# Patient Record
Sex: Male | Born: 1950 | Race: White | Hispanic: No | Marital: Single | Smoking: Current every day smoker
Health system: Southern US, Community
[De-identification: ages and names within clinical notes are randomized; demographics above are authoritative.]

## PROBLEM LIST (undated history)

## (undated) DIAGNOSIS — I429 Cardiomyopathy, unspecified: Secondary | ICD-10-CM

## (undated) DIAGNOSIS — G43909 Migraine, unspecified, not intractable, without status migrainosus: Secondary | ICD-10-CM

## (undated) DIAGNOSIS — I509 Heart failure, unspecified: Secondary | ICD-10-CM

## (undated) DIAGNOSIS — I4891 Unspecified atrial fibrillation: Secondary | ICD-10-CM

## (undated) DIAGNOSIS — I739 Peripheral vascular disease, unspecified: Secondary | ICD-10-CM

## (undated) DIAGNOSIS — J189 Pneumonia, unspecified organism: Secondary | ICD-10-CM

## (undated) HISTORY — DX: Cardiomyopathy, unspecified: I42.9

## (undated) HISTORY — PX: NO PAST SURGERIES: SHX2092

## (undated) HISTORY — DX: Peripheral vascular disease, unspecified: I73.9

---
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
## 2014-07-10 DIAGNOSIS — J189 Pneumonia, unspecified organism: Secondary | ICD-10-CM

## 2014-07-10 HISTORY — DX: Pneumonia, unspecified organism: J18.9

## 2014-08-10 ENCOUNTER — Telehealth: Payer: Self-pay | Admitting: Family Medicine

## 2014-08-13 ENCOUNTER — Encounter: Payer: Self-pay | Admitting: Family Medicine

## 2014-08-13 ENCOUNTER — Encounter (HOSPITAL_COMMUNITY): Payer: Self-pay

## 2014-08-13 ENCOUNTER — Emergency Department (HOSPITAL_COMMUNITY): Payer: BLUE CROSS/BLUE SHIELD

## 2014-08-13 ENCOUNTER — Inpatient Hospital Stay (HOSPITAL_COMMUNITY)
Admission: EM | Admit: 2014-08-13 | Discharge: 2014-08-21 | DRG: 981 | Disposition: A | Discharge status: Home / Self Care | Payer: BLUE CROSS/BLUE SHIELD | Attending: Internal Medicine | Admitting: Internal Medicine

## 2014-08-13 ENCOUNTER — Inpatient Hospital Stay (HOSPITAL_COMMUNITY): Payer: BLUE CROSS/BLUE SHIELD

## 2014-08-13 ENCOUNTER — Ambulatory Visit (INDEPENDENT_AMBULATORY_CARE_PROVIDER_SITE_OTHER): Payer: BLUE CROSS/BLUE SHIELD | Admitting: Family Medicine

## 2014-08-13 VITALS — BP 172/86 | HR 115 | Temp 97.6°F | Ht 69.0 in | Wt 205.0 lb

## 2014-08-13 DIAGNOSIS — I4891 Unspecified atrial fibrillation: Secondary | ICD-10-CM | POA: Diagnosis present

## 2014-08-13 DIAGNOSIS — I499 Cardiac arrhythmia, unspecified: Secondary | ICD-10-CM

## 2014-08-13 DIAGNOSIS — R739 Hyperglycemia, unspecified: Secondary | ICD-10-CM | POA: Diagnosis present

## 2014-08-13 DIAGNOSIS — J189 Pneumonia, unspecified organism: Secondary | ICD-10-CM | POA: Diagnosis present

## 2014-08-13 DIAGNOSIS — I2510 Atherosclerotic heart disease of native coronary artery without angina pectoris: Secondary | ICD-10-CM | POA: Diagnosis present

## 2014-08-13 DIAGNOSIS — J441 Chronic obstructive pulmonary disease with (acute) exacerbation: Secondary | ICD-10-CM | POA: Diagnosis present

## 2014-08-13 DIAGNOSIS — J90 Pleural effusion, not elsewhere classified: Secondary | ICD-10-CM | POA: Diagnosis present

## 2014-08-13 DIAGNOSIS — Z720 Tobacco use: Secondary | ICD-10-CM

## 2014-08-13 DIAGNOSIS — E876 Hypokalemia: Secondary | ICD-10-CM | POA: Diagnosis not present

## 2014-08-13 DIAGNOSIS — I5021 Acute systolic (congestive) heart failure: Secondary | ICD-10-CM | POA: Diagnosis present

## 2014-08-13 DIAGNOSIS — R600 Localized edema: Secondary | ICD-10-CM | POA: Diagnosis present

## 2014-08-13 DIAGNOSIS — Z791 Long term (current) use of non-steroidal anti-inflammatories (NSAID): Secondary | ICD-10-CM

## 2014-08-13 DIAGNOSIS — J9601 Acute respiratory failure with hypoxia: Secondary | ICD-10-CM | POA: Diagnosis present

## 2014-08-13 DIAGNOSIS — Z79899 Other long term (current) drug therapy: Secondary | ICD-10-CM | POA: Diagnosis not present

## 2014-08-13 DIAGNOSIS — Z8249 Family history of ischemic heart disease and other diseases of the circulatory system: Secondary | ICD-10-CM

## 2014-08-13 DIAGNOSIS — I509 Heart failure, unspecified: Secondary | ICD-10-CM | POA: Diagnosis not present

## 2014-08-13 DIAGNOSIS — T502X5A Adverse effect of carbonic-anhydrase inhibitors, benzothiadiazides and other diuretics, initial encounter: Secondary | ICD-10-CM | POA: Diagnosis not present

## 2014-08-13 DIAGNOSIS — I5023 Acute on chronic systolic (congestive) heart failure: Secondary | ICD-10-CM | POA: Diagnosis present

## 2014-08-13 DIAGNOSIS — Z9889 Other specified postprocedural states: Secondary | ICD-10-CM

## 2014-08-13 DIAGNOSIS — F17210 Nicotine dependence, cigarettes, uncomplicated: Secondary | ICD-10-CM | POA: Diagnosis present

## 2014-08-13 DIAGNOSIS — F17200 Nicotine dependence, unspecified, uncomplicated: Secondary | ICD-10-CM | POA: Diagnosis present

## 2014-08-13 HISTORY — DX: Pneumonia, unspecified organism: J18.9

## 2014-08-13 HISTORY — DX: Migraine, unspecified, not intractable, without status migrainosus: G43.909

## 2014-08-13 HISTORY — DX: Unspecified atrial fibrillation: I48.91

## 2014-08-13 HISTORY — DX: Heart failure, unspecified: I50.9

## 2014-08-13 LAB — COMPREHENSIVE METABOLIC PANEL
ALBUMIN: 2.6 g/dL — AB (ref 3.5–5.2)
ALT: 41 U/L (ref 0–53)
AST: 38 U/L — ABNORMAL HIGH (ref 0–37)
Alkaline Phosphatase: 76 U/L (ref 39–117)
Anion gap: 6 (ref 5–15)
BUN: 13 mg/dL (ref 6–23)
CHLORIDE: 100 mmol/L (ref 96–112)
CO2: 31 mmol/L (ref 19–32)
CREATININE: 0.84 mg/dL (ref 0.50–1.35)
Calcium: 8.5 mg/dL (ref 8.4–10.5)
GFR calc Af Amer: >90 mL/min (ref >90)
Glucose, Bld: 94 mg/dL (ref 70–99)
POTASSIUM: 4.4 mmol/L (ref 3.5–5.1)
Sodium: 137 mmol/L (ref 135–145)
TOTAL PROTEIN: 6.7 g/dL (ref 6.0–8.3)
Total Bilirubin: 0.8 mg/dL (ref 0.3–1.2)

## 2014-08-13 LAB — URINALYSIS, ROUTINE W REFLEX MICROSCOPIC
Bilirubin Urine: NEGATIVE
Glucose, UA: NEGATIVE mg/dL
Hgb urine dipstick: NEGATIVE
KETONES UR: NEGATIVE mg/dL
Leukocytes, UA: NEGATIVE
Nitrite: NEGATIVE
Protein, ur: NEGATIVE mg/dL
SPECIFIC GRAVITY, URINE: 1.01 (ref 1.005–1.030)
Urobilinogen, UA: 0.2 mg/dL (ref 0.0–1.0)
pH: 7.5 (ref 5.0–8.0)

## 2014-08-13 LAB — CBC WITH DIFFERENTIAL/PLATELET
BASOS ABS: 0 10*3/uL (ref 0.0–0.1)
Basophils Relative: 0 % (ref 0–1)
EOS ABS: 0.2 10*3/uL (ref 0.0–0.7)
Eosinophils Relative: 2 % (ref 0–5)
HCT: 44.5 % (ref 39.0–52.0)
Hemoglobin: 14.1 g/dL (ref 13.0–17.0)
Lymphocytes Relative: 13 % (ref 12–46)
Lymphs Abs: 1.4 10*3/uL (ref 0.7–4.0)
MCH: 31.2 pg (ref 26.0–34.0)
MCHC: 31.7 g/dL (ref 30.0–36.0)
MCV: 98.5 fL (ref 78.0–100.0)
MONO ABS: 1.1 10*3/uL — AB (ref 0.1–1.0)
Monocytes Relative: 10 % (ref 3–12)
Neutro Abs: 8.2 10*3/uL — ABNORMAL HIGH (ref 1.7–7.7)
Neutrophils Relative %: 75 % (ref 43–77)
Platelets: 260 10*3/uL (ref 150–400)
RBC: 4.52 MIL/uL (ref 4.22–5.81)
RDW: 12.8 % (ref 11.5–15.5)
WBC: 10.9 10*3/uL — ABNORMAL HIGH (ref 4.0–10.5)

## 2014-08-13 LAB — MRSA PCR SCREENING: MRSA by PCR: NEGATIVE

## 2014-08-13 LAB — HEPARIN LEVEL (UNFRACTIONATED): Heparin Unfractionated: 0.33 IU/mL (ref 0.30–0.70)

## 2014-08-13 LAB — STREP PNEUMONIAE URINARY ANTIGEN: STREP PNEUMO URINARY ANTIGEN: NEGATIVE

## 2014-08-13 LAB — CREATININE, SERUM
CREATININE: 0.92 mg/dL (ref 0.50–1.35)
GFR calc Af Amer: >90 mL/min (ref >90)
GFR calc non Af Amer: 89 mL/min — ABNORMAL LOW (ref >90)

## 2014-08-13 LAB — CBC
HCT: 45.1 % (ref 39.0–52.0)
Hemoglobin: 14.3 g/dL (ref 13.0–17.0)
MCH: 31 pg (ref 26.0–34.0)
MCHC: 31.7 g/dL (ref 30.0–36.0)
MCV: 97.8 fL (ref 78.0–100.0)
PLATELETS: 244 10*3/uL (ref 150–400)
RBC: 4.61 MIL/uL (ref 4.22–5.81)
RDW: 12.8 % (ref 11.5–15.5)
WBC: 10.7 10*3/uL — ABNORMAL HIGH (ref 4.0–10.5)

## 2014-08-13 LAB — I-STAT CG4 LACTIC ACID, ED
LACTIC ACID, VENOUS: 1.07 mmol/L (ref 0.5–2.0)
Lactic Acid, Venous: 1.46 mmol/L (ref 0.5–2.0)

## 2014-08-13 LAB — BRAIN NATRIURETIC PEPTIDE: B Natriuretic Peptide: 1223.4 pg/mL — ABNORMAL HIGH (ref 0.0–100.0)

## 2014-08-13 LAB — TSH: TSH: 3.034 u[IU]/mL (ref 0.350–4.500)

## 2014-08-13 LAB — PROCALCITONIN: Procalcitonin: <0.1 ng/mL

## 2014-08-13 MED ORDER — DILTIAZEM HCL 100 MG IV SOLR
5.0000 mg/h | INTRAVENOUS | Status: DC
  Administered 2014-08-13: 12.5 mg/h via INTRAVENOUS
  Administered 2014-08-14 (×2): 10 mg/h via INTRAVENOUS
  Filled 2014-08-13: qty 100

## 2014-08-13 MED ORDER — AZITHROMYCIN 500 MG PO TABS
500.0000 mg | ORAL_TABLET | ORAL | Status: DC
  Administered 2014-08-14: 500 mg via ORAL
  Filled 2014-08-13: qty 1

## 2014-08-13 MED ORDER — DEXTROSE 5 % IV SOLN: 1.0000 g | INTRAVENOUS | Status: DC

## 2014-08-13 MED ORDER — FUROSEMIDE 10 MG/ML IJ SOLN
40.0000 mg | Freq: Once | INTRAMUSCULAR | Status: AC
  Administered 2014-08-14: 40 mg via INTRAVENOUS

## 2014-08-13 MED ORDER — VANCOMYCIN HCL IN DEXTROSE 1-5 GM/200ML-% IV SOLN
1000.0000 mg | Freq: Three times a day (TID) | INTRAVENOUS | Status: DC
  Administered 2014-08-13 – 2014-08-21 (×22): 1000 mg via INTRAVENOUS
  Filled 2014-08-13 (×28): qty 200

## 2014-08-13 MED ORDER — DEXTROSE 5 % IV SOLN
500.0000 mg | Freq: Once | INTRAVENOUS | Status: AC
  Administered 2014-08-13: 500 mg via INTRAVENOUS
  Filled 2014-08-13: qty 500

## 2014-08-13 MED ORDER — IOHEXOL 300 MG/ML  SOLN
100.0000 mL | Freq: Once | INTRAMUSCULAR | Status: AC | PRN
  Administered 2014-08-13: 100 mL via INTRAVENOUS

## 2014-08-13 MED ORDER — DEXTROSE 5 % IV SOLN
1.0000 g | Freq: Once | INTRAVENOUS | Status: AC
  Administered 2014-08-13: 1 g via INTRAVENOUS
  Filled 2014-08-13: qty 10

## 2014-08-13 MED ORDER — DEXTROSE 5 % IV SOLN: 500.0000 mg | INTRAVENOUS | Status: DC

## 2014-08-13 MED ORDER — HEPARIN SODIUM (PORCINE) 5000 UNIT/ML IJ SOLN: 5000.0000 [IU] | Freq: Three times a day (TID) | INTRAMUSCULAR | Status: DC

## 2014-08-13 MED ORDER — DEXTROSE 5 % IV SOLN
5.0000 mg/h | Freq: Once | INTRAVENOUS | Status: AC
  Administered 2014-08-13: 5 mg/h via INTRAVENOUS

## 2014-08-13 MED ORDER — FUROSEMIDE 10 MG/ML IJ SOLN
40.0000 mg | Freq: Two times a day (BID) | INTRAMUSCULAR | Status: DC
  Administered 2014-08-14 – 2014-08-15 (×2): 40 mg via INTRAVENOUS
  Filled 2014-08-13 (×5): qty 4

## 2014-08-13 MED ORDER — SODIUM CHLORIDE 0.9 % IV SOLN
INTRAVENOUS | Status: DC
  Administered 2014-08-13 – 2014-08-14 (×2): via INTRAVENOUS

## 2014-08-13 MED ORDER — HEPARIN BOLUS VIA INFUSION
4000.0000 [IU] | Freq: Once | INTRAVENOUS | Status: AC
  Administered 2014-08-13: 4000 [IU] via INTRAVENOUS
  Filled 2014-08-13: qty 4000

## 2014-08-13 MED ORDER — HEPARIN (PORCINE) IN NACL 100-0.45 UNIT/ML-% IJ SOLN
1400.0000 [IU]/h | INTRAMUSCULAR | Status: DC
  Administered 2014-08-13 (×2): 1300 [IU]/h via INTRAVENOUS
  Administered 2014-08-14: 1550 [IU]/h via INTRAVENOUS
  Administered 2014-08-15 (×2): 1650 [IU]/h via INTRAVENOUS
  Administered 2014-08-16: 1500 [IU]/h via INTRAVENOUS
  Administered 2014-08-17: 1400 [IU]/h via INTRAVENOUS
  Filled 2014-08-13 (×11): qty 250

## 2014-08-13 MED ORDER — PIPERACILLIN-TAZOBACTAM 3.375 G IVPB 30 MIN
3.3750 g | Freq: Once | INTRAVENOUS | Status: AC
  Administered 2014-08-13: 3.375 g via INTRAVENOUS
  Filled 2014-08-13: qty 50

## 2014-08-13 MED ORDER — METHYLPREDNISOLONE SODIUM SUCC 125 MG IJ SOLR
60.0000 mg | Freq: Three times a day (TID) | INTRAMUSCULAR | Status: DC
  Administered 2014-08-13 – 2014-08-14 (×4): 60 mg via INTRAVENOUS
  Filled 2014-08-13: qty 0.96
  Filled 2014-08-13: qty 2
  Filled 2014-08-13: qty 0.96
  Filled 2014-08-13: qty 2
  Filled 2014-08-13: qty 0.96
  Filled 2014-08-13: qty 2

## 2014-08-13 MED ORDER — DILTIAZEM HCL 25 MG/5ML IV SOLN
20.0000 mg | Freq: Once | INTRAVENOUS | Status: AC
  Administered 2014-08-13: 20 mg via INTRAVENOUS

## 2014-08-13 MED ORDER — VANCOMYCIN HCL IN DEXTROSE 1-5 GM/200ML-% IV SOLN
1000.0000 mg | Freq: Once | INTRAVENOUS | Status: AC
  Administered 2014-08-13: 1000 mg via INTRAVENOUS
  Filled 2014-08-13: qty 200

## 2014-08-13 MED ORDER — NICOTINE 21 MG/24HR TD PT24
21.0000 mg | MEDICATED_PATCH | Freq: Every day | TRANSDERMAL | Status: DC
  Administered 2014-08-14: 21 mg via TRANSDERMAL
  Filled 2014-08-13 (×8): qty 1

## 2014-08-13 MED ORDER — CEFTRIAXONE SODIUM IN DEXTROSE 20 MG/ML IV SOLN
1.0000 g | INTRAVENOUS | Status: DC
  Administered 2014-08-14: 1 g via INTRAVENOUS
  Filled 2014-08-13: qty 50

## 2014-08-13 MED ORDER — LEVALBUTEROL HCL 0.63 MG/3ML IN NEBU
0.6300 mg | INHALATION_SOLUTION | Freq: Three times a day (TID) | RESPIRATORY_TRACT | Status: DC
  Administered 2014-08-13 – 2014-08-14 (×4): 0.63 mg via RESPIRATORY_TRACT
  Filled 2014-08-13 (×9): qty 3

## 2014-08-13 MED ORDER — PIPERACILLIN-TAZOBACTAM 3.375 G IVPB
3.3750 g | Freq: Three times a day (TID) | INTRAVENOUS | Status: DC
  Administered 2014-08-13 – 2014-08-21 (×21): 3.375 g via INTRAVENOUS
  Filled 2014-08-13 (×28): qty 50

## 2014-08-13 NOTE — ED Notes (Signed)
Pt [facility] via EMS [facility] related to new onset atrial fibrillation. Pt states increase in SOB, and edema to legs x1 week. Currently under tx for pneumonia.

## 2014-08-13 NOTE — ED Notes (Signed)
Radiology came for vascular study, admitting MD at bedside.  Radiology informed this RN that study will be rescheduled.

## 2014-08-13 NOTE — Consult Note (Signed)
Cardiologist:  New Reason for Consult: CHF, Afib Referring Physician: ER  Jason Spencer is an 64 y.o. male.  HPI:   The patient is a 64 yo male with a history of tobacco abuse since age 69.  He works at [employer] in [place]. He not seen a physician in years until recently.  He reports having a,"hard time breathing",feeling poorly, wheezing, cough for about a month.  He's had worsening LEE but denies orthopnea, PND, N, V, fever, chills, dizziness, CP, Abd pain, hematochezia, melena.  He was treated at Urgent care with Levaquin, steroids and albuterol.  His mother has a [employer] heart valve.    No home meds prior to being seen at Urgent Care.    PMH:  None  FH:  Mother- St Jude heart valve.  Father deceased 2001-COPD  Social History:  reports that he has been smoking Cigarettes.  He has been smoking about 0.50 packs per day. He does not have any smokeless tobacco history on file. He reports that he drinks alcohol. He reports that he does not use illicit drugs.  Allergies: No Known Allergies  Medications: Prior to Admission medications   Medication Sig Start Date End Date Taking? Authorizing Provider  Albuterol Sulfate (PROAIR HFA IN) Inhale 2 puffs into the lungs 4 (four) times daily.    Yes Historical Provider, MD  ibuprofen (ADVIL,MOTRIN) 200 MG tablet Take 400 mg by mouth daily as needed for mild pain or moderate pain.   Yes Historical Provider, MD  levofloxacin (LEVAQUIN) 500 MG tablet Take 500 mg by mouth 2 (two) times daily.   Yes Historical Provider, MD  Menthol, Topical Analgesic, (ICY HOT EX) Apply 1 application topically daily as needed (knee pain).   Yes Historical Provider, MD  Tetrahydrozoline HCl (VISINE OP) Apply 2 drops to eye daily as needed (red eyes).   Yes Historical Provider, MD     Results for orders placed or performed during the hospital encounter of 08/13/14 (from the past 48 hour(s))  CBC WITH DIFFERENTIAL     Status: Abnormal   Collection Time: 08/13/14  12:55 PM  Result Value Ref Range   WBC 10.9 (H) 4.0 - 10.5 K/uL   RBC 4.52 4.22 - 5.81 MIL/uL   Hemoglobin 14.1 13.0 - 17.0 g/dL   HCT 44.5 39.0 - 52.0 %   MCV 98.5 78.0 - 100.0 fL   MCH 31.2 26.0 - 34.0 pg   MCHC 31.7 30.0 - 36.0 g/dL   RDW 12.8 11.5 - 15.5 %   Platelets 260 150 - 400 K/uL   Neutrophils Relative % 75 43 - 77 %   Neutro Abs 8.2 (H) 1.7 - 7.7 K/uL   Lymphocytes Relative 13 12 - 46 %   Lymphs Abs 1.4 0.7 - 4.0 K/uL   Monocytes Relative 10 3 - 12 %   Monocytes Absolute 1.1 (H) 0.1 - 1.0 K/uL   Eosinophils Relative 2 0 - 5 %   Eosinophils Absolute 0.2 0.0 - 0.7 K/uL   Basophils Relative 0 0 - 1 %   Basophils Absolute 0.0 0.0 - 0.1 K/uL  Comprehensive metabolic panel     Status: Abnormal   Collection Time: 08/13/14 12:55 PM  Result Value Ref Range   Sodium 137 135 - 145 mmol/L   Potassium 4.4 3.5 - 5.1 mmol/L   Chloride 100 96 - 112 mmol/L   CO2 31 19 - 32 mmol/L   Glucose, Bld 94 70 - 99 mg/dL   BUN 13  6 - 23 mg/dL   Creatinine, Ser 0.84 0.50 - 1.35 mg/dL   Calcium 8.5 8.4 - 10.5 mg/dL   Total Protein 6.7 6.0 - 8.3 g/dL   Albumin 2.6 (L) 3.5 - 5.2 g/dL   AST 38 (H) 0 - 37 U/L   ALT 41 0 - 53 U/L   Alkaline Phosphatase 76 39 - 117 U/L   Total Bilirubin 0.8 0.3 - 1.2 mg/dL   GFR calc non Af Amer >90 >90 mL/min   GFR calc Af Amer >90 >90 mL/min    Comment: (NOTE) The eGFR has been calculated using the CKD EPI equation. This calculation has not been validated in all clinical situations. eGFR's persistently <90 mL/min signify possible Chronic Kidney Disease.    Anion gap 6 5 - 15  I-Stat CG4 Lactic Acid, ED (not at [facility])     Status: None   Collection Time: 08/13/14  1:16 PM  Result Value Ref Range   Lactic Acid, Venous 1.07 0.5 - 2.0 mmol/L  Urinalysis, Routine w reflex microscopic     Status: None   Collection Time: 08/13/14  1:30 PM  Result Value Ref Range   Color, Urine YELLOW YELLOW   APPearance CLEAR CLEAR   Specific Gravity, Urine 1.010 1.005 -  1.030   pH 7.5 5.0 - 8.0   Glucose, UA NEGATIVE NEGATIVE mg/dL   Hgb urine dipstick NEGATIVE NEGATIVE   Bilirubin Urine NEGATIVE NEGATIVE   Ketones, ur NEGATIVE NEGATIVE mg/dL   Protein, ur NEGATIVE NEGATIVE mg/dL   Urobilinogen, UA 0.2 0.0 - 1.0 mg/dL   Nitrite NEGATIVE NEGATIVE   Leukocytes, UA NEGATIVE NEGATIVE    Comment: MICROSCOPIC NOT DONE ON URINES WITH NEGATIVE PROTEIN, BLOOD, LEUKOCYTES, NITRITE, OR GLUCOSE <1000 mg/dL.    Dg Chest Port 1 View  08/13/2014   CLINICAL DATA:  Atrial fibrillation.  EXAM: PORTABLE CHEST - 1 VIEW  COMPARISON:  August 07, 2014.  FINDINGS: Stable cardiomediastinal silhouette. No pneumothorax is noted. Stable pleural opacity is seen along right lateral chest wall most consistent with loculated effusion, but mass cannot be excluded. Fluid is also noted in the right minor fissure. Increased left perihilar opacity is noted concerning for pneumonia or possibly edema. Bony thorax is intact.  IMPRESSION: Increased left perihilar opacity is noted concerning for pneumonia or possibly edema.  Stable pleural-based opacity is noted along right lateral chest wall most consistent with loculated pleural effusion, with probable loculated fluid within the right minor fissure is well. However, CT scan of the chest is recommended to rule out pleural-based mass.   Electronically Signed   By: Sabino Dick M.D.   On: 08/13/2014 14:00    Review of Systems  Constitutional: Negative for fever and diaphoresis.  HENT: Negative for congestion.   Respiratory: Positive for cough, shortness of breath and wheezing.   Cardiovascular: Positive for leg swelling. Negative for chest pain, palpitations, orthopnea and PND.  Gastrointestinal: Negative for nausea, vomiting, abdominal pain, blood in stool and melena.  Musculoskeletal: Negative for myalgias.  Neurological: Negative for dizziness.  All other systems reviewed and are negative.  Blood pressure 136/90, pulse 99, temperature 98.1 F  (36.7 C), resp. rate 25, weight 200 lb (90.719 kg), SpO2 97 %. Physical Exam  Nursing note and vitals reviewed. Constitutional: He is oriented to person, place, and time. He appears well-developed and well-nourished. No distress.  HENT:  Head: Normocephalic and atraumatic.  Mouth/Throat: No oropharyngeal exudate.  Eyes: EOM are normal. Pupils are equal, round, and reactive  to light. No scleral icterus.  Neck: Normal range of motion. Neck supple. JVD present.  Cardiovascular: S1 normal and S2 normal.  An irregularly irregular rhythm present. Tachycardia present.   No murmur heard. Pulses:      Radial pulses are 2+ on the right side, and 2+ on the left side.       Dorsalis pedis pulses are 2+ on the right side, and 2+ on the left side.  No carotid bruit  Respiratory: Effort normal. He has wheezes. He has rales.  Prolonged exp phase  GI: Soft. Bowel sounds are normal. Distention: Seems tense. There is no tenderness.  Musculoskeletal: He exhibits edema (3+ Pitting edema in the LE).  Lymphadenopathy:    He has no cervical adenopathy.  Neurological: He is alert and oriented to person, place, and time. He exhibits normal muscle tone.  Skin: Skin is warm.  Psychiatric: He has a normal mood and affect.    Assessment/Plan:    Acute respiratory failure with hypoxia   CAP (community acquired pneumonia)   Atrial fibrillation, new onset w/ RVR   Acute CHF   Bilateral lower extremity edema   Smoking   Prolonged QTc  Plan: Continue IV cardizem for rate control. Rate has improved already.  May need to rethink after echo if EF is reduced significantly.  Avoid BB with current lung disease which could be the trigger for afib.  CHADSVASC is at least 1 for CHF.  Recommend IV heparin for now.  Onset of afib unknown but likely more than 48 hours.  May need TEE/DCCV.  He has some nonspecific TW changes on his EKG.  No complaints of CP, however, given his history of tobacco abuse and new afib, will check  nuclear stress test once clinically improved(treadmill).  CHF may be related to afib, LV dysfunction or both.  He has significant LE and will need diuresis.  Further plan after echo reviewed.  Monitor QTc on azithromycin.  Blood cultures pending.  Xopenex ordered by IM for wheezing.  BP currently controlled.  Continue to monitor.     Tarri Fuller, PA-C 08/13/2014, 3:16 PM   I saw and examined the patient in the emergency room along with Mr. Samara Snide, [place]. I reviewed the chart and available data and agree with his findings, examination and recommendations.  64 year old gentleman with probable hypertension, chronic smoking with really no medical history symptoms because he has not seen a doctor who now presents with A. fib of unknown duration and worsening edema with exertional dyspnea. Rate has been improved with IV diltiazem. My best guess is that he probably has acute onset heart failure with an unknown etiology. Would need to follow-up echocardiogram and make decisions based on those findings. He probably needs an ischemic evaluation, has not had any anginal symptoms so Myoview stress test would be an acceptable option however if the EF is significantly reduced with any regional wall motion and maladies or other concerning findings, perceiving with heart catheterization may also be a warranted course of action. He has atrial fibrillation which in and of itself warrants an ischemic evaluation with the heart failure. I wonder if his A. fib could be an etiology to cause heart failure versus a simple byproduct of the heart failure. He will need to be admitted for initiation of heart failure medications. Currently is on calcium channel blocker drip as we're avoiding beta blockers given his wheezing. He would probably need to be on a beta blocker, which use Toprol over carvedilol for  beta-1 specific activity. Will also likely need afterload reduction, but will need to see where his pressures stabilize out after  controlling heart rate.   Check 2-D echo, based on findings consider invasive versus noninvasive ischemic evaluation.  Rate control with diltiazem for A. fib, IV heparin and affect regulation until determining if additional procedures performed. -- future rate control would prefer using beta-1 selective beta blocker such as Toprol   Would strongly consider TEE cardioversion given the patient's progressively worsening symptoms unless he is able to be converted overnight with rate control.   Consider adding ARB (to avoid ACE inhibitor with likely pulmonary disease) for afterload reduction   IV diuresis: I have written for twice a day furosemide as he has significant lower extremities edema.  We'll defer treatment of likely COPD type pneumonia exacerbation to the hospitalist service.   Smoking cessation counseling   Leonie Man, M.D., M.S. Interventional Cardiologist   Pager # [phone]

## 2014-08-13 NOTE — H&P (Signed)
[facility] History and Physical                                                                                    Jason Spencer, is a 64 y.o. male  MRN: [id number]   DOB - April 22, 1951  Admit Date - 08/13/2014  Outpatient Primary MD for the patient is Rudi Heap, MD  With History of -  Current tobacco abuse    Denies previous surgical history  in for   Chief Complaint  Patient presents with  . Atrial Fibrillation     HPI Jason Spencer  is a 64 y.o. male, with no significant past medical history except for tobacco abuse stating it has been "years" since he has been to see a physician for complete physical. He was sent to the [facility] emergency department from [facility] family practice. Patient recently seen at an urgent care and was treated for community-acquired pneumonia with Levaquin. Was instructed at that time to establish with a primary care physician. Upon presentation today to the PCP office he was found to have a rapid irregular heart rate and was sent directly to [facility] ER via EMS. Upon arrival to the emergency department patient was found to be in atrial fibrillation with RVR and was bolused and subsequently started on a Cardizem infusion. Heart rate has decreased from 137 to 108 but he remains in atrial fibrillation. Chest x-ray completed in the ER revealed increased left perihilar opacity concerning for pneumonia or possibly edema. There was also a stable pleural-based opacity in the right lateral chest wall most consistent with loculated pleural effusion with CT of the chest recommended to rule out pleural-based mass. Patient had mild leukocytosis and was given empiric antibiotics to cover for pneumonia. In further questioning of this patient he has had upper respiratory symptoms for at least 1 month to 1-1/2 months that he presumed was bronchitis. He initially took some leftover Zithromax from a friend. He was seen at the urgent care on the 29th  and was started on antibiotics with some improvement in his symptoms. Unfortunately for the past several weeks, possibly longer, he has noticed some increasing lower extremity edema, orthopnea and mild dyspnea on exertion. He has noticed the edema has worsened over the past 48 hours. When his symptoms initially began he had a productive cough of yellow to brown sputum. And also had subjective fevers and nocturnal diaphoresis which resolved in less than 24 hours and has not recurred. He has had no awareness of any palpitations.  Review of Systems   In addition to the HPI above: Subjective Fever without chills, No Headache, No changes with Vision or hearing, No problems swallowing food or Liquids, No Chest pain; productive Cough about one week ago, mild dyspnea on exertion and orthopnea; also with progressive lower extremity edema for at least 2 weeks No Abdominal pain, No Nausea or Vomiting, Bowel movements are regular, No Blood in stool or Urine, No dysuria, No new skin rashes or bruises, No new joints pains-aches,  No new weakness, tingling, numbness in any extremity, No recent weight gain or loss, No polyuria, polydypsia or polyphagia, A full 10  point Review of Systems was done, except as stated above, all other Review of Systems were negative.  Social History History  Substance Use Topics  . Smoking status: Current Every Day Smoker -- 0.50 packs/day    Types: Cigarettes  . Smokeless tobacco: None  . Alcohol Use: 0.0 oz/week           Denies illicit drugs such as marijuana or cocaine  Family History Mother with prior heart valve replacement as well as chronic hypoxemia secondary to CHF Father deceased with COPD, positive MI at age 62   Prior to Admission medications   Medication Sig Start Date End Date Taking? Authorizing Provider  Albuterol Sulfate (PROAIR HFA IN) Inhale 2 puffs into the lungs 4 (four) times daily.    Yes Historical Provider, MD  ibuprofen (ADVIL,MOTRIN) 200 MG  tablet Take 400 mg by mouth daily as needed for mild pain or moderate pain.   Yes Historical Provider, MD  levofloxacin (LEVAQUIN) 500 MG tablet Take 500 mg by mouth 2 (two) times daily.   Yes Historical Provider, MD  Menthol, Topical Analgesic, (ICY HOT EX) Apply 1 application topically daily as needed (knee pain).   Yes Historical Provider, MD  Tetrahydrozoline HCl (VISINE OP) Apply 2 drops to eye daily as needed (red eyes).   Yes Historical Provider, MD    No Known Allergies  Physical Exam  Vitals  Blood pressure 111/85, pulse 55, temperature 98.1 F (36.7 C), resp. rate 29, weight 200 lb (90.719 kg), SpO2 93 %.   General:  Sitting upright in bed in NAD  Psych:  Normal affect and insight, Not Suicidal or Homicidal, Awake Alert, Oriented X 3.  Neuro:   No F.N deficits, ALL C.Nerves Intact, Strength 5/5 all 4 extremities, Sensation intact all 4 extremities.  ENT:  Ears and Eyes appear Normal, Conjunctivae clear, PER. Moist oral mucosa without erythema or exudates.  Neck:  Supple, No lymphadenopathy appreciated  Respiratory:  Symmetrical chest wall movement, Good air movement bilaterally with expiratory rhonchi and scattered wheezing, 2 L nasal cannula  Cardiac:  Irregular with atrial fibrillation, ventricular rates in the low 100s, Cardizem drip infusing, No Murmurs, bilateral LE edema most notable ankle and pedal at 3+, no obvious JVD.    Abdomen:  Positive bowel sounds, Soft, Non tender, Non distended,  No masses appreciated  Skin:  No Cyanosis, Normal Skin Turgor, No Skin Rash or Bruise.  Extremities:  Able to move all 4. 5/5 strength in each,  no effusions.  Data Review  CBC  Recent Labs Lab 08/13/14 1255  WBC 10.9*  HGB 14.1  HCT 44.5  PLT 260  MCV 98.5  MCH 31.2  MCHC 31.7  RDW 12.8  LYMPHSABS 1.4  MONOABS 1.1*  EOSABS 0.2  BASOSABS 0.0    Chemistries   Recent Labs Lab 08/13/14 1255  NA 137  K 4.4  CL 100  CO2 31  GLUCOSE 94  BUN 13    CREATININE 0.84  CALCIUM 8.5  AST 38*  ALT 41  ALKPHOS 76  BILITOT 0.8    estimated creatinine clearance is 102.8 mL/min (by C-G formula based on Cr of 0.84).  No results for input(s): TSH, T4TOTAL, T3FREE, THYROIDAB in the last 72 hours.  Invalid input(s): FREET3  Coagulation profile No results for input(s): INR, PROTIME in the last 168 hours.  No results for input(s): DDIMER in the last 72 hours.  Cardiac Enzymes No results for input(s): CKMB, TROPONINI, MYOGLOBIN in the last 168 hours.  Invalid input(s): CK  Invalid input(s): POCBNP  Urinalysis    Component Value Date/Time   COLORURINE YELLOW 08/13/2014 1330   APPEARANCEUR CLEAR 08/13/2014 1330   LABSPEC 1.010 08/13/2014 1330   PHURINE 7.5 08/13/2014 1330   GLUCOSEU NEGATIVE 08/13/2014 1330   HGBUR NEGATIVE 08/13/2014 1330   BILIRUBINUR NEGATIVE 08/13/2014 1330   KETONESUR NEGATIVE 08/13/2014 1330   PROTEINUR NEGATIVE 08/13/2014 1330   UROBILINOGEN 0.2 08/13/2014 1330   NITRITE NEGATIVE 08/13/2014 1330   LEUKOCYTESUR NEGATIVE 08/13/2014 1330    Imaging results:   Dg Chest Port 1 View  08/13/2014   CLINICAL DATA:  Atrial fibrillation.  EXAM: PORTABLE CHEST - 1 VIEW  COMPARISON:  August 07, 2014.  FINDINGS: Stable cardiomediastinal silhouette. No pneumothorax is noted. Stable pleural opacity is seen along right lateral chest wall most consistent with loculated effusion, but mass cannot be excluded. Fluid is also noted in the right minor fissure. Increased left perihilar opacity is noted concerning for pneumonia or possibly edema. Bony thorax is intact.  IMPRESSION: Increased left perihilar opacity is noted concerning for pneumonia or possibly edema.  Stable pleural-based opacity is noted along right lateral chest wall most consistent with loculated pleural effusion, with probable loculated fluid within the right minor fissure is well. However, CT scan of the chest is recommended to rule out pleural-based mass.    Electronically Signed   By: Roque Lias M.D.   On: 08/13/2014 14:00    My personal review of EKG: Atrial fibrillation with ventricular rate 136 bpm, QTC 505 ms, nonspecific ST changes laterally likely related to rapid ventricular response   Assessment & Plan  Active Problems:   Acute respiratory failure with hypoxia/CAP  -Presumed related to community-acquired pneumonia failed outpatient therapy -Begin Rocephin and Zithromax -Chek Procalcitonin to help differentiate if infectious etiology -Given underlying tobacco abuse likely has a degree of undiagnosed COPD; also concerning for possible pleural mass based on radiologist interpretation of chest x-ray so we'll obtain CT of the chest with contrast -Consult pulmonary medicine -Continue supportive care with oxygen and pulmonary toileting measures -Since has underlying atrial fibrillation we'll utilize Xopenex for bronchodilator -IV Solu-Medrol for underlying wheezing    Atrial fibrillation, new onset w/ RVR -CHADVASc is 0 therefore no indication to pursue anticoagulation at this time -Obtain 2-D echocardiogram to evaluate LV function -Consult cardiology -Continue Cardizem infusion ; likely once rate controlled we'll transition to oral medications -Admit to stepdown unit -Of note QTc mildly prolonged at 505 ms    Bilateral lower extremity edema -Progressive in nature over at least 1-1/2 months according to patient's report -Follow-up on echocardiogram in the event patient has underlying heart failure; since no awareness of tachycardia palpitations could have tachycardia mediated systolic dysfunction -Also need to rule out DVTs to obtain lower extremity venous duplex    Smoking -Counseled regarding cessation -Nicotine patch    DVT Prophylaxis: Subcutaneous heparin  Family Communication:  No family at bedside  Code Status:  Full  Condition:  Stable  Time spent in minutes : 60   Mahmud Keithly L. ANP on 08/13/2014 at 3:04  PM  Between 7am to 7pm - Pager - [phone]  After 7pm go to [email or web address] - password [email or web address]  And look for the night coverage person covering me after hours  [facility]

## 2014-08-13 NOTE — Progress Notes (Addendum)
ANTICOAGULATION CONSULT NOTE - Initial Consult  Pharmacy Consult for Heparin, Vancomycin, and Zosyn Indication: atrial fibrillation, PNA  No Known Allergies  Patient Measurements: Weight: 200 lb (90.719 kg) Heparin Dosing Weight: 89 kg  Vital Signs: Temp: 98.1 F (36.7 C) (02/04 1228) Temp Source: Oral (02/04 1021) BP: 125/88 mmHg (02/04 1545) Pulse Rate: 87 (02/04 1545)  Labs:  Recent Labs  08/13/14 1255  HGB 14.1  HCT 44.5  PLT 260  CREATININE 0.84    Estimated Creatinine Clearance: 102.8 mL/min (by C-G formula based on Cr of 0.84).   Medical History: History reviewed. No pertinent past medical history.  Medications:   (Not in a hospital admission)  Assessment: 64 yo M presents on 2/4 with SOB, wheezing, and cough. Found to be in Afib with RVR at admission, but presently HR controlled. CBC stable. No s/s of bleed.  Goal of Therapy:  Heparin level 0.3-0.7 units/ml Monitor platelets by anticoagulation protocol: Yes   Plan:  Give 4000 unit heparin BOLUS Start heparin gtt at 1300 units/hr Check 6 hr HL at 2300 Monitor daily HL, CBC, s/s of bleed F/U longterm plans for Afib  Jason Spencer J 08/13/2014,4:56 PM  Pt had recent outpatient failure with Levaquin so will change therapy back to vancomycin and zosyn. Pt received vancomycin and zosyn x 1 earlier today. SCr ok, CrCl ~90-12300ml/min  Plan: Stop Azithromycin and Ceftriaxone Restart zosyn 3.375g IV Q8 Start vancomycin 1g IV Q8 Monitor renal function, clinical picture, VT at Css F/U abx deescalation

## 2014-08-13 NOTE — Progress Notes (Signed)
64 year old male comes in today to follow up on pneumonia and to establish care. Patient was evaluated at Urgent Care 6 days ago and diagnosed with pneumonia. He was given Levaquin, Prednisone, and an inhaler. He was told to follow up with PCP.    Subjective:    Patient ID: Jason Spencer, male    DOB: 19-Dec-1950, 64 y.o.   MRN: [id number]  HPI Patient was seen at the urgent care last week and was dx with Right sided pneumonia and was put on 14 day course of levaquin, prednisone 50mg  po qd x 5 days, albuterol MDI.  He is having SOB, cough, and weakness.  Review of Systems  Constitutional: Negative for fever.  HENT: Negative for ear pain.   Eyes: Negative for discharge.  Respiratory: Negative for cough.   Cardiovascular: Negative for chest pain.  Gastrointestinal: Negative for abdominal distention.  Endocrine: Negative for polyuria.  Genitourinary: Negative for difficulty urinating.  Musculoskeletal: Negative for gait problem and neck pain.  Skin: Negative for color change and rash.  Neurological: Negative for speech difficulty and headaches.  Psychiatric/Behavioral: Negative for agitation.       Objective:    BP 172/86 mmHg  Pulse 115  Temp(Src) 97.6 F (36.4 C) (Oral)  Ht 5\' 9"  (1.753 m)  Wt 205 lb (92.987 kg)  BMI 30.26 kg/m2  SpO2 95% Physical Exam  Constitutional: He is oriented to person, place, and time. He appears well-developed and well-nourished.  HENT:  Head: Normocephalic and atraumatic.  Mouth/Throat: Oropharynx is clear and moist.  Eyes: Pupils are equal, round, and reactive to light.  Neck: Normal range of motion. Neck supple.  Cardiovascular: Normal rate.   No murmur heard. Irregular rate and rhythm and tachy  Pulmonary/Chest: Effort normal. He has wheezes.  Abdominal: Soft. Bowel sounds are normal. There is no tenderness.  Neurological: He is alert and oriented to person, place, and time.  Skin: Skin is warm and dry.  Psychiatric: He has a normal mood  and affect.    EKG - Aflutter/ fibrillation with RVR      Assessment & Plan:     ICD-9-CM ICD-10-CM   1. Irregular heart rate 427.9 I49.9 EKG 12-Lead   Recommend go to ED via EMS  No Follow-up on file.  Deatra CanterWilliam J Oxford FNP

## 2014-08-13 NOTE — Consult Note (Signed)
Name: Jason Spencer MRN: [id number] DOB: Mar 13, 1951    ADMISSION DATE:  08/13/2014 CONSULTATION DATE:  08/13/2014  REFERRING MD :  Thedore Mins  CHIEF COMPLAINT:  CAP, stable pleural opacity  BRIEF PATIENT DESCRIPTION: 64 y.o. M brought to [facility] ED 2/4 after seeing PCP and found to have new onset A.fib vs A.flutter with RVR.  Also has CAP, started on abx 1 week ago but no resolution in symptoms.  CXR in ED revealed stable right sided pleural based opacity concerning for effusion vs mass.  PCCM consulted for further recs.  SIGNIFICANT EVENTS  2/4 - saw PCP in f/u after seeing urgent care 1 week prior, EKG with new onset A.flutter / A.fib, instructed to come to ED for further eval.  STUDIES:  CXR 2/4 >>> increased left perihilar opacity concerning for PNA, stable pleural based opacity in right lateral chest wall most c/w loculated effusion.  Recommend CT chest to r/u mass.   HISTORY OF PRESENT ILLNESS:  Jason Spencer is a 64 y.o. M with no significant PMH.  He is a current smoker (1/2 ppd x 43 years).  He states that it has been quite some time since he has seen a physician, several years now. He was seen at an Urgent Care Office 1 week ago and dx with right sided CAP.  He was given Rx for 14 day course of Levaquin, 5 day course of  Prednisone, and albuterol MDI.  He was instructed to f/u with PCP which he did on 2/4 Jason Pyle, FNP at [facility]).  At this f/u appt, EKG revealed new onset A.fib vs A.flutter with RVR; therefore, he was instructed to go straight to the ED for further evaluation. In ED, it was felt that his rhythm was A.fib with RVR and he was subsequently bolused and started on a Cardizem gtt.  HR was initially 137 and later decreased to 108.  CXR revealed increased left sided opacity as well as a stable pleural-based opacity in the right lateral chest wall concerning for either loculated effusion vs mass.  Radiology had recommended f/u CT chest for further  evaluation.  For the past several weeks, pt has noticed increasing LE edema, orthopnea, and dyspnea on exertion.  He feels LE edema has worsened over past 2 - 3 days.  He initially had a cough productive of yellow to brown sputum, this has subsided some since onset.  He reports that he also initially had subjective fevers and night sweats but these have also subsided.  He denies any recent periods of prolonged immobilizations / travel, hx of DVT / PE, recent surgeries, hemoptysis, known malignancies, known clotting disorders.  Works in a [employer].  Denies exposure to dust or toxic chemicals / substances.   PAST MEDICAL HISTORY :   has no past medical history on file.  has no past surgical history on file. Prior to Admission medications   Medication Sig Start Date End Date Taking? Authorizing Provider  Albuterol Sulfate (PROAIR HFA IN) Inhale 2 puffs into the lungs 4 (four) times daily.    Yes Historical Provider, MD  ibuprofen (ADVIL,MOTRIN) 200 MG tablet Take 400 mg by mouth daily as needed for mild pain or moderate pain.   Yes Historical Provider, MD  levofloxacin (LEVAQUIN) 500 MG tablet Take 500 mg by mouth 2 (two) times daily.   Yes Historical Provider, MD  Menthol, Topical Analgesic, (ICY HOT EX) Apply 1 application topically daily as needed (knee pain).   Yes Historical Provider, MD  Tetrahydrozoline HCl (VISINE OP) Apply 2 drops to eye daily as needed (red eyes).   Yes Historical Provider, MD   No Known Allergies  FAMILY HISTORY:  family history is not on file. SOCIAL HISTORY:  reports that he has been smoking Cigarettes.  He has been smoking about 0.50 packs per day. He does not have any smokeless tobacco history on file. He reports that he drinks alcohol. He reports that he does not use illicit drugs.  REVIEW OF SYSTEMS:   All negative; except for those that are bolded, which indicate positives.  Constitutional: weight loss, weight gain, night sweats, fevers, chills,  fatigue, weakness.  HEENT: headaches, sore throat, sneezing, nasal congestion, post nasal drip, difficulty swallowing, tooth/dental problems, visual complaints, visual changes, ear aches. Neuro: difficulty with speech, weakness, numbness, ataxia. CV:  chest pain, orthopnea, PND, swelling in lower extremities, dizziness, palpitations, syncope.  Resp: cough, hemoptysis, dyspnea, wheezing. GI  heartburn, indigestion, abdominal pain, nausea, vomiting, diarrhea, constipation, change in bowel habits, loss of appetite, hematemesis, melena, hematochezia.  GU: dysuria, change in color of urine, urgency or frequency, flank pain, hematuria. MSK: joint pain or swelling, decreased range of motion. Psych: change in mood or affect, depression, anxiety, suicidal ideations, homicidal ideations. Skin: rash, itching, bruising.   SUBJECTIVE: Does feel that breathing has improved after being started on levaquin as outpatient.  Still not back to baseline.  SOB worse when lying flat.  VITAL SIGNS: Temp:  [97.6 F (36.4 C)-98.1 F (36.7 C)] 98.1 F (36.7 C) (02/04 1228) Pulse Rate:  [55-151] 99 (02/04 1500) Resp:  [15-34] 25 (02/04 1500) BP: (97-172)/(73-120) 136/90 mmHg (02/04 1500) SpO2:  [93 %-98 %] 97 % (02/04 1500) Weight:  [90.719 kg (200 lb)-92.987 kg (205 lb)] 90.719 kg (200 lb) (02/04 1327)  PHYSICAL EXAMINATION: General: WDWN male, sitting up in bed, in NAD. Neuro: A&O x 3, non-focal.  HEENT: [place]/AT. PERRL, sclerae anicteric. Cardiovascular: IRIR, no M/R/G.  Lungs: Respirations even and unlabored.  Coarse BS with diffuse wheezing throughout bilateral lung fields. Abdomen: BS x 4, soft, NT/ND.  Musculoskeletal: No gross deformities, no edema.  Skin: Intact, warm, no rashes.   Recent Labs Lab 08/13/14 1255  NA 137  K 4.4  CL 100  CO2 31  BUN 13  CREATININE 0.84  GLUCOSE 94    Recent Labs Lab 08/13/14 1255  HGB 14.1  HCT 44.5  WBC 10.9*  PLT 260   Dg Chest Port 1  View  08/13/2014   CLINICAL DATA:  Atrial fibrillation.  EXAM: PORTABLE CHEST - 1 VIEW  COMPARISON:  August 07, 2014.  FINDINGS: Stable cardiomediastinal silhouette. No pneumothorax is noted. Stable pleural opacity is seen along right lateral chest wall most consistent with loculated effusion, but mass cannot be excluded. Fluid is also noted in the right minor fissure. Increased left perihilar opacity is noted concerning for pneumonia or possibly edema. Bony thorax is intact.  IMPRESSION: Increased left perihilar opacity is noted concerning for pneumonia or possibly edema.  Stable pleural-based opacity is noted along right lateral chest wall most consistent with loculated pleural effusion, with probable loculated fluid within the right minor fissure is well. However, CT scan of the chest is recommended to rule out pleural-based mass.   Electronically Signed   By: Roque LiasJames  Green M.D.   On: 08/13/2014 14:00    ASSESSMENT / PLAN:  Acute hypoxic respiratory failure CAP - s/p outpatient Levaquin (~ 1 week) Stable right pleural based opacity, favor loculated effusion vs mass R/o  DVT / PE - new worsenening LE edema ? Undiagnosed COPD Tobacco use disorder Recs: Continue supplemental O2 to maintain SpO2 > 92%. Continue BD's, IV steroids. D/c azithro / rocephin given that he has failed outpatient levaquin and given loculated effusions.  Change abx to vanc / zosyn and follow cultures. PCT algorithm. May need to consult CVTS in AM for possible VATS. F/u LE dopplers. PFT's as outpatient. Pulmonary toilet. HIV antibody. Nicotine patch. Tobacco cessation.  New onset A.fib vs A.flutter CHF Recs: Continue diltiazem for rate control. Heparin gtt. Doubt candidate for DCCV given unknown onset of arrhythmia. F/u echo.  Lasix  x 1.  Rest per primary.  Rutherford Guys, [place] [facility] Pgr: [phone]  or [phone] 08/13/2014, 3:41 PM  STAFF NOTE: I, Rory Percy, MD FACP have personally reviewed patient's available data, including medical history, events of note, physical examination and test results as part of my evaluation. I have discussed with resident/NP and other care providers such as pharmacist, RN and RRT. In addition, I personally evaluated patient and elicited key findings of: new fib, per primary, dilt, no distress, no fevers, does not report chills or wt loss, no hemoptysis,did complete short course abx, extensive smoking, pcxr in am , ct reviewed, likely will need CVTS evaluation of loculated lateral effusion, not toxic as of now, add mrsa coverage, add polymicrobial coverage, treat active bronchospasm, steroids, BDers, lasix  Will follow in am    Mcarthur Rossetti. Tyson Alias, MD, FACP Pgr: [phone] [facility] 08/13/2014 8:04 PM

## 2014-08-13 NOTE — Progress Notes (Signed)
Attempted report, ED RN busy.  Eliane DecreeSmith, Jamiaya Bina Moore, RN

## 2014-08-13 NOTE — ED Notes (Signed)
Heart healthy tray ordered 02/04

## 2014-08-14 ENCOUNTER — Inpatient Hospital Stay (HOSPITAL_COMMUNITY): Payer: BLUE CROSS/BLUE SHIELD

## 2014-08-14 ENCOUNTER — Telehealth: Payer: Self-pay | Admitting: Family Medicine

## 2014-08-14 DIAGNOSIS — J90 Pleural effusion, not elsewhere classified: Secondary | ICD-10-CM

## 2014-08-14 DIAGNOSIS — I4891 Unspecified atrial fibrillation: Secondary | ICD-10-CM

## 2014-08-14 DIAGNOSIS — R609 Edema, unspecified: Secondary | ICD-10-CM

## 2014-08-14 LAB — CBC
HCT: 46 % (ref 39.0–52.0)
Hemoglobin: 14.6 g/dL (ref 13.0–17.0)
MCH: 31.1 pg (ref 26.0–34.0)
MCHC: 31.7 g/dL (ref 30.0–36.0)
MCV: 97.9 fL (ref 78.0–100.0)
Platelets: 252 10*3/uL (ref 150–400)
RBC: 4.7 MIL/uL (ref 4.22–5.81)
RDW: 12.7 % (ref 11.5–15.5)
WBC: 9.8 10*3/uL (ref 4.0–10.5)

## 2014-08-14 LAB — LACTATE DEHYDROGENASE, PLEURAL OR PERITONEAL FLUID: LD, Fluid: 67 U/L — ABNORMAL HIGH (ref 3–23)

## 2014-08-14 LAB — LEGIONELLA ANTIGEN, URINE

## 2014-08-14 LAB — LACTATE DEHYDROGENASE: LDH: 188 U/L (ref 94–250)

## 2014-08-14 LAB — LIPID PANEL
CHOLESTEROL: 167 mg/dL (ref 0–200)
HDL: 69 mg/dL (ref >39)
LDL CALC: 89 mg/dL (ref 0–99)
Total CHOL/HDL Ratio: 2.4 RATIO
Triglycerides: 43 mg/dL (ref <150)
VLDL: 9 mg/dL (ref 0–40)

## 2014-08-14 LAB — PROTEIN, BODY FLUID: Total protein, fluid: <3 g/dL

## 2014-08-14 LAB — HEPARIN LEVEL (UNFRACTIONATED)
HEPARIN UNFRACTIONATED: 0.13 [IU]/mL — AB (ref 0.30–0.70)
Heparin Unfractionated: 0.23 IU/mL — ABNORMAL LOW (ref 0.30–0.70)
Heparin Unfractionated: 0.3 IU/mL (ref 0.30–0.70)
Heparin Unfractionated: 0.26 IU/mL — ABNORMAL LOW (ref 0.30–0.70)

## 2014-08-14 LAB — BODY FLUID CELL COUNT WITH DIFFERENTIAL
EOS FL: 1 %
Lymphs, Fluid: 52 %
Monocyte-Macrophage-Serous Fluid: 22 % — ABNORMAL LOW (ref 50–90)
NEUTROPHIL FLUID: 25 % (ref 0–25)
WBC FLUID: 1599 uL — AB (ref 0–1000)

## 2014-08-14 LAB — URINE CULTURE
COLONY COUNT: NO GROWTH
CULTURE: NO GROWTH

## 2014-08-14 LAB — PROTEIN, TOTAL: TOTAL PROTEIN: 6.7 g/dL (ref 6.0–8.3)

## 2014-08-14 LAB — EXPECTORATED SPUTUM ASSESSMENT W REFEX TO RESP CULTURE

## 2014-08-14 LAB — EXPECTORATED SPUTUM ASSESSMENT W GRAM STAIN, RFLX TO RESP C

## 2014-08-14 MED ORDER — METHYLPREDNISOLONE SODIUM SUCC 125 MG IJ SOLR
60.0000 mg | Freq: Two times a day (BID) | INTRAMUSCULAR | Status: DC
  Filled 2014-08-14: qty 0.96

## 2014-08-14 NOTE — Progress Notes (Signed)
Left thoracentesis done by Dr. Grayland OrmondQuaid, tolerated well. Obtained about 730 ml of yellowish output. Specimen sent to the lab. Band aid applied by md.

## 2014-08-14 NOTE — Progress Notes (Signed)
Heparin gtt  placed on hold as per MD for thoracentesis this pm.

## 2014-08-14 NOTE — Telephone Encounter (Signed)
Pt's sister requesting [facility] Note faxed to [phone]

## 2014-08-14 NOTE — Progress Notes (Signed)
Would desats to 80's on room air. o2 3l [place] in use.

## 2014-08-14 NOTE — Progress Notes (Signed)
[facility] TEAM 1 - Stepdown/ICU TEAM Progress Note  Isabella StallingJarrett Goelz JXB:[id number] DOB: [birth year] DOA: 08/13/2014 PCP: Rudi HeapMOORE, DONALD, MD  Admit HPI / Brief Narrative: 64 y.o. Male with no significant past medical history except for tobacco abuse stating it has been "years" since he has been to see a physician [facility]. Patient recently was treated for community-acquired pneumonia with Levaquin. Was instructed at that time to establish with a primary care physician. Upon presentation today to the PCP office he was found to have a rapid irregular heart rate and [facility].   Upon arrival to the emergency department patient was found to be in atrial fibrillation with RVR and was bolused and subsequently started on a Cardizem infusion. Heart rate has decreased from 137 to 108 but he remained in atrial fibrillation. Chest x-ray revealed increased left perihilar opacity concerning for pneumonia or possibly edema. There was also a stable pleural-based opacity in the right lateral chest wall most consistent with loculated pleural effusion. Patient had mild leukocytosis and was given empiric antibiotics to cover for pneumonia. Patient stated he has had upper respiratory symptoms for 1-1/2 months. He initially took some leftover Zithromax from a friend. He was seen at the urgent care on the 29th and was started on antibiotics with some improvement in his symptoms. For several weeks he noticed increasing lower extremity edema, orthopnea and mild dyspnea on exertion. He has noticed the edema has worsened over the past 48 hours.   HPI/Subjective: Pt is resting comfortably in bed.  He denies current cp.  He states he is somewhat sob, but that this is better than at his presentation.  He notes his feet are swollen.  No abdom pain, n/v, or ha.    Assessment/Plan:  Acute hypoxic respiratory failure - CAP +  acute COPD exac +/- CHF  tx each individual issue and complete w/u  R pleural based opacity loculated effusion vs mass - Pulm following in consultation   Probable COPD - Tobacco abuse  Cont usual tx regimen   Newly diagnosed Afib w RVR On heparin gtt and cardizem gtt - Cards following - TSH normal   B Le edema  Suspect pt has CHF - await TTE - eval further as indicated  Code Status: FULL Family Communication: no family present at time of exam Disposition Plan: SDU   [facility]  PCCM  Procedures: 2/4 TTE - pending  2/4 venous dopplers - pending   Antibiotics: Azithro 2/4 > 2/5 Rocephin 2/4 > 2/5 Zosyn 2/4 > Vanc 2/4 >  DVT prophylaxis: IV heparin   Objective: Blood pressure 137/57, pulse 82, temperature 97.3 F (36.3 C), temperature source Oral, resp. rate 18, height 5\' 9"  (1.753 m), weight 90.719 kg (200 lb), SpO2 95 %.  Intake/Output Summary (Last 24 hours) at 08/14/14 1017 Last data filed at 08/14/14 0912  Gross per 24 hour  Intake 1510.11 ml  Output   4275 ml  Net -2764.89 ml    Exam: General: mild resp difficulty but able to complete full sentences  Lungs: poor air movement B bases - mild exp wheeze  Cardiovascular: Regular rate and rhythm without murmur gallop or rub normal S1 and S2 Abdomen: Nontender, nondistended, soft, bowel sounds positive, no rebound, no ascites, no appreciable mass Extremities: No significant cyanosis, clubbing, or edema bilateral lower extremities  Data Reviewed: Basic Metabolic Panel:  Recent Labs Lab 08/13/14  1255 08/13/14 2048  NA 137  --   K 4.4  --   CL 100  --   CO2 31  --   GLUCOSE 94  --   BUN 13  --   CREATININE 0.84 0.92  CALCIUM 8.5  --     Liver Function Tests:  Recent Labs Lab 08/13/14 1255  AST 38*  ALT 41  ALKPHOS 76  BILITOT 0.8  PROT 6.7  ALBUMIN 2.6*   CBC:  Recent Labs Lab 08/13/14 1255 08/13/14 2048 08/14/14 0238  WBC 10.9* 10.7* 9.8  NEUTROABS 8.2*  --   --    HGB 14.1 14.3 14.6  HCT 44.5 45.1 46.0  MCV 98.5 97.8 97.9  PLT 260 244 252   BNP (last 3 results)  Recent Labs  08/13/14 1755  BNP 1223.4*    Recent Results (from the past 240 hour(s))  Blood Culture (routine x 2)     Status: None (Preliminary result)   Collection Time: 08/13/14  1:00 PM  Result Value Ref Range Status   Specimen Description BLOOD LEFT ANTECUBITAL  Final   Special Requests BOTTLES DRAWN AEROBIC AND ANAEROBIC  Final   Culture   Final           BLOOD CULTURE RECEIVED NO GROWTH TO DATE CULTURE WILL BE HELD FOR 5 DAYS BEFORE ISSUING A FINAL NEGATIVE REPORT Performed at [facility]    Report Status PENDING  Incomplete  MRSA PCR Screening     Status: None   Collection Time: 08/13/14  7:18 PM  Result Value Ref Range Status   MRSA by PCR NEGATIVE NEGATIVE Final    Comment:        The GeneXpert MRSA Assay (FDA approved for NASAL specimens only), is one component of a comprehensive MRSA colonization surveillance program. It is not intended to diagnose MRSA infection nor to guide or monitor treatment for MRSA infections.   Culture, sputum-assessment     Status: None   Collection Time: 08/13/14 11:18 PM  Result Value Ref Range Status   Specimen Description SPUTUM  Final   Special Requests NONE  Final   Sputum evaluation   Final    MICROSCOPIC FINDINGS SUGGEST THAT THIS SPECIMEN IS NOT REPRESENTATIVE OF LOWER RESPIRATORY SECRETIONS. PLEASE RECOLLECT. RESULT CALLED TO, READ BACK BY AND VERIFIED WITH: A. WEATHERFORD RN [phone] GREEN R    Report Status 08/14/2014 FINAL  Final     Studies:  Recent x-ray studies have been reviewed in detail by the Attending Physician  Scheduled Meds:  Scheduled Meds: . azithromycin  500 mg Oral Q24H  . cefTRIAXone (ROCEPHIN)  IV  1 g Intravenous Q24H  . furosemide  40 mg Intravenous BID  . levalbuterol  0.63 mg Nebulization Q8H  . methylPREDNISolone (SOLU-MEDROL) injection  60 mg Intravenous 3 times  per day  . nicotine  21 mg Transdermal Daily  . piperacillin-tazobactam (ZOSYN)  IV  3.375 g Intravenous Q8H  . vancomycin  1,000 mg Intravenous Q8H    Time spent on care of this patient: 35 mins   Katrell Milhorn T , MD   [facility] Office  [phone] Pager - Text Page per Loretha Stapler as per below:  On-Call/Text Page:      [email or web address]      password [email or web address]  If 7PM-7AM, please contact night-coverage [email or web address] Password [email or web address] 08/14/2014, 10:17 AM   LOS: 1 day

## 2014-08-14 NOTE — Consult Note (Signed)
Reason for Consult:Loculated right pleural effusion Referring Physician: Dr. Carie Caddy Weinand is an 64 y.o. male.  HPI: 64 yo male with a history of tobacco abuse who was admitted with new atrial fibrillation.  He says he was ill back in November with what he thought was bronchitis. He got over that and was doing well until 7-10 days ago. He says he had a productive cough and fever. He was seen at an urgent care and started on antibiotics for pneumonia. He continued to feel poorly and over the next few days noticed increased swelling in his legs. He denies chest pain or pressure. He did feel short of breath when he tried to lie down. 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 She sent him to an urgent care. They noted he was in rapid atrial fibrillation and transferred him to [facility].  He was in rapid a fib and was started on a Cardizem drip. He also was started on a heparin drip. A CT angiogram was done to rule out PE. It was negative for PE but did show a loculated right pleural effusion with calcification. There were left upper and lower lobe infiltrates and a layering left effusion.  Today he had a left thoracentesis draining ~ 750 ml of fluid.  He has not had any recent trauma to the right chest but says he did break a rib a long time ago- "sometime in the 47s." He does not have any known asbestos exposure.   Past Medical History  Diagnosis Date  . Atrial fibrillation with RVR 08/13/2014    new onset/notes 08/13/2014  . CAP (community acquired pneumonia) 07/2014  . Acute CHF     Archie Endo 08/13/2014  . Migraine 1970     "don't know why; they just left and haven't come back"    Past Surgical History  Procedure Laterality Date  . No past surgeries      History reviewed. No pertinent family history.  Social History:  reports that he has been smoking Cigarettes.  He has a 22 pack-year smoking history. He has never used smokeless tobacco. He reports that he drinks alcohol. He  reports that he does not use illicit drugs.  Allergies: No Known Allergies  Medications:  Scheduled: . furosemide  40 mg Intravenous BID  . levalbuterol  0.63 mg Nebulization Q8H  . [START ON 08/15/2014] methylPREDNISolone (SOLU-MEDROL) injection  60 mg Intravenous Q12H  . nicotine  21 mg Transdermal Daily  . piperacillin-tazobactam (ZOSYN)  IV  3.375 g Intravenous Q8H  . vancomycin  1,000 mg Intravenous Q8H    Results for orders placed or performed during the hospital encounter of 08/13/14 (from the past 48 hour(s))  CBC WITH DIFFERENTIAL     Status: Abnormal   Collection Time: 08/13/14 12:55 PM  Result Value Ref Range   WBC 10.9 (H) 4.0 - 10.5 K/uL   RBC 4.52 4.22 - 5.81 MIL/uL   Hemoglobin 14.1 13.0 - 17.0 g/dL   HCT 44.5 39.0 - 52.0 %   MCV 98.5 78.0 - 100.0 fL   MCH 31.2 26.0 - 34.0 pg   MCHC 31.7 30.0 - 36.0 g/dL   RDW 12.8 11.5 - 15.5 %   Platelets 260 150 - 400 K/uL   Neutrophils Relative % 75 43 - 77 %   Neutro Abs 8.2 (H) 1.7 - 7.7 K/uL   Lymphocytes Relative 13 12 - 46 %   Lymphs Abs 1.4 0.7 - 4.0 K/uL  Monocytes Relative 10 3 - 12 %   Monocytes Absolute 1.1 (H) 0.1 - 1.0 K/uL   Eosinophils Relative 2 0 - 5 %   Eosinophils Absolute 0.2 0.0 - 0.7 K/uL   Basophils Relative 0 0 - 1 %   Basophils Absolute 0.0 0.0 - 0.1 K/uL  Comprehensive metabolic panel     Status: Abnormal   Collection Time: 08/13/14 12:55 PM  Result Value Ref Range   Sodium 137 135 - 145 mmol/L   Potassium 4.4 3.5 - 5.1 mmol/L   Chloride 100 96 - 112 mmol/L   CO2 31 19 - 32 mmol/L   Glucose, Bld 94 70 - 99 mg/dL   BUN 13 6 - 23 mg/dL   Creatinine, Ser 0.84 0.50 - 1.35 mg/dL   Calcium 8.5 8.4 - 10.5 mg/dL   Total Protein 6.7 6.0 - 8.3 g/dL   Albumin 2.6 (L) 3.5 - 5.2 g/dL   AST 38 (H) 0 - 37 U/L   ALT 41 0 - 53 U/L   Alkaline Phosphatase 76 39 - 117 U/L   Total Bilirubin 0.8 0.3 - 1.2 mg/dL   GFR calc non Af Amer >90 >90 mL/min   GFR calc Af Amer >90 >90 mL/min    Comment: (NOTE) The  eGFR has been calculated using the CKD EPI equation. This calculation has not been validated in all clinical situations. eGFR's persistently <90 mL/min signify possible Chronic Kidney Disease.    Anion gap 6 5 - 15  Blood Culture (routine x 2)     Status: None (Preliminary result)   Collection Time: 08/13/14  1:00 PM  Result Value Ref Range   Specimen Description BLOOD LEFT ANTECUBITAL    Special Requests BOTTLES DRAWN AEROBIC AND ANAEROBIC 10MLS    Culture             BLOOD CULTURE RECEIVED NO GROWTH TO DATE CULTURE WILL BE HELD FOR 5 DAYS BEFORE ISSUING A FINAL NEGATIVE REPORT Performed at [facility]    Report Status PENDING   I-Stat CG4 Lactic Acid, ED (not at [facility])     Status: None   Collection Time: 08/13/14  1:16 PM  Result Value Ref Range   Lactic Acid, Venous 1.07 0.5 - 2.0 mmol/L  Urinalysis, Routine w reflex microscopic     Status: None   Collection Time: 08/13/14  1:30 PM  Result Value Ref Range   Color, Urine YELLOW YELLOW   APPearance CLEAR CLEAR   Specific Gravity, Urine 1.010 1.005 - 1.030   pH 7.5 5.0 - 8.0   Glucose, UA NEGATIVE NEGATIVE mg/dL   Hgb urine dipstick NEGATIVE NEGATIVE   Bilirubin Urine NEGATIVE NEGATIVE   Ketones, ur NEGATIVE NEGATIVE mg/dL   Protein, ur NEGATIVE NEGATIVE mg/dL   Urobilinogen, UA 0.2 0.0 - 1.0 mg/dL   Nitrite NEGATIVE NEGATIVE   Leukocytes, UA NEGATIVE NEGATIVE    Comment: MICROSCOPIC NOT DONE ON URINES WITH NEGATIVE PROTEIN, BLOOD, LEUKOCYTES, NITRITE, OR GLUCOSE <1000 mg/dL.  Urine culture     Status: None   Collection Time: 08/13/14  1:30 PM  Result Value Ref Range   Specimen Description URINE, CLEAN CATCH    Special Requests NONE    Colony Count NO GROWTH Performed at [facility]     Culture NO GROWTH Performed at [facility]     Report Status 08/14/2014 FINAL   Brain natriuretic peptide     Status: Abnormal   Collection Time: 08/13/14  5:55 PM  Result Value Ref Range   B Natriuretic  Peptide 1223.4 (H) 0.0 - 100.0 pg/mL  Procalcitonin - Baseline     Status: None   Collection Time: 08/13/14  5:55 PM  Result Value Ref Range   Procalcitonin <0.10 ng/mL    Comment:        Interpretation: PCT (Procalcitonin) <= 0.5 ng/mL: Systemic infection (sepsis) is not likely. Local bacterial infection is possible. (NOTE)         ICU PCT Algorithm               Non ICU PCT Algorithm    ----------------------------     ------------------------------         PCT < 0.25 ng/mL                 PCT < 0.1 ng/mL     Stopping of antibiotics            Stopping of antibiotics       strongly encouraged.               strongly encouraged.    ----------------------------     ------------------------------       PCT level decrease by               PCT < 0.25 ng/mL       >= 80% from peak PCT       OR PCT 0.25 - 0.5 ng/mL          Stopping of antibiotics                                             encouraged.     Stopping of antibiotics           encouraged.    ----------------------------     ------------------------------       PCT level decrease by              PCT >= 0.25 ng/mL       < 80% from peak PCT        AND PCT >= 0.5 ng/mL            Continuin g antibiotics                                              encouraged.       Continuing antibiotics            encouraged.    ----------------------------     ------------------------------     PCT level increase compared          PCT > 0.5 ng/mL         with peak PCT AND          PCT >= 0.5 ng/mL             Escalation of antibiotics                                          strongly encouraged.      Escalation of antibiotics        strongly encouraged.   I-Stat CG4 Lactic Acid, ED (not  at [facility])     Status: None   Collection Time: 08/13/14  6:05 PM  Result Value Ref Range   Lactic Acid, Venous 1.46 0.5 - 2.0 mmol/L  MRSA PCR Screening     Status: None   Collection Time: 08/13/14  7:18 PM  Result Value Ref Range   MRSA by PCR NEGATIVE  NEGATIVE    Comment:        The GeneXpert MRSA Assay (FDA approved for NASAL specimens only), is one component of a comprehensive MRSA colonization surveillance program. It is not intended to diagnose MRSA infection nor to guide or monitor treatment for MRSA infections.   Legionella antigen, urine     Status: None   Collection Time: 08/13/14  8:23 PM  Result Value Ref Range   Specimen Description URINE, CLEAN CATCH    Special Requests NONE    Legionella Antigen, Urine      Negative for Legionella pneumophila serogroup 1                                                              Legionella pneumophila serogroup 1 antigen can be detected in urine within 2 to 3 days of infection and may persist even after treatment. This  assay does not detect other Legionella species or serogroups. Performed at [facility]    Report Status 08/14/2014 FINAL   Strep pneumoniae urinary antigen     Status: None   Collection Time: 08/13/14  8:23 PM  Result Value Ref Range   Strep Pneumo Urinary Antigen NEGATIVE NEGATIVE    Comment:        Infection due to S. pneumoniae cannot be absolutely ruled out since the antigen present may be below the detection limit of the test.   Heparin level (unfractionated)     Status: None   Collection Time: 08/13/14  8:48 PM  Result Value Ref Range   Heparin Unfractionated 0.33 0.30 - 0.70 IU/mL    Comment:        IF HEPARIN RESULTS ARE BELOW EXPECTED VALUES, AND PATIENT DOSAGE HAS BEEN CONFIRMED, SUGGEST FOLLOW UP TESTING OF ANTITHROMBIN III LEVELS.   CBC     Status: Abnormal   Collection Time: 08/13/14  8:48 PM  Result Value Ref Range   WBC 10.7 (H) 4.0 - 10.5 K/uL   RBC 4.61 4.22 - 5.81 MIL/uL   Hemoglobin 14.3 13.0 - 17.0 g/dL   HCT 45.1 39.0 - 52.0 %   MCV 97.8 78.0 - 100.0 fL   MCH 31.0 26.0 - 34.0 pg   MCHC 31.7 30.0 - 36.0 g/dL   RDW 12.8 11.5 - 15.5 %   Platelets 244 150 - 400 K/uL  Creatinine, serum     Status: Abnormal    Collection Time: 08/13/14  8:48 PM  Result Value Ref Range   Creatinine, Ser 0.92 0.50 - 1.35 mg/dL   GFR calc non Af Amer 89 (L) >90 mL/min   GFR calc Af Amer >90 >90 mL/min    Comment: (NOTE) The eGFR has been calculated using the CKD EPI equation. This calculation has not been validated in all clinical situations. eGFR's persistently <90 mL/min signify possible Chronic Kidney Disease.   TSH     Status: None   Collection Time: 08/13/14  8:48  PM  Result Value Ref Range   TSH 3.034 0.350 - 4.500 uIU/mL  Culture, sputum-assessment     Status: None   Collection Time: 08/13/14 11:18 PM  Result Value Ref Range   Specimen Description SPUTUM    Special Requests NONE    Sputum evaluation      MICROSCOPIC FINDINGS SUGGEST THAT THIS SPECIMEN IS NOT REPRESENTATIVE OF LOWER RESPIRATORY SECRETIONS. PLEASE RECOLLECT. RESULT CALLED TO, READ BACK BY AND VERIFIED WITH: A. [place] [phone] [id number] GREEN R    Report Status 08/14/2014 FINAL   Lipid panel     Status: None   Collection Time: 08/14/14  2:38 AM  Result Value Ref Range   Cholesterol 167 0 - 200 mg/dL   Triglycerides 43 <150 mg/dL   HDL 69 >39 mg/dL   Total CHOL/HDL Ratio 2.4 RATIO   VLDL 9 0 - 40 mg/dL   LDL Cholesterol 89 0 - 99 mg/dL    Comment:        Total Cholesterol/HDL:CHD Risk Coronary Heart Disease Risk Table                     Men   Women  1/2 Average Risk   3.4   3.3  Average Risk       5.0   4.4  2 X Average Risk   9.6   7.1  3 X Average Risk  23.4   11.0        Use the calculated Patient Ratio above and the CHD Risk Table to determine the patient's CHD Risk.        ATP III CLASSIFICATION (LDL):  <100     mg/dL   Optimal  100-129  mg/dL   Near or Above                    Optimal  130-159  mg/dL   Borderline  160-189  mg/dL   High  >190     mg/dL   Very High   CBC     Status: None   Collection Time: 08/14/14  2:38 AM  Result Value Ref Range   WBC 9.8 4.0 - 10.5 K/uL   RBC 4.70 4.22 - 5.81 MIL/uL    Hemoglobin 14.6 13.0 - 17.0 g/dL   HCT 46.0 39.0 - 52.0 %   MCV 97.9 78.0 - 100.0 fL   MCH 31.1 26.0 - 34.0 pg   MCHC 31.7 30.0 - 36.0 g/dL   RDW 12.7 11.5 - 15.5 %   Platelets 252 150 - 400 K/uL  Heparin level (unfractionated)     Status: Abnormal   Collection Time: 08/14/14  2:38 AM  Result Value Ref Range   Heparin Unfractionated 0.23 (L) 0.30 - 0.70 IU/mL    Comment:        IF HEPARIN RESULTS ARE BELOW EXPECTED VALUES, AND PATIENT DOSAGE HAS BEEN CONFIRMED, SUGGEST FOLLOW UP TESTING OF ANTITHROMBIN III LEVELS.   Heparin level (unfractionated)     Status: None   Collection Time: 08/14/14 11:50 AM  Result Value Ref Range   Heparin Unfractionated 0.30 0.30 - 0.70 IU/mL    Comment:        IF HEPARIN RESULTS ARE BELOW EXPECTED VALUES, AND PATIENT DOSAGE HAS BEEN CONFIRMED, SUGGEST FOLLOW UP TESTING OF ANTITHROMBIN III LEVELS.   Lactate dehydrogenase, pleural or peritoneal fluid     Status: Abnormal   Collection Time: 08/14/14  4:24 PM  Result Value Ref Range  LD, Fluid 67 (H) 3 - 23 U/L    Comment: (NOTE) Results should be evaluated in conjunction with serum values    Fluid Type-FLDH PLEURAL     Comment: FLUID LEFT CORRECTED ON 02/05 AT 1642: PREVIOUSLY REPORTED AS Pleural, L   Protein, pleural or peritoneal fluid     Status: None   Collection Time: 08/14/14  4:25 PM  Result Value Ref Range   Total protein, fluid <3.0 g/dL    Comment: REPEATED TO VERIFY (NOTE) No normal range established for this test Results should be evaluated in conjunction with serum values    Fluid Type-FTP PLEURAL     Comment: FLUID PLEURAL FLUID LEFT CORRECTED ON 02/05 AT 1642: PREVIOUSLY REPORTED AS Pleural, L   Body fluid cell count with differential     Status: Abnormal   Collection Time: 08/14/14  4:25 PM  Result Value Ref Range   Fluid Type-FCT PLEURAL     Comment: FLUID LEFT CORRECTED ON 02/05 AT 1642: PREVIOUSLY REPORTED AS Body Fluid    Color, Fluid YELLOW (A) YELLOW    Appearance, Fluid HAZY (A) CLEAR   WBC, Fluid 1599 (H) 0 - 1000 cu mm   Neutrophil Count, Fluid 25 0 - 25 %   Lymphs, Fluid 52 %   Monocyte-Macrophage-Serous Fluid 22 (L) 50 - 90 %   Eos, Fluid 1 %    Ct Chest W Contrast  08/13/2014   CLINICAL DATA:  Diagnosed with pneumonia 6 days ago, shortness of breath, cough, weakness, smoker  EXAM: CT CHEST WITH CONTRAST  TECHNIQUE: Multidetector CT imaging of the chest was performed during intravenous contrast administration. Sagittal and coronal MPR images reconstructed from axial data set.  CONTRAST:  128m OMNIPAQUE IOHEXOL 300 MG/ML  SOLN IV  COMPARISON:  Chest radiograph 08/13/2014  FINDINGS: Scattered atherosclerotic calcifications aorta and coronary arteries.  Aortic normal caliber.  Pulmonary arteries well opacified and grossly patent on nondedicated exam.  Scattered normal size thoracic lymph nodes.  Cardiac chambers appear enlarged.  Pleural calcifications posterior medial RIGHT hemi thorax.  Visualized upper abdomen unremarkable.  Loculated RIGHT pleural effusion.  Dependent LEFT pleural effusion.  Minimal scattered atelectasis in RIGHT lung with more significant compressive atelectasis in LEFT lower lobe.  Infiltrates identified in LEFT upper and LEFT lower lobes, particularly peripherally in the LEFT upper lobe where focal density is seen which could represent rounded atelectasis or pneumonia.  Emphysematous changes at apices and scattered peribronchial thickening.  No pneumothorax or acute osseous findings.  IMPRESSION: Emphysematous and bronchitic changes question COPD.  BILATERAL pleural effusions, loculated on RIGHT, with scattered infiltrates in LEFT upper and LEFT lower lobes.  Followup until resolution recommended to exclude underlying abnormalities particularly in the periphery of the LEFT upper lobe.  Nonspecific pleural calcifications in the RIGHT hemi thorax, could be related to prior hemothorax, empyema, less likely TB or asbestos exposure due  to unilateral involvement.   Electronically Signed   By: MLavonia DanaM.D.   On: 08/13/2014 17:30   Dg Chest Port 1 View  08/13/2014   CLINICAL DATA:  Atrial fibrillation.  EXAM: PORTABLE CHEST - 1 VIEW  COMPARISON:  August 07, 2014.  FINDINGS: Stable cardiomediastinal silhouette. No pneumothorax is noted. Stable pleural opacity is seen along right lateral chest wall most consistent with loculated effusion, but mass cannot be excluded. Fluid is also noted in the right minor fissure. Increased left perihilar opacity is noted concerning for pneumonia or possibly edema. Bony thorax is intact.  IMPRESSION: Increased  left perihilar opacity is noted concerning for pneumonia or possibly edema.  Stable pleural-based opacity is noted along right lateral chest wall most consistent with loculated pleural effusion, with probable loculated fluid within the right minor fissure is well. However, CT scan of the chest is recommended to rule out pleural-based mass.   Electronically Signed   By: Sabino Dick M.D.   On: 08/13/2014 14:00    Review of Systems  Constitutional: Positive for diaphoresis. Negative for fever and chills.  Respiratory: Positive for cough.   Cardiovascular: Positive for leg swelling. Negative for chest pain and palpitations.   Blood pressure 117/73, pulse 85, temperature 98.2 F (36.8 C), temperature source Oral, resp. rate 22, height _0  (1.753 m), weight 200 lb (90.719 kg), SpO2 94 %. Physical Exam  Vitals reviewed. Constitutional: He is oriented to person, place, and time. He appears well-developed and well-nourished. No distress.  HENT:  Head: Normocephalic and atraumatic.  Eyes: EOM are normal. Pupils are equal, round, and reactive to light.  Neck: Neck supple. No thyromegaly present.  Cardiovascular:  Irregularly irregular  Respiratory:  Rhonchi left > right  Musculoskeletal: He exhibits edema (3+).  Lymphadenopathy:    He has no cervical adenopathy.  Neurological: He is alert  and oriented to person, place, and time. No cranial nerve deficit.  Skin: Skin is warm and dry.    Assessment/Plan: 64 yo man with bilateral pleural effusions. The effusions have very different appearances. The left effusion was layering and associated with underlying air space disease. This was drained, at least partially with thoracentesis today. There were 1600 WBC, LDH was only mildly elevated and the protein was < 3. This is most likely a para-pneumonic effusion. The right effusion has a much more chronic appearance. It is loculated and there are calcifications present. This is more consistent with a chronic process, likely months or years old.   His left effusion has been drained with thoracentesis. His right effusion will require a VATS to drain and decorticate. Given the likelihood that the right effusion is chronic I do not think operating on it is appropriate in the setting of pneumonia in the left lung  He is on appropriate antibiotic therapy for community acquired pneumonia. I would complete course. He will need repeat CXR to make sure the left effusion does not recur. He is being managed for his new onset atrial fibrillation and CHF medically.  Will plan elective right VATS in a few weeks unless new findings indicate a need to intervene sooner.  HENDRICKSON,STEVEN C 08/14/2014, 6:32 PM

## 2014-08-14 NOTE — Progress Notes (Signed)
ANTICOAGULATION CONSULT NOTE - Follow Up Consult  Pharmacy Consult for Heparin  Indication: atrial fibrillation  No Known Allergies  Patient Measurements: Weight: 200 lb (90.719 kg)  Vital Signs: Temp: 98.2 F (36.8 C) (02/05 0400) Temp Source: Oral (02/05 0400) BP: 150/71 mmHg (02/05 0400) Pulse Rate: 81 (02/05 0400)  Labs:  Recent Labs  08/13/14 1255 08/13/14 2048 08/14/14 0238  HGB 14.1 14.3 14.6  HCT 44.5 45.1 46.0  PLT 260 244 252  HEPARINUNFRC  --  0.33 0.23*  CREATININE 0.84 0.92  --     Estimated Creatinine Clearance: 93.9 mL/min (by C-G formula based on Cr of 0.92).  Assessment: Sub-therapeutic heparin level, other labs as above, no issues per RN.   Goal of Therapy:  Heparin level 0.3-0.7 units/ml Monitor platelets by anticoagulation protocol: Yes   Plan:  -Increase heparin drip to 1450 units/hr -1200 HL -Daily CBC/HL -Monitor for bleeding  Abran DukeLedford, Mashell Sieben 08/14/2014,4:26 AM

## 2014-08-14 NOTE — Procedures (Signed)
Thoracentesis Procedure Note  Pre-operative Diagnosis: bilateral pleural effusion  Post-operative Diagnosis: same  Indications: Parapneumonic effusion  Procedure Details  Consent: Informed consent was obtained. Risks of the procedure were discussed including: infection, bleeding, pain, pneumothorax.  Under sterile conditions the patient was positioned. Betadine solution and sterile drapes were utilized.  1% buffered lidocaine was used to anesthetize the 10th rib space. Fluid was obtained without any difficulties and minimal blood loss.  A dressing was applied to the wound and wound care instructions were provided.   Findings 730 ml of cloudy pleural fluid was obtained. A sample was sent to Pathology for cytogenetics, flow, and cell counts, as well as for infection analysis.  Complications:  None; patient tolerated the procedure well.          Condition: stable  Plan A follow up chest x-ray was ordered. Bed Rest for 0 hours. Tylenol 650 mg. for pain.  Attending Attestation: I performed the procedure.  Heber CarolinaBrent Wei Newbrough, MD [facility] Pager: [phone] Cell: [phone] If no response, call [phone]

## 2014-08-14 NOTE — Progress Notes (Signed)
Patient ID: Jason Spencer, male   DOB: [birth year], 64 y.o.   MRN: [id number]  Primary Cardiologist Bryan Lemma   Subjective:  Denies SSCP, palpitations or Dyspnea Was smoking up to day of admission  Objective:  Filed Vitals:   08/14/14 0400 08/14/14 0500 08/14/14 0600 08/14/14 0700  BP: 150/71 129/76 138/76 131/78  Pulse: 81 73 69 92  Temp: 98.2 F (36.8 C)     TempSrc: Oral     Resp: Weight:      SpO2: 98% 92% 95% 97%    Intake/Output from previous day:  Intake/Output Summary (Last 24 hours) at 08/14/14 4098 Last data filed at 08/14/14 0700  Gross per 24 hour  Intake 1211.11 ml  Output   3775 ml  Net -2563.89 ml    Physical Exam: Affect appropriate Thin white male  HEENT: normal Neck supple with no adenopathy JVP normal no bruits no thyromegaly Lungs Diffuse wheezing and good diaphragmatic motion Heart:  S1/S2 no murmur, no rub, gallop or click PMI normal Abdomen: benighn, BS positve, no tenderness, no AAA no bruit.  No HSM or HJR Distal pulses intact with no bruits No edema Neuro non-focal Skin warm and dry No muscular weakness   Lab Results: Basic Metabolic Panel:  Recent Labs  11/91/47 1255 08/13/14 2048  NA 137  --   K 4.4  --   CL 100  --   CO2 31  --   GLUCOSE 94  --   BUN 13  --   CREATININE 0.84 0.92  CALCIUM 8.5  --    Liver Function Tests:  Recent Labs  08/13/14 1255  AST 38*  ALT 41  ALKPHOS 76  BILITOT 0.8  PROT 6.7  ALBUMIN 2.6*   No results for input(s): LIPASE, AMYLASE in the last 72 hours. CBC:  Recent Labs  08/13/14 1255 08/13/14 2048 08/14/14 0238  WBC 10.9* 10.7* 9.8  NEUTROABS 8.2*  --   --   HGB 14.1 14.3 14.6  HCT 44.5 45.1 46.0  MCV 98.5 97.8 97.9  PLT 260 244 252   Fasting Lipid Panel:  Recent Labs  08/14/14 0238  CHOL 167  HDL 69  LDLCALC 89  TRIG 43  CHOLHDL 2.4   Thyroid Function Tests:  Recent Labs  08/13/14 2048  TSH 3.034    Imaging: Ct Chest W  Contrast  08/13/2014   CLINICAL DATA:  Diagnosed with pneumonia 6 days ago, shortness of breath, cough, weakness, smoker  EXAM: CT CHEST WITH CONTRAST  TECHNIQUE: Multidetector CT imaging of the chest was performed during intravenous contrast administration. Sagittal and coronal MPR images reconstructed from axial data set.  CONTRAST:  OMNIPAQUE IOHEXOL 300 MG/ML  SOLN IV  COMPARISON:  Chest radiograph 08/13/2014  FINDINGS: Scattered atherosclerotic calcifications aorta and coronary arteries.  Aortic normal caliber.  Pulmonary arteries well opacified and grossly patent on nondedicated exam.  Scattered normal size thoracic lymph nodes.  Cardiac chambers appear enlarged.  Pleural calcifications posterior medial RIGHT hemi thorax.  Visualized upper abdomen unremarkable.  Loculated RIGHT pleural effusion.  Dependent LEFT pleural effusion.  Minimal scattered atelectasis in RIGHT lung with more significant compressive atelectasis in LEFT lower lobe.  Infiltrates identified in LEFT upper and LEFT lower lobes, particularly peripherally in the LEFT upper lobe where focal density is seen which could represent rounded atelectasis or pneumonia.  Emphysematous changes at apices and scattered peribronchial thickening.  No pneumothorax or acute osseous findings.  IMPRESSION: Emphysematous and bronchitic  changes question COPD.  BILATERAL pleural effusions, loculated on RIGHT, with scattered infiltrates in LEFT upper and LEFT lower lobes.  Followup until resolution recommended to exclude underlying abnormalities particularly in the periphery of the LEFT upper lobe.  Nonspecific pleural calcifications in the RIGHT hemi thorax, could be related to prior hemothorax, empyema, less likely TB or asbestos exposure due to unilateral involvement.   Electronically Signed   By: Ulyses SouthwardMark  Boles M.D.   On: 08/13/2014 17:30   Dg Chest Port 1 View  08/13/2014   CLINICAL DATA:  Atrial fibrillation.  EXAM: PORTABLE CHEST - 1 VIEW  COMPARISON:   August 07, 2014.  FINDINGS: Stable cardiomediastinal silhouette. No pneumothorax is noted. Stable pleural opacity is seen along right lateral chest wall most consistent with loculated effusion, but mass cannot be excluded. Fluid is also noted in the right minor fissure. Increased left perihilar opacity is noted concerning for pneumonia or possibly edema. Bony thorax is intact.  IMPRESSION: Increased left perihilar opacity is noted concerning for pneumonia or possibly edema.  Stable pleural-based opacity is noted along right lateral chest wall most consistent with loculated pleural effusion, with probable loculated fluid within the right minor fissure is well. However, CT scan of the chest is recommended to rule out pleural-based mass.   Electronically Signed   By: Roque LiasJames  Green M.D.   On: 08/13/2014 14:00    Cardiac Studies:  ECG:  Rapid afib nonspecific ST changes    Telemetry: afib rates 80-100 no long pauses  Echo: pending  Medications:   . azithromycin  500 mg Oral Q24H  . cefTRIAXone (ROCEPHIN)  IV  1 g Intravenous Q24H  . furosemide  40 mg Intravenous BID  . levalbuterol  0.63 mg Nebulization Q8H  . methylPREDNISolone (SOLU-MEDROL) injection  60 mg Intravenous 3 times per day  . nicotine  21 mg Transdermal Daily  . piperacillin-tazobactam (ZOSYN)  IV  3.375 g Intravenous Q8H  . vancomycin  1,000 mg Intravenous Q8H     . sodium chloride 50 mL/hr at 08/13/14 2003  . diltiazem (CARDIZEM) infusion 10 mg/hr (08/13/14 2354)  . heparin 1,450 Units/hr (08/14/14 0429)    Assessment/Plan:  Afib.  Continue heparin  Echo pending  If EF normal would start NOAC over weekend and plan TEE/DCC for Monday.  If EF is bad would plan right and left cath Monday and TEE/DCC Tuesday Continue cardizem drip and transition to PO  Avoid beta blockers for now given active wheezing COPD  CT abnormal with pleural plaquing  contnue steroids and dula antibiotics.  Follow serial CXR;s  Subjectively he is  improved  Charlton Hawseter Coyt Govoni 08/14/2014, 8:21 AM

## 2014-08-14 NOTE — Consult Note (Signed)
Name: Jason Spencer Kniskern MRN: [id number] DOB: [birth year]    ADMISSION DATE:  08/13/2014 CONSULTATION DATE:  08/14/2014  REFERRING MD :  Thedore MinsSingh  CHIEF COMPLAINT:  CAP, stable pleural opacity  BRIEF PATIENT DESCRIPTION: 64 y.o. M [facility] after seeing PCP and found to have new onset A.fib vs A.flutter with RVR.  Also has CAP, started on abx 1 week ago but no resolution in symptoms.  CXR in ED revealed stable right sided pleural based opacity concerning for effusion vs mass.  PCCM consulted for further recs.  SIGNIFICANT EVENTS  2/4 - saw PCP in f/u after seeing urgent care 1 week prior, EKG with new onset A.flutter / A.fib, instructed to come to ED for further eval.  STUDIES:  CXR 2/4 >>> increased left perihilar opacity concerning for PNA, stable pleural based opacity in right lateral chest wall most c/w loculated effusion.  Recommend CT chest to r/u mass. CT chest 2/4 > R sided apical loculated and anterior effusion with intralobular segment, apparent free flowing effusion on left with multiple areas of consolidation; emphysema noted bilaterally    SUBJECTIVE: feeling a little better  VITAL SIGNS: Temp:  [97.3 F (36.3 C)-98.5 F (36.9 C)] 98.5 F (36.9 C) (02/05 1157) Pulse Rate:  [55-117] 82 (02/05 1157) Resp:  [14-29] 23 (02/05 1157) BP: (97-159)/(57-110) 111/72 mmHg (02/05 1157) SpO2:  [89 %-98 %] 90 % (02/05 1157) Weight:  [90.719 kg (200 lb)] 90.719 kg (200 lb) (02/04 1327)  PHYSICAL EXAMINATION: General: no distress HEENT: NCAT, EOmi PULM: diminished left base, few crackles, CT R CV: Irreg irreg AB: BS+, soft Ext: warm, edema noted Neuro: A&Ox4, maew    Recent Labs Lab 08/13/14 1255 08/13/14 2048  NA 137  --   K 4.4  --   CL 100  --   CO2 31  --   BUN 13  --   CREATININE 0.84 0.92  GLUCOSE 94  --     Recent Labs Lab 08/13/14 1255 08/13/14 2048 08/14/14 0238  HGB 14.1 14.3 14.6  HCT 44.5 45.1 46.0  WBC 10.9* 10.7* 9.8  PLT 260 244 252     ASSESSMENT / PLAN:  Bilateral effusions> right sided effusion is loculated, has some calcification and intralobular component. Could represent empyema, unlikely to drain by thoracentesis as appears chronic. My suspicion is that this has been present since November based on his history.  Could be malignant or infective.  Given loculated component, I think this needs VATS approach; will consult TCTS  Left effusion> with multilobar pneumonia on left suspect it is parapneumonic. Appears free flowing so will perform thoracentesis this afternoon on left (asked RN to hold heparin now)   New onset A.fib vs A.flutter CHF Recs: Per [facility], Cardiology   Heber CarolinaBrent Montrail Mehrer, MD [place] [facility] Pager: [phone] Cell: [phone] If no response, call [phone]

## 2014-08-14 NOTE — Progress Notes (Signed)
Bilateral lower extremity venous duplex NEGATIVE DVT.  [id number] completed   KE

## 2014-08-14 NOTE — Progress Notes (Signed)
Md gave order to restart heparin gtt.-done.

## 2014-08-14 NOTE — Progress Notes (Signed)
  Echocardiogram 2D Echocardiogram has been performed.  Janalyn HarderWest, Rivky Clendenning R 08/14/2014, 10:43 AM

## 2014-08-14 NOTE — Progress Notes (Signed)
ANTICOAGULATION CONSULT NOTE - Follow Up Consult  Pharmacy Consult for heparin Indication: atrial fibrillation  No Known Allergies  Patient Measurements: Height: 5\' 9"  (175.3 cm) Weight: 200 lb (90.719 kg) IBW/kg (Calculated) : 70.7 Heparin Dosing Weight: 90kg  Vital Signs: Temp: 98.5 F (36.9 C) (02/05 1157) Temp Source: Oral (02/05 1157) BP: 111/72 mmHg (02/05 1157) Pulse Rate: 82 (02/05 1157)  Labs:  Recent Labs  08/13/14 1255 08/13/14 2048 08/14/14 0238 08/14/14 1150  HGB 14.1 14.3 14.6  --   HCT 44.5 45.1 46.0  --   PLT 260 244 252  --   HEPARINUNFRC  --  0.33 0.23* 0.30  CREATININE 0.84 0.92  --   --     Estimated Creatinine Clearance: 93.9 mL/min (by C-G formula based on Cr of 0.92).  Assessment: 64 yo M presents on 2/4 with SOB, wheezing, and cough. Found to be in Afib with RVR at admission, but presently HR controlled.   Started on heparin overnight, heparin is now at low end of goal on 1450 units/hr. CBC stable, no bleeding or IV issues noted.   Echo shows EF of 20-25%. Diffuse hypokinesis.  Goal of Therapy:  Heparin level 0.3-0.7 units/ml Monitor platelets by anticoagulation protocol: Yes   Plan:  Increase heparin to 1550 units/hr to keep within range. Check 6 hours HL Follow cardiac plan  Sheppard CoilFrank Wilson PharmD., BCPS Clinical Pharmacist Pager [phone] 08/14/2014 1:10 PM

## 2014-08-14 NOTE — Progress Notes (Signed)
Utilization Review Completed.Lemoyne Scarpati T2/11/2014  

## 2014-08-15 DIAGNOSIS — Z9889 Other specified postprocedural states: Secondary | ICD-10-CM

## 2014-08-15 DIAGNOSIS — J90 Pleural effusion, not elsewhere classified: Secondary | ICD-10-CM

## 2014-08-15 DIAGNOSIS — I5023 Acute on chronic systolic (congestive) heart failure: Secondary | ICD-10-CM | POA: Diagnosis present

## 2014-08-15 LAB — PH, BODY FLUID: pH, Fluid: 8.5

## 2014-08-15 LAB — CBC
HEMATOCRIT: 44.2 % (ref 39.0–52.0)
Hemoglobin: 14.2 g/dL (ref 13.0–17.0)
MCH: 31 pg (ref 26.0–34.0)
MCHC: 32.1 g/dL (ref 30.0–36.0)
MCV: 96.5 fL (ref 78.0–100.0)
Platelets: 255 10*3/uL (ref 150–400)
RBC: 4.58 MIL/uL (ref 4.22–5.81)
RDW: 12.7 % (ref 11.5–15.5)
WBC: 22.5 10*3/uL — AB (ref 4.0–10.5)

## 2014-08-15 LAB — VANCOMYCIN, TROUGH: Vancomycin Tr: 18.5 ug/mL (ref 10.0–20.0)

## 2014-08-15 LAB — COMPREHENSIVE METABOLIC PANEL
ALT: 41 U/L (ref 0–53)
AST: 34 U/L (ref 0–37)
Albumin: 2.3 g/dL — ABNORMAL LOW (ref 3.5–5.2)
Alkaline Phosphatase: 73 U/L (ref 39–117)
Anion gap: 8 (ref 5–15)
BILIRUBIN TOTAL: 0.8 mg/dL (ref 0.3–1.2)
BUN: 16 mg/dL (ref 6–23)
CALCIUM: 8.3 mg/dL — AB (ref 8.4–10.5)
CO2: 33 mmol/L — ABNORMAL HIGH (ref 19–32)
Chloride: 94 mmol/L — ABNORMAL LOW (ref 96–112)
Creatinine, Ser: 0.91 mg/dL (ref 0.50–1.35)
GFR calc Af Amer: >90 mL/min (ref >90)
GFR calc non Af Amer: 90 mL/min — ABNORMAL LOW (ref >90)
Glucose, Bld: 143 mg/dL — ABNORMAL HIGH (ref 70–99)
POTASSIUM: 3.7 mmol/L (ref 3.5–5.1)
Sodium: 135 mmol/L (ref 135–145)
TOTAL PROTEIN: 6.7 g/dL (ref 6.0–8.3)

## 2014-08-15 LAB — HEPARIN LEVEL (UNFRACTIONATED)
HEPARIN UNFRACTIONATED: 0.47 [IU]/mL (ref 0.30–0.70)
Heparin Unfractionated: 0.68 IU/mL (ref 0.30–0.70)

## 2014-08-15 LAB — PROCALCITONIN: Procalcitonin: <0.1 ng/mL

## 2014-08-15 MED ORDER — CARVEDILOL 3.125 MG PO TABS: 3.1250 mg | ORAL_TABLET | Freq: Two times a day (BID) | ORAL | Status: DC

## 2014-08-15 MED ORDER — LEVALBUTEROL HCL 0.63 MG/3ML IN NEBU: 0.6300 mg | INHALATION_SOLUTION | Freq: Four times a day (QID) | RESPIRATORY_TRACT | Status: DC | PRN

## 2014-08-15 MED ORDER — ASPIRIN 81 MG PO CHEW
81.0000 mg | CHEWABLE_TABLET | Freq: Every day | ORAL | Status: DC
  Administered 2014-08-15 – 2014-08-21 (×7): 81 mg via ORAL
  Filled 2014-08-15 (×7): qty 1

## 2014-08-15 MED ORDER — ATORVASTATIN CALCIUM 40 MG PO TABS
40.0000 mg | ORAL_TABLET | Freq: Every day | ORAL | Status: DC
  Administered 2014-08-15 – 2014-08-20 (×6): 40 mg via ORAL
  Filled 2014-08-15 (×7): qty 1

## 2014-08-15 MED ORDER — BISOPROLOL FUMARATE 5 MG PO TABS
5.0000 mg | ORAL_TABLET | Freq: Every day | ORAL | Status: DC
  Administered 2014-08-15 – 2014-08-16 (×2): 5 mg via ORAL
  Filled 2014-08-15 (×3): qty 1

## 2014-08-15 MED ORDER — AMIODARONE HCL IN DEXTROSE 360-4.14 MG/200ML-% IV SOLN
60.0000 mg/h | INTRAVENOUS | Status: AC
  Administered 2014-08-15: 60 mg/h via INTRAVENOUS
  Filled 2014-08-15: qty 200

## 2014-08-15 MED ORDER — LEVALBUTEROL HCL 0.63 MG/3ML IN NEBU
0.6300 mg | INHALATION_SOLUTION | Freq: Two times a day (BID) | RESPIRATORY_TRACT | Status: DC
  Administered 2014-08-15 – 2014-08-17 (×6): 0.63 mg via RESPIRATORY_TRACT
  Filled 2014-08-15 (×12): qty 3

## 2014-08-15 MED ORDER — FUROSEMIDE 10 MG/ML IJ SOLN
40.0000 mg | Freq: Three times a day (TID) | INTRAMUSCULAR | Status: AC
  Administered 2014-08-15 – 2014-08-16 (×5): 40 mg via INTRAVENOUS
  Filled 2014-08-15 (×4): qty 4

## 2014-08-15 MED ORDER — METHYLPREDNISOLONE SODIUM SUCC 40 MG IJ SOLR
40.0000 mg | Freq: Two times a day (BID) | INTRAMUSCULAR | Status: DC
  Administered 2014-08-15 – 2014-08-16 (×2): 40 mg via INTRAVENOUS
  Filled 2014-08-15 (×3): qty 1

## 2014-08-15 MED ORDER — LISINOPRIL 5 MG PO TABS
5.0000 mg | ORAL_TABLET | Freq: Every day | ORAL | Status: DC
  Administered 2014-08-15 – 2014-08-16 (×2): 5 mg via ORAL
  Filled 2014-08-15 (×2): qty 1

## 2014-08-15 MED ORDER — AMIODARONE HCL IN DEXTROSE 360-4.14 MG/200ML-% IV SOLN
30.0000 mg/h | INTRAVENOUS | Status: DC
  Administered 2014-08-15 – 2014-08-19 (×8): 30 mg/h via INTRAVENOUS
  Filled 2014-08-15 (×18): qty 200

## 2014-08-15 MED ORDER — IPRATROPIUM BROMIDE 0.02 % IN SOLN
0.5000 mg | Freq: Two times a day (BID) | RESPIRATORY_TRACT | Status: DC
  Administered 2014-08-15 – 2014-08-17 (×6): 0.5 mg via RESPIRATORY_TRACT
  Filled 2014-08-15 (×6): qty 2.5

## 2014-08-15 NOTE — Progress Notes (Addendum)
Patient ID: Jason Spencer, male   DOB: 09-28-1950, 65 y.o.   MRN: [id number]   SUBJECTIVE: Remains in atrial fibrillation this morning.  Denies chest, says breathing is better.    Has had left-sided thoracentesis, suspect parapneumonic effusion.  Has loculated right-sided effusion, evaluated by Dr Dorris Fetch, plan for VATS after antibiotic treatment of CAP in a few weeks.   Echo with EF 20-25%, moderately dilated RV with mildly decreased systolic function.    Filed Vitals:   08/15/14 0400 08/15/14 0624 08/15/14 0731 08/15/14 0851  BP: 117/83 114/75 139/77   Pulse: 82 74 82   Temp:   98.1 F (36.7 C)   TempSrc:   Oral   Resp: Height:      Weight:      SpO2: 98% 95% 96% 93%    Intake/Output Summary (Last 24 hours) at 08/15/14 1153 Last data filed at 08/15/14 1100  Gross per 24 hour  Intake   1999 ml  Output   2980 ml  Net   -981 ml    LABS: Basic Metabolic Panel:  Recent Labs  40/98/11 1255 08/13/14 2048 08/15/14 0256  NA 137  --  135  K 4.4  --  3.7  CL 100  --  94*  CO2 31  --  33*  GLUCOSE 94  --  143*  BUN 13  --  16  CREATININE 0.84 0.92 0.91  CALCIUM 8.5  --  8.3*   Liver Function Tests:  Recent Labs  08/13/14 1255 08/14/14 1906 08/15/14 0256  AST 38*  --  34  ALT 41  --  41  ALKPHOS 76  --  73  BILITOT 0.8  --  0.8  PROT 6.7 6.7 6.7  ALBUMIN 2.6*  --  2.3*   No results for input(s): LIPASE, AMYLASE in the last 72 hours. CBC:  Recent Labs  08/13/14 1255  08/14/14 0238 08/15/14 0256  WBC 10.9*  < > 9.8 22.5*  NEUTROABS 8.2*  --   --   --   HGB 14.1  < > 14.6 14.2  HCT 44.5  < > 46.0 44.2  MCV 98.5  < > 97.9 96.5  PLT 260  < > 252 255  < > = values in this interval not displayed. Cardiac Enzymes: No results for input(s): CKTOTAL, CKMB, CKMBINDEX, TROPONINI in the last 72 hours. BNP: Invalid input(s): POCBNP D-Dimer: No results for input(s): DDIMER in the last 72 hours. Hemoglobin A1C: No results for input(s): HGBA1C in  the last 72 hours. Fasting Lipid Panel:  Recent Labs  08/14/14 0238  CHOL 167  HDL 69  LDLCALC 89  TRIG 43  CHOLHDL 2.4   Thyroid Function Tests:  Recent Labs  08/13/14 2048  TSH 3.034   Anemia Panel: No results for input(s): VITAMINB12, FOLATE, FERRITIN, TIBC, IRON, RETICCTPCT in the last 72 hours.  RADIOLOGY: Ct Chest W Contrast  08/13/2014   CLINICAL DATA:  Diagnosed with pneumonia 6 days ago, shortness of breath, cough, weakness, smoker  EXAM: CT CHEST WITH CONTRAST  TECHNIQUE: Multidetector CT imaging of the chest was performed during intravenous contrast administration. Sagittal and coronal MPR images reconstructed from axial data set.  CONTRAST:  OMNIPAQUE IOHEXOL 300 MG/ML  SOLN IV  COMPARISON:  Chest radiograph 08/13/2014  FINDINGS: Scattered atherosclerotic calcifications aorta and coronary arteries.  Aortic normal caliber.  Pulmonary arteries well opacified and grossly patent on nondedicated exam.  Scattered normal size thoracic lymph nodes.  Cardiac  chambers appear enlarged.  Pleural calcifications posterior medial RIGHT hemi thorax.  Visualized upper abdomen unremarkable.  Loculated RIGHT pleural effusion.  Dependent LEFT pleural effusion.  Minimal scattered atelectasis in RIGHT lung with more significant compressive atelectasis in LEFT lower lobe.  Infiltrates identified in LEFT upper and LEFT lower lobes, particularly peripherally in the LEFT upper lobe where focal density is seen which could represent rounded atelectasis or pneumonia.  Emphysematous changes at apices and scattered peribronchial thickening.  No pneumothorax or acute osseous findings.  IMPRESSION: Emphysematous and bronchitic changes question COPD.  BILATERAL pleural effusions, loculated on RIGHT, with scattered infiltrates in LEFT upper and LEFT lower lobes.  Followup until resolution recommended to exclude underlying abnormalities particularly in the periphery of the LEFT upper lobe.  Nonspecific pleural  calcifications in the RIGHT hemi thorax, could be related to prior hemothorax, empyema, less likely TB or asbestos exposure due to unilateral involvement.   Electronically Signed   By: Ulyses Southward M.D.   On: 08/13/2014 17:30   Dg Chest Port 1 View  08/14/2014   CLINICAL DATA:  Shortness of breath, status post thoracentesis  EXAM: PORTABLE CHEST - 1 VIEW  COMPARISON:  CT chest dated 08/13/2014  FINDINGS: Pleural-based opacity along the lateral right hemithorax, corresponding to a loculated pleural effusion on CT.  Patchy opacity in the left upper lobe, suspicious for pneumonia, although improving when compared to prior chest radiographs.  No pneumothorax status post thoracentesis.  Cardiomegaly.  IMPRESSION: No pneumothorax status post thoracentesis.  Otherwise unchanged.   Electronically Signed   By: Charline Bills M.D.   On: 08/14/2014 19:15   Dg Chest Port 1 View  08/13/2014   CLINICAL DATA:  Atrial fibrillation.  EXAM: PORTABLE CHEST - 1 VIEW  COMPARISON:  August 07, 2014.  FINDINGS: Stable cardiomediastinal silhouette. No pneumothorax is noted. Stable pleural opacity is seen along right lateral chest wall most consistent with loculated effusion, but mass cannot be excluded. Fluid is also noted in the right minor fissure. Increased left perihilar opacity is noted concerning for pneumonia or possibly edema. Bony thorax is intact.  IMPRESSION: Increased left perihilar opacity is noted concerning for pneumonia or possibly edema.  Stable pleural-based opacity is noted along right lateral chest wall most consistent with loculated pleural effusion, with probable loculated fluid within the right minor fissure is well. However, CT scan of the chest is recommended to rule out pleural-based mass.   Electronically Signed   By: Roque Lias M.D.   On: 08/13/2014 14:00    PHYSICAL EXAM General: NAD Neck: JVP 10-11 cm, no thyromegaly or thyroid nodule.  Lungs: Crackles left base, decreased breath sounds right  base.  CV: Lateral PMI.  Heart irregular S1/S2, no S3/S4, no murmur.  2+ edema to knees.   Abdomen: Soft, nontender, no hepatosplenomegaly, no distention.  Neurologic: Alert and oriented x 3.  Psych: Normal affect. Extremities: No clubbing or cyanosis.   TELEMETRY: Reviewed telemetry pt in atrial fibrillation 90s  ASSESSMENT AND PLAN: 64 yo with history of smoking presented with dyspnea and was found to be in atrial fibrillation with RVR and PNA + effusions on CXR.  He was clinically in CHF as well.  1. Atrial fibrillation: With RVR at admission.  Not sure how long he has been in atrial fibrillation. EF is low, cannot rule out tachy-mediated cardiomyopathy.  I think that we need to get him out of atrial fibrillation.  Given low EF, I think that our best chance long-term is likely  with an antiarrhythmic perhaps considering atrial fibrillation ablation as well. - Stop diltiazem gtt with low EF. - Will start amiodarone gtt. Amiodarone will increase our chances of getting/keeping him in NSR.  However, given his lung problems, this may not be a good long-term option.   - Will use bisoprolol as well (beta-1 selective given concern for COPD).  - Planning for cath Monday.  Continue heparin gtt until cath.  Will start Eliquis after cath, after 5 doses can do TEE-DCCV (depending on what's found on cath).  2. Acute systolic CHF: EF 78-46%20-25% with mildly decreased RV systolic function.  He is volume overloaded on exam.  ? Cause for CMP: could be tachy-mediated, but he also has RFs for CAD.  CAD seems a strong possibility with smoking history.  -  LHC/RHC Monday.  -  Bisoprolol 5 mg daily, lisinopril 5 mg daily to start today.  -  Increase Lasix to 40 mg IV every 8 hrs, continue diuresis.  - Add ASA and statin given concern for CAD.  3. ID: Suspect CAP with parapneumonic effusion on left.  Currently on vanco/Zosyn.  He also has a right loculated effusion.  Plan per Dr Dorris FetchHendrickson to treat with abx then in a few  weeks (would have to be at least 4 wks post-DCCV), plan VATS on right.  4. Smoking: Needs to quit.  Possible COPD.   45 min critical care time due to complex patient   Marca AnconaDalton Dorell Gatlin 08/15/2014 12:03 PM

## 2014-08-15 NOTE — Progress Notes (Signed)
ANTICOAGULATION CONSULT NOTE - Follow Up Consult  Pharmacy Consult for Heparin  Indication: atrial fibrillation  No Known Allergies  Patient Measurements: Height: 5\' 9"  (175.3 cm) Weight: 200 lb (90.719 kg) IBW/kg (Calculated) : 70.7  Vital Signs: Temp: 97.5 F (36.4 C) (02/06 0329) Temp Source: Oral (02/06 0329) BP: 126/67 mmHg (02/06 0329) Pulse Rate: 80 (02/06 0329)  Labs:  Recent Labs  08/13/14 1255  08/13/14 2048 08/14/14 0238  08/14/14 1906 08/14/14 2223 08/15/14 0256  HGB 14.1  --  14.3 14.6  --   --   --  14.2  HCT 44.5  --  45.1 46.0  --   --   --  44.2  PLT 260  --  244 252  --   --   --  255  HEPARINUNFRC  --   < > 0.33 0.23*  < > 0.13* 0.26* 0.47  CREATININE 0.84  --  0.92  --   --   --   --  0.91  < > = values in this interval not displayed.  Estimated Creatinine Clearance: 94.9 mL/min (by C-G formula based on Cr of 0.91).  Assessment: Therapeutic heparin level x 1, drawn slightly early, other labs as above.   Goal of Therapy:  Heparin level 0.3-0.7 units/ml Monitor platelets by anticoagulation protocol: Yes   Plan:  -Continue heparin at 1650 units/hr -1100 HL -Daily CBC/HL -Monitor for bleeding  Abran DukeLedford, Ellison Rieth 08/15/2014,4:08 AM

## 2014-08-15 NOTE — Progress Notes (Signed)
Had 11 beats of v-tach, asymptomatic continue to monitor.

## 2014-08-15 NOTE — Progress Notes (Signed)
ANTIBIOTIC CONSULT NOTE - FOLLOW UP  Pharmacy Consult for Vancomycin and Zosyn Indication: pneumonia  No Known Allergies  Patient Measurements: Height: 5\' 9"  (175.3 cm) Weight: 200 lb (90.719 kg) IBW/kg (Calculated) : 70.7   Vital Signs: Temp: 97.5 F (36.4 C) (02/06 1150) Temp Source: Oral (02/06 1150) BP: 121/79 mmHg (02/06 1300) Pulse Rate: 83 (02/06 1300) Intake/Output from previous day: 02/05 0701 - 02/06 0700 In: 2191 [P.O.:550; I.V.:891; IV Piggyback:750] Out: 3780 [Urine:3050] Intake/Output from this shift: Total I/O In: 572.1 [P.O.:300; I.V.:222.1; IV Piggyback:50] Out: 2100 [Urine:2100]  Labs:  Recent Labs  08/13/14 1255 08/13/14 2048 08/14/14 0238 08/15/14 0256  WBC 10.9* 10.7* 9.8 22.5*  HGB 14.1 14.3 14.6 14.2  PLT 260 244 252 255  CREATININE 0.84 0.92  --  0.91   Estimated Creatinine Clearance: 94.9 mL/min (by C-G formula based on Cr of 0.91).  Recent Labs  08/15/14 1400  VANCOTROUGH 18.5     Microbiology: Recent Results (from the past 720 hour(s))  Blood Culture (routine x 2)     Status: None (Preliminary result)   Collection Time: 08/13/14 12:55 PM  Result Value Ref Range Status   Specimen Description BLOOD FOREARM RIGHT  Final   Special Requests BOTTLES DRAWN AEROBIC AND ANAEROBIC 5CC  Final   Culture   Final           BLOOD CULTURE RECEIVED NO GROWTH TO DATE CULTURE WILL BE HELD FOR 5 DAYS BEFORE ISSUING A FINAL NEGATIVE REPORT Performed at [facility]    Report Status PENDING  Incomplete  Blood Culture (routine x 2)     Status: None (Preliminary result)   Collection Time: 08/13/14  1:00 PM  Result Value Ref Range Status   Specimen Description BLOOD LEFT ANTECUBITAL  Final   Special Requests BOTTLES DRAWN AEROBIC AND ANAEROBIC 10MLS  Final   Culture   Final           BLOOD CULTURE RECEIVED NO GROWTH TO DATE CULTURE WILL BE HELD FOR 5 DAYS BEFORE ISSUING A FINAL NEGATIVE REPORT Performed at [facility]    Report  Status PENDING  Incomplete  Urine culture     Status: None   Collection Time: 08/13/14  1:30 PM  Result Value Ref Range Status   Specimen Description URINE, CLEAN CATCH  Final   Special Requests NONE  Final   Colony Count NO GROWTH Performed at [facility]   Final   Culture NO GROWTH Performed at [facility]   Final   Report Status 08/14/2014 FINAL  Final  MRSA PCR Screening     Status: None   Collection Time: 08/13/14  7:18 PM  Result Value Ref Range Status   MRSA by PCR NEGATIVE NEGATIVE Final    Comment:        The GeneXpert MRSA Assay (FDA approved for NASAL specimens only), is one component of a comprehensive MRSA colonization surveillance program. It is not intended to diagnose MRSA infection nor to guide or monitor treatment for MRSA infections.   Culture, sputum-assessment     Status: None   Collection Time: 08/13/14 11:18 PM  Result Value Ref Range Status   Specimen Description SPUTUM  Final   Special Requests NONE  Final   Sputum evaluation   Final    MICROSCOPIC FINDINGS SUGGEST THAT THIS SPECIMEN IS NOT REPRESENTATIVE OF LOWER RESPIRATORY SECRETIONS. PLEASE RECOLLECT. RESULT CALLED TO, READ BACK BY AND VERIFIED WITH: A. WEATHERFORD RN [phone] GREEN R  Report Status 08/14/2014 FINAL  Final  AFB culture with smear     Status: None (Preliminary result)   Collection Time: 08/14/14  4:25 PM  Result Value Ref Range Status   Specimen Description PLEURAL FLUID LEFT  Final   Special Requests NONE  Final   Acid Fast Smear   Final    NO ACID FAST BACILLI SEEN Performed at [facility]    Culture   Final    CULTURE WILL BE EXAMINED FOR 6 WEEKS BEFORE ISSUING A FINAL REPORT Performed at [facility]    Report Status PENDING  Incomplete  Body fluid culture     Status: None (Preliminary result)   Collection Time: 08/14/14  4:26 PM  Result Value Ref Range Status   Specimen Description PLEURAL FLUID LEFT  Final   Special  Requests NONE  Final   Gram Stain   Final    RARE WBC PRESENT,BOTH PMN AND MONONUCLEAR NO ORGANISMS SEEN Performed at [facility]    Culture NO GROWTH Performed at [facility]   Final   Report Status PENDING  Incomplete    Anti-infectives    Start     Dose/Rate Route Frequency Ordered Stop   08/14/14 1300  azithromycin (ZITHROMAX) 500 mg in dextrose 5 % 250 mL IVPB  Status:  Discontinued     500 mg250 mL/hr over 60 Minutes Intravenous Every 24 hours 08/13/14 1314 08/13/14 1724   08/14/14 1300  cefTRIAXone (ROCEPHIN) 1 g in dextrose 5 % 50 mL IVPB  Status:  Discontinued     1 g100 mL/hr over 30 Minutes Intravenous Every 24 hours 08/13/14 1314 08/13/14 1724   08/14/14 1000  cefTRIAXone (ROCEPHIN) 1 g in dextrose 5 % 50 mL IVPB - Premix  Status:  Discontinued     1 g100 mL/hr over 30 Minutes Intravenous Every 24 hours 08/13/14 1941 08/14/14 1037   08/14/14 1000  azithromycin (ZITHROMAX) tablet 500 mg  Status:  Discontinued     500 mg Oral Every 24 hours 08/13/14 1941 08/14/14 1037   08/13/14 2200  vancomycin (VANCOCIN) IVPB 1000 mg/200 mL premix     1,000 mg200 mL/hr over 60 Minutes Intravenous Every 8 hours 08/13/14 1815     08/13/14 2100  piperacillin-tazobactam (ZOSYN) IVPB 3.375 g     3.375 g12.5 mL/hr over 240 Minutes Intravenous Every 8 hours 08/13/14 1815     08/13/14 1400  vancomycin (VANCOCIN) IVPB 1000 mg/200 mL premix     1,000 mg200 mL/hr over 60 Minutes Intravenous  Once 08/13/14 1351 08/13/14 1551   08/13/14 1400  piperacillin-tazobactam (ZOSYN) IVPB 3.375 g     3.375 g100 mL/hr over 30 Minutes Intravenous  Once 08/13/14 1351 08/13/14 1524   08/13/14 1245  cefTRIAXone (ROCEPHIN) 1 g in dextrose 5 % 50 mL IVPB     1 g100 mL/hr over 30 Minutes Intravenous  Once 08/13/14 1235 08/13/14 1402   08/13/14 1245  azithromycin (ZITHROMAX) 500 mg in dextrose 5 % 250 mL IVPB     500 mg250 mL/hr over 60 Minutes Intravenous  Once 08/13/14 1235 08/13/14 1428       Assessment: 63yo male who presented on 2/4 with SOB, wheezing, and cough.  Pt recently failed outpatient treatment for CAP w/ Levaquin, so pharmacy was consulted to dose Vanc and Zosyn.  Pt to continue empiric Vanc and Zosyn, now s/p thoracentesis.  Pt is afebrile, WBC elevated today at 22.5 (up from 9.8 yesterday), SCr stable.  2/5 pleural  fluid Cx >> ngtd 2/4 BCx2 >> ngtd 2/4 UCx >> ngtd  Vanc 2/4 >> Zosyn 2/4 >>  Vanc trough therapeutic at 18.5  Goal of Therapy:  Vancomycin trough level 15-20 mcg/ml  Plan:  Continue vancomycin  IV q8h Continue Zosyn 3.375g IV q8h Monitor C&S, clinical improvement, renal function  Waynette Buttery, PharmD Clinical Pharmacy Resident Pager: [phone] 08/15/2014 3:11 PM

## 2014-08-15 NOTE — Progress Notes (Signed)
ANTICOAGULATION CONSULT NOTE - Follow Up Consult  Pharmacy Consult for Heparin  Indication: atrial fibrillation  No Known Allergies  Patient Measurements: Height: 5\' 9"  (175.3 cm) Weight: 200 lb (90.719 kg) IBW/kg (Calculated) : 70.7  Vital Signs: Temp: 98.1 F (36.7 C) (02/06 0731) Temp Source: Oral (02/06 0731) BP: 139/77 mmHg (02/06 0731) Pulse Rate: 82 (02/06 0731)  Labs:  Recent Labs  08/13/14 1255  08/13/14 2048 08/14/14 0238  08/14/14 2223 08/15/14 0256 08/15/14 1028  HGB 14.1  --  14.3 14.6  --   --  14.2  --   HCT 44.5  --  45.1 46.0  --   --  44.2  --   PLT 260  --  244 252  --   --  255  --   HEPARINUNFRC  --   < > 0.33 0.23*  < > 0.26* 0.47 0.68  CREATININE 0.84  --  0.92  --   --   --  0.91  --   < > = values in this interval not displayed.  Estimated Creatinine Clearance: 94.9 mL/min (by C-G formula based on Cr of 0.91).  Assessment: 64yo M presents on 2/4 with SOB, wheezing, and cough. Found to be in Afib with RVR at admission.  To start heparin for Afib for now >> plans to switch to DOAC after cath plans determined. Heparin level therapeutic x2, but on the high end of therapeutic range.  Goal of Therapy:  Heparin level 0.3-0.7 units/ml Monitor platelets by anticoagulation protocol: Yes   Plan:  -Continue heparin at 1650 units/hr -Daily heparin level, CBC -Monitor for bleeding -F/u switch to DOAC  Waynette Butteryegan K. Rama Sorci, PharmD Clinical Pharmacy Resident Pager: [phone] 08/15/2014 11:40 AM

## 2014-08-15 NOTE — Progress Notes (Signed)
Jason Spencer GNF:[id number] DOB: 14-Sep-1950 DOA: 08/13/2014 PCP: Rudi Heap, MD  Admit HPI / Brief Narrative: 64 y.o. Male with no significant past medical history except for tobacco abuse stating it has been "years" since he has been to see a physician was sent to the [facility] emergency department from [facility] family practice. Patient recently was treated for community-acquired pneumonia with Levaquin. Was instructed at that time to establish with a primary care physician. Upon presentation today to the PCP office he was found to have a rapid irregular heart rate and was sent directly to [facility] ER via EMS.   Upon arrival to the emergency department patient was found to be in atrial fibrillation with RVR and was bolused and subsequently started on a Cardizem infusion. Heart rate has decreased from 137 to 108 but he remained in atrial fibrillation. Chest x-ray revealed increased left perihilar opacity concerning for pneumonia or possibly edema. There was also a stable pleural-based opacity in the right lateral chest wall most consistent with loculated pleural effusion. Patient had mild leukocytosis and was given empiric antibiotics to cover for pneumonia. Patient stated he has had upper respiratory symptoms for 1-1/2 months. He initially took some leftover Zithromax from a friend. He was seen at the urgent care on the 29th and was started on antibiotics with some improvement in his symptoms. For several weeks he noticed increasing lower extremity edema, orthopnea and mild dyspnea on exertion. He has noticed the edema has worsened over the past 48 hours.   HPI/Subjective: Pt is resting comfortably and states he feels much better.  Denies any current cp, sob, n/v, or abdom pain.    Assessment/Plan:  Acute hypoxic respiratory failure - CAP + acute COPD exac +/- CHF  tx each individual issue and complete w/u  R pleural based  opacity loculated effusion vs mass - Pulm following in consultation - TCTS has seen and plans an eventual VATS once all other issues stabilized (weeks away)  CAP - L pleural effusion  Cont empiric abx tx - now s/p thoracentesis per PCCM   Newly diagnosed severe systolic CHF W/u underway - tachy mediated (new afib) v/s ischemic - for R and L heart cath Monday - Cards following - cont in attempts to diurese   Probable COPD - Tobacco abuse  Cont usual tx regimen - no formal diagnosis previously as pt has not received prior medical care   Newly diagnosed Afib w RVR On heparin gtt - CCB changed to amio given low EF - Cards following - TSH normal   Code Status: FULL Family Communication: no family present at time of exam Disposition Plan: SDU   Consultants: [facility]  PCCM TCTS  Procedures: 2/4 TTE - EF 20-25% - diffuse hypokinesis - moderate RAE/RVE; mildly reduced RV function 2/4 LE venous dopplers - negative for DVT   Antibiotics: Azithro 2/4 > 2/5 Rocephin 2/4 > 2/5 Zosyn 2/4 > Vanc 2/4 >  DVT prophylaxis: IV heparin   Objective: Blood pressure 112/72, pulse 64, temperature 98.1 F (36.7 C), temperature source Oral, resp. rate 21, height  (1.753 m), weight 90.719 kg (200 lb), SpO2 95 %.  Intake/Output Summary (Last 24 hours) at 08/15/14 1210 Last data filed at 08/15/14 1200  Gross per 24 hour  Intake 2015.5 ml  Output   2980 ml  Net -964.5 ml    Exam: General: no acute resp distress - alert and oriented  Lungs: poor air movement B bases - no wheeze   Cardiovascular: irreg irreg - rate 80bpm - no appreciable M Abdomen: Nontender, nondistended, soft, bowel sounds positive, no rebound, no ascites, no appreciable mass Extremities: No significant cyanosis, clubbing;  2+ edema bilateral lower extremities  Data Reviewed: Basic Metabolic Panel:  Recent Labs Lab 08/13/14 1255 08/13/14 2048 08/15/14 0256  NA 137  --  135  K 4.4  --  3.7  CL 100  --   94*  CO2 31  --  33*  GLUCOSE 94  --  143*  BUN 13  --  16  CREATININE 0.84 0.92 0.91  CALCIUM 8.5  --  8.3*    Liver Function Tests:  Recent Labs Lab 08/13/14 1255 08/14/14 1906 08/15/14 0256  AST 38*  --  34  ALT 41  --  41  ALKPHOS 76  --  73  BILITOT 0.8  --  0.8  PROT 6.7 6.7 6.7  ALBUMIN 2.6*  --  2.3*   CBC:  Recent Labs Lab 08/13/14 1255 08/13/14 2048 08/14/14 0238 08/15/14 0256  WBC 10.9* 10.7* 9.8 22.5*  NEUTROABS 8.2*  --   --   --   HGB 14.1 14.3 14.6 14.2  HCT 44.5 45.1 46.0 44.2  MCV 98.5 97.8 97.9 96.5  PLT 260 244 252 255   BNP (last 3 results)  Recent Labs  08/13/14 1755  BNP 1223.4*    Recent Results (from the past 240 hour(s))  Blood Culture (routine x 2)     Status: None (Preliminary result)   Collection Time: 08/13/14 12:55 PM  Result Value Ref Range Status   Specimen Description BLOOD FOREARM RIGHT  Final   Special Requests BOTTLES DRAWN AEROBIC AND ANAEROBIC 5CC  Final   Culture   Final           BLOOD CULTURE RECEIVED NO GROWTH TO DATE CULTURE WILL BE HELD FOR 5 DAYS BEFORE ISSUING A FINAL NEGATIVE REPORT Performed at [facility]    Report Status PENDING  Incomplete  Blood Culture (routine x 2)     Status: None (Preliminary result)   Collection Time: 08/13/14  1:00 PM  Result Value Ref Range Status   Specimen Description BLOOD LEFT ANTECUBITAL  Final   Special Requests BOTTLES DRAWN AEROBIC AND ANAEROBIC 10MLS  Final   Culture   Final           BLOOD CULTURE RECEIVED NO GROWTH TO DATE CULTURE WILL BE HELD FOR 5 DAYS BEFORE ISSUING A FINAL NEGATIVE REPORT Performed at [facility]    Report Status PENDING  Incomplete  Urine culture     Status: None   Collection Time: 08/13/14  1:30 PM  Result Value Ref Range Status   Specimen Description URINE, CLEAN CATCH  Final   Special Requests NONE  Final   Colony Count NO GROWTH Performed at [facility]   Final   Culture NO GROWTH Performed at [facility]   Final   Report Status 08/14/2014 FINAL  Final  MRSA PCR Screening     Status: None   Collection Time: 08/13/14  7:18 PM  Result Value Ref Range Status   MRSA by PCR NEGATIVE NEGATIVE Final    Comment:        The GeneXpert MRSA Assay (FDA approved for NASAL specimens only), is one component of a comprehensive MRSA colonization surveillance program. It is not intended to diagnose MRSA infection nor to guide or  monitor treatment for MRSA infections.   Culture, sputum-assessment     Status: None   Collection Time: 08/13/14 11:18 PM  Result Value Ref Range Status   Specimen Description SPUTUM  Final   Special Requests NONE  Final   Sputum evaluation   Final    MICROSCOPIC FINDINGS SUGGEST THAT THIS SPECIMEN IS NOT REPRESENTATIVE OF LOWER RESPIRATORY SECRETIONS. PLEASE RECOLLECT. RESULT CALLED TO, READ BACK BY AND VERIFIED WITH: A. WEATHERFORD RN [phone] GREEN R    Report Status 08/14/2014 FINAL  Final  Body fluid culture     Status: None (Preliminary result)   Collection Time: 08/14/14  4:26 PM  Result Value Ref Range Status   Specimen Description PLEURAL FLUID LEFT  Final   Special Requests NONE  Final   Gram Stain   Final    RARE WBC PRESENT,BOTH PMN AND MONONUCLEAR NO ORGANISMS SEEN Performed at [facility]    Culture NO GROWTH Performed at [facility]   Final   Report Status PENDING  Incomplete     Studies:  Recent x-ray studies have been reviewed in detail by the Attending Physician  Scheduled Meds:  Scheduled Meds: . aspirin  81 mg Oral Daily  . atorvastatin  40 mg Oral q1800  . bisoprolol  5 mg Oral Daily  . furosemide  40 mg Intravenous 3 times per day  . ipratropium  0.5 mg Nebulization BID  . levalbuterol  0.63 mg Nebulization BID  . lisinopril  5 mg Oral Daily  . methylPREDNISolone (SOLU-MEDROL) injection  60 mg Intravenous Q12H  . nicotine  21 mg Transdermal Daily  . piperacillin-tazobactam (ZOSYN)  IV  3.375 g  Intravenous Q8H  . vancomycin  1,000 mg Intravenous Q8H    Time spent on care of this patient: 35 mins   Amani Marseille T , MD   [facility] Office  [phone] Pager - Text Page per Loretha Stapler as per below:  On-Call/Text Page:      [email or web address]      password [email or web address]  If 7PM-7AM, please contact night-coverage [email or web address] Password [email or web address] 08/15/2014, 12:10 PM   LOS: 2 days

## 2014-08-15 NOTE — Consult Note (Signed)
Name: Jason StallingJarrett Kotas MRN: [id number] DOB: April 25, 1951    ADMISSION DATE:  08/13/2014 CONSULTATION DATE:  08/15/2014  REFERRING MD :  Thedore MinsSingh  CHIEF COMPLAINT:  CAP, stable pleural opacity  BRIEF PATIENT DESCRIPTION: 64 [facility].o. M [facility] after seeing PCP and found to have new onset A.fib vs A.flutter with RVR.  Also has CAP, started on abx 1 week ago but no resolution in symptoms.  CXR in ED revealed stable right sided pleural based opacity concerning for effusion vs mass.  PCCM consulted for further recs.  SIGNIFICANT EVENTS  2/4 - saw PCP in f/u after seeing urgent care 1 week prior, EKG with new onset A.flutter / A.fib, instructed to come to ED for further eval. 2/5 TCTS consult for loculated right effusion  STUDIES:  CXR 2/4 >>> increased left perihilar opacity concerning for PNA, stable pleural based opacity in right lateral chest wall most c/w loculated effusion.  Recommend CT chest to r/u mass. CT chest 2/4 > R sided apical loculated and anterior effusion with intralobular segment, apparent free flowing effusion on left with multiple areas of consolidation; emphysema noted bilaterally    SUBJECTIVE:  Feels better with less dyspnea. No cough   VITAL SIGNS: Temp:  [97.5 F (36.4 C)-98.2 F (36.8 C)] 97.5 F (36.4 C) (02/06 1150) Pulse Rate:  [64-133] 83 (02/06 1300) Resp:  [12-27] 20 (02/06 1300) BP: (112-139)/(67-83) 121/79 mmHg (02/06 1300) SpO2:  [92 %-99 %] 97 % (02/06 1300)  PHYSICAL EXAMINATION: General: no distress HEENT: NCAT, EOmi PULM: diminished left base, few crackles, CT R CV: Irreg irreg AB: BS+, soft Ext: warm, edema noted Neuro: A&Ox4, maew    Recent Labs Lab 08/13/14 1255 08/13/14 2048 08/15/14 0256  NA 137  --  135  K 4.4  --  3.7  CL 100  --  94*  CO2 31  --  33*  BUN 13  --  16  CREATININE 0.84 0.92 0.91  GLUCOSE 94  --  143*    Recent Labs Lab 08/13/14 2048 08/14/14 0238 08/15/14 0256  HGB 14.3 14.6 14.2  HCT 45.1 46.0 44.2   WBC 10.7* 9.8 22.5*  PLT 244 252 255    ASSESSMENT / PLAN:  Right loculated effusion> right sided effusion is loculated,  TCTS consult appreciated  Suspect chronic. Consider elective right VATS in few weeks after acute PNA  On left.   Left effusion> with multilobar pneumonia on left most likely parapneumonic s/p thoracentesis on 2/5  Check CXR in am.  Follow cytology and cx data .  Cont ABX  Follow temp/wbc   New onset A.fib vs A.flutter CHF Recs: Per [facility], Cardiology    Calene Paradiso NP-C  [facility]  [phone]

## 2014-08-16 ENCOUNTER — Inpatient Hospital Stay (HOSPITAL_COMMUNITY): Payer: BLUE CROSS/BLUE SHIELD

## 2014-08-16 LAB — HEPARIN LEVEL (UNFRACTIONATED)
HEPARIN UNFRACTIONATED: 0.9 [IU]/mL — AB (ref 0.30–0.70)
HEPARIN UNFRACTIONATED: 0.66 [IU]/mL (ref 0.30–0.70)
Heparin Unfractionated: 0.72 IU/mL — ABNORMAL HIGH (ref 0.30–0.70)

## 2014-08-16 LAB — CBC
HCT: 46 % (ref 39.0–52.0)
Hemoglobin: 14.6 g/dL (ref 13.0–17.0)
MCH: 31.1 pg (ref 26.0–34.0)
MCHC: 31.7 g/dL (ref 30.0–36.0)
MCV: 97.9 fL (ref 78.0–100.0)
PLATELETS: 254 10*3/uL (ref 150–400)
RBC: 4.7 MIL/uL (ref 4.22–5.81)
RDW: 12.8 % (ref 11.5–15.5)
WBC: 20.1 10*3/uL — ABNORMAL HIGH (ref 4.0–10.5)

## 2014-08-16 LAB — BASIC METABOLIC PANEL
ANION GAP: 11 (ref 5–15)
BUN: 16 mg/dL (ref 6–23)
CO2: 36 mmol/L — ABNORMAL HIGH (ref 19–32)
CREATININE: 1.01 mg/dL (ref 0.50–1.35)
Calcium: 8.2 mg/dL — ABNORMAL LOW (ref 8.4–10.5)
Chloride: 92 mmol/L — ABNORMAL LOW (ref 96–112)
GFR, EST NON AFRICAN AMERICAN: 78 mL/min — AB (ref >90)
Glucose, Bld: 127 mg/dL — ABNORMAL HIGH (ref 70–99)
Potassium: 3.4 mmol/L — ABNORMAL LOW (ref 3.5–5.1)
Sodium: 139 mmol/L (ref 135–145)

## 2014-08-16 MED ORDER — SODIUM CHLORIDE 0.9 % IJ SOLN
3.0000 mL | Freq: Two times a day (BID) | INTRAMUSCULAR | Status: DC
  Administered 2014-08-16 – 2014-08-17 (×2): 3 mL via INTRAVENOUS

## 2014-08-16 MED ORDER — POTASSIUM CHLORIDE CRYS ER 20 MEQ PO TBCR
40.0000 meq | EXTENDED_RELEASE_TABLET | Freq: Once | ORAL | Status: AC
  Administered 2014-08-16: 40 meq via ORAL
  Filled 2014-08-16: qty 2

## 2014-08-16 MED ORDER — SODIUM CHLORIDE 0.9 % IV SOLN: 250.0000 mL | INTRAVENOUS | Status: DC | PRN

## 2014-08-16 MED ORDER — ASPIRIN 81 MG PO CHEW
81.0000 mg | CHEWABLE_TABLET | ORAL | Status: AC
  Administered 2014-08-17: 81 mg via ORAL
  Filled 2014-08-16: qty 1

## 2014-08-16 MED ORDER — LISINOPRIL 5 MG PO TABS
5.0000 mg | ORAL_TABLET | Freq: Two times a day (BID) | ORAL | Status: DC
  Administered 2014-08-16 – 2014-08-17 (×3): 5 mg via ORAL
  Filled 2014-08-16 (×6): qty 1

## 2014-08-16 MED ORDER — SODIUM CHLORIDE 0.9 % IJ SOLN: 3.0000 mL | INTRAMUSCULAR | Status: DC | PRN

## 2014-08-16 MED ORDER — SPIRONOLACTONE 12.5 MG HALF TABLET
12.5000 mg | ORAL_TABLET | Freq: Every day | ORAL | Status: DC
  Administered 2014-08-16: 12.5 mg via ORAL
  Filled 2014-08-16 (×2): qty 1

## 2014-08-16 NOTE — Progress Notes (Signed)
Patient ID: Jason Spencer, male   DOB: 23-May-1951, 64 y.o.   MRN: [id number]   SUBJECTIVE: Remains in atrial fibrillation this morning.  Denies chest pain, says breathing continues to improve.  He diuresed well yesterday.    Has had left-sided thoracentesis, suspect parapneumonic effusion.  Has loculated right-sided effusion, evaluated by Dr Dorris Fetch, plan for VATS after antibiotic treatment of CAP in a few weeks.   Echo with EF 20-25%, moderately dilated RV with mildly decreased systolic function.   Scheduled Meds: . aspirin  81 mg Oral Daily  . atorvastatin  40 mg Oral q1800  . bisoprolol  5 mg Oral Daily  . furosemide  40 mg Intravenous 3 times per day  . ipratropium  0.5 mg Nebulization BID  . levalbuterol  0.63 mg Nebulization BID  . lisinopril  5 mg Oral BID  . methylPREDNISolone (SOLU-MEDROL) injection  40 mg Intravenous Q12H  . nicotine  21 mg Transdermal Daily  . piperacillin-tazobactam (ZOSYN)  IV  3.375 g Intravenous Q8H  . potassium chloride  40 mEq Oral Once  . spironolactone  12.5 mg Oral Daily  . vancomycin  1,000 mg Intravenous Q8H   Continuous Infusions: . sodium chloride 10 mL/hr at 08/16/14 0900  . amiodarone 30 mg/hr (08/16/14 0900)  . heparin 1,500 Units/hr (08/16/14 0958)   PRN Meds:.levalbuterol    Filed Vitals:   08/15/14 2300 08/16/14 0000 08/16/14 0400 08/16/14 0927  BP: 126/93 125/93 135/80 131/70  Pulse: 62 79 63 68  Temp: 97.9 F (36.6 C)  98.2 F (36.8 C) 97.3 F (36.3 C)  TempSrc: Oral  Oral Oral  Resp: Height:      Weight:   182 lb 1.6 oz (82.6 kg)   SpO2: 99% 99% 97% 97%    Intake/Output Summary (Last 24 hours) at 08/16/14 1116 Last data filed at 08/16/14 0900  Gross per 24 hour  Intake 2635.3 ml  Output   5425 ml  Net -2789.7 ml    LABS: Basic Metabolic Panel:  Recent Labs  40/98/11 0256 08/16/14 0410  NA 135 139  K 3.7 3.4*  CL 94* 92*  CO2 33* 36*  GLUCOSE 143* 127*  BUN 16 16  CREATININE 0.91  1.01  CALCIUM 8.3* 8.2*   Liver Function Tests:  Recent Labs  08/13/14 1255 08/14/14 1906 08/15/14 0256  AST 38*  --  34  ALT 41  --  41  ALKPHOS 76  --  73  BILITOT 0.8  --  0.8  PROT 6.7 6.7 6.7  ALBUMIN 2.6*  --  2.3*   No results for input(s): LIPASE, AMYLASE in the last 72 hours. CBC:  Recent Labs  08/13/14 1255  08/15/14 0256 08/16/14 0410  WBC 10.9*  < > 22.5* 20.1*  NEUTROABS 8.2*  --   --   --   HGB 14.1  < > 14.2 14.6  HCT 44.5  < > 44.2 46.0  MCV 98.5  < > 96.5 97.9  PLT 260  < > 255 254  < > = values in this interval not displayed. Cardiac Enzymes: No results for input(s): CKTOTAL, CKMB, CKMBINDEX, TROPONINI in the last 72 hours. BNP: Invalid input(s): POCBNP D-Dimer: No results for input(s): DDIMER in the last 72 hours. Hemoglobin A1C: No results for input(s): HGBA1C in the last 72 hours. Fasting Lipid Panel:  Recent Labs  08/14/14 0238  CHOL 167  HDL 69  LDLCALC 89  TRIG 43  CHOLHDL 2.4  Thyroid Function Tests:  Recent Labs  08/13/14 2048  TSH 3.034   Anemia Panel: No results for input(s): VITAMINB12, FOLATE, FERRITIN, TIBC, IRON, RETICCTPCT in the last 72 hours.  RADIOLOGY: Ct Chest W Contrast  08/13/2014   CLINICAL DATA:  Diagnosed with pneumonia 6 days ago, shortness of breath, cough, weakness, smoker  EXAM: CT CHEST WITH CONTRAST  TECHNIQUE: Multidetector CT imaging of the chest was performed during intravenous contrast administration. Sagittal and coronal MPR images reconstructed from axial data set.  CONTRAST:  100mL OMNIPAQUE IOHEXOL 300 MG/ML  SOLN IV  COMPARISON:  Chest radiograph 08/13/2014  FINDINGS: Scattered atherosclerotic calcifications aorta and coronary arteries.  Aortic normal caliber.  Pulmonary arteries well opacified and grossly patent on nondedicated exam.  Scattered normal size thoracic lymph nodes.  Cardiac chambers appear enlarged.  Pleural calcifications posterior medial RIGHT hemi thorax.  Visualized upper abdomen  unremarkable.  Loculated RIGHT pleural effusion.  Dependent LEFT pleural effusion.  Minimal scattered atelectasis in RIGHT lung with more significant compressive atelectasis in LEFT lower lobe.  Infiltrates identified in LEFT upper and LEFT lower lobes, particularly peripherally in the LEFT upper lobe where focal density is seen which could represent rounded atelectasis or pneumonia.  Emphysematous changes at apices and scattered peribronchial thickening.  No pneumothorax or acute osseous findings.  IMPRESSION: Emphysematous and bronchitic changes question COPD.  BILATERAL pleural effusions, loculated on RIGHT, with scattered infiltrates in LEFT upper and LEFT lower lobes.  Followup until resolution recommended to exclude underlying abnormalities particularly in the periphery of the LEFT upper lobe.  Nonspecific pleural calcifications in the RIGHT hemi thorax, could be related to prior hemothorax, empyema, less likely TB or asbestos exposure due to unilateral involvement.   Electronically Signed   By: Ulyses SouthwardMark  Boles M.D.   On: 08/13/2014 17:30   Dg Chest Port 1 View  08/16/2014   CLINICAL DATA:  Pneumonia  EXAM: PORTABLE CHEST - 1 VIEW  COMPARISON:  08/14/2014  FINDINGS: Mild patchy left upper lobe opacity, unchanged.  Stable pleural based mass in the lateral right hemithorax, corresponding to loculated pleural effusion on CT.  No pneumothorax.  Cardiomegaly.  IMPRESSION: Mild patchy left upper lobe opacity, suspicious for pneumonia, unchanged.  Stable pleural-based mass in the lateral right hemithorax, corresponding to loculated pleural effusion on CT.   Electronically Signed   By: Charline BillsSriyesh  Krishnan M.D.   On: 08/16/2014 09:05   Dg Chest Port 1 View  08/14/2014   CLINICAL DATA:  Shortness of breath, status post thoracentesis  EXAM: PORTABLE CHEST - 1 VIEW  COMPARISON:  CT chest dated 08/13/2014  FINDINGS: Pleural-based opacity along the lateral right hemithorax, corresponding to a loculated pleural effusion on CT.   Patchy opacity in the left upper lobe, suspicious for pneumonia, although improving when compared to prior chest radiographs.  No pneumothorax status post thoracentesis.  Cardiomegaly.  IMPRESSION: No pneumothorax status post thoracentesis.  Otherwise unchanged.   Electronically Signed   By: Charline BillsSriyesh  Krishnan M.D.   On: 08/14/2014 19:15   Dg Chest Port 1 View  08/13/2014   CLINICAL DATA:  Atrial fibrillation.  EXAM: PORTABLE CHEST - 1 VIEW  COMPARISON:  August 07, 2014.  FINDINGS: Stable cardiomediastinal silhouette. No pneumothorax is noted. Stable pleural opacity is seen along right lateral chest wall most consistent with loculated effusion, but mass cannot be excluded. Fluid is also noted in the right minor fissure. Increased left perihilar opacity is noted concerning for pneumonia or possibly edema. Bony thorax is intact.  IMPRESSION:  Increased left perihilar opacity is noted concerning for pneumonia or possibly edema.  Stable pleural-based opacity is noted along right lateral chest wall most consistent with loculated pleural effusion, with probable loculated fluid within the right minor fissure is well. However, CT scan of the chest is recommended to rule out pleural-based mass.   Electronically Signed   By: Roque Lias M.D.   On: 08/13/2014 14:00    PHYSICAL EXAM General: NAD Neck: JVP 8-9 cm cm, no thyromegaly or thyroid nodule.  Lungs: Crackles left base, decreased breath sounds right base.  CV: Lateral PMI.  Heart irregular S1/S2, no S3/S4, no murmur.  1+ edema ankles.   Abdomen: Soft, nontender, no hepatosplenomegaly, no distention.  Neurologic: Alert and oriented x 3.  Psych: Normal affect. Extremities: No clubbing or cyanosis.   TELEMETRY: Reviewed telemetry pt in atrial fibrillation 90s  ASSESSMENT AND PLAN: 64 yo with history of smoking presented with dyspnea and was found to be in atrial fibrillation with RVR and PNA + effusions on CXR.  He was clinically in CHF as well.  1.  Atrial fibrillation: With RVR at admission.  Not sure how long he has been in atrial fibrillation. EF is low, cannot rule out tachy-mediated cardiomyopathy.  I think that we need to get him out of atrial fibrillation.  Given low EF, I think that our best chance long-term is likely with an antiarrhythmic perhaps considering atrial fibrillation ablation as well. - He is on amiodarone gtt. Amiodarone will increase our chances of getting/keeping him in NSR.  However, given his lung problems, this may not be a good long-term option.   - Will use bisoprolol as well (beta-1 selective given concern for COPD).  - Planning for cath Monday.  Continue heparin gtt until cath.  Will start Eliquis after cath, after 5 doses can do TEE-DCCV (depending on what's found on cath).  2. Acute systolic CHF: EF 16-10% with mildly decreased RV systolic function.  Volume status is improving on exam, remains mildly overloaded today with good diuresis yesterday.  ? Cause for CMP: could be tachy-mediated, but he also has RFs for CAD.  CAD seems a strong possibility with smoking history.  -  LHC/RHC Monday.  -  Bisoprolol 5 mg daily. -  Increase lisinopril to 5 mg bid and add spironolactone 12.5 mg daily.  -  Will diurese with IV Lasix today, suspect can switch over to po diuretics after today.  Will stop Lasix after today for cath, and future plan will depend on RHC numbers.  - Added ASA and statin given concern for CAD.  3. ID: Suspect CAP with parapneumonic effusion on left.  Currently on vanco/Zosyn.  He also has a right loculated effusion.  Plan per Dr Dorris Fetch to treat with abx then in a few weeks (would have to be at least 4 wks post-DCCV), plan VATS on right.  He is afebrile.  WBCs high but on Solumedrol.  4. Smoking: Needs to quit.  Possible COPD. He is on Solumedrol, ?start weaning (per primary service).   Marca Ancona 08/16/2014 11:16 AM

## 2014-08-16 NOTE — ED Provider Notes (Signed)
CSN: [id number]     Arrival date & time 08/13/14  1213 History   First MD Initiated Contact with Patient 08/13/14 1214     Chief Complaint  Patient presents with  . Atrial Fibrillation     (Consider location/radiation/quality/duration/timing/severity/associated sxs/prior Treatment) Patient is a 64 y.o. male presenting with shortness of breath. The history is provided by the patient.  Shortness of Breath Severity:  Moderate Onset quality:  Gradual Timing:  Constant Progression:  Worsening Chronicity:  New Relieved by:  Nothing Worsened by:  Nothing tried Ineffective treatments: antibiotics. Associated symptoms: cough   Associated symptoms: no abdominal pain, no fever and no vomiting     Past Medical History  Diagnosis Date  . Atrial fibrillation with RVR 08/13/2014    new onset/notes 08/13/2014  . CAP (community acquired pneumonia) 07/2014  . Acute CHF     [name] 08/13/2014  . Migraine 1970     "don't know why; they just left and haven't come back"   Past Surgical History  Procedure Laterality Date  . No past surgeries     History reviewed. No pertinent family history. History  Substance Use Topics  . Smoking status: Current Every Day Smoker -- 0.50 packs/day for 44 years    Types: Cigarettes  . Smokeless tobacco: Never Used  . Alcohol Use: 0.0 oz/week    0 Not specified per week     Comment: 08/13/2014 "I haven't drank in a month or so; if I drink it would be beer on a warm day"    Review of Systems  Constitutional: Negative for fever and chills.  Respiratory: Positive for cough and shortness of breath.   Gastrointestinal: Negative for vomiting and abdominal pain.  All other systems reviewed and are negative.     Allergies  Review of patient's allergies indicates no known allergies.  Home Medications   Prior to Admission medications   Medication Sig Start Date End Date Taking? Authorizing Provider  Albuterol Sulfate (PROAIR HFA IN) Inhale 2 puffs into the lungs  4 (four) times daily.    Yes Historical Provider, MD  ibuprofen (ADVIL,MOTRIN) 200 MG tablet Take 400 mg by mouth daily as needed for mild pain or moderate pain.   Yes Historical Provider, MD  levofloxacin (LEVAQUIN) 500 MG tablet Take 500 mg by mouth 2 (two) times daily.   Yes Historical Provider, MD  Menthol, Topical Analgesic, (ICY HOT EX) Apply 1 application topically daily as needed (knee pain).   Yes Historical Provider, MD  Tetrahydrozoline HCl (VISINE OP) Apply 2 drops to eye daily as needed (red eyes).   Yes Historical Provider, MD   BP 125/93 mmHg  Pulse 79  Temp(Src) 97.9 F (36.6 C) (Oral)  Resp 15  Ht  (1.753 m)  Wt 183 lb 10.3 oz (83.3 kg)  BMI 27.11 kg/m2  SpO2 99% Physical Exam  Constitutional: He is oriented to person, place, and time. He appears well-developed and well-nourished. No distress.  HENT:  Head: Normocephalic and atraumatic.  Mouth/Throat: Oropharynx is clear and moist. No oropharyngeal exudate.  Eyes: EOM are normal. Pupils are equal, round, and reactive to light.  Neck: Normal range of motion. Neck supple.  Cardiovascular: Regular rhythm.  Tachycardia present.  Exam reveals no friction rub.   No murmur heard. Pulmonary/Chest: Breath sounds normal. Tachypnea noted. He is in respiratory distress (mild). He has no wheezes. He has no rales.  Abdominal: Soft. He exhibits no distension. There is no tenderness. There is no rebound.  Musculoskeletal:  Normal range of motion. He exhibits no edema.  Neurological: He is alert and oriented to person, place, and time. No cranial nerve deficit. He exhibits normal muscle tone.  Skin: No rash noted. He is not diaphoretic.  Nursing note and vitals reviewed.   ED Course  Procedures (including critical care time) Labs Review Labs Reviewed  CBC WITH DIFFERENTIAL/PLATELET - Abnormal; Notable for the following:    WBC 10.9 (*)    Neutro Abs 8.2 (*)    Monocytes Absolute 1.1 (*)    All other components within  normal limits  COMPREHENSIVE METABOLIC PANEL - Abnormal; Notable for the following:    Albumin 2.6 (*)    AST 38 (*)    All other components within normal limits  BRAIN NATRIURETIC PEPTIDE - Abnormal; Notable for the following:    B Natriuretic Peptide 1223.4 (*)    All other components within normal limits  HEPARIN LEVEL (UNFRACTIONATED) - Abnormal; Notable for the following:    Heparin Unfractionated 0.23 (*)    All other components within normal limits  CBC - Abnormal; Notable for the following:    WBC 10.7 (*)    All other components within normal limits  CREATININE, SERUM - Abnormal; Notable for the following:    GFR calc non Af Amer 89 (*)    All other components within normal limits  HEPARIN LEVEL (UNFRACTIONATED) - Abnormal; Notable for the following:    Heparin Unfractionated 0.13 (*)    All other components within normal limits  LACTATE DEHYDROGENASE, BODY FLUID - Abnormal; Notable for the following:    LD, Fluid 67 (*)    All other components within normal limits  BODY FLUID CELL COUNT WITH DIFFERENTIAL - Abnormal; Notable for the following:    Color, Fluid YELLOW (*)    Appearance, Fluid HAZY (*)    WBC, Fluid 1599 (*)    Monocyte-Macrophage-Serous Fluid 22 (*)    All other components within normal limits  CBC - Abnormal; Notable for the following:    WBC 22.5 (*)    All other components within normal limits  COMPREHENSIVE METABOLIC PANEL - Abnormal; Notable for the following:    Chloride 94 (*)    CO2 33 (*)    Glucose, Bld 143 (*)    Calcium 8.3 (*)    Albumin 2.3 (*)    GFR calc non Af Amer 90 (*)    All other components within normal limits  HEPARIN LEVEL (UNFRACTIONATED) - Abnormal; Notable for the following:    Heparin Unfractionated 0.26 (*)    All other components within normal limits  CULTURE, BLOOD (ROUTINE X 2)  CULTURE, BLOOD (ROUTINE X 2)  URINE CULTURE  MRSA PCR SCREENING  CULTURE, EXPECTORATED SPUTUM-ASSESSMENT  AFB CULTURE WITH SMEAR  BODY  FLUID CULTURE  FUNGUS CULTURE W SMEAR  GRAM STAIN  URINALYSIS, ROUTINE W REFLEX MICROSCOPIC  PROCALCITONIN  HEPARIN LEVEL (UNFRACTIONATED)  LIPID PANEL  CBC  LEGIONELLA ANTIGEN, URINE  STREP PNEUMONIAE URINARY ANTIGEN  TSH  HEPARIN LEVEL (UNFRACTIONATED)  PROTEIN, BODY FLUID  LACTATE DEHYDROGENASE  PROTEIN, TOTAL  PH, BODY FLUID  PROCALCITONIN  HEPARIN LEVEL (UNFRACTIONATED)  HEPARIN LEVEL (UNFRACTIONATED)  VANCOMYCIN, TROUGH  HIV ANTIBODY (ROUTINE TESTING)  HIV ANTIBODY (ROUTINE TESTING)  CBC  HEPARIN LEVEL (UNFRACTIONATED)  BASIC METABOLIC PANEL  I-STAT CG4 LACTIC ACID, ED  I-STAT CG4 LACTIC ACID, ED  CYTOLOGY - NON PAP    Imaging Review Dg Chest Port 1 View  08/14/2014   CLINICAL DATA:  Shortness  of breath, status post thoracentesis  EXAM: PORTABLE CHEST - 1 VIEW  COMPARISON:  CT chest dated 08/13/2014  FINDINGS: Pleural-based opacity along the lateral right hemithorax, corresponding to a loculated pleural effusion on CT.  Patchy opacity in the left upper lobe, suspicious for pneumonia, although improving when compared to prior chest radiographs.  No pneumothorax status post thoracentesis.  Cardiomegaly.  IMPRESSION: No pneumothorax status post thoracentesis.  Otherwise unchanged.   Electronically Signed   By: Charline BillsSriyesh  Krishnan M.D.   On: 08/14/2014 19:15     EKG Interpretation   Date/Time:  Thursday August 13 2014 12:22:20 EST Ventricular Rate:  136 PR Interval:    QRS Duration: 109 QT Interval:  336 QTC Calculation: 505 R Axis:   111 Text Interpretation:  Atrial fibrillation Consider RVH w/ secondary repol  abnormality Abnormal lateral Q waves Prolonged QT interval No prior for  comparison Confirmed by Gwendolyn GrantWALDEN  MD, Jaicion Laurie ([id number]) on 08/13/2014 12:56:40 PM      MDM   Final diagnoses:  Pneumonia  Atrial fibrillation, new onset  Bilateral lower extremity edema  S/P thoracentesis    49M here with SOB, sent from his PCP's office for worsening SOB. Has been on  antibiotics. Persistent coughing here. Tachypneic, tachycardic. CXR concerning for worsening pneumonia. Antibiotics given, admitted.    Elwin MochaBlair Rinnah Peppel, MD 08/16/14 [phone]

## 2014-08-16 NOTE — Progress Notes (Signed)
[facility] TEAM 1 - Stepdown/ICU TEAM Progress Note  Jason Spencer ZOX:[id number] DOB: 26-Aug-1950 DOA: 08/13/2014 PCP: Rudi Heap, MD  Admit HPI / Brief Narrative: 64 y.o. Male with no significant past medical history except for tobacco abuse stating it has been "years" since he has been to see a physician was sent to the [facility] emergency department from [facility]. Patient recently was treated for community-acquired pneumonia with Levaquin. Was instructed at that time to establish with a primary care physician. Upon presentation to the PCP office he was found to have a rapid irregular heart rate and was sent directly to [facility] ER via EMS.   Upon arrival to the emergency department patient was found to be in atrial fibrillation with RVR and was bolused and subsequently started on a Cardizem infusion. Heart rate has decreased from 137 to 108 but he remained in atrial fibrillation. Chest x-ray revealed increased left perihilar opacity concerning for pneumonia or possibly edema. There was also a stable pleural-based opacity in the right lateral chest wall most consistent with loculated pleural effusion. Patient had mild leukocytosis and was given empiric antibiotics to cover for pneumonia. Patient stated he has had upper respiratory symptoms for 1-1/2 months. He initially took some leftover Zithromax from a friend. He was seen at the urgent care on the 29th and was started on antibiotics with some improvement in his symptoms. For several weeks he noticed increasing lower extremity edema, orthopnea and mild dyspnea on exertion. He has noticed the edema has worsened over the past 48 hours.   HPI/Subjective: Pt is resting comfortably at present.  He denies current CP, n/v, sob, or abdom pain.  He is in good spirits and is ready to proceed w/ a cardiac cath.    Assessment/Plan:  Acute hypoxic respiratory failure - CAP + acute COPD exac + CHF exac  tx each individual issue  and complete w/u  R pleural based loculated effusion  Pulm following in consultation - TCTS has seen and plans an eventual VATS once all other issues stabilized (weeks away)  CAP - L pleural effusion  Cont empiric abx tx - now s/p thoracentesis per PCCM   Newly diagnosed severe systolic CHF - EF 20-25% W/u underway - tachy mediated (new afib) v/s ischemic - for R and L heart cath Monday - Cards following - cont in attempts to diurese - wgt at admit 90.7kg - current weight 82.6 - net negative ~7L presently   Probable COPD - Tobacco abuse  Cont usual tx regimen - no formal diagnosis previously as pt has not received prior medical care   Newly diagnosed Afib w RVR On heparin gtt - CCB changed to amio given low EF - Cards following - TSH normal - rate currently well controlled   Mild hypokalemia Due to diuresis - replace to goal of 4.0   Code Status: FULL Family Communication: no family present at time of exam Disposition Plan: SDU   Consultants: [facility]  PCCM TCTS  Procedures: 2/4 TTE - EF 20-25% - diffuse hypokinesis - moderate RAE/RVE; mildly reduced RV function 2/4 LE venous dopplers - negative for DVT   Antibiotics: Azithro 2/4 > 2/5 Rocephin 2/4 > 2/5 Zosyn 2/4 > Vanc 2/4 >  DVT prophylaxis: IV heparin   Objective: Blood pressure 131/70, pulse 68, temperature 97.3 F (36.3 C), temperature source Oral, resp. rate 20, height  (1.753 m), weight 82.6 kg (182 lb 1.6 oz), SpO2 97 %.  Intake/Output Summary (Last  24 hours) at 08/16/14 1111 Last data filed at 08/16/14 0900  Gross per 24 hour  Intake 2635.3 ml  Output   5425 ml  Net -2789.7 ml    Exam: General: no acute resp distress - alert and oriented   Lungs: poor air movement B bases - no wheeze - good air movement th/o other fields   Cardiovascular: irreg irreg - rate controlled - no appreciable M Abdomen: Nontender, nondistended, soft, bowel sounds positive, no rebound, no ascites, no appreciable  mass Extremities: No significant cyanosis, clubbing;  2+ edema bilateral lower extremities but improved   Data Reviewed: Basic Metabolic Panel:  Recent Labs Lab 08/13/14 1255 08/13/14 2048 08/15/14 0256 08/16/14 0410  NA 137  --  135 139  K 4.4  --  3.7 3.4*  CL 100  --  94* 92*  CO2 31  --  33* 36*  GLUCOSE 94  --  143* 127*  BUN 13  --  16 16  CREATININE 0.84 0.92 0.91 1.01  CALCIUM 8.5  --  8.3* 8.2*    Liver Function Tests:  Recent Labs Lab 08/13/14 1255 08/14/14 1906 08/15/14 0256  AST 38*  --  34  ALT 41  --  41  ALKPHOS 76  --  73  BILITOT 0.8  --  0.8  PROT 6.7 6.7 6.7  ALBUMIN 2.6*  --  2.3*   CBC:  Recent Labs Lab 08/13/14 1255 08/13/14 2048 08/14/14 0238 08/15/14 0256 08/16/14 0410  WBC 10.9* 10.7* 9.8 22.5* 20.1*  NEUTROABS 8.2*  --   --   --   --   HGB 14.1 14.3 14.6 14.2 14.6  HCT 44.5 45.1 46.0 44.2 46.0  MCV 98.5 97.8 97.9 96.5 97.9  PLT 260 244 252 255 254   BNP (last 3 results)  Recent Labs  08/13/14 1755  BNP 1223.4*    Recent Results (from the past 240 hour(s))  Blood Culture (routine x 2)     Status: None (Preliminary result)   Collection Time: 08/13/14 12:55 PM  Result Value Ref Range Status   Specimen Description BLOOD FOREARM RIGHT  Final   Special Requests BOTTLES DRAWN AEROBIC AND ANAEROBIC 5CC  Final   Culture   Final           BLOOD CULTURE RECEIVED NO GROWTH TO DATE CULTURE WILL BE HELD FOR 5 DAYS BEFORE ISSUING A FINAL NEGATIVE REPORT Performed at [facility]    Report Status PENDING  Incomplete  Blood Culture (routine x 2)     Status: None (Preliminary result)   Collection Time: 08/13/14  1:00 PM  Result Value Ref Range Status   Specimen Description BLOOD LEFT ANTECUBITAL  Final   Special Requests BOTTLES DRAWN AEROBIC AND ANAEROBIC  Final   Culture   Final           BLOOD CULTURE RECEIVED NO GROWTH TO DATE CULTURE WILL BE HELD FOR 5 DAYS BEFORE ISSUING A FINAL NEGATIVE REPORT Performed at  [facility]    Report Status PENDING  Incomplete  Urine culture     Status: None   Collection Time: 08/13/14  1:30 PM  Result Value Ref Range Status   Specimen Description URINE, CLEAN CATCH  Final   Special Requests NONE  Final   Colony Count NO GROWTH Performed at [facility]   Final   Culture NO GROWTH Performed at [facility]   Final   Report Status 08/14/2014 FINAL  Final  MRSA PCR Screening     Status: None   Collection Time: 08/13/14  7:18 PM  Result Value Ref Range Status   MRSA by PCR NEGATIVE NEGATIVE Final    Comment:        The GeneXpert MRSA Assay (FDA approved for NASAL specimens only), is one component of a comprehensive MRSA colonization surveillance program. It is not intended to diagnose MRSA infection nor to guide or monitor treatment for MRSA infections.   Culture, sputum-assessment     Status: None   Collection Time: 08/13/14 11:18 PM  Result Value Ref Range Status   Specimen Description SPUTUM  Final   Special Requests NONE  Final   Sputum evaluation   Final    MICROSCOPIC FINDINGS SUGGEST THAT THIS SPECIMEN IS NOT REPRESENTATIVE OF LOWER RESPIRATORY SECRETIONS. PLEASE RECOLLECT. RESULT CALLED TO, READ BACK BY AND VERIFIED WITH: A. WEATHERFORD RN [phone] GREEN R    Report Status 08/14/2014 FINAL  Final  AFB culture with smear     Status: None (Preliminary result)   Collection Time: 08/14/14  4:25 PM  Result Value Ref Range Status   Specimen Description PLEURAL FLUID LEFT  Final   Special Requests NONE  Final   Acid Fast Smear   Final    NO ACID FAST BACILLI SEEN Performed at [facility]    Culture   Final    CULTURE WILL BE EXAMINED FOR 6 WEEKS BEFORE ISSUING A FINAL REPORT Performed at [facility]    Report Status PENDING  Incomplete  Body fluid culture     Status: None (Preliminary result)   Collection Time: 08/14/14  4:26 PM  Result Value Ref Range Status   Specimen Description PLEURAL  FLUID LEFT  Final   Special Requests NONE  Final   Gram Stain   Final    RARE WBC PRESENT,BOTH PMN AND MONONUCLEAR NO ORGANISMS SEEN Performed at [facility]    Culture   Final    NO GROWTH 1 DAY Performed at [facility]    Report Status PENDING  Incomplete  Fungus Culture with Smear     Status: None (Preliminary result)   Collection Time: 08/14/14  4:26 PM  Result Value Ref Range Status   Specimen Description FLUID PLEURAL LEFT  Final   Special Requests NONE  Final   Fungal Smear   Final    NO YEAST OR FUNGAL ELEMENTS SEEN Performed at [facility]    Culture   Final    CULTURE IN PROGRESS FOR FOUR WEEKS Performed at [facility]    Report Status PENDING  Incomplete     Studies:  Recent x-ray studies have been reviewed in detail by the Attending Physician  Scheduled Meds:  Scheduled Meds: . aspirin  81 mg Oral Daily  . atorvastatin  40 mg Oral q1800  . bisoprolol  5 mg Oral Daily  . furosemide  40 mg Intravenous 3 times per day  . ipratropium  0.5 mg Nebulization BID  . levalbuterol  0.63 mg Nebulization BID  . lisinopril  5 mg Oral BID  . methylPREDNISolone (SOLU-MEDROL) injection  40 mg Intravenous Q12H  . nicotine  21 mg Transdermal Daily  . piperacillin-tazobactam (ZOSYN)  IV  3.375 g Intravenous Q8H  . potassium chloride  40 mEq Oral Once  . spironolactone  12.5 mg Oral Daily  . vancomycin  1,000 mg Intravenous Q8H    Time spent on care of this patient: 35 mins  Lonia Blood , MD   [facility] Office  [phone] Pager - Text Page per Amion as per below:  On-Call/Text Page:      [email or web address]      password [email or web address]  If 7PM-7AM, please contact night-coverage [email or web address] Password [email or web address] 08/16/2014, 11:11 AM   LOS: 3 days

## 2014-08-16 NOTE — Progress Notes (Addendum)
ANTICOAGULATION CONSULT NOTE - Follow Up Consult  Pharmacy Consult for Heparin  Indication: atrial fibrillation  No Known Allergies  Patient Measurements: Height: 5\' 9"  (175.3 cm) Weight: 182 lb 1.6 oz (82.6 kg) IBW/kg (Calculated) : 70.7  Vital Signs: Temp: 98.2 F (36.8 C) (02/07 0400) Temp Source: Oral (02/07 0400) BP: 135/80 mmHg (02/07 0400) Pulse Rate: 63 (02/07 0400)  Labs:  Recent Labs  08/13/14 2048 08/14/14 0238  08/15/14 0256 08/15/14 1028 08/16/14 0410  HGB 14.3 14.6  --  14.2  --  14.6  HCT 45.1 46.0  --  44.2  --  46.0  PLT 244 252  --  255  --  254  HEPARINUNFRC 0.33 0.23*  < > 0.47 0.68 0.90*  CREATININE 0.92  --   --  0.91  --  1.01  < > = values in this interval not displayed.  Estimated Creatinine Clearance: 76.8 mL/min (by C-G formula based on Cr of 1.01).  Assessment: 64yo M presented on 2/4 with SOB, wheezing, and cough. Found to be in Afib with RVR at admission.  Heparin level remains slightly supratherapeutic  Goal of Therapy:  Heparin level 0.3-0.7 units/ml Monitor platelets by anticoagulation protocol: Yes   Plan:  -Decrease heparin to 1400 units/hr -2100 heparin level -Daily CBC/HL -Monitor for bleeding  Waynette Butteryegan K. Bryannah Boston, PharmD Clinical Pharmacy Resident Pager: [phone] 08/16/2014 2:38 PM

## 2014-08-16 NOTE — Progress Notes (Signed)
ANTICOAGULATION CONSULT NOTE - Follow Up Consult  Pharmacy Consult for Heparin  Indication: atrial fibrillation  No Known Allergies  Patient Measurements: Height: 5\' 9"  (175.3 cm) Weight: 182 lb 1.6 oz (82.6 kg) IBW/kg (Calculated) : 70.7  Vital Signs: Temp: 97.4 F (36.3 C) (02/07 2005) Temp Source: Oral (02/07 2005) BP: 155/90 mmHg (02/07 2012) Pulse Rate: 77 (02/07 2005)  Labs:  Recent Labs  08/14/14 0238  08/15/14 0256  08/16/14 0410 08/16/14 1228 08/16/14 2042  HGB 14.6  --  14.2  --  14.6  --   --   HCT 46.0  --  44.2  --  46.0  --   --   PLT 252  --  255  --  254  --   --   HEPARINUNFRC 0.23*  < > 0.47  < > 0.90* 0.72* 0.66  CREATININE  --   --  0.91  --  1.01  --   --   < > = values in this interval not displayed.  Estimated Creatinine Clearance: 76.8 mL/min (by C-G formula based on Cr of 1.01).  . sodium chloride 10 mL/hr at 08/16/14 1800  . amiodarone 30 mg/hr (08/16/14 1800)  . heparin 1,400 Units/hr (08/16/14 1800)     Assessment: 63yo M presented on 2/4 with SOB, wheezing, and cough. Found to be in Afib with RVR at admission.  Heparin level now therapeutic on 1400 units/hr.  No bleeding or complications noted.  Goal of Therapy:  Heparin level 0.3-0.7 units/ml Monitor platelets by anticoagulation protocol: Yes   Plan:  -Continue IV heparin 1400 units/hr. -Daily CBC/HL -Monitor for bleeding  Tad MooreJessica Lamyra Malcolm, Pharm D, BCPS  Clinical Pharmacist Pager [phone]  08/16/2014 9:15 PM

## 2014-08-16 NOTE — Progress Notes (Signed)
  Subjective: Still has a cough but decreasing Feels better   Objective: Vital signs in last 24 hours: Temp:  [97.3 F (36.3 C)-98.2 F (36.8 C)] 97.8 F (36.6 C) (02/07 1200) Pulse Rate:  [60-117] 78 (02/07 1200) Cardiac Rhythm:  [-] Atrial fibrillation (02/07 1200) Resp:  [15-22] 17 (02/07 1200) BP: (118-136)/(63-93) 131/70 mmHg (02/07 0927) SpO2:  [95 %-100 %] 97 % (02/07 0927) Weight:  [182 lb 1.6 oz (82.6 kg)-183 lb 10.3 oz (83.3 kg)] 182 lb 1.6 oz (82.6 kg) (02/07 0400)  Hemodynamic parameters for last 24 hours:    Intake/Output from previous day: 02/06 0701 - 02/07 0700 In: 2209.8 [P.O.:640; I.V.:819.8; IV Piggyback:750] Out: 4725 [Urine:4725] Intake/Output this shift: Total I/O In: 856.6 [P.O.:240; I.V.:616.6] Out: 1300 [Urine:1300]  General appearance: alert and no distress Neurologic: intact Heart: irregularly irregular rhythm Lungs: rhonchi faint on left  Lab Results:  Recent Labs  08/15/14 0256 08/16/14 0410  WBC 22.5* 20.1*  HGB 14.2 14.6  HCT 44.2 46.0  PLT 255 254   BMET:  Recent Labs  08/15/14 0256 08/16/14 0410  NA 135 139  K 3.7 3.4*  CL 94* 92*  CO2 33* 36*  GLUCOSE 143* 127*  BUN 16 16  CREATININE 0.91 1.01  CALCIUM 8.3* 8.2*    PT/INR: No results for input(s): LABPROT, INR in the last 72 hours. ABG No results found for: PHART, HCO3, TCO2, ACIDBASEDEF, O2SAT CBG (last 3)  No results for input(s): GLUCAP in the last 72 hours.  Assessment/Plan: S/P   -  CAP- on zosyn, WBC elevated but afebrile and clinically improving. CXR shows left upper lobe infiltrate, no recurrence of left pleural effusion. Loculated right effusion unchanged  EF 20-25% by echo- for left and right heart cath tomorrow   LOS: 3 days    Sachiko Methot C 08/16/2014

## 2014-08-17 ENCOUNTER — Encounter (HOSPITAL_COMMUNITY)
Admission: EM | Disposition: A | Discharge status: Home / Self Care | Payer: BLUE CROSS/BLUE SHIELD | Source: Home / Self Care | Attending: Internal Medicine

## 2014-08-17 ENCOUNTER — Encounter (HOSPITAL_COMMUNITY): Payer: Self-pay | Admitting: Interventional Cardiology

## 2014-08-17 DIAGNOSIS — I2510 Atherosclerotic heart disease of native coronary artery without angina pectoris: Secondary | ICD-10-CM

## 2014-08-17 DIAGNOSIS — I509 Heart failure, unspecified: Secondary | ICD-10-CM

## 2014-08-17 HISTORY — PX: LEFT AND RIGHT HEART CATHETERIZATION WITH CORONARY ANGIOGRAM: SHX5449

## 2014-08-17 HISTORY — PX: PERCUTANEOUS CORONARY STENT INTERVENTION (PCI-S): SHX5485

## 2014-08-17 LAB — POCT I-STAT 3, VENOUS BLOOD GAS (G3P V)
Acid-Base Excess: 9 mmol/L — ABNORMAL HIGH (ref 0.0–2.0)
Bicarbonate: 37.3 mEq/L — ABNORMAL HIGH (ref 20.0–24.0)
O2 Saturation: 57 %
PH VEN: 7.372 — AB (ref 7.250–7.300)
TCO2: 39 mmol/L (ref 0–100)
pCO2, Ven: 64.3 mmHg — ABNORMAL HIGH (ref 45.0–50.0)
pO2, Ven: 32 mmHg (ref 30.0–45.0)

## 2014-08-17 LAB — POCT I-STAT 3, ART BLOOD GAS (G3+)
ACID-BASE EXCESS: 8 mmol/L — AB (ref 0.0–2.0)
BICARBONATE: 33.7 meq/L — AB (ref 20.0–24.0)
O2 SAT: 97 %
TCO2: 35 mmol/L (ref 0–100)
pCO2 arterial: 47.4 mmHg — ABNORMAL HIGH (ref 35.0–45.0)
pH, Arterial: 7.46 — ABNORMAL HIGH (ref 7.350–7.450)
pO2, Arterial: 86 mmHg (ref 80.0–100.0)

## 2014-08-17 LAB — BASIC METABOLIC PANEL
Anion gap: 9 (ref 5–15)
BUN: 16 mg/dL (ref 6–23)
CO2: 37 mmol/L — ABNORMAL HIGH (ref 19–32)
CREATININE: 1.06 mg/dL (ref 0.50–1.35)
Calcium: 8.5 mg/dL (ref 8.4–10.5)
Chloride: 91 mmol/L — ABNORMAL LOW (ref 96–112)
GFR calc Af Amer: 86 mL/min — ABNORMAL LOW (ref >90)
GFR calc non Af Amer: 74 mL/min — ABNORMAL LOW (ref >90)
Glucose, Bld: 115 mg/dL — ABNORMAL HIGH (ref 70–99)
Potassium: 4 mmol/L (ref 3.5–5.1)
Sodium: 137 mmol/L (ref 135–145)

## 2014-08-17 LAB — MAGNESIUM: MAGNESIUM: 2.2 mg/dL (ref 1.5–2.5)

## 2014-08-17 LAB — POCT ACTIVATED CLOTTING TIME
ACTIVATED CLOTTING TIME: 687 s
Activated Clotting Time: 804 seconds

## 2014-08-17 LAB — CBC
HCT: 48.3 % (ref 39.0–52.0)
HEMOGLOBIN: 15.1 g/dL (ref 13.0–17.0)
MCH: 31.4 pg (ref 26.0–34.0)
MCHC: 31.3 g/dL (ref 30.0–36.0)
MCV: 100.4 fL — ABNORMAL HIGH (ref 78.0–100.0)
Platelets: 261 10*3/uL (ref 150–400)
RBC: 4.81 MIL/uL (ref 4.22–5.81)
RDW: 12.9 % (ref 11.5–15.5)
WBC: 16.7 10*3/uL — AB (ref 4.0–10.5)

## 2014-08-17 LAB — HEPARIN LEVEL (UNFRACTIONATED): HEPARIN UNFRACTIONATED: 0.56 [IU]/mL (ref 0.30–0.70)

## 2014-08-17 LAB — PROTIME-INR
INR: 1.07 (ref 0.00–1.49)
Prothrombin Time: 14 seconds (ref 11.6–15.2)

## 2014-08-17 LAB — HIV ANTIBODY (ROUTINE TESTING W REFLEX)
HIV SCREEN 4TH GENERATION: NONREACTIVE
HIV SCREEN 4TH GENERATION: NONREACTIVE

## 2014-08-17 LAB — PROCALCITONIN: Procalcitonin: <0.1 ng/mL

## 2014-08-17 SURGERY — LEFT AND RIGHT HEART CATHETERIZATION WITH CORONARY ANGIOGRAM
Anesthesia: LOCAL

## 2014-08-17 MED ORDER — LIDOCAINE HCL (PF) 1 % IJ SOLN
INTRAMUSCULAR | Status: AC
  Filled 2014-08-17: qty 30

## 2014-08-17 MED ORDER — CLOPIDOGREL BISULFATE 300 MG PO TABS
ORAL_TABLET | ORAL | Status: AC
  Filled 2014-08-17: qty 2

## 2014-08-17 MED ORDER — VERAPAMIL HCL 2.5 MG/ML IV SOLN
INTRAVENOUS | Status: AC
  Filled 2014-08-17: qty 2

## 2014-08-17 MED ORDER — MIDAZOLAM HCL 2 MG/2ML IJ SOLN
INTRAMUSCULAR | Status: AC
  Filled 2014-08-17: qty 2

## 2014-08-17 MED ORDER — HEPARIN SODIUM (PORCINE) 1000 UNIT/ML IJ SOLN
INTRAMUSCULAR | Status: AC
  Filled 2014-08-17: qty 1

## 2014-08-17 MED ORDER — BISOPROLOL FUMARATE 5 MG PO TABS
7.5000 mg | ORAL_TABLET | Freq: Every day | ORAL | Status: DC
  Administered 2014-08-17 – 2014-08-21 (×5): 7.5 mg via ORAL
  Filled 2014-08-17 (×5): qty 1.5

## 2014-08-17 MED ORDER — CLOPIDOGREL BISULFATE 75 MG PO TABS
75.0000 mg | ORAL_TABLET | Freq: Every day | ORAL | Status: DC
  Administered 2014-08-18 – 2014-08-21 (×4): 75 mg via ORAL
  Filled 2014-08-17 (×5): qty 1

## 2014-08-17 MED ORDER — SODIUM CHLORIDE 0.9 % IV SOLN
INTRAVENOUS | Status: AC
  Administered 2014-08-17: 19:00:00 via INTRAVENOUS

## 2014-08-17 MED ORDER — FENTANYL CITRATE 0.05 MG/ML IJ SOLN
INTRAMUSCULAR | Status: AC
  Filled 2014-08-17: qty 2

## 2014-08-17 MED ORDER — FUROSEMIDE 40 MG PO TABS
40.0000 mg | ORAL_TABLET | Freq: Every day | ORAL | Status: DC
  Administered 2014-08-17: 40 mg via ORAL
  Filled 2014-08-17 (×2): qty 1

## 2014-08-17 MED ORDER — OXYCODONE-ACETAMINOPHEN 5-325 MG PO TABS: 1.0000 | ORAL_TABLET | ORAL | Status: DC | PRN

## 2014-08-17 MED ORDER — WARFARIN SODIUM 5 MG PO TABS
5.0000 mg | ORAL_TABLET | Freq: Once | ORAL | Status: AC
Start: 2014-08-17 — End: 2014-08-17
  Administered 2014-08-17: 5 mg via ORAL
  Filled 2014-08-17: qty 1

## 2014-08-17 MED ORDER — SPIRONOLACTONE 25 MG PO TABS
25.0000 mg | ORAL_TABLET | Freq: Every day | ORAL | Status: DC
  Administered 2014-08-17 – 2014-08-21 (×5): 25 mg via ORAL
  Filled 2014-08-17 (×5): qty 1

## 2014-08-17 MED ORDER — HEPARIN (PORCINE) IN NACL 100-0.45 UNIT/ML-% IJ SOLN
1400.0000 [IU]/h | INTRAMUSCULAR | Status: DC
  Administered 2014-08-18 (×2): 1400 [IU]/h via INTRAVENOUS
  Filled 2014-08-17 (×4): qty 250

## 2014-08-17 MED ORDER — ONDANSETRON HCL 4 MG/2ML IJ SOLN
4.0000 mg | Freq: Four times a day (QID) | INTRAMUSCULAR | Status: DC | PRN
  Filled 2014-08-17: qty 2

## 2014-08-17 MED ORDER — HEPARIN (PORCINE) IN NACL 2-0.9 UNIT/ML-% IJ SOLN
INTRAMUSCULAR | Status: AC
  Filled 2014-08-17: qty 1500

## 2014-08-17 MED ORDER — BIVALIRUDIN 250 MG IV SOLR
INTRAVENOUS | Status: AC
  Filled 2014-08-17: qty 250

## 2014-08-17 MED ORDER — ACETAMINOPHEN 325 MG PO TABS: 650.0000 mg | ORAL_TABLET | ORAL | Status: DC | PRN

## 2014-08-17 MED ORDER — NITROGLYCERIN 1 MG/10 ML FOR IR/CATH LAB
INTRA_ARTERIAL | Status: AC
  Filled 2014-08-17: qty 10

## 2014-08-17 MED ORDER — WARFARIN - PHARMACIST DOSING INPATIENT
Freq: Every day | Status: DC
  Administered 2014-08-18: 18:00:00

## 2014-08-17 NOTE — Progress Notes (Addendum)
ANTICOAGULATION CONSULT NOTE - Initial Consult  Pharmacy Consult for coumadin Indication: atrial fibrillation  No Known Allergies  Patient Measurements: Height: 5\' 9"  (175.3 cm) Weight: 179 lb 14.3 oz (81.6 kg) IBW/kg (Calculated) : 70.7 Heparin Dosing Weight:   Vital Signs: Temp: 97.9 F (36.6 C) (02/08 1217) Temp Source: Oral (02/08 1217) BP: 94/67 mmHg (02/08 1217)  Labs:  Recent Labs  08/15/14 0256  08/16/14 0410 08/16/14 1228 08/16/14 2042 08/17/14 0405 08/17/14 1310  HGB 14.2  --  14.6  --   --  15.1  --   HCT 44.2  --  46.0  --   --  48.3  --   PLT 255  --  254  --   --  261  --   LABPROT  --   --   --   --   --   --  14.0  INR  --   --   --   --   --   --  1.07  HEPARINUNFRC 0.47  < > 0.90* 0.72* 0.66 0.56  --   CREATININE 0.91  --  1.01  --   --  1.06  --   < > = values in this interval not displayed.  Estimated Creatinine Clearance: 73.2 mL/min (by C-G formula based on Cr of 1.06).   Medical History: Past Medical History  Diagnosis Date  . Atrial fibrillation with RVR 08/13/2014    new onset/notes 08/13/2014  . CAP (community acquired pneumonia) 07/2014  . Acute CHF     [name]/notes 08/13/2014  . Migraine 1970     "don't know why; they just left and haven't come back"    Medications:  Scheduled:  . aspirin  81 mg Oral Daily  . atorvastatin  40 mg Oral q1800  . bisoprolol  7.5 mg Oral Daily  . furosemide  40 mg Oral Q breakfast  . ipratropium  0.5 mg Nebulization BID  . levalbuterol  0.63 mg Nebulization BID  . lisinopril  5 mg Oral BID  . nicotine  21 mg Transdermal Daily  . piperacillin-tazobactam (ZOSYN)  IV  3.375 g Intravenous Q8H  . sodium chloride  3 mL Intravenous Q12H  . spironolactone  25 mg Oral Daily  . vancomycin  1,000 mg Intravenous Q8H   Infusions:  . sodium chloride 10 mL/hr at 08/17/14 1300  . amiodarone 30 mg/hr (08/17/14 1300)  . heparin 1,400 Units/hr (08/17/14 1300)    Assessment: 64 yo male with afib will be started on  coumadin.  Patient is also on heparin at this time. INR 1.07 today  Goal of Therapy:  INR 2-3 Monitor platelets by anticoagulation protocol: Yes   Plan:  - coumadin 5 mg po x1 - INR in am  Tanyah Debruyne, Tsz-Yin 08/17/2014,4:36 PM

## 2014-08-17 NOTE — CV Procedure (Signed)
     PERCUTANEOUS CORONARY INTERVENTION   Jason Spencer is a 64 y.o. male  INDICATION: Systolic heart failure patient with severe distal RCA disease identified at catheterization by Dr. Aundra Dubin who requested stenting of the RCA given the patient's poor LV function and large territory supplied by the right coronary.   PROCEDURE: Overlapping bare metal stent distal right coronary   CONSENT: The risks, benefits, and details of the procedure were explained to the patient. Risks including death, MI, stroke, bleeding, limb ischemia, emergency CABG, renal failure and allergy were described and accepted by the patient.  Informed written consent was obtained prior to proceeding.  PROCEDURE TECHNIQUE:  After Xylocaine anesthesia a 5 French Slender sheath was in place from the diagnostic procedure in the right radial artery.   The diagnostic procedure was performed by Dr. Aundra Dubin using this access route. Coronary guiding shots were made using a JR4 6 French catheter. Antithrombotic therapy, bivalirudin bolus and infusion, was begun and determined to be therapeutic by ACT. Antiplatelet therapy, 600 mg of Plavix, was loaded.  A 0.014 intracoronary Pro-water wire was used to cross the stenosis in the distal RCA. Predilatation with a 20 mm long by 2.5 mm compliant balloon was performed. We then positioned and deployed a 3.0 x 38 mm MultiLink Bare-metal stent. We then overlapped the proximal margin with a 12 x 3.0 mm MultiLink bare-metal stent. Postdilatation was performed with a 3.0 balloon to 14 atm in overlapping fashion. 0% stenosis was noted from the origin of the PDA back to the mid segment of the RCA just distal to the large acute marginal branch.  EQUIPMENT: 0.014 Pro-water guidewire; JR 4, 6 [place] guide catheter; 2.5 x 20 mm Euphora; 3.0 x 38 mm Multi-link bare-metal stent; 3.0 x 12 mm MultiLink bare-metal stent; 3.0 x 12 mm [place] Euphora balloon.  CONTRAST:  Total of 95 cc.  COMPLICATIONS:  none.     ANGIOGRAPHIC RESULTS:   Diffusely diseased distal to mid RCA reduced from 95% to 0% with TIMI grade 3 flow. Final balloon diameter 3.0 mm.   IMPRESSIONS:  Successful angioplasty of the distal RCA reducing the 95% stenosis to 0% with TIMI grade 3 flow.   RECOMMENDATION:  Aspirin and Plavix for at least 4 weeks following which one of the antiplatelet agents, preferably aspirin can be discontinued. I will continue Plavix and warfarin for 11 months of possible.  Further management per Dr. Aundra Dubin.    Sinclair Grooms, MD 08/17/2014 5:09 PM

## 2014-08-17 NOTE — Progress Notes (Addendum)
Patient ID: Jason Spencer, male   DOB: [birth year], 64 y.o.   MRN: [id number]   SUBJECTIVE: Remains in atrial fibrillation this morning.  Denies chest pain, says breathing continues to improve.  Weight down another 3 lbs.     Has had left-sided thoracentesis, suspect parapneumonic effusion.  Has loculated right-sided effusion, evaluated by Dr Dorris Fetch, plan for VATS after antibiotic treatment of CAP in a few weeks.   Echo with EF 20-25%, moderately dilated RV with mildly decreased systolic function.   Scheduled Meds: . aspirin  81 mg Oral Daily  . atorvastatin  40 mg Oral q1800  . bisoprolol  7.5 mg Oral Daily  . furosemide  40 mg Oral Q breakfast  . ipratropium  0.5 mg Nebulization BID  . levalbuterol  0.63 mg Nebulization BID  . lisinopril  5 mg Oral BID  . nicotine  21 mg Transdermal Daily  . piperacillin-tazobactam (ZOSYN)  IV  3.375 g Intravenous Q8H  . sodium chloride  3 mL Intravenous Q12H  . spironolactone  25 mg Oral Daily  . vancomycin  1,000 mg Intravenous Q8H   Continuous Infusions: . sodium chloride 10 mL/hr at 08/16/14 1800  . amiodarone 30 mg/hr (08/17/14 0538)  . heparin 1,400 Units/hr (08/17/14 0329)   PRN Meds:.sodium chloride, levalbuterol, sodium chloride    Filed Vitals:   08/16/14 2005 08/16/14 2012 08/17/14 0000 08/17/14 0400  BP: 155/90 155/90 92/74 135/97  Pulse: 77  34   Temp: 97.4 F (36.3 C)  98.3 F (36.8 C)   TempSrc: Oral  Oral   Resp: Height:      Weight:    179 lb 14.3 oz (81.6 kg)  SpO2: 95%  100%     Intake/Output Summary (Last 24 hours) at 08/17/14 0726 Last data filed at 08/17/14 0617  Gross per 24 hour  Intake 2729.32 ml  Output   5525 ml  Net -2795.68 ml    LABS: Basic Metabolic Panel:  Recent Labs  40/98/11 0410 08/17/14 0405  NA 139 137  K 3.4* 4.0  CL 92* 91*  CO2 36* 37*  GLUCOSE 127* 115*  BUN 16 16  CREATININE 1.01 1.06  CALCIUM 8.2* 8.5  MG  --  2.2   Liver Function Tests:  Recent  Labs  08/14/14 1906 08/15/14 0256  AST  --  34  ALT  --  41  ALKPHOS  --  73  BILITOT  --  0.8  PROT 6.7 6.7  ALBUMIN  --  2.3*   No results for input(s): LIPASE, AMYLASE in the last 72 hours. CBC:  Recent Labs  08/16/14 0410 08/17/14 0405  WBC 20.1* 16.7*  HGB 14.6 15.1  HCT 46.0 48.3  MCV 97.9 100.4*  PLT 254 261   Cardiac Enzymes: No results for input(s): CKTOTAL, CKMB, CKMBINDEX, TROPONINI in the last 72 hours. BNP: Invalid input(s): POCBNP D-Dimer: No results for input(s): DDIMER in the last 72 hours. Hemoglobin A1C: No results for input(s): HGBA1C in the last 72 hours. Fasting Lipid Panel: No results for input(s): CHOL, HDL, LDLCALC, TRIG, CHOLHDL, LDLDIRECT in the last 72 hours. Thyroid Function Tests: No results for input(s): TSH, T4TOTAL, T3FREE, THYROIDAB in the last 72 hours.  Invalid input(s): FREET3 Anemia Panel: No results for input(s): VITAMINB12, FOLATE, FERRITIN, TIBC, IRON, RETICCTPCT in the last 72 hours.  RADIOLOGY: Ct Chest W Contrast  08/13/2014   CLINICAL DATA:  Diagnosed with pneumonia 6 days ago, shortness of breath, cough, weakness, smoker  EXAM: CT CHEST WITH CONTRAST  TECHNIQUE: Multidetector CT imaging of the chest was performed during intravenous contrast administration. Sagittal and coronal MPR images reconstructed from axial data set.  CONTRAST:  100mL OMNIPAQUE IOHEXOL 300 MG/ML  SOLN IV  COMPARISON:  Chest radiograph 08/13/2014  FINDINGS: Scattered atherosclerotic calcifications aorta and coronary arteries.  Aortic normal caliber.  Pulmonary arteries well opacified and grossly patent on nondedicated exam.  Scattered normal size thoracic lymph nodes.  Cardiac chambers appear enlarged.  Pleural calcifications posterior medial RIGHT hemi thorax.  Visualized upper abdomen unremarkable.  Loculated RIGHT pleural effusion.  Dependent LEFT pleural effusion.  Minimal scattered atelectasis in RIGHT lung with more significant compressive atelectasis in  LEFT lower lobe.  Infiltrates identified in LEFT upper and LEFT lower lobes, particularly peripherally in the LEFT upper lobe where focal density is seen which could represent rounded atelectasis or pneumonia.  Emphysematous changes at apices and scattered peribronchial thickening.  No pneumothorax or acute osseous findings.  IMPRESSION: Emphysematous and bronchitic changes question COPD.  BILATERAL pleural effusions, loculated on RIGHT, with scattered infiltrates in LEFT upper and LEFT lower lobes.  Followup until resolution recommended to exclude underlying abnormalities particularly in the periphery of the LEFT upper lobe.  Nonspecific pleural calcifications in the RIGHT hemi thorax, could be related to prior hemothorax, empyema, less likely TB or asbestos exposure due to unilateral involvement.   Electronically Signed   By: Ulyses SouthwardMark  Boles M.D.   On: 08/13/2014 17:30   Dg Chest Port 1 View  08/16/2014   CLINICAL DATA:  Pneumonia  EXAM: PORTABLE CHEST - 1 VIEW  COMPARISON:  08/14/2014  FINDINGS: Mild patchy left upper lobe opacity, unchanged.  Stable pleural based mass in the lateral right hemithorax, corresponding to loculated pleural effusion on CT.  No pneumothorax.  Cardiomegaly.  IMPRESSION: Mild patchy left upper lobe opacity, suspicious for pneumonia, unchanged.  Stable pleural-based mass in the lateral right hemithorax, corresponding to loculated pleural effusion on CT.   Electronically Signed   By: Charline BillsSriyesh  Krishnan M.D.   On: 08/16/2014 09:05   Dg Chest Port 1 View  08/14/2014   CLINICAL DATA:  Shortness of breath, status post thoracentesis  EXAM: PORTABLE CHEST - 1 VIEW  COMPARISON:  CT chest dated 08/13/2014  FINDINGS: Pleural-based opacity along the lateral right hemithorax, corresponding to a loculated pleural effusion on CT.  Patchy opacity in the left upper lobe, suspicious for pneumonia, although improving when compared to prior chest radiographs.  No pneumothorax status post thoracentesis.   Cardiomegaly.  IMPRESSION: No pneumothorax status post thoracentesis.  Otherwise unchanged.   Electronically Signed   By: Charline BillsSriyesh  Krishnan M.D.   On: 08/14/2014 19:15   Dg Chest Port 1 View  08/13/2014   CLINICAL DATA:  Atrial fibrillation.  EXAM: PORTABLE CHEST - 1 VIEW  COMPARISON:  August 07, 2014.  FINDINGS: Stable cardiomediastinal silhouette. No pneumothorax is noted. Stable pleural opacity is seen along right lateral chest wall most consistent with loculated effusion, but mass cannot be excluded. Fluid is also noted in the right minor fissure. Increased left perihilar opacity is noted concerning for pneumonia or possibly edema. Bony thorax is intact.  IMPRESSION: Increased left perihilar opacity is noted concerning for pneumonia or possibly edema.  Stable pleural-based opacity is noted along right lateral chest wall most consistent with loculated pleural effusion, with probable loculated fluid within the right minor fissure is well. However, CT scan of the chest is recommended to rule out pleural-based mass.   Electronically Signed  By: Roque Lias M.D.   On: 08/13/2014 14:00    PHYSICAL EXAM General: NAD Neck: JVP 8 cm cm, no thyromegaly or thyroid nodule.  Lungs: Crackles left base, decreased breath sounds right base.  CV: Lateral PMI.  Heart irregular S1/S2, no S3/S4, no murmur.  1+ edema ankles.   Abdomen: Soft, nontender, no hepatosplenomegaly, no distention.  Neurologic: Alert and oriented x 3.  Psych: Normal affect. Extremities: No clubbing or cyanosis.   TELEMETRY: Reviewed telemetry pt in atrial fibrillation 80s-90s  ASSESSMENT AND PLAN: 64 yo with history of smoking presented with dyspnea and was found to be in atrial fibrillation with RVR and PNA + effusions on CXR.  He was clinically in CHF as well.  1. Atrial fibrillation: With RVR at admission.  Not sure how long he has been in atrial fibrillation. EF is low, cannot rule out tachy-mediated cardiomyopathy.  I think that we  need to get him out of atrial fibrillation.  Given low EF, I think that our best chance long-term is likely with an antiarrhythmic perhaps considering atrial fibrillation ablation as well. - He is on amiodarone gtt. Amiodarone will increase our chances of getting/keeping him in NSR.  However, given his lung problems, this may not be a good long-term option.   - Will use bisoprolol as well (beta-1 selective given concern for COPD).  - Planning for cath Today.  Continue heparin gtt until cath.  Will start Eliquis after cath, after 5 doses can do TEE-DCCV (depending on what's found on cath).  2. Acute systolic CHF: EF 29-56% with mildly decreased RV systolic function.  Volume status is improving on exam, much better compared to a couple of days ago. Weight down another 3 lbs.  ? Cause for CMP: could be tachy-mediated, but he also has RFs for CAD.  CAD seems a strong possibility with smoking history.  -  LHC/RHC today.  I discussed risks and benefits of the procedure with patient today, he understands and agrees to proceed.  -  Increase bisoprolol to 7.5 mg daily. -  Increase spironolactone to 25 mg daily and continue lisinopril.  -  Transition to Lasix 40 mg po daily today, will see on RHC if more aggressive diuresis is needed.  -  Added ASA and statin given concern for CAD.  3. ID: Suspect CAP with parapneumonic effusion on left.  Currently on vanco/Zosyn.  He also has a right loculated effusion.  Plan per Dr Dorris Fetch to treat with abx then in a few weeks (would have to be at least 4 wks post-DCCV), plan VATS on right.  He is afebrile.  WBCs high but coming down after stopping Solumedrol.  4. Smoking: Needs to quit.  Possible COPD. Steroids stopped yesterday.  Marca Ancona 08/17/2014 7:26 AM

## 2014-08-17 NOTE — Care Management Note (Signed)
    Page 1 of 1   08/17/2014     1:37:47 PM CARE MANAGEMENT NOTE 08/17/2014  Patient:  Jason Spencer,Jason Spencer   Account Number:  [id number]  Date Initiated:  08/14/2014  Documentation initiated by:  Junius CreamerWELL,Jason  Subjective/Objective Assessment:   adm w resp failure     Action/Plan:   lives w fam, pcp dr don Christell Constantmoore   Anticipated DC Date:     Anticipated DC Plan:  HOME/SELF CARE      DC Planning Services  CM consult  Medication Assistance      Choice offered to / List presented to:             Status of service:   Medicare Important Message given?   (If response is "NO", the following Medicare IM given date fields will be blank) Date Medicare IM given:   Medicare IM given by:   Date Additional Medicare IM given:   Additional Medicare IM given by:    Discharge Disposition:    Per UR Regulation:  Reviewed for med. necessity/level of care/duration of stay  If discussed at Long Length of Stay Meetings, dates discussed:   08/18/2014    Comments:  2/8 1336 Jason [name] rn,bsn left pt 30day free eliquis card. left 10.00 per month copay card. pt has [facility] ins.

## 2014-08-17 NOTE — H&P (View-Only) (Signed)
Patient ID: Jason Spencer, male   DOB: 04-24-1951, 64 y.o.   MRN: [id number]   SUBJECTIVE: Remains in atrial fibrillation this morning.  Denies chest pain, says breathing continues to improve.  Weight down another 3 lbs.     Has had left-sided thoracentesis, suspect parapneumonic effusion.  Has loculated right-sided effusion, evaluated by Dr Dorris Fetch, plan for VATS after antibiotic treatment of CAP in a few weeks.   Echo with EF 20-25%, moderately dilated RV with mildly decreased systolic function.   Scheduled Meds: . aspirin  81 mg Oral Daily  . atorvastatin  40 mg Oral q1800  . bisoprolol  7.5 mg Oral Daily  . furosemide  40 mg Oral Q breakfast  . ipratropium  0.5 mg Nebulization BID  . levalbuterol  0.63 mg Nebulization BID  . lisinopril  5 mg Oral BID  . nicotine  21 mg Transdermal Daily  . piperacillin-tazobactam (ZOSYN)  IV  3.375 g Intravenous Q8H  . sodium chloride  3 mL Intravenous Q12H  . spironolactone  25 mg Oral Daily  . vancomycin  1,000 mg Intravenous Q8H   Continuous Infusions: . sodium chloride 10 mL/hr at 08/16/14 1800  . amiodarone 30 mg/hr (08/17/14 0538)  . heparin 1,400 Units/hr (08/17/14 0329)   PRN Meds:.sodium chloride, levalbuterol, sodium chloride    Filed Vitals:   08/16/14 2005 08/16/14 2012 08/17/14 0000 08/17/14 0400  BP: 155/90 155/90 92/74 135/97  Pulse: 77  34   Temp: 97.4 F (36.3 C)  98.3 F (36.8 C)   TempSrc: Oral  Oral   Resp: Height:      Weight:    179 lb 14.3 oz (81.6 kg)  SpO2: 95%  100%     Intake/Output Summary (Last 24 hours) at 08/17/14 0726 Last data filed at 08/17/14 0617  Gross per 24 hour  Intake 2729.32 ml  Output   5525 ml  Net -2795.68 ml    LABS: Basic Metabolic Panel:  Recent Labs  11/91/47 0410 08/17/14 0405  NA 139 137  K 3.4* 4.0  CL 92* 91*  CO2 36* 37*  GLUCOSE 127* 115*  BUN 16 16  CREATININE 1.01 1.06  CALCIUM 8.2* 8.5  MG  --  2.2   Liver Function Tests:  Recent  Labs  08/14/14 1906 08/15/14 0256  AST  --  34  ALT  --  41  ALKPHOS  --  73  BILITOT  --  0.8  PROT 6.7 6.7  ALBUMIN  --  2.3*   No results for input(s): LIPASE, AMYLASE in the last 72 hours. CBC:  Recent Labs  08/16/14 0410 08/17/14 0405  WBC 20.1* 16.7*  HGB 14.6 15.1  HCT 46.0 48.3  MCV 97.9 100.4*  PLT 254 261   Cardiac Enzymes: No results for input(s): CKTOTAL, CKMB, CKMBINDEX, TROPONINI in the last 72 hours. BNP: Invalid input(s): POCBNP D-Dimer: No results for input(s): DDIMER in the last 72 hours. Hemoglobin A1C: No results for input(s): HGBA1C in the last 72 hours. Fasting Lipid Panel: No results for input(s): CHOL, HDL, LDLCALC, TRIG, CHOLHDL, LDLDIRECT in the last 72 hours. Thyroid Function Tests: No results for input(s): TSH, T4TOTAL, T3FREE, THYROIDAB in the last 72 hours.  Invalid input(s): FREET3 Anemia Panel: No results for input(s): VITAMINB12, FOLATE, FERRITIN, TIBC, IRON, RETICCTPCT in the last 72 hours.  RADIOLOGY: Ct Chest W Contrast  08/13/2014   CLINICAL DATA:  Diagnosed with pneumonia 6 days ago, shortness of breath, cough, weakness, smoker  EXAM: CT CHEST WITH CONTRAST  TECHNIQUE: Multidetector CT imaging of the chest was performed during intravenous contrast administration. Sagittal and coronal MPR images reconstructed from axial data set.  CONTRAST:  100mL OMNIPAQUE IOHEXOL 300 MG/ML  SOLN IV  COMPARISON:  Chest radiograph 08/13/2014  FINDINGS: Scattered atherosclerotic calcifications aorta and coronary arteries.  Aortic normal caliber.  Pulmonary arteries well opacified and grossly patent on nondedicated exam.  Scattered normal size thoracic lymph nodes.  Cardiac chambers appear enlarged.  Pleural calcifications posterior medial RIGHT hemi thorax.  Visualized upper abdomen unremarkable.  Loculated RIGHT pleural effusion.  Dependent LEFT pleural effusion.  Minimal scattered atelectasis in RIGHT lung with more significant compressive atelectasis in  LEFT lower lobe.  Infiltrates identified in LEFT upper and LEFT lower lobes, particularly peripherally in the LEFT upper lobe where focal density is seen which could represent rounded atelectasis or pneumonia.  Emphysematous changes at apices and scattered peribronchial thickening.  No pneumothorax or acute osseous findings.  IMPRESSION: Emphysematous and bronchitic changes question COPD.  BILATERAL pleural effusions, loculated on RIGHT, with scattered infiltrates in LEFT upper and LEFT lower lobes.  Followup until resolution recommended to exclude underlying abnormalities particularly in the periphery of the LEFT upper lobe.  Nonspecific pleural calcifications in the RIGHT hemi thorax, could be related to prior hemothorax, empyema, less likely TB or asbestos exposure due to unilateral involvement.   Electronically Signed   By: Ulyses SouthwardMark  Boles M.D.   On: 08/13/2014 17:30   Dg Chest Port 1 View  08/16/2014   CLINICAL DATA:  Pneumonia  EXAM: PORTABLE CHEST - 1 VIEW  COMPARISON:  08/14/2014  FINDINGS: Mild patchy left upper lobe opacity, unchanged.  Stable pleural based mass in the lateral right hemithorax, corresponding to loculated pleural effusion on CT.  No pneumothorax.  Cardiomegaly.  IMPRESSION: Mild patchy left upper lobe opacity, suspicious for pneumonia, unchanged.  Stable pleural-based mass in the lateral right hemithorax, corresponding to loculated pleural effusion on CT.   Electronically Signed   By: Charline BillsSriyesh  Krishnan M.D.   On: 08/16/2014 09:05   Dg Chest Port 1 View  08/14/2014   CLINICAL DATA:  Shortness of breath, status post thoracentesis  EXAM: PORTABLE CHEST - 1 VIEW  COMPARISON:  CT chest dated 08/13/2014  FINDINGS: Pleural-based opacity along the lateral right hemithorax, corresponding to a loculated pleural effusion on CT.  Patchy opacity in the left upper lobe, suspicious for pneumonia, although improving when compared to prior chest radiographs.  No pneumothorax status post thoracentesis.   Cardiomegaly.  IMPRESSION: No pneumothorax status post thoracentesis.  Otherwise unchanged.   Electronically Signed   By: Charline BillsSriyesh  Krishnan M.D.   On: 08/14/2014 19:15   Dg Chest Port 1 View  08/13/2014   CLINICAL DATA:  Atrial fibrillation.  EXAM: PORTABLE CHEST - 1 VIEW  COMPARISON:  August 07, 2014.  FINDINGS: Stable cardiomediastinal silhouette. No pneumothorax is noted. Stable pleural opacity is seen along right lateral chest wall most consistent with loculated effusion, but mass cannot be excluded. Fluid is also noted in the right minor fissure. Increased left perihilar opacity is noted concerning for pneumonia or possibly edema. Bony thorax is intact.  IMPRESSION: Increased left perihilar opacity is noted concerning for pneumonia or possibly edema.  Stable pleural-based opacity is noted along right lateral chest wall most consistent with loculated pleural effusion, with probable loculated fluid within the right minor fissure is well. However, CT scan of the chest is recommended to rule out pleural-based mass.   Electronically Signed  By: Roque LiasJames  Green M.D.   On: 08/13/2014 14:00    PHYSICAL EXAM General: NAD Neck: JVP 8 cm cm, no thyromegaly or thyroid nodule.  Lungs: Crackles left base, decreased breath sounds right base.  CV: Lateral PMI.  Heart irregular S1/S2, no S3/S4, no murmur.  1+ edema ankles.   Abdomen: Soft, nontender, no hepatosplenomegaly, no distention.  Neurologic: Alert and oriented x 3.  Psych: Normal affect. Extremities: No clubbing or cyanosis.   TELEMETRY: Reviewed telemetry pt in atrial fibrillation 80s-90s  ASSESSMENT AND PLAN: 64 yo with history of smoking presented with dyspnea and was found to be in atrial fibrillation with RVR and PNA + effusions on CXR.  He was clinically in CHF as well.  1. Atrial fibrillation: With RVR at admission.  Not sure how long he has been in atrial fibrillation. EF is low, cannot rule out tachy-mediated cardiomyopathy.  I think that we  need to get him out of atrial fibrillation.  Given low EF, I think that our best chance long-term is likely with an antiarrhythmic perhaps considering atrial fibrillation ablation as well. - He is on amiodarone gtt. Amiodarone will increase our chances of getting/keeping him in NSR.  However, given his lung problems, this may not be a good long-term option.   - Will use bisoprolol as well (beta-1 selective given concern for COPD).  - Planning for cath Today.  Continue heparin gtt until cath.  Will start Eliquis after cath, after 5 doses can do TEE-DCCV (depending on what's found on cath).  2. Acute systolic CHF: EF 16-10%20-25% with mildly decreased RV systolic function.  Volume status is improving on exam, much better compared to a couple of days ago. Weight down another 3 lbs.  ? Cause for CMP: could be tachy-mediated, but he also has RFs for CAD.  CAD seems a strong possibility with smoking history.  -  LHC/RHC today.  -  Increase bisoprolol to 7.5 mg daily. -  Increase spironolactone to 25 mg daily and continue lisinopril.  -  Transition to Lasix 40 mg po daily today, will see on RHC if more aggressive diuresis is needed.  -  Added ASA and statin given concern for CAD.  3. ID: Suspect CAP with parapneumonic effusion on left.  Currently on vanco/Zosyn.  He also has a right loculated effusion.  Plan per Dr Dorris FetchHendrickson to treat with abx then in a few weeks (would have to be at least 4 wks post-DCCV), plan VATS on right.  He is afebrile.  WBCs high but coming down after stopping Solumedrol.  4. Smoking: Needs to quit.  Possible COPD. Steroids stopped yesterday.  Marca AnconaDalton Lajuana Patchell 08/17/2014 7:26 AM

## 2014-08-17 NOTE — Progress Notes (Signed)
08/17/14  Pharmacy- Heparin 1815  A/P:  64yo male s/p cath to resume Heparin 8hr after sheath pulled, done at 1707.  He will be bridging to Coumadin.  Heparin level was therapeutic at 0.56 this AM on 1400 units/hr with Hg and pltc wnl.    Resume Heparin 1400 units/hr, at 1AM on 2/9 Check HL 8hr after resumed Daily HL, CBC  Marisue HumbleKendra Kiffany Schelling, PharmD Clinical Pharmacist [facility]

## 2014-08-17 NOTE — Progress Notes (Signed)
[place] TEAM 1 - Stepdown/ICU TEAM Progress Note  Jason Spencer ZOX:[id number] DOB: 06-16-1951 DOA: 08/13/2014 PCP: Rudi Heap, MD  Admit HPI / Brief Narrative: 64 y.o. Male with no significant past medical history except for tobacco abuse stating it has been "years" since he has been to see a physician was sent to the [facility] emergency department from [facility]. Patient recently was treated for community-acquired pneumonia with Levaquin. Was instructed at that time to establish with a primary care physician. Upon presentation to the PCP office he was found to have a rapid irregular heart rate and was sent directly to [facility] ER via EMS.   Upon arrival to the emergency department patient was found to be in atrial fibrillation with RVR and was bolused and subsequently started on a Cardizem infusion. Heart rate has decreased from 137 to 108 but he remained in atrial fibrillation. Chest x-ray revealed increased left perihilar opacity concerning for pneumonia or possibly edema. There was also a stable pleural-based opacity in the right lateral chest wall most consistent with loculated pleural effusion. Patient had mild leukocytosis and was given empiric antibiotics to cover for pneumonia. Patient stated he has had upper respiratory symptoms for 1-1/2 months. He initially took some leftover Zithromax from a friend. He was seen at the urgent care on the 29th and was started on antibiotics with some improvement in his symptoms. For several weeks he noticed increasing lower extremity edema, orthopnea and mild dyspnea on exertion. He has noticed the edema has worsened over the past 48 hours.   HPI/Subjective: Pt is resting comfortably at present.  He denies CP, n/v, sob, or abdom pain.    Assessment/Plan:  Acute hypoxic respiratory failure - CAP + acute COPD exac + CHF exac  tx each individual issue and complete w/u  R pleural based loculated effusion  Pulm following  in consultation - TCTS has seen and plans an eventual VATS once all other issues stabilized (weeks away)  CAP - L pleural effusion  Cont empiric abx tx - now s/p thoracentesis per PCCM - clinically stabilizing    Newly diagnosed severe systolic CHF - EF 20-25% W/u underway - tachy mediated (new afib) v/s ischemic - for R and L heart cath today - Cards following - cont in attempts to diurese - wgt at admit 90.7kg - current weight 81.6 - net negative ~9L - LE edema beginning to improve   Probable COPD - Tobacco abuse  Cont usual tx regimen - no formal diagnosis previously as pt has not received prior medical care - no wheezing today - acute exacerbation resolved   Newly diagnosed Afib w RVR On heparin gtt - CCB changed to amio given low EF - Cards following - TSH normal - rate currently well controlled   Mild hypokalemia Due to diuresis - replaced to goal of 4.0   Code Status: FULL Family Communication: no family present at time of exam Disposition Plan: SDU   Consultants: [facility]  PCCM TCTS  Procedures: 2/4 TTE - EF 20-25% - diffuse hypokinesis - moderate RAE/RVE; mildly reduced RV function 2/4 LE venous dopplers - negative for DVT  2/8 - R and L heart cath - pending   Antibiotics: Azithro 2/4 > 2/5 Rocephin 2/4 > 2/5 Zosyn 2/4 > Vanc 2/4 >  DVT prophylaxis: IV heparin   Objective: Blood pressure 94/67, pulse 34, temperature 97.9 F (36.6 C), temperature source Oral, resp. rate 16, height 5\' 9"  (1.753 m), weight 81.6 kg (  179 lb 14.3 oz), SpO2 97 %.  Intake/Output Summary (Last 24 hours) at 08/17/14 1630 Last data filed at 08/17/14 1300  Gross per 24 hour  Intake 1700.82 ml  Output   3350 ml  Net -1649.18 ml    Exam: General: no acute resp distress - alert and oriented   Lungs: poor air movement B bases - no wheeze  Cardiovascular: irreg irreg - rate controlled - no appreciable M Abdomen: Nontender, nondistended, soft, bowel sounds positive, no rebound,  no ascites, no appreciable mass Extremities: No significant cyanosis, clubbing;  1+ edema bilateral lower extremities   Data Reviewed: Basic Metabolic Panel:  Recent Labs Lab 08/13/14 1255 08/13/14 2048 08/15/14 0256 08/16/14 0410 08/17/14 0405  NA 137  --  135 139 137  K 4.4  --  3.7 3.4* 4.0  CL 100  --  94* 92* 91*  CO2 31  --  33* 36* 37*  GLUCOSE 94  --  143* 127* 115*  BUN 13  --  CREATININE 0.84 0.92 0.91 1.01 1.06  CALCIUM 8.5  --  8.3* 8.2* 8.5  MG  --   --   --   --  2.2    Liver Function Tests:  Recent Labs Lab 08/13/14 1255 08/14/14 1906 08/15/14 0256  AST 38*  --  34  ALT 41  --  41  ALKPHOS 76  --  73  BILITOT 0.8  --  0.8  PROT 6.7 6.7 6.7  ALBUMIN 2.6*  --  2.3*   CBC:  Recent Labs Lab 08/13/14 1255 08/13/14 2048 08/14/14 0238 08/15/14 0256 08/16/14 0410 08/17/14 0405  WBC 10.9* 10.7* 9.8 22.5* 20.1* 16.7*  NEUTROABS 8.2*  --   --   --   --   --   HGB 14.1 14.3 14.6 14.2 14.6 15.1  HCT 44.5 45.1 46.0 44.2 46.0 48.3  MCV 98.5 97.8 97.9 96.5 97.9 100.4*  PLT 260 244 252 255 254 261   BNP (last 3 results)  Recent Labs  08/13/14 1755  BNP 1223.4*    Recent Results (from the past 240 hour(s))  Blood Culture (routine x 2)     Status: None (Preliminary result)   Collection Time: 08/13/14 12:55 PM  Result Value Ref Range Status   Specimen Description BLOOD FOREARM RIGHT  Final   Special Requests BOTTLES DRAWN AEROBIC AND ANAEROBIC 5CC  Final   Culture   Final           BLOOD CULTURE RECEIVED NO GROWTH TO DATE CULTURE WILL BE HELD FOR 5 DAYS BEFORE ISSUING A FINAL NEGATIVE REPORT Performed at [facility]    Report Status PENDING  Incomplete  Blood Culture (routine x 2)     Status: None (Preliminary result)   Collection Time: 08/13/14  1:00 PM  Result Value Ref Range Status   Specimen Description BLOOD LEFT ANTECUBITAL  Final   Special Requests BOTTLES DRAWN AEROBIC AND ANAEROBIC  Final   Culture   Final            BLOOD CULTURE RECEIVED NO GROWTH TO DATE CULTURE WILL BE HELD FOR 5 DAYS BEFORE ISSUING A FINAL NEGATIVE REPORT Performed at [facility]    Report Status PENDING  Incomplete  Urine culture     Status: None   Collection Time: 08/13/14  1:30 PM  Result Value Ref Range Status   Specimen Description URINE, CLEAN CATCH  Final   Special Requests NONE  Final   Colony Count NO GROWTH Performed at [facility]   Final   Culture NO GROWTH Performed at [facility]   Final   Report Status 08/14/2014 FINAL  Final  MRSA PCR Screening     Status: None   Collection Time: 08/13/14  7:18 PM  Result Value Ref Range Status   MRSA by PCR NEGATIVE NEGATIVE Final    Comment:        The GeneXpert MRSA Assay (FDA approved for NASAL specimens only), is one component of a comprehensive MRSA colonization surveillance program. It is not intended to diagnose MRSA infection nor to guide or monitor treatment for MRSA infections.   Culture, sputum-assessment     Status: None   Collection Time: 08/13/14 11:18 PM  Result Value Ref Range Status   Specimen Description SPUTUM  Final   Special Requests NONE  Final   Sputum evaluation   Final    MICROSCOPIC FINDINGS SUGGEST THAT THIS SPECIMEN IS NOT REPRESENTATIVE OF LOWER RESPIRATORY SECRETIONS. PLEASE RECOLLECT. RESULT CALLED TO, READ BACK BY AND VERIFIED WITH: A. WEATHERFORD RN [phone] GREEN R    Report Status 08/14/2014 FINAL  Final  AFB culture with smear     Status: None (Preliminary result)   Collection Time: 08/14/14  4:25 PM  Result Value Ref Range Status   Specimen Description PLEURAL FLUID LEFT  Final   Special Requests NONE  Final   Acid Fast Smear   Final    NO ACID FAST BACILLI SEEN Performed at [facility]    Culture   Final    CULTURE WILL BE EXAMINED FOR 6 WEEKS BEFORE ISSUING A FINAL REPORT Performed at [facility]    Report Status PENDING  Incomplete  Body fluid culture      Status: None (Preliminary result)   Collection Time: 08/14/14  4:26 PM  Result Value Ref Range Status   Specimen Description PLEURAL FLUID LEFT  Final   Special Requests NONE  Final   Gram Stain   Final    RARE WBC PRESENT,BOTH PMN AND MONONUCLEAR NO ORGANISMS SEEN Performed at [facility]    Culture   Final    NO GROWTH 2 DAYS Performed at [facility]    Report Status PENDING  Incomplete  Fungus Culture with Smear     Status: None (Preliminary result)   Collection Time: 08/14/14  4:26 PM  Result Value Ref Range Status   Specimen Description FLUID PLEURAL LEFT  Final   Special Requests NONE  Final   Fungal Smear   Final    NO YEAST OR FUNGAL ELEMENTS SEEN Performed at [facility]    Culture   Final    CULTURE IN PROGRESS FOR FOUR WEEKS Performed at [facility]    Report Status PENDING  Incomplete     Studies:  Recent x-ray studies have been reviewed in detail by the Attending Physician  Scheduled Meds:  Scheduled Meds: . aspirin  81 mg Oral Daily  . atorvastatin  40 mg Oral q1800  . bisoprolol  7.5 mg Oral Daily  . furosemide  40 mg Oral Q breakfast  . ipratropium  0.5 mg Nebulization BID  . levalbuterol  0.63 mg Nebulization BID  . lisinopril  5 mg Oral BID  . nicotine  21 mg Transdermal Daily  . piperacillin-tazobactam (ZOSYN)  IV  3.375 g Intravenous Q8H  . sodium chloride  3 mL Intravenous Q12H  . spironolactone  25 mg Oral Daily  . vancomycin  1,000 mg Intravenous Q8H    Time spent on care of this patient: 35 mins   Deep Bonawitz T , MD   [facility] Office  [phone] Pager - Text Page per Loretha StaplerAmion as per below:  On-Call/Text Page:      [email or web address]      password [email or web address]  If 7PM-7AM, please contact night-coverage [email or web address] Password [email or web address] 08/17/2014, 4:30 PM   LOS: 4 days

## 2014-08-17 NOTE — CV Procedure (Signed)
    Cardiac Catheterization Procedure Note  Name: Jason Spencer MRN: [id number] DOB: 02/16/1951  Procedure: Right Heart Cath, Left Heart Cath, Selective Coronary Angiography  Indication: CHF   Procedural Details: The right radial and brachial areas were prepped, draped, and anesthetized with 1% lidocaine. There was a pre-existing IV in the right brachial area.  This was replaced with a 5 F venous sheath. A Swan-Ganz catheter was used for the right heart catheterization. Standard protocol was followed for recording of right heart pressures and sampling of oxygen saturations. Fick cardiac output was calculated. The right radial artery was then entered using modified Seldinger technique and a 6 F sheath was placed.  Standard Judkins catheters were used for selective coronary angiography. There were no immediate procedural complications. The patient was transferred to the post catheterization recovery area for further monitoring.  Procedural Findings: Hemodynamics (mmHg) RA 11 RV 41/8 PA 43/20, mean 27 PCWP mean 16 LV 96/7 AO 104/64  Oxygen saturations: PA 57% AO 91%  Cardiac Output (Fick) 3.75  Cardiac Index (Fick) 1.9   Coronary angiography: Coronary dominance: right  Left mainstem: No significant disease.   Left anterior descending (LAD): Luminal irregularities.   Left circumflex (LCx): Large vessel, luminal irregularities.   Right coronary artery (RCA): 50% mid RCA stenosis followed by 95% distal RCA stenosis near bifurcation of PLV and PDA.   Left ventriculography: Not done, recent echo.   Final Conclusions:  Severe distal RCA stenosis.  This does not explain the extent of his cardiomyopathy.  Suspect mixed ischemic/nonischemic cardiomyopathy (possibly role of tachy-mediated cardiomyopathy).  Will plan PCI to distal RCA today.  Will use Plavix and BMS so that we can hold antiplatelet agent for VATS in the future.  He will start back on heparin gtt bridging to warfarin  after procedure for TEE-guided DCCV possibly Wednesday.   Filling pressures reasonably optimized.  Cardiac index is low.  Hopefully this will improve with cardioversion in the near future.   [name] 08/17/2014, 4:24 PM

## 2014-08-17 NOTE — Progress Notes (Signed)
ANTICOAGULATION CONSULT NOTE - Follow Up Consult  Pharmacy Consult for Heparin  Indication: atrial fibrillation  No Known Allergies  Patient Measurements: Height: 5\' 9"  (175.3 cm) Weight: 179 lb 14.3 oz (81.6 kg) IBW/kg (Calculated) : 70.7  Vital Signs: Temp: 97.9 F (36.6 C) (02/08 0744) Temp Source: Oral (02/08 0744) BP: 132/84 mmHg (02/08 0744) Pulse Rate: 34 (02/08 0000)  Labs:  Recent Labs  08/15/14 0256  08/16/14 0410 08/16/14 1228 08/16/14 2042 08/17/14 0405  HGB 14.2  --  14.6  --   --  15.1  HCT 44.2  --  46.0  --   --  48.3  PLT 255  --  254  --   --  261  HEPARINUNFRC 0.47  < > 0.90* 0.72* 0.66 0.56  CREATININE 0.91  --  1.01  --   --  1.06  < > = values in this interval not displayed.  Estimated Creatinine Clearance: 73.2 mL/min (by C-G formula based on Cr of 1.06).  . sodium chloride 10 mL/hr at 08/17/14 1000  . amiodarone 30 mg/hr (08/17/14 1000)  . heparin 1,400 Units/hr (08/17/14 1000)     Assessment: 64yo M presented on 2/4 with SOB, wheezing, and cough. Found to be in Afib with RVR at admission.  Heparin level continues to be therapeutic on 1400 units/hr. No bleeding or complications noted.  Noted plans for heart cath today and possibly switch to DOAC post-cath  Goal of Therapy:  Heparin level 0.3-0.7 units/ml Monitor platelets by anticoagulation protocol: Yes   Plan:  -Continue IV heparin 1400 units/hr. -Daily CBC/HL -Monitor for bleeding -Follow up after cath  Sheppard CoilFrank Keriann Rankin PharmD., BCPS Clinical Pharmacist Pager [phone] 08/17/2014 11:54 AM

## 2014-08-17 NOTE — Progress Notes (Addendum)
Site area:right brachial a 5 french arterial sheath was removed  Site Prior to Removal:  Level 0  Pressure Applied For 10 MINUTES    Minutes Beginning at 1610p Manual:   Yes.    Patient Status During Pull:  stable  Post Pull Groin Site:  Level 0  Post Pull Instructions Given:  Yes.    Post Pull Pulses Present:  Yes.    Dressing Applied:  Yes.    Comments:  VS remain stable during sheath pull.  Pt denies any discomfort at site..Marland Kitchen

## 2014-08-17 NOTE — Interval H&P Note (Signed)
Cath Lab Visit (complete for each Cath Lab visit)  Clinical Evaluation Leading to the Procedure:   ACS: No.  Non-ACS:    Anginal Classification: CCS IV  Anti-ischemic medical therapy: Minimal Therapy (1 class of medications)  Non-Invasive Test Results: No non-invasive testing performed  Prior CABG: No previous CABG      History and Physical Interval Note:  08/17/2014 3:33 PM  Jason Spencer  has presented today for surgery, with the diagnosis of shortness of breath  The various methods of treatment have been discussed with the patient and family. After consideration of risks, benefits and other options for treatment, the patient has consented to  Procedure(s): LEFT AND RIGHT HEART CATHETERIZATION WITH CORONARY ANGIOGRAM (N/A) as a surgical intervention .  The patient's history has been reviewed, patient examined, no change in status, stable for surgery.  I have reviewed the patient's chart and labs.  Questions were answered to the patient's satisfaction.     Stacy Sailer Chesape[employer] EnergyMcLean

## 2014-08-18 DIAGNOSIS — E876 Hypokalemia: Secondary | ICD-10-CM | POA: Diagnosis present

## 2014-08-18 DIAGNOSIS — R000 Tachycardia, unspecified: Secondary | ICD-10-CM

## 2014-08-18 LAB — CBC
HCT: 46.2 % (ref 39.0–52.0)
Hemoglobin: 14.6 g/dL (ref 13.0–17.0)
MCH: 30.9 pg (ref 26.0–34.0)
MCHC: 31.6 g/dL (ref 30.0–36.0)
MCV: 97.7 fL (ref 78.0–100.0)
PLATELETS: 215 10*3/uL (ref 150–400)
RBC: 4.73 MIL/uL (ref 4.22–5.81)
RDW: 13 % (ref 11.5–15.5)
WBC: 10.9 10*3/uL — AB (ref 4.0–10.5)

## 2014-08-18 LAB — BODY FLUID CULTURE: Culture: NO GROWTH

## 2014-08-18 LAB — BASIC METABOLIC PANEL
Anion gap: 14 (ref 5–15)
BUN: 18 mg/dL (ref 6–23)
CALCIUM: 8.3 mg/dL — AB (ref 8.4–10.5)
CO2: 27 mmol/L (ref 19–32)
Chloride: 94 mmol/L — ABNORMAL LOW (ref 96–112)
Creatinine, Ser: 1.07 mg/dL (ref 0.50–1.35)
GFR, EST AFRICAN AMERICAN: 85 mL/min — AB (ref >90)
GFR, EST NON AFRICAN AMERICAN: 73 mL/min — AB (ref >90)
Glucose, Bld: 104 mg/dL — ABNORMAL HIGH (ref 70–99)
POTASSIUM: 3.3 mmol/L — AB (ref 3.5–5.1)
Sodium: 135 mmol/L (ref 135–145)

## 2014-08-18 LAB — HEPARIN LEVEL (UNFRACTIONATED): HEPARIN UNFRACTIONATED: 0.4 [IU]/mL (ref 0.30–0.70)

## 2014-08-18 LAB — PROTIME-INR
INR: 1.11 (ref 0.00–1.49)
Prothrombin Time: 14.5 seconds (ref 11.6–15.2)

## 2014-08-18 LAB — POTASSIUM: Potassium: 4 mmol/L (ref 3.5–5.1)

## 2014-08-18 LAB — MAGNESIUM: Magnesium: 2 mg/dL (ref 1.5–2.5)

## 2014-08-18 MED ORDER — METHYLPREDNISOLONE SODIUM SUCC 125 MG IJ SOLR
60.0000 mg | INTRAMUSCULAR | Status: DC
  Administered 2014-08-18 – 2014-08-19 (×2): 60 mg via INTRAVENOUS
  Filled 2014-08-18 (×3): qty 0.96

## 2014-08-18 MED ORDER — SODIUM CHLORIDE 0.9 % IJ SOLN: 3.0000 mL | INTRAMUSCULAR | Status: DC | PRN

## 2014-08-18 MED ORDER — PATIENT'S GUIDE TO USING COUMADIN BOOK
Freq: Once | Status: AC
  Administered 2014-08-18: 18:00:00
  Filled 2014-08-18: qty 1

## 2014-08-18 MED ORDER — SODIUM CHLORIDE 0.9 % IV SOLN: INTRAVENOUS | Status: DC

## 2014-08-18 MED ORDER — WARFARIN SODIUM 5 MG PO TABS
5.0000 mg | ORAL_TABLET | Freq: Once | ORAL | Status: AC
  Administered 2014-08-18: 5 mg via ORAL
  Filled 2014-08-18 (×2): qty 1

## 2014-08-18 MED ORDER — POTASSIUM CHLORIDE CRYS ER 20 MEQ PO TBCR
40.0000 meq | EXTENDED_RELEASE_TABLET | Freq: Once | ORAL | Status: AC
  Administered 2014-08-18: 40 meq via ORAL
  Filled 2014-08-18: qty 2

## 2014-08-18 MED ORDER — SODIUM CHLORIDE 0.9 % IJ SOLN
3.0000 mL | Freq: Two times a day (BID) | INTRAMUSCULAR | Status: DC
  Administered 2014-08-18 – 2014-08-20 (×5): 3 mL via INTRAVENOUS

## 2014-08-18 MED ORDER — LISINOPRIL 5 MG PO TABS
7.5000 mg | ORAL_TABLET | Freq: Two times a day (BID) | ORAL | Status: DC
  Administered 2014-08-18 – 2014-08-19 (×4): 7.5 mg via ORAL
  Filled 2014-08-18 (×7): qty 1

## 2014-08-18 MED ORDER — WARFARIN VIDEO
Freq: Once | Status: AC
  Administered 2014-08-18: 18:00:00

## 2014-08-18 MED ORDER — FUROSEMIDE 40 MG PO TABS
40.0000 mg | ORAL_TABLET | Freq: Two times a day (BID) | ORAL | Status: DC
  Administered 2014-08-18 (×2): 40 mg via ORAL
  Filled 2014-08-18 (×5): qty 1

## 2014-08-18 MED ORDER — SODIUM CHLORIDE 0.9 % IV SOLN: 250.0000 mL | INTRAVENOUS | Status: DC

## 2014-08-18 MED FILL — Sodium Chloride IV Soln 0.9%: INTRAVENOUS | Qty: 50 | Status: AC

## 2014-08-18 NOTE — Progress Notes (Signed)
Patient ID: Jason Spencer, male   DOB: 1951-05-08, 64 y.o.   MRN: [id number]     SUBJECTIVE: Remains in atrial fibrillation this morning.  Denies chest pain, says breathing continues to improve.  He had RHC/LHC yesterday followed by PCI to RCA.     Has had left-sided thoracentesis, suspect parapneumonic effusion.  Has loculated right-sided effusion, evaluated by Dr Dorris Fetch, plan for VATS after antibiotic treatment of CAP in a few weeks.   Echo with EF 20-25%, moderately dilated RV with mildly decreased systolic function.   RHC/LHC (2/8) RA 11 RV 41/8 PA 43/20, mean 27 PCWP mean 16 LV 96/7 AO 104/64 Oxygen saturations: PA 57% AO 91% Cardiac Output (Fick) 3.75  Cardiac Index (Fick) 1.9 95% mid-distal RCA, s/p BMS to mid-distal RCA  Scheduled Meds: . aspirin  81 mg Oral Daily  . atorvastatin  40 mg Oral q1800  . bisoprolol  7.5 mg Oral Daily  . clopidogrel  75 mg Oral Q breakfast  . furosemide  40 mg Oral BID  . lisinopril  7.5 mg Oral BID  . nicotine  21 mg Transdermal Daily  . piperacillin-tazobactam (ZOSYN)  IV  3.375 g Intravenous Q8H  . potassium chloride  40 mEq Oral Once  . sodium chloride  3 mL Intravenous Q12H  . spironolactone  25 mg Oral Daily  . vancomycin  1,000 mg Intravenous Q8H  . Warfarin - Pharmacist Dosing Inpatient   Does not apply q1800   Continuous Infusions: . sodium chloride 10 mL/hr at 08/17/14 2000  . sodium chloride    . amiodarone 30 mg/hr (08/18/14 0535)  . heparin 1,400 Units/hr (08/18/14 0137)   PRN Meds:.acetaminophen, levalbuterol, ondansetron (ZOFRAN) IV, oxyCODONE-acetaminophen, sodium chloride    Filed Vitals:   08/18/14 0100 08/18/14 0200 08/18/14 0300 08/18/14 0500  BP: 137/68 134/74 102/58   Pulse:   80   Temp:    98 F (36.7 C)  TempSrc:      Resp: Height:      Weight:    183 lb 8 oz (83.235 kg)  SpO2:   93%     Intake/Output Summary (Last 24 hours) at 08/18/14 0735 Last data filed at 08/18/14  0600  Gross per 24 hour  Intake 1451.8 ml  Output   1575 ml  Net -123.2 ml    LABS: Basic Metabolic Panel:  Recent Labs  78/29/56 0405 08/18/14 0253  NA 137 135  K 4.0 3.3*  CL 91* 94*  CO2 37* 27  GLUCOSE 115* 104*  BUN 16 18  CREATININE 1.06 1.07  CALCIUM 8.5 8.3*  MG 2.2  --    Liver Function Tests: No results for input(s): AST, ALT, ALKPHOS, BILITOT, PROT, ALBUMIN in the last 72 hours. No results for input(s): LIPASE, AMYLASE in the last 72 hours. CBC:  Recent Labs  08/17/14 0405 08/18/14 0253  WBC 16.7* 10.9*  HGB 15.1 14.6  HCT 48.3 46.2  MCV 100.4* 97.7  PLT 261 215   Cardiac Enzymes: No results for input(s): CKTOTAL, CKMB, CKMBINDEX, TROPONINI in the last 72 hours. BNP: Invalid input(s): POCBNP D-Dimer: No results for input(s): DDIMER in the last 72 hours. Hemoglobin A1C: No results for input(s): HGBA1C in the last 72 hours. Fasting Lipid Panel: No results for input(s): CHOL, HDL, LDLCALC, TRIG, CHOLHDL, LDLDIRECT in the last 72 hours. Thyroid Function Tests: No results for input(s): TSH, T4TOTAL, T3FREE, THYROIDAB in the last 72 hours.  Invalid input(s): FREET3 Anemia Panel: No results  for input(s): VITAMINB12, FOLATE, FERRITIN, TIBC, IRON, RETICCTPCT in the last 72 hours.  RADIOLOGY: Ct Chest W Contrast  08/13/2014   CLINICAL DATA:  Diagnosed with pneumonia 6 days ago, shortness of breath, cough, weakness, smoker  EXAM: CT CHEST WITH CONTRAST  TECHNIQUE: Multidetector CT imaging of the chest was performed during intravenous contrast administration. Sagittal and coronal MPR images reconstructed from axial data set.  CONTRAST:  100mL OMNIPAQUE IOHEXOL 300 MG/ML  SOLN IV  COMPARISON:  Chest radiograph 08/13/2014  FINDINGS: Scattered atherosclerotic calcifications aorta and coronary arteries.  Aortic normal caliber.  Pulmonary arteries well opacified and grossly patent on nondedicated exam.  Scattered normal size thoracic lymph nodes.  Cardiac chambers  appear enlarged.  Pleural calcifications posterior medial RIGHT hemi thorax.  Visualized upper abdomen unremarkable.  Loculated RIGHT pleural effusion.  Dependent LEFT pleural effusion.  Minimal scattered atelectasis in RIGHT lung with more significant compressive atelectasis in LEFT lower lobe.  Infiltrates identified in LEFT upper and LEFT lower lobes, particularly peripherally in the LEFT upper lobe where focal density is seen which could represent rounded atelectasis or pneumonia.  Emphysematous changes at apices and scattered peribronchial thickening.  No pneumothorax or acute osseous findings.  IMPRESSION: Emphysematous and bronchitic changes question COPD.  BILATERAL pleural effusions, loculated on RIGHT, with scattered infiltrates in LEFT upper and LEFT lower lobes.  Followup until resolution recommended to exclude underlying abnormalities particularly in the periphery of the LEFT upper lobe.  Nonspecific pleural calcifications in the RIGHT hemi thorax, could be related to prior hemothorax, empyema, less likely TB or asbestos exposure due to unilateral involvement.   Electronically Signed   By: Ulyses SouthwardMark  Boles M.D.   On: 08/13/2014 17:30   Dg Chest Port 1 View  08/16/2014   CLINICAL DATA:  Pneumonia  EXAM: PORTABLE CHEST - 1 VIEW  COMPARISON:  08/14/2014  FINDINGS: Mild patchy left upper lobe opacity, unchanged.  Stable pleural based mass in the lateral right hemithorax, corresponding to loculated pleural effusion on CT.  No pneumothorax.  Cardiomegaly.  IMPRESSION: Mild patchy left upper lobe opacity, suspicious for pneumonia, unchanged.  Stable pleural-based mass in the lateral right hemithorax, corresponding to loculated pleural effusion on CT.   Electronically Signed   By: Charline BillsSriyesh  Krishnan M.D.   On: 08/16/2014 09:05   Dg Chest Port 1 View  08/14/2014   CLINICAL DATA:  Shortness of breath, status post thoracentesis  EXAM: PORTABLE CHEST - 1 VIEW  COMPARISON:  CT chest dated 08/13/2014  FINDINGS:  Pleural-based opacity along the lateral right hemithorax, corresponding to a loculated pleural effusion on CT.  Patchy opacity in the left upper lobe, suspicious for pneumonia, although improving when compared to prior chest radiographs.  No pneumothorax status post thoracentesis.  Cardiomegaly.  IMPRESSION: No pneumothorax status post thoracentesis.  Otherwise unchanged.   Electronically Signed   By: Charline BillsSriyesh  Krishnan M.D.   On: 08/14/2014 19:15   Dg Chest Port 1 View  08/13/2014   CLINICAL DATA:  Atrial fibrillation.  EXAM: PORTABLE CHEST - 1 VIEW  COMPARISON:  August 07, 2014.  FINDINGS: Stable cardiomediastinal silhouette. No pneumothorax is noted. Stable pleural opacity is seen along right lateral chest wall most consistent with loculated effusion, but mass cannot be excluded. Fluid is also noted in the right minor fissure. Increased left perihilar opacity is noted concerning for pneumonia or possibly edema. Bony thorax is intact.  IMPRESSION: Increased left perihilar opacity is noted concerning for pneumonia or possibly edema.  Stable pleural-based opacity is noted  along right lateral chest wall most consistent with loculated pleural effusion, with probable loculated fluid within the right minor fissure is well. However, CT scan of the chest is recommended to rule out pleural-based mass.   Electronically Signed   By: Roque Lias M.D.   On: 08/13/2014 14:00    PHYSICAL EXAM General: NAD Neck: JVP 8-9 cm cm, no thyromegaly or thyroid nodule.  Lungs: Crackles left base, decreased breath sounds right base.  CV: Lateral PMI.  Heart irregular S1/S2, no S3/S4, no murmur.  1+ edema ankles.   Abdomen: Soft, nontender, no hepatosplenomegaly, no distention.  Neurologic: Alert and oriented x 3.  Psych: Normal affect. Extremities: No clubbing or cyanosis.   TELEMETRY: Reviewed telemetry pt in atrial fibrillation 80s-90s  ASSESSMENT AND PLAN: 64 yo with history of smoking presented with dyspnea and was  found to be in atrial fibrillation with RVR and PNA + effusions on CXR.  He was clinically in CHF as well.  1. Atrial fibrillation: With RVR at admission.  Not sure how long he has been in atrial fibrillation. 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  I think that we need to get him out of atrial fibrillation.  Given low EF, I think that our best chance long-term is likely with an antiarrhythmic perhaps considering atrial fibrillation ablation as well. - He is on amiodarone gtt. Amiodarone will increase our chances of getting/keeping him in NSR.  However, given his lung problems, this may not be a good long-term option.   - Will use bisoprolol as well (beta-1 selective given concern for COPD).  - He had BMS placed yesterday.  Will use warfarin rather than NOAC.  He is on heparin gtt now, overlapping to therapeutic INR.  Will plan for TEE-guided DCCV tomorrow, possibly home on Lovenox bridge.  2. Acute systolic CHF: EF 95-28% with mildly decreased RV systolic function.  Volume status is improving on exam, much better compared to a couple of days ago.  Still some volume overload on exam.  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  Has CAD but does not explain the extent of his LV dysfunction. -  Continue bisoprolol and spironolactone.  -  Increase Lasix to 40 mg po bid with mild volume overload. -  Increase lisinopril to 7.5 mg bid.  3. ID: Suspect CAP with parapneumonic effusion on left.  Currently on vanco/Zosyn.  He also has a right loculated effusion.  Plan per Dr Dorris Fetch to treat with abx then in a few weeks (would have to be at least 4 wks post-DCCV), plan VATS on right.  He is afebrile.  WBCs high but coming down after stopping Solumedrol.  4. Smoking: Needs to quit.  Possible COPD. Steroids stopped. 5. CAD: BMS to RCA yesterday. Will need ASA 81 + Plavix + warfarin x 1 month, then stop ASA.  Will continue Plavix + warfarin combination to  finish a year.  However, if necessary to stop for VATS after 1 month, this would option given use of BMS.  Continue statin.  6. Can go to telemetry.   Marca Ancona 08/18/2014 7:35 AM

## 2014-08-18 NOTE — Progress Notes (Signed)
CARDIAC REHAB PHASE I   PRE:  Rate/Rhythm: 84 Aflutter  BP:  Supine:   Sitting: 109/74  Standing:    SaO2: 92 RA  MODE:  Ambulation: 350 ft   POST:  Rate/Rhythm: 88 Aflutter  BP:  Supine:   Sitting: 123/78  Standing:    SaO2: 91 RA 1340-1420  Assisted X 1 to ambulate. Gait steady. Pt c/o of feeling weak from being in bed. He was able to walk 350 feet without c/o of pain or SOB. Vs stable Pt to recliner after walk with call light in reach.  Melina CopaLisa Kule Gascoigne RN 08/18/2014 2:16 PM

## 2014-08-18 NOTE — Progress Notes (Signed)
Name: Jason StallingJarrett Sachse MRN: [id number] DOB: [birth year]    ADMISSION DATE:  08/13/2014 CONSULTATION DATE:  08/18/2014  REFERRING MD :  Thedore MinsSingh  CHIEF COMPLAINT:  CAP, stable pleural opacity  BRIEF PATIENT DESCRIPTION:  64 yo male smoker tx for CAP as outpt, presented with A fib with RVR.  Found to have Rt pleural effusion.  SIGNIFICANT EVENTS  2/04 Admit, cardiology consulted 2/05 TCTS consult for loculated right effusion  STUDIES:  2/04 CT chest >> loculated Rt pleural effusion, dependent Lt pleural effusion, pleural calcification Rt medial hemithorax, LUL and LLL ASD, apical changes of emphysema 2/05 Echo >> mod LVH, EF 20 to 25% 2/05 Doppler b/l legs >> no DVT 2/05 Lt thoracentesis >> protein < 3, LDH 67, WBC 1599 (25 N, 52 L, 22 M), cytology negative 2/08 Rt/Lt heart cath >> severe distal RCA stenosis s/p angioplasty  SUBJECTIVE:  Breathing better.  Cough up sputum still, but coming up easily.  Denies chest pain.  VITAL SIGNS: Temp:  [97.7 F (36.5 C)-99.1 F (37.3 C)] 99.1 F (37.3 C) (02/09 0819) Pulse Rate:  [44-86] 86 (02/09 0819) Resp:  [11-23] 22 (02/09 0819) BP: (94-146)/(57-109) 139/66 mmHg (02/09 0819) SpO2:  [86 %-99 %] 93 % (02/09 0819) Weight:  [183 lb 8 oz (83.235 kg)] 183 lb 8 oz (83.235 kg) (02/09 0500)  PHYSICAL EXAMINATION: General: no distress HEENT: poor dentition PULM: faint crackles Rt base, no wheeze CV: irregular AB: BS+, soft Ext: no edema Neuro: normal strength    CBC Recent Labs     08/16/14  0410  08/17/14  0405  08/18/14  0253  WBC  20.1*  16.7*  10.9*  HGB  14.6  15.1  14.6  HCT  46.0  48.3  46.2  PLT  254  261  215    Coag's Recent Labs     08/17/14  1310  08/18/14  0253  INR  1.07  1.11    BMET Recent Labs     08/16/14  0410  08/17/14  0405  08/18/14  0253  NA  139  137  135  K  3.4*  4.0  3.3*  CL  92*  91*  94*  CO2  36*  37*  27  BUN  16  16  18   CREATININE  1.01  1.06  1.07  GLUCOSE  127*  115*  104*     Electrolytes Recent Labs     08/16/14  0410  08/17/14  0405  08/18/14  0253  CALCIUM  8.2*  8.5  8.3*  MG   --   2.2   --     Sepsis Markers Recent Labs     08/17/14  0405  PROCALCITON  <0.10    ABG Recent Labs     08/17/14  1602  PHART  7.460*  PCO2ART  47.4*  PO2ART  86.0    Imaging No results found.      ASSESSMENT / PLAN:  Acute hypoxic respiratory failure 2nd to PNA, emphysema, b/l pleural effusions, systolic CHF. Plan: - oxygen to keep SpO2 > 92%  Lt transudate pleural effusion in setting of PNA. Plan: - Day 6 of Abx, currently on zosyn/vancomycin  Blood cx 2/04 >> Lt pleural fluid 2/05 >> Lt pleural fluid AFB 2/05 >> Lt pleural fluid fungal cx 2/05 >>  Rt loculated pleural effusion. Plan: - f/u CXR - TCTS to decide if he needs VATS  A fib with RVR, acute systolic CHF, CAD. Plan: - per  primary team and cardiology  Tobacco abuse with changes of emphysema on CT chest.. Plan: - smoking cessation - nicotine patch - prn xopenex - will need PFTs when more stable   PCCM can be available as needed.  Will sign off.  Please call if further help needed.  Coralyn Helling, MD [facility] Pulmonary/Critical Care 08/18/2014, 9:33 AM Pager:  [phone] After 3pm call: [phone]

## 2014-08-18 NOTE — Progress Notes (Signed)
Patient arrived to the unit. Patient is A/O, VSS, A. Fib on the monitor, pain 0/10. POC discussed with the patient, patient verbalized understanding. Patient has no concerns, currently sitting on the side of the bed eating dinner. afleming RN

## 2014-08-18 NOTE — Progress Notes (Signed)
ANTICOAGULATION CONSULT NOTE   Pharmacy Consult for Heparin/coumadin Indication: atrial fibrillation  No Known Allergies  Patient Measurements: Height: 5\' 9"  (175.3 cm) Weight: 183 lb 8 oz (83.235 kg) IBW/kg (Calculated) : 70.7   Vital Signs: Temp: 99.1 F (37.3 C) (02/09 0819) Temp Source: Oral (02/09 0819) BP: 139/66 mmHg (02/09 0819) Pulse Rate: 86 (02/09 0819)  Labs:  Recent Labs  08/16/14 0410  08/16/14 2042 08/17/14 0405 08/17/14 1310 08/18/14 0253 08/18/14 1042  HGB 14.6  --   --  15.1  --  14.6  --   HCT 46.0  --   --  48.3  --  46.2  --   PLT 254  --   --  261  --  215  --   LABPROT  --   --   --   --  14.0 14.5  --   INR  --   --   --   --  1.07 1.11  --   HEPARINUNFRC 0.90*  < > 0.66 0.56  --   --  0.40  CREATININE 1.01  --   --  1.06  --  1.07  --   < > = values in this interval not displayed.  Estimated Creatinine Clearance: 72.5 mL/min (by C-G formula based on Cr of 1.07).   Medical History: Past Medical History  Diagnosis Date  . Atrial fibrillation with RVR 08/13/2014    new onset/notes 08/13/2014  . CAP (community acquired pneumonia) 07/2014  . Acute CHF     [name]/notes 08/13/2014  . Migraine 1970     "don't know why; they just left and haven't come back"   Assessment: 64 yo male with afib continues on heparin>warfarin bridge. Heparin continues to be at goal (0.4), INR still low at 1.1 after dose last night.  CBC stable, no bleeding issues noted. Plan to continue asa/plavix/warfarin x 1 then plavix/warfarin x 1 year.  CHADS2VASC- 1 (CHF) Goal of Therapy:  Heparin level 0.3-0.7 INR 2-3 Monitor platelets by anticoagulation protocol: Yes   Plan:  - Heparin at 1400 units/hr - coumadin 5 mg po x1 - INR in am  Sheppard CoilFrank Kazaria Gaertner PharmD., BCPS Clinical Pharmacist Pager [phone] 08/18/2014 2:34 PM

## 2014-08-18 NOTE — Progress Notes (Signed)
1 Day Post-Op Procedure(s) (LRB): LEFT AND RIGHT HEART CATHETERIZATION WITH CORONARY ANGIOGRAM (N/A) PERCUTANEOUS CORONARY STENT INTERVENTION (PCI-S) Subjective: Feels "alright" this AM Still has some cough  Objective: Vital signs in last 24 hours: Temp:  [97.7 F (36.5 C)-99.1 F (37.3 C)] 99.1 F (37.3 C) (02/09 0819) Pulse Rate:  [44-86] 86 (02/09 0819) Cardiac Rhythm:  [-] Atrial fibrillation (02/09 0300) Resp:  [11-23] 22 (02/09 0819) BP: (94-146)/(57-109) 139/66 mmHg (02/09 0819) SpO2:  [86 %-99 %] 93 % (02/09 0819) Weight:  [183 lb 8 oz (83.235 kg)] 183 lb 8 oz (83.235 kg) (02/09 0500)  Hemodynamic parameters for last 24 hours:    Intake/Output from previous day: 02/08 0701 - 02/09 0700 In: 1451.8 [I.V.:1201.8; IV Piggyback:250] Out: 1575 [Urine:1575] Intake/Output this shift: Total I/O In: 502.1 [P.O.:360; I.V.:142.1] Out: 400 [Urine:400]  General appearance: alert, cooperative and no distress Heart: irregularly irregular rhythm Lungs: diminished breath sounds right mid lung  Lab Results:  Recent Labs  08/17/14 0405 08/18/14 0253  WBC 16.7* 10.9*  HGB 15.1 14.6  HCT 48.3 46.2  PLT 261 215   BMET:  Recent Labs  08/17/14 0405 08/18/14 0253  NA 137 135  K 4.0 3.3*  CL 91* 94*  CO2 37* 27  GLUCOSE 115* 104*  BUN 16 18  CREATININE 1.06 1.07  CALCIUM 8.5 8.3*    PT/INR:  Recent Labs  08/18/14 0253  LABPROT 14.5  INR 1.11   ABG    Component Value Date/Time   PHART 7.460* 08/17/2014 1602   HCO3 33.7* 08/17/2014 1602   TCO2 35 08/17/2014 1602   O2SAT 97.0 08/17/2014 1602   CBG (last 3)  No results for input(s): GLUCAP in the last 72 hours.  Assessment/Plan: S/P Procedure(s) (LRB): LEFT AND RIGHT HEART CATHETERIZATION WITH CORONARY ANGIOGRAM (N/A) PERCUTANEOUS CORONARY STENT INTERVENTION (PCI-S) -  Cath yesterday showed severe single vessel disease- treated with stent to RCA  Low EF out of proportion to CAD so there is a non-ischemic  component as well.  Afebrile and WBC coming down now that he is off steroids  Will recheck CXR tomorrow   LOS: 5 days    Jason Spencer C 08/18/2014

## 2014-08-18 NOTE — Progress Notes (Signed)
TR band taken off of pt at 0115. Stayed on for an extended period due to oozing at the site. Heparin gtt was also supposed to start at 0115. Called Pharmacy to see if I needed to wait to start gtt due to just now taking off band. Was told that it should be ok to start but call pharmacy if oozing re-occurs. Will monitor closely.

## 2014-08-18 NOTE — Progress Notes (Signed)
[place] TEAM 1 - Stepdown/ICU TEAM Progress Note  Jason Spencer ZOX:[id number] DOB: 1950-08-26 DOA: 08/13/2014 PCP: Rudi Heap, MD  Admit HPI / Brief Narrative: 64 y.o. WM PMHx tobacco abuse stating it has been "years" since he has been to see a physician was sent to the [facility] emergency department from [facility]. Patient recently was treated for community-acquired pneumonia with Levaquin. Was instructed at that time to establish with a primary care physician. Upon presentation to the PCP office he was found to have a rapid irregular heart rate and was sent directly to [facility] ER via EMS.   Upon arrival to the emergency department patient was found to be in atrial fibrillation with RVR and was bolused and subsequently started on a Cardizem infusion. Heart rate has decreased from 137 to 108 but he remained in atrial fibrillation. Chest x-ray revealed increased left perihilar opacity concerning for pneumonia or possibly edema. There was also a stable pleural-based opacity in the right lateral chest wall most consistent with loculated pleural effusion. Patient had mild leukocytosis and was given empiric antibiotics to cover for pneumonia. Patient stated he has had upper respiratory symptoms for 1-1/2 months. He initially took some leftover Zithromax from a friend. He was seen at the urgent care on the 29th and was started on antibiotics with some improvement in his symptoms. For several weeks he noticed increasing lower extremity edema, orthopnea and mild dyspnea on exertion. He has noticed the edema has worsened over the past 48 hours.    HPI/Subjective: 2/9 A/O 4, NAD, positive productive cough (yellow), negative SOB, negative CP  Assessment/Plan: Acute hypoxic respiratory failure - CAP + acute COPD exac + CHF exac  -Continue Zosyn and vancomycin  -Xopenex nebulizer QID -Signed Medrol 60 mg daily -Flutter valve  R pleural based loculated effusion  -Pulm  following in consultation - TCTS has seen and plans an eventual VATS once all other issues stabilized (weeks away)  CAP - L pleural effusion  Cont empiric abx tx - now s/p thoracentesis per PCCM - clinically stabilizing   Newly diagnosed severe systolic CHF - EF 20-25% -Cardiac catheterization see results below  -Daily weight; wgt at admit 90.7kg       - current weight 81.1 kilograms  -Strict in and out; -10.8 liters   Probable COPD - Tobacco abuse  -Cont usual tx regimen - no formal diagnosis previously as pt has not received prior medical care - no wheezing today - acute exacerbation resolved   Newly diagnosed Afib w RVR -On heparin gtt - CCB changed to amio given low EF - Cards following - TSH normal - rate currently well controlled   Mild hypokalemia -Potassium goal >4    Code Status: FULL Family Communication: no family present at time of exam Disposition Plan: Per cardiothoracic surgery/cardiology    Consultants: [facility]  PCCM TCTS  Procedure/Significant Events: 2/4 TTE - EF 20-25% - diffuse hypokinesis - moderate RAE/RVE; mildly reduced RV function 2/4 LE venous dopplers - negative for DVT  2/8 - R and L heart cath - Successful angioplasty of the distal RCA reducing the 95% stenosis to 0%  Culture   Antibiotics: Azithro 2/4 > 2/5 Rocephin 2/4 > 2/5 Zosyn 2/4 > Vanc 2/4 >  DVT prophylaxis: IV heparin    Devices    LINES / TUBES:      Continuous Infusions: . sodium chloride 10 mL/hr at 08/18/14 1300  . sodium chloride    . amiodarone 30  mg/hr (08/18/14 1300)  . heparin 1,400 Units/hr (08/18/14 1300)    Objective: VITAL SIGNS: Temp: 97.7 F (36.5 C) (02/09 1200) Temp Source: Oral (02/09 1200) BP: 109/74 mmHg (02/09 1214) Pulse Rate: 86 (02/09 0819) SPO2; FIO2:   Intake/Output Summary (Last 24 hours) at 08/18/14 1344 Last data filed at 08/18/14 1300  Gross per 24 hour  Intake 1711.8 ml  Output   2600 ml  Net -888.2 ml      Exam: General:  A/O 4, NAD,No acute respiratory distress Lungs: left lung Clear to auscultation, RUL clear to auscultation, RML/RLL decreased breath sounds,without wheezes or crackles Cardiovascular: Regular rate and rhythm without murmur gallop or rub normal S1 and S2 Abdomen: Nontender, nondistended, soft, bowel sounds positive, no rebound, no ascites, no appreciable mass Extremities: No significant cyanosis, clubbing, or edema bilateral lower extremities  Data Reviewed: Basic Metabolic Panel:  Recent Labs Lab 08/13/14 1255 08/13/14 2048 08/15/14 0256 08/16/14 0410 08/17/14 0405 08/18/14 0253  NA 137  --  135 139 137 135  K 4.4  --  3.7 3.4* 4.0 3.3*  CL 100  --  94* 92* 91* 94*  CO2 31  --  33* 36* 37* 27  GLUCOSE 94  --  143* 127* 115* 104*  BUN 13  --  CREATININE 0.84 0.92 0.91 1.01 1.06 1.07  CALCIUM 8.5  --  8.3* 8.2* 8.5 8.3*  MG  --   --   --   --  2.2  --    Liver Function Tests:  Recent Labs Lab 08/13/14 1255 08/14/14 1906 08/15/14 0256  AST 38*  --  34  ALT 41  --  41  ALKPHOS 76  --  73  BILITOT 0.8  --  0.8  PROT 6.7 6.7 6.7  ALBUMIN 2.6*  --  2.3*   No results for input(s): LIPASE, AMYLASE in the last 168 hours. No results for input(s): AMMONIA in the last 168 hours. CBC:  Recent Labs Lab 08/13/14 1255  08/14/14 0238 08/15/14 0256 08/16/14 0410 08/17/14 0405 08/18/14 0253  WBC 10.9*  < > 9.8 22.5* 20.1* 16.7* 10.9*  NEUTROABS 8.2*  --   --   --   --   --   --   HGB 14.1  < > 14.6 14.2 14.6 15.1 14.6  HCT 44.5  < > 46.0 44.2 46.0 48.3 46.2  MCV 98.5  < > 97.9 96.5 97.9 100.4* 97.7  PLT 260  < > 252 255 254 261 215  < > = values in this interval not displayed. Cardiac Enzymes: No results for input(s): CKTOTAL, CKMB, CKMBINDEX, TROPONINI in the last 168 hours. BNP (last 3 results)  Recent Labs  08/13/14 1755  BNP 1223.4*    ProBNP (last 3 results) No results for input(s): PROBNP in the last 8760  hours.  CBG: No results for input(s): GLUCAP in the last 168 hours.  Recent Results (from the past 240 hour(s))  Blood Culture (routine x 2)     Status: None (Preliminary result)   Collection Time: 08/13/14 12:55 PM  Result Value Ref Range Status   Specimen Description BLOOD FOREARM RIGHT  Final   Special Requests BOTTLES DRAWN AEROBIC AND ANAEROBIC 5CC  Final   Culture   Final           BLOOD CULTURE RECEIVED NO GROWTH TO DATE CULTURE WILL BE HELD FOR 5 DAYS BEFORE ISSUING A FINAL NEGATIVE REPORT Performed at [facility]  Report Status PENDING  Incomplete  Blood Culture (routine x 2)     Status: None (Preliminary result)   Collection Time: 08/13/14  1:00 PM  Result Value Ref Range Status   Specimen Description BLOOD LEFT ANTECUBITAL  Final   Special Requests BOTTLES DRAWN AEROBIC AND ANAEROBIC 10MLS  Final   Culture   Final           BLOOD CULTURE RECEIVED NO GROWTH TO DATE CULTURE WILL BE HELD FOR 5 DAYS BEFORE ISSUING A FINAL NEGATIVE REPORT Performed at [facility]    Report Status PENDING  Incomplete  Urine culture     Status: None   Collection Time: 08/13/14  1:30 PM  Result Value Ref Range Status   Specimen Description URINE, CLEAN CATCH  Final   Special Requests NONE  Final   Colony Count NO GROWTH Performed at [facility]   Final   Culture NO GROWTH Performed at [facility]   Final   Report Status 08/14/2014 FINAL  Final  MRSA PCR Screening     Status: None   Collection Time: 08/13/14  7:18 PM  Result Value Ref Range Status   MRSA by PCR NEGATIVE NEGATIVE Final    Comment:        The GeneXpert MRSA Assay (FDA approved for NASAL specimens only), is one component of a comprehensive MRSA colonization surveillance program. It is not intended to diagnose MRSA infection nor to guide or monitor treatment for MRSA infections.   Culture, sputum-assessment     Status: None   Collection Time: 08/13/14 11:18 PM  Result Value  Ref Range Status   Specimen Description SPUTUM  Final   Special Requests NONE  Final   Sputum evaluation   Final    MICROSCOPIC FINDINGS SUGGEST THAT THIS SPECIMEN IS NOT REPRESENTATIVE OF LOWER RESPIRATORY SECRETIONS. PLEASE RECOLLECT. RESULT CALLED TO, READ BACK BY AND VERIFIED WITH: A. WEATHERFORD RN [phone] GREEN R    Report Status 08/14/2014 FINAL  Final  AFB culture with smear     Status: None (Preliminary result)   Collection Time: 08/14/14  4:25 PM  Result Value Ref Range Status   Specimen Description PLEURAL FLUID LEFT  Final   Special Requests NONE  Final   Acid Fast Smear   Final    NO ACID FAST BACILLI SEEN Performed at [facility]    Culture   Final    CULTURE WILL BE EXAMINED FOR 6 WEEKS BEFORE ISSUING A FINAL REPORT Performed at [facility]    Report Status PENDING  Incomplete  Body fluid culture     Status: None   Collection Time: 08/14/14  4:26 PM  Result Value Ref Range Status   Specimen Description PLEURAL FLUID LEFT  Final   Special Requests NONE  Final   Gram Stain   Final    RARE WBC PRESENT,BOTH PMN AND MONONUCLEAR NO ORGANISMS SEEN Performed at [facility]    Culture   Final    NO GROWTH 3 DAYS Performed at [facility]    Report Status 08/18/2014 FINAL  Final  Fungus Culture with Smear     Status: None (Preliminary result)   Collection Time: 08/14/14  4:26 PM  Result Value Ref Range Status   Specimen Description FLUID PLEURAL LEFT  Final   Special Requests NONE  Final   Fungal Smear   Final    NO YEAST OR FUNGAL ELEMENTS SEEN Performed at [facility]  Culture   Final    CULTURE IN PROGRESS FOR FOUR WEEKS Performed at [facility]    Report Status PENDING  Incomplete     Studies:  Recent x-ray studies have been reviewed in detail by the Attending Physician  Scheduled Meds:  Scheduled Meds: . aspirin  81 mg Oral Daily  . atorvastatin  40 mg Oral q1800  . bisoprolol  7.5 mg  Oral Daily  . clopidogrel  75 mg Oral Q breakfast  . furosemide  40 mg Oral BID  . lisinopril  7.5 mg Oral BID  . nicotine  21 mg Transdermal Daily  . piperacillin-tazobactam (ZOSYN)  IV  3.375 g Intravenous Q8H  . sodium chloride  3 mL Intravenous Q12H  . spironolactone  25 mg Oral Daily  . vancomycin  1,000 mg Intravenous Q8H  . Warfarin - Pharmacist Dosing Inpatient   Does not apply q1800    Time spent on care of this patient: 40 mins   [facility]  [facility] Office  [phone] Pager - [phone]  On-Call/Text Page:      [email or web address]      password [email or web address]  If 7PM-7AM, please contact night-coverage [email or web address] Password [email or web address] 08/18/2014, 1:44 PM   LOS: 5 days   Care during the described time interval was provided by me .  I have reviewed this patient's available data, including medical history, events of note, physical examination, radiology studies and test results as part of my evaluation  Carolyne Littles, MD [phone] Pager

## 2014-08-19 ENCOUNTER — Inpatient Hospital Stay (HOSPITAL_COMMUNITY): Payer: BLUE CROSS/BLUE SHIELD | Admitting: Anesthesiology

## 2014-08-19 ENCOUNTER — Encounter (HOSPITAL_COMMUNITY): Payer: Self-pay | Admitting: Certified Registered"

## 2014-08-19 ENCOUNTER — Encounter (HOSPITAL_COMMUNITY)
Admission: EM | Disposition: A | Discharge status: Home / Self Care | Payer: Self-pay | Source: Home / Self Care | Attending: Internal Medicine

## 2014-08-19 ENCOUNTER — Inpatient Hospital Stay (HOSPITAL_COMMUNITY): Payer: BLUE CROSS/BLUE SHIELD

## 2014-08-19 DIAGNOSIS — I4891 Unspecified atrial fibrillation: Secondary | ICD-10-CM

## 2014-08-19 HISTORY — PX: TEE WITHOUT CARDIOVERSION: SHX5443

## 2014-08-19 HISTORY — PX: CARDIOVERSION: SHX1299

## 2014-08-19 LAB — BASIC METABOLIC PANEL
Anion gap: 13 (ref 5–15)
BUN: 14 mg/dL (ref 6–23)
CHLORIDE: 97 mmol/L (ref 96–112)
CO2: 26 mmol/L (ref 19–32)
CREATININE: 0.95 mg/dL (ref 0.50–1.35)
Calcium: 8.6 mg/dL (ref 8.4–10.5)
GFR calc Af Amer: >90 mL/min (ref >90)
GFR calc non Af Amer: 88 mL/min — ABNORMAL LOW (ref >90)
Glucose, Bld: 154 mg/dL — ABNORMAL HIGH (ref 70–99)
POTASSIUM: 4 mmol/L (ref 3.5–5.1)
Sodium: 136 mmol/L (ref 135–145)

## 2014-08-19 LAB — CBC
HCT: 50.2 % (ref 39.0–52.0)
Hemoglobin: 16.2 g/dL (ref 13.0–17.0)
MCH: 31 pg (ref 26.0–34.0)
MCHC: 32.3 g/dL (ref 30.0–36.0)
MCV: 96.2 fL (ref 78.0–100.0)
Platelets: 221 10*3/uL (ref 150–400)
RBC: 5.22 MIL/uL (ref 4.22–5.81)
RDW: 13 % (ref 11.5–15.5)
WBC: 12 10*3/uL — ABNORMAL HIGH (ref 4.0–10.5)

## 2014-08-19 LAB — CULTURE, BLOOD (ROUTINE X 2): Culture: NO GROWTH

## 2014-08-19 LAB — MAGNESIUM: MAGNESIUM: 2.1 mg/dL (ref 1.5–2.5)

## 2014-08-19 LAB — HEPARIN LEVEL (UNFRACTIONATED): HEPARIN UNFRACTIONATED: 0.51 [IU]/mL (ref 0.30–0.70)

## 2014-08-19 LAB — PROTIME-INR
INR: 1.06 (ref 0.00–1.49)
PROTHROMBIN TIME: 14 s (ref 11.6–15.2)

## 2014-08-19 SURGERY — ECHOCARDIOGRAM, TRANSESOPHAGEAL
Anesthesia: Monitor Anesthesia Care

## 2014-08-19 MED ORDER — FUROSEMIDE 40 MG PO TABS
40.0000 mg | ORAL_TABLET | Freq: Every day | ORAL | Status: DC
  Administered 2014-08-19 – 2014-08-21 (×3): 40 mg via ORAL
  Filled 2014-08-19 (×3): qty 1

## 2014-08-19 MED ORDER — SODIUM CHLORIDE 0.9 % IV SOLN: INTRAVENOUS | Status: DC

## 2014-08-19 MED ORDER — WARFARIN SODIUM 7.5 MG PO TABS
7.5000 mg | ORAL_TABLET | Freq: Once | ORAL | Status: AC
  Administered 2014-08-19: 7.5 mg via ORAL
  Filled 2014-08-19 (×2): qty 1

## 2014-08-19 MED ORDER — AMIODARONE HCL 200 MG PO TABS
400.0000 mg | ORAL_TABLET | Freq: Two times a day (BID) | ORAL | Status: DC
  Administered 2014-08-19 – 2014-08-21 (×4): 400 mg via ORAL
  Filled 2014-08-19 (×5): qty 2

## 2014-08-19 MED ORDER — SODIUM CHLORIDE 0.9 % IV SOLN
INTRAVENOUS | Status: DC | PRN
  Administered 2014-08-19: 14:00:00 via INTRAVENOUS

## 2014-08-19 MED ORDER — BUTAMBEN-TETRACAINE-BENZOCAINE 2-2-14 % EX AERO
INHALATION_SPRAY | CUTANEOUS | Status: DC | PRN
  Administered 2014-08-19: 2 via TOPICAL

## 2014-08-19 MED ORDER — PROPOFOL INFUSION 10 MG/ML OPTIME
INTRAVENOUS | Status: DC | PRN
  Administered 2014-08-19: 120 ug/kg/min via INTRAVENOUS

## 2014-08-19 MED ORDER — PATIENT'S GUIDE TO USING COUMADIN BOOK
Freq: Once | Status: AC
  Administered 2014-08-19: 10:00:00
  Filled 2014-08-19: qty 1

## 2014-08-19 NOTE — Progress Notes (Signed)
Patient ID: Jason Spencer, male   DOB: [birth year], 64 y.o.   MRN: [id number]     SUBJECTIVE: Remains in atrial fibrillation this morning.  Denies chest pain, says breathing continues to improve.  He had RHC/LHC yesterday followed by PCI to RCA.     Has had left-sided thoracentesis, suspect parapneumonic effusion.  Has loculated right-sided effusion, evaluated by Dr Dorris Fetch, plan for VATS after antibiotic treatment of CAP in a few weeks.   Yesterday lasix and lisinopril increased. Denies SOB/CP.   Echo with EF 20-25%, moderately dilated RV with mildly decreased systolic function.   RHC/LHC (2/8) RA 11 RV 41/8 PA 43/20, mean 27 PCWP mean 16 LV 96/7 AO 104/64 Oxygen saturations: PA 57% AO 91% Cardiac Output (Fick) 3.75  Cardiac Index (Fick) 1.9 95% mid-distal RCA, s/p BMS to mid-distal RCA  Scheduled Meds: . aspirin  81 mg Oral Daily  . atorvastatin  40 mg Oral q1800  . bisoprolol  7.5 mg Oral Daily  . clopidogrel  75 mg Oral Q breakfast  . furosemide  40 mg Oral BID  . lisinopril  7.5 mg Oral BID  . methylPREDNISolone (SOLU-MEDROL) injection  60 mg Intravenous Q24H  . nicotine  21 mg Transdermal Daily  . piperacillin-tazobactam (ZOSYN)  IV  3.375 g Intravenous Q8H  . sodium chloride  3 mL Intravenous Q12H  . spironolactone  25 mg Oral Daily  . vancomycin  1,000 mg Intravenous Q8H  . Warfarin - Pharmacist Dosing Inpatient   Does not apply q1800   Continuous Infusions: . sodium chloride 10 mL/hr at 08/18/14 1300  . sodium chloride    . amiodarone 30 mg/hr (08/19/14 0519)  . heparin 1,400 Units/hr (08/18/14 1811)   PRN Meds:.acetaminophen, levalbuterol, ondansetron (ZOFRAN) IV, oxyCODONE-acetaminophen, sodium chloride    Filed Vitals:   08/18/14 1214 08/18/14 1704 08/18/14 2041 08/19/14 0452  BP: 109/74 102/86 105/70 111/77  Pulse:  76 76 52  Temp:  97.8 F (36.6 C) 98.6 F (37 C) 97.8 F (36.6 C)  TempSrc:  Oral Oral Oral  Resp: Height:   (1.753 m)    Weight:  181 lb (82.101 kg)  176 lb 12.9 oz (80.2 kg)  SpO2: 94% 95% 95% 94%    Intake/Output Summary (Last 24 hours) at 08/19/14 0741 Last data filed at 08/19/14 0453  Gross per 24 hour  Intake  664.9 ml  Output   3600 ml  Net -2935.1 ml    LABS: Basic Metabolic Panel:  Recent Labs  40/98/11 0253 08/18/14 1339 08/19/14 0503  NA 135  --  136  K 3.3* 4.0 4.0  CL 94*  --  97  CO2 27  --  26  GLUCOSE 104*  --  154*  BUN 18  --  14  CREATININE 1.07  --  0.95  CALCIUM 8.3*  --  8.6  MG  --  2.0 2.1   Liver Function Tests: No results for input(s): AST, ALT, ALKPHOS, BILITOT, PROT, ALBUMIN in the last 72 hours. No results for input(s): LIPASE, AMYLASE in the last 72 hours. CBC:  Recent Labs  08/18/14 0253 08/19/14 0503  WBC 10.9* 12.0*  HGB 14.6 16.2  HCT 46.2 50.2  MCV 97.7 96.2  PLT 215 221   Cardiac Enzymes: No results for input(s): CKTOTAL, CKMB, CKMBINDEX, TROPONINI in the last 72 hours. BNP: Invalid input(s): POCBNP D-Dimer: No results for input(s): DDIMER in the last 72 hours. Hemoglobin A1C: No results for  input(s): HGBA1C in the last 72 hours. Fasting Lipid Panel: No results for input(s): CHOL, HDL, LDLCALC, TRIG, CHOLHDL, LDLDIRECT in the last 72 hours. Thyroid Function Tests: No results for input(s): TSH, T4TOTAL, T3FREE, THYROIDAB in the last 72 hours.  Invalid input(s): FREET3 Anemia Panel: No results for input(s): VITAMINB12, FOLATE, FERRITIN, TIBC, IRON, RETICCTPCT in the last 72 hours.  RADIOLOGY: Ct Chest W Contrast  08/13/2014   CLINICAL DATA:  Diagnosed with pneumonia 6 days ago, shortness of breath, cough, weakness, smoker  EXAM: CT CHEST WITH CONTRAST  TECHNIQUE: Multidetector CT imaging of the chest was performed during intravenous contrast administration. Sagittal and coronal MPR images reconstructed from axial data set.  CONTRAST:  100mL OMNIPAQUE IOHEXOL 300 MG/ML  SOLN IV  COMPARISON:  Chest radiograph  08/13/2014  FINDINGS: Scattered atherosclerotic calcifications aorta and coronary arteries.  Aortic normal caliber.  Pulmonary arteries well opacified and grossly patent on nondedicated exam.  Scattered normal size thoracic lymph nodes.  Cardiac chambers appear enlarged.  Pleural calcifications posterior medial RIGHT hemi thorax.  Visualized upper abdomen unremarkable.  Loculated RIGHT pleural effusion.  Dependent LEFT pleural effusion.  Minimal scattered atelectasis in RIGHT lung with more significant compressive atelectasis in LEFT lower lobe.  Infiltrates identified in LEFT upper and LEFT lower lobes, particularly peripherally in the LEFT upper lobe where focal density is seen which could represent rounded atelectasis or pneumonia.  Emphysematous changes at apices and scattered peribronchial thickening.  No pneumothorax or acute osseous findings.  IMPRESSION: Emphysematous and bronchitic changes question COPD.  BILATERAL pleural effusions, loculated on RIGHT, with scattered infiltrates in LEFT upper and LEFT lower lobes.  Followup until resolution recommended to exclude underlying abnormalities particularly in the periphery of the LEFT upper lobe.  Nonspecific pleural calcifications in the RIGHT hemi thorax, could be related to prior hemothorax, empyema, less likely TB or asbestos exposure due to unilateral involvement.   Electronically Signed   By: Ulyses SouthwardMark  Boles M.D.   On: 08/13/2014 17:30   Dg Chest Port 1 View  08/16/2014   CLINICAL DATA:  Pneumonia  EXAM: PORTABLE CHEST - 1 VIEW  COMPARISON:  08/14/2014  FINDINGS: Mild patchy left upper lobe opacity, unchanged.  Stable pleural based mass in the lateral right hemithorax, corresponding to loculated pleural effusion on CT.  No pneumothorax.  Cardiomegaly.  IMPRESSION: Mild patchy left upper lobe opacity, suspicious for pneumonia, unchanged.  Stable pleural-based mass in the lateral right hemithorax, corresponding to loculated pleural effusion on CT.    Electronically Signed   By: Charline BillsSriyesh  Krishnan M.D.   On: 08/16/2014 09:05   Dg Chest Port 1 View  08/14/2014   CLINICAL DATA:  Shortness of breath, status post thoracentesis  EXAM: PORTABLE CHEST - 1 VIEW  COMPARISON:  CT chest dated 08/13/2014  FINDINGS: Pleural-based opacity along the lateral right hemithorax, corresponding to a loculated pleural effusion on CT.  Patchy opacity in the left upper lobe, suspicious for pneumonia, although improving when compared to prior chest radiographs.  No pneumothorax status post thoracentesis.  Cardiomegaly.  IMPRESSION: No pneumothorax status post thoracentesis.  Otherwise unchanged.   Electronically Signed   By: Charline BillsSriyesh  Krishnan M.D.   On: 08/14/2014 19:15   Dg Chest Port 1 View  08/13/2014   CLINICAL DATA:  Atrial fibrillation.  EXAM: PORTABLE CHEST - 1 VIEW  COMPARISON:  August 07, 2014.  FINDINGS: Stable cardiomediastinal silhouette. No pneumothorax is noted. Stable pleural opacity is seen along right lateral chest wall most consistent with loculated effusion, but  mass cannot be excluded. Fluid is also noted in the right minor fissure. Increased left perihilar opacity is noted concerning for pneumonia or possibly edema. Bony thorax is intact.  IMPRESSION: Increased left perihilar opacity is noted concerning for pneumonia or possibly edema.  Stable pleural-based opacity is noted along right lateral chest wall most consistent with loculated pleural effusion, with probable loculated fluid within the right minor fissure is well. However, CT scan of the chest is recommended to rule out pleural-based mass.   Electronically Signed   By: Roque Lias M.D.   On: 08/13/2014 14:00    PHYSICAL EXAM General: NAD Neck: JVP 5-6 cm cm, no thyromegaly or thyroid nodule.  Lungs: Clear  decreased breath sounds right base.  CV: Lateral PMI.  Heart irregular S1/S2, no S3/S4, no murmur.  1+ edema ankles.   Abdomen: Soft, nontender, no hepatosplenomegaly, no distention.    Neurologic: Alert and oriented x 3.  Psych: Normal affect. Extremities: No clubbing or cyanosis.   TELEMETRY: Reviewed telemetry pt in atrial fibrillation 80s-90s  ASSESSMENT AND PLAN: 64 yo with history of smoking presented with dyspnea and was found to be in atrial fibrillation with RVR and PNA + effusions on CXR.  He was clinically in CHF as well.  1. Atrial fibrillation: With RVR at admission.  Not sure how long he has been in atrial fibrillation. 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 Plan for TEE/DC-CV today. He is on amiodarone gtt. Amiodarone will increase our chances of getting/keeping him in NSR.  However, given his lung problems, this may not be a good long-term option.   - Will use bisoprolol as well (beta-1 selective given concern for COPD).  - He had BMS placed 08/17/14/   Will use warfarin rather than NOAC.  He is on heparin gtt now, overlapping to therapeutic INR.  Pharmacy dosing coumadin. May need Lovenox bridge.  2. Acute systolic CHF: EF 16-10% with mildly decreased RV systolic function.  Volume status improved. Cut back lasix. 40 mg daily. 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  Has CAD but does not explain the extent of his LV dysfunction. -  Continue bisoprolol and spironolactone. Continue lisinopril to 7.5 mg bid.  3. ID: Suspect CAP with parapneumonic effusion on left.  Currently on vanco/Zosyn.  He also has a right loculated effusion.  Plan per Dr Dorris Fetch to treat with abx then in a few weeks (would have to be at least 4 wks post-DCCV), plan VATS on right.  He is afebrile.  WBCs high but coming down after stopping Solumedrol.  4. Smoking: Needs to quit.  Possible COPD. Steroids stopped. 5. CAD: Had BMS to RCA 08/17/13. Will need ASA 81 + Plavix + warfarin x 1 month, then stop ASA.  Will continue Plavix + warfarin combination to finish a year.  However, if necessary to stop for VATS after 1 month, this would option  given use of BMS.  Continue statin.    CLEGG,AMY NP-C  08/19/2014 7:41 AM  Patient seen with NP, agree with the above note.  He is doing well. No changes to meds.  TEE-guided DCCV today. Will make amiodarone po after cardioversion.  He will need Lovenox bridge to therapeutic coumadin at home.   Marca Ancona 08/19/2014 8:12 AM

## 2014-08-19 NOTE — CV Procedure (Signed)
Procedure: TEE  Indication: Atrial fibrillation, pre-cardioversion  Sedation: Propofol per anesthesiology  Findings: Please see echo section for full report.  Normal LV size with mild LV hypertrophy.  Global hypokinesis with EF 20-25%.  Mildly dilated RV with moderately decreased systolic function.  Trivial MR.  Trivial TR.  Trileaflet aortic valve with no significant stenosis or regurgitation.  Mildly dilated left atrium with no LAA thrombus (some smoke was present).  Negative bubble study, no PFO or ASD.  Grade III plaque in the descending thoracic aorta.   May proceed to DCCV.   Jason Spencer 08/19/2014 2:44 PM

## 2014-08-19 NOTE — Discharge Instructions (Signed)
°Information on my medicine - Coumadin®   (Warfarin) ° °This medication education was reviewed with me or my healthcare representative as part of my discharge preparation.  The pharmacist that spoke with me during my hospital stay was:  Ronell Duffus Rhea, RPH ° °Why was Coumadin prescribed for you? °Coumadin was prescribed for you because you have a blood clot or a medical condition that can cause an increased risk of forming blood clots. Blood clots can cause serious health problems by blocking the flow of blood to the heart, lung, or brain. Coumadin can prevent harmful blood clots from forming. °As a reminder your indication for Coumadin is:   Stroke Prevention Because Of Atrial Fibrillation ° °What test will check on my response to Coumadin? °While on Coumadin (warfarin) you will need to have an INR test regularly to ensure that your dose is keeping you in the desired range. The INR (international normalized ratio) number is calculated from the result of the laboratory test called prothrombin time (PT). ° °If an INR APPOINTMENT HAS NOT ALREADY BEEN MADE FOR YOU please schedule an appointment to have this lab work done by your health care provider within 7 days. °Your INR goal is usually a number between:  2 to 3 or your provider may give you a more narrow range like 2-2.5.  Ask your health care provider during an office visit what your goal INR is. ° °What  do you need to  know  About  COUMADIN? °Take Coumadin (warfarin) exactly as prescribed by your healthcare provider about the same time each day.  DO NOT stop taking without talking to the doctor who prescribed the medication.  Stopping without other blood clot prevention medication to take the place of Coumadin may increase your risk of developing a new clot or stroke.  Get refills before you run out. ° °What do you do if you miss a dose? °If you miss a dose, take it as soon as you remember on the same day then continue your regularly scheduled regimen the  next day.  Do not take two doses of Coumadin at the same time. ° °Important Safety Information °A possible side effect of Coumadin (Warfarin) is an increased risk of bleeding. You should call your healthcare provider right away if you experience any of the following: °  Bleeding from an injury or your nose that does not stop. °  Unusual colored urine (red or dark brown) or unusual colored stools (red or black). °  Unusual bruising for unknown reasons. °  A serious fall or if you hit your head (even if there is no bleeding). ° °Some foods or medicines interact with Coumadin® (warfarin) and might alter your response to warfarin. To help avoid this: °  Eat a balanced diet, maintaining a consistent amount of Vitamin K. °  Notify your provider about major diet changes you plan to make. °  Avoid alcohol or limit your intake to 1 drink for women and 2 drinks for men per day. °(1 drink is 5 oz. wine, 12 oz. beer, or 1.5 oz. liquor.) ° °Make sure that ANY health care provider who prescribes medication for you knows that you are taking Coumadin (warfarin).  Also make sure the healthcare provider who is monitoring your Coumadin knows when you have started a new medication including herbals and non-prescription products. ° °Coumadin® (Warfarin)  Major Drug Interactions  °Increased Warfarin Effect Decreased Warfarin Effect  °Alcohol (large quantities) °Antibiotics (esp. Septra/Bactrim, Flagyl, Cipro) °Amiodarone (Cordarone) °Aspirin (  ASA) °Cimetidine (Tagamet) °Megestrol (Megace) °NSAIDs (ibuprofen, naproxen, etc.) °Piroxicam (Feldene) °Propafenone (Rythmol SR) °Propranolol (Inderal) °Isoniazid (INH) °Posaconazole (Noxafil) Barbiturates (Phenobarbital) °Carbamazepine (Tegretol) °Chlordiazepoxide (Librium) °Cholestyramine (Questran) °Griseofulvin °Oral Contraceptives °Rifampin °Sucralfate (Carafate) °Vitamin K  ° °Coumadin® (Warfarin) Major Herbal Interactions  °Increased Warfarin Effect Decreased Warfarin Effect   °Garlic °Ginseng °Ginkgo biloba Coenzyme Q10 °Green tea °St. John’s wort   ° °Coumadin® (Warfarin) FOOD Interactions  °Eat a consistent number of servings per week of foods HIGH in Vitamin K °(1 serving = ½ cup)  °Collards (cooked, or boiled & drained) °Kale (cooked, or boiled & drained) °Mustard greens (cooked, or boiled & drained) °Parsley *serving size only = ¼ cup °Spinach (cooked, or boiled & drained) °Swiss chard (cooked, or boiled & drained) °Turnip greens (cooked, or boiled & drained)  °Eat a consistent number of servings per week of foods MEDIUM-HIGH in Vitamin K °(1 serving = 1 cup)  °Asparagus (cooked, or boiled & drained) °Broccoli (cooked, boiled & drained, or raw & chopped) °Brussel sprouts (cooked, or boiled & drained) *serving size only = ½ cup °Lettuce, raw (green leaf, endive, romaine) °Spinach, raw °Turnip greens, raw & chopped  ° °These websites have more information on Coumadin (warfarin):  www.coumadin.com; °www.ahrq.gov/consumer/coumadin.htm; ° ° ° °

## 2014-08-19 NOTE — Anesthesia Postprocedure Evaluation (Signed)
Anesthesia Post Note  Patient: Jason StallingJarrett Akhavan  Procedure(s) Performed: Procedure(s) (LRB): TRANSESOPHAGEAL ECHOCARDIOGRAM (TEE) (N/A) CARDIOVERSION (N/A)  Anesthesia type: MAC  Patient location: PACU  Post pain: Pain level controlled  Post assessment: Post-op Vital signs reviewed  Last Vitals: BP 120/78 mmHg  Pulse 68  Temp(Src) 36.8 C (Oral)  Resp 18  Ht 5\' 9"  (1.753 m)  Wt 176 lb 12.9 oz (80.2 kg)  BMI 26.10 kg/m2  SpO2 100%  Post vital signs: Reviewed  Level of consciousness: awake  Complications: No apparent anesthesia complications

## 2014-08-19 NOTE — Progress Notes (Signed)
CARDIAC REHAB PHASE I   PRE:  Rate/Rhythm: 59 SB  BP:  Supine:   Sitting: 90/60  Standing:    SaO2: 94 RA  MODE:  Ambulation: 550 ft   POST:  Rate/Rhythm: 57 SB  BP:  Supine:   Sitting: 96/60  Standing:    SaO2: would not register 1540-1605 Assisted X 1 to ambulate. Pt able to walk 550 feet without c/o other than feeling a little weak. Gait steady VS stable Pt to side of bed after walk with call light in reach.  Melina CopaLisa Ilynn Stauffer RN 08/19/2014 4:05 PM

## 2014-08-19 NOTE — Progress Notes (Addendum)
ANTICOAGULATION CONSULT NOTE   Pharmacy Consult for Heparin/coumadin Indication: atrial fibrillation  No Known Allergies  Patient Measurements: Height: 5\' 9"  (175.3 cm) Weight: 176 lb 12.9 oz (80.2 kg) IBW/kg (Calculated) : 70.7   Vital Signs: Temp: 97.8 F (36.6 C) (02/10 0452) Temp Source: Oral (02/10 0452) BP: 111/77 mmHg (02/10 0452) Pulse Rate: 52 (02/10 0452)  Labs:  Recent Labs  08/17/14 0405 08/17/14 1310 08/18/14 0253 08/18/14 1042 08/19/14 0503  HGB 15.1  --  14.6  --  16.2  HCT 48.3  --  46.2  --  50.2  PLT 261  --  215  --  221  LABPROT  --  14.0 14.5  --  14.0  INR  --  1.07 1.11  --  1.06  HEPARINUNFRC 0.56  --   --  0.40 0.51  CREATININE 1.06  --  1.07  --  0.95    Estimated Creatinine Clearance: 81.7 mL/min (by C-G formula based on Cr of 0.95).   Medical History: Past Medical History  Diagnosis Date  . Atrial fibrillation with RVR 08/13/2014    new onset/notes 08/13/2014  . CAP (community acquired pneumonia) 07/2014  . Acute CHF     [name]/notes 08/13/2014  . Migraine 1970     "don't know why; they just left and haven't come back"   Assessment: 64 yo male with afib continues on heparin>warfarin bridge. Heparin continues to be at goal (0.5), INR still low at 1.0.  Possible lovenox bridge post cardioversion to allow patient to be discharged prior to therapeutic INR.   CBC stable, no bleeding issues noted. Plan to continue asa/plavix/warfarin x 1 then plavix/warfarin x 1 year.  Warfarin education done with patient, his mother is currently on warfarin so he was very familiar with side effects and monitoring. Explained drug and food interactions in more detail.   Also provided education on plavix and beta blocker and stressed importance of compliance with these meds.   CHADS2VASC- 1 (CHF)  Infectious Disease:no fevers, wbc 12, CAP was recently rx'ed levaquin x 1wk, plan to continue abx for now  2/6 VT 18.5  2/4 vanc>> 2/4 zoysn>> 2/4 bld x 2 -  ng 2/5 pleural fluid - ng  Goal of Therapy:  Vancomycin trough 15-20 Heparin level 0.3-0.7 INR 2-3 Monitor platelets by anticoagulation protocol: Yes   Plan:  - Continue Heparin at 1400 units/hr - coumadin 7.5mg  po x1 - INR daily - Would recommend lovenox 80mg  q12 hours if needed at discharge - Continue vancomycin 1g IV q8 hours, will consider recheck trough over weekend if to continue - Continue zosyn 3.375g IV q8 hours - follow up LOT for IV abx  Sheppard CoilFrank Wilson PharmD., BCPS Clinical Pharmacist Pager [phone] 08/19/2014 8:00 AM

## 2014-08-19 NOTE — Interval H&P Note (Signed)
History and Physical Interval Note:  08/19/2014 2:09 PM  Jason Spencer  has presented today for surgery, with the diagnosis of atrial fibrillation  The various methods of treatment have been discussed with the patient and family. After consideration of risks, benefits and other options for treatment, the patient has consented to  Procedure(s): TRANSESOPHAGEAL ECHOCARDIOGRAM (TEE) (N/A) CARDIOVERSION (N/A) as a surgical intervention .  The patient's history has been reviewed, patient examined, no change in status, stable for surgery.  I have reviewed the patient's chart and labs.  Questions were answered to the patient's satisfaction.     Callin Ashe [employer]

## 2014-08-19 NOTE — H&P (View-Only) (Signed)
Patient ID: Jason Spencer, male   DOB: 07/27/1950, 63 y.o.   MRN: [id number]     SUBJECTIVE: Remains in atrial fibrillation this morning.  Denies chest pain, says breathing continues to improve.  He had RHC/LHC yesterday followed by PCI to RCA.     Has had left-sided thoracentesis, suspect parapneumonic effusion.  Has loculated right-sided effusion, evaluated by Dr Hendrickson, plan for VATS after antibiotic treatment of CAP in a few weeks.   Yesterday lasix and lisinopril increased. Denies SOB/CP.   Echo with EF 20-25%, moderately dilated RV with mildly decreased systolic function.   RHC/LHC (2/8) RA 11 RV 41/8 PA 43/20, mean 27 PCWP mean 16 LV 96/7 AO 104/64 Oxygen saturations: PA 57% AO 91% Cardiac Output (Fick) 3.75  Cardiac Index (Fick) 1.9 95% mid-distal RCA, s/p BMS to mid-distal RCA  Scheduled Meds: . aspirin  81 mg Oral Daily  . atorvastatin  40 mg Oral q1800  . bisoprolol  7.5 mg Oral Daily  . clopidogrel  75 mg Oral Q breakfast  . furosemide  40 mg Oral BID  . lisinopril  7.5 mg Oral BID  . methylPREDNISolone (SOLU-MEDROL) injection  60 mg Intravenous Q24H  . nicotine  21 mg Transdermal Daily  . piperacillin-tazobactam (ZOSYN)  IV  3.375 g Intravenous Q8H  . sodium chloride  3 mL Intravenous Q12H  . spironolactone  25 mg Oral Daily  . vancomycin  1,000 mg Intravenous Q8H  . Warfarin - Pharmacist Dosing Inpatient   Does not apply q1800   Continuous Infusions: . sodium chloride 10 mL/hr at 08/18/14 1300  . sodium chloride    . amiodarone 30 mg/hr (08/19/14 0519)  . heparin 1,400 Units/hr (08/18/14 1811)   PRN Meds:.acetaminophen, levalbuterol, ondansetron (ZOFRAN) IV, oxyCODONE-acetaminophen, sodium chloride    Filed Vitals:   08/18/14 1214 08/18/14 1704 08/18/14 2041 08/19/14 0452  BP: 109/74 102/86 105/70 111/77  Pulse:  76 76 52  Temp:  97.8 F (36.6 C) 98.6 F (37 C) 97.8 F (36.6 C)  TempSrc:  Oral Oral Oral  Resp: 15 18 18 16    Height:  5' 9" (1.753 m)    Weight:  181 lb (82.101 kg)  176 lb 12.9 oz (80.2 kg)  SpO2: 94% 95% 95% 94%    Intake/Output Summary (Last 24 hours) at 08/19/14 0741 Last data filed at 08/19/14 0453  Gross per 24 hour  Intake  664.9 ml  Output   3600 ml  Net -2935.1 ml    LABS: Basic Metabolic Panel:  Recent Labs  08/18/14 0253 08/18/14 1339 08/19/14 0503  NA 135  --  136  K 3.3* 4.0 4.0  CL 94*  --  97  CO2 27  --  26  GLUCOSE 104*  --  154*  BUN 18  --  14  CREATININE 1.07  --  0.95  CALCIUM 8.3*  --  8.6  MG  --  2.0 2.1   Liver Function Tests: No results for input(s): AST, ALT, ALKPHOS, BILITOT, PROT, ALBUMIN in the last 72 hours. No results for input(s): LIPASE, AMYLASE in the last 72 hours. CBC:  Recent Labs  08/18/14 0253 08/19/14 0503  WBC 10.9* 12.0*  HGB 14.6 16.2  HCT 46.2 50.2  MCV 97.7 96.2  PLT 215 221   Cardiac Enzymes: No results for input(s): CKTOTAL, CKMB, CKMBINDEX, TROPONINI in the last 72 hours. BNP: Invalid input(s): POCBNP D-Dimer: No results for input(s): DDIMER in the last 72 hours. Hemoglobin A1C: No results for   input(s): HGBA1C in the last 72 hours. Fasting Lipid Panel: No results for input(s): CHOL, HDL, LDLCALC, TRIG, CHOLHDL, LDLDIRECT in the last 72 hours. Thyroid Function Tests: No results for input(s): TSH, T4TOTAL, T3FREE, THYROIDAB in the last 72 hours.  Invalid input(s): FREET3 Anemia Panel: No results for input(s): VITAMINB12, FOLATE, FERRITIN, TIBC, IRON, RETICCTPCT in the last 72 hours.  RADIOLOGY: Ct Chest W Contrast  08/13/2014   CLINICAL DATA:  Diagnosed with pneumonia 6 days ago, shortness of breath, cough, weakness, smoker  EXAM: CT CHEST WITH CONTRAST  TECHNIQUE: Multidetector CT imaging of the chest was performed during intravenous contrast administration. Sagittal and coronal MPR images reconstructed from axial data set.  CONTRAST:  100mL OMNIPAQUE IOHEXOL 300 MG/ML  SOLN IV  COMPARISON:  Chest radiograph  08/13/2014  FINDINGS: Scattered atherosclerotic calcifications aorta and coronary arteries.  Aortic normal caliber.  Pulmonary arteries well opacified and grossly patent on nondedicated exam.  Scattered normal size thoracic lymph nodes.  Cardiac chambers appear enlarged.  Pleural calcifications posterior medial RIGHT hemi thorax.  Visualized upper abdomen unremarkable.  Loculated RIGHT pleural effusion.  Dependent LEFT pleural effusion.  Minimal scattered atelectasis in RIGHT lung with more significant compressive atelectasis in LEFT lower lobe.  Infiltrates identified in LEFT upper and LEFT lower lobes, particularly peripherally in the LEFT upper lobe where focal density is seen which could represent rounded atelectasis or pneumonia.  Emphysematous changes at apices and scattered peribronchial thickening.  No pneumothorax or acute osseous findings.  IMPRESSION: Emphysematous and bronchitic changes question COPD.  BILATERAL pleural effusions, loculated on RIGHT, with scattered infiltrates in LEFT upper and LEFT lower lobes.  Followup until resolution recommended to exclude underlying abnormalities particularly in the periphery of the LEFT upper lobe.  Nonspecific pleural calcifications in the RIGHT hemi thorax, could be related to prior hemothorax, empyema, less likely TB or asbestos exposure due to unilateral involvement.   Electronically Signed   By: Mark  Boles M.D.   On: 08/13/2014 17:30   Dg Chest Port 1 View  08/16/2014   CLINICAL DATA:  Pneumonia  EXAM: PORTABLE CHEST - 1 VIEW  COMPARISON:  08/14/2014  FINDINGS: Mild patchy left upper lobe opacity, unchanged.  Stable pleural based mass in the lateral right hemithorax, corresponding to loculated pleural effusion on CT.  No pneumothorax.  Cardiomegaly.  IMPRESSION: Mild patchy left upper lobe opacity, suspicious for pneumonia, unchanged.  Stable pleural-based mass in the lateral right hemithorax, corresponding to loculated pleural effusion on CT.    Electronically Signed   By: Sriyesh  Krishnan M.D.   On: 08/16/2014 09:05   Dg Chest Port 1 View  08/14/2014   CLINICAL DATA:  Shortness of breath, status post thoracentesis  EXAM: PORTABLE CHEST - 1 VIEW  COMPARISON:  CT chest dated 08/13/2014  FINDINGS: Pleural-based opacity along the lateral right hemithorax, corresponding to a loculated pleural effusion on CT.  Patchy opacity in the left upper lobe, suspicious for pneumonia, although improving when compared to prior chest radiographs.  No pneumothorax status post thoracentesis.  Cardiomegaly.  IMPRESSION: No pneumothorax status post thoracentesis.  Otherwise unchanged.   Electronically Signed   By: Sriyesh  Krishnan M.D.   On: 08/14/2014 19:15   Dg Chest Port 1 View  08/13/2014   CLINICAL DATA:  Atrial fibrillation.  EXAM: PORTABLE CHEST - 1 VIEW  COMPARISON:  August 07, 2014.  FINDINGS: Stable cardiomediastinal silhouette. No pneumothorax is noted. Stable pleural opacity is seen along right lateral chest wall most consistent with loculated effusion, but   mass cannot be excluded. Fluid is also noted in the right minor fissure. Increased left perihilar opacity is noted concerning for pneumonia or possibly edema. Bony thorax is intact.  IMPRESSION: Increased left perihilar opacity is noted concerning for pneumonia or possibly edema.  Stable pleural-based opacity is noted along right lateral chest wall most consistent with loculated pleural effusion, with probable loculated fluid within the right minor fissure is well. However, CT scan of the chest is recommended to rule out pleural-based mass.   Electronically Signed   By: James  Green M.D.   On: 08/13/2014 14:00    PHYSICAL EXAM General: NAD Neck: JVP 5-6 cm cm, no thyromegaly or thyroid nodule.  Lungs: Clear  decreased breath sounds right base.  CV: Lateral PMI.  Heart irregular S1/S2, no S3/S4, no murmur.  1+ edema ankles.   Abdomen: Soft, nontender, no hepatosplenomegaly, no distention.    Neurologic: Alert and oriented x 3.  Psych: Normal affect. Extremities: No clubbing or cyanosis.   TELEMETRY: Reviewed telemetry pt in atrial fibrillation 80s-90s  ASSESSMENT AND PLAN: 63 yo with history of smoking presented with dyspnea and was found to be in atrial fibrillation with RVR and PNA + effusions on CXR.  He was clinically in CHF as well.  1. Atrial fibrillation: With RVR at admission.  Not sure how long he has been in atrial fibrillation. 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 Plan for TEE/DC-CV today. He is on amiodarone gtt. Amiodarone will increase our chances of getting/keeping him in NSR.  However, given his lung problems, this may not be a good long-term option.   - Will use bisoprolol as well (beta-1 selective given concern for COPD).  - He had BMS placed 08/17/14/   Will use warfarin rather than NOAC.  He is on heparin gtt now, overlapping to therapeutic INR.  Pharmacy dosing coumadin. May need Lovenox bridge.  2. Acute systolic CHF: EF 20-25% with mildly decreased RV systolic function.  Volume status improved. Cut back lasix. 40 mg daily. 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  Has CAD but does not explain the extent of his LV dysfunction. -  Continue bisoprolol and spironolactone. Continue lisinopril to 7.5 mg bid.  3. ID: Suspect CAP with parapneumonic effusion on left.  Currently on vanco/Zosyn.  He also has a right loculated effusion.  Plan per Dr Hendrickson to treat with abx then in a few weeks (would have to be at least 4 wks post-DCCV), plan VATS on right.  He is afebrile.  WBCs high but coming down after stopping Solumedrol.  4. Smoking: Needs to quit.  Possible COPD. Steroids stopped. 5. CAD: Had BMS to RCA 08/17/13. Will need ASA 81 + Plavix + warfarin x 1 month, then stop ASA.  Will continue Plavix + warfarin combination to finish a year.  However, if necessary to stop for VATS after 1 month, this would option  given use of BMS.  Continue statin.    CLEGG,Jason Spencer  08/19/2014 7:41 AM  Patient seen with NP, agree with the above note.  He is doing well. No changes to meds.  TEE-guided DCCV today. Will make amiodarone po after cardioversion.  He will need Lovenox bridge to therapeutic coumadin at home.   Jason Spencer 08/19/2014 8:12 AM  

## 2014-08-19 NOTE — Anesthesia Preprocedure Evaluation (Addendum)
Anesthesia Evaluation  Patient identified by MRN, date of birth, ID band Patient awake    Reviewed: Allergy & Precautions, NPO status , Patient's Chart, lab work & pertinent test results  Airway Mallampati: II  TM Distance: >3 FB Neck ROM: Full    Dental no notable dental hx.    Pulmonary pneumonia -, Current Smoker,  breath sounds clear to auscultation  Pulmonary exam normal       Cardiovascular + CAD, + Cardiac Stents and +CHF Rhythm:Regular Rate:Normal     Neuro/Psych  Headaches, negative psych ROS   GI/Hepatic negative GI ROS, Neg liver ROS,   Endo/Other  negative endocrine ROS  Renal/GU negative Renal ROS     Musculoskeletal negative musculoskeletal ROS (+)   Abdominal   Peds  Hematology negative hematology ROS (+)   Anesthesia Other Findings   Reproductive/Obstetrics negative OB ROS                            Anesthesia Physical Anesthesia Plan  ASA: III  Anesthesia Plan: MAC   Post-op Pain Management:    Induction: Intravenous  Airway Management Planned: Simple Face Mask  Additional Equipment:   Intra-op Plan:   Post-operative Plan:   Informed Consent: I have reviewed the patients History and Physical, chart, labs and discussed the procedure including the risks, benefits and alternatives for the proposed anesthesia with the patient or authorized representative who has indicated his/her understanding and acceptance.   Dental advisory given  Plan Discussed with: CRNA  Anesthesia Plan Comments:         Anesthesia Quick Evaluation

## 2014-08-19 NOTE — Procedures (Addendum)
Electrical Cardioversion Procedure Note Jason StallingJarrett Spencer [id number] 24-Jul-1950  Procedure: Electrical Cardioversion Indications:  Atrial Fibrillation  Procedure Details Consent: Risks of procedure as well as the alternatives and risks of each were explained to the (patient/caregiver).  Consent for procedure obtained. Time Out: Verified patient identification, verified procedure, site/side was marked, verified correct patient position, special equipment/implants available, medications/allergies/relevent history reviewed, required imaging and test results available.  Performed  Patient placed on cardiac monitor, pulse oximetry, supplemental oxygen as necessary.  Sedation given: Propofol Pacer pads placed anterior and posterior chest.  Cardioverted 1 time(s).  Cardioverted at 200J.  Evaluation Findings: Post procedure EKG shows: NSR Complications: None Patient did tolerate procedure well.   Jason Spencer 08/19/2014, 2:44 PM

## 2014-08-19 NOTE — Transfer of Care (Signed)
Immediate Anesthesia Transfer of Care Note  Patient: Jason Spencer  Procedure(s) Performed: Procedure(s): TRANSESOPHAGEAL ECHOCARDIOGRAM (TEE) (N/A) CARDIOVERSION (N/A)  Patient Location: PACU  Anesthesia Type:MAC  Level of Consciousness: awake, alert , oriented and patient cooperative  Airway & Oxygen Therapy: Patient Spontanous Breathing and Patient connected to nasal cannula oxygen  Post-op Assessment: Report given to RN, Post -op Vital signs reviewed and stable and Patient moving all extremities  Post vital signs: Reviewed and stable  Last Vitals:  Filed Vitals:   08/19/14 1328  BP: 116/81  Pulse: 75  Temp: 36.4 C  Resp:     Complications: No apparent anesthesia complications

## 2014-08-19 NOTE — Progress Notes (Signed)
[facility] TEAM 1 - Stepdown/ICU TEAM Progress Note  Jason Spencer MCN:[id number] DOB: 1951/05/31 DOA: 08/13/2014 PCP: Rudi HeapMOORE, DONALD, MD  Admit HPI / Brief Narrative: 64 y.o. Male with no significant past medical history except for tobacco abuse stating it has been "years" since he has been to see a physician was [facility]. Patient recently was treated for community-acquired pneumonia with Levaquin. Was instructed at that time to establish with a primary care physician. Upon presentation to the PCP office he was found to have a rapid irregular heart rate and was sent directly to [facility] EMS.   Upon arrival to the emergency department patient was found to be in atrial fibrillation with RVR and was bolused and subsequently started on a Cardizem infusion. Heart rate has decreased from 137 to 108 but he remained in atrial fibrillation. Chest x-ray revealed increased left perihilar opacity concerning for pneumonia or possibly edema. There was also a stable pleural-based opacity in the right lateral chest wall most consistent with loculated pleural effusion. Patient had mild leukocytosis and was given empiric antibiotics to cover for pneumonia. Patient stated he has had upper respiratory symptoms for 1-1/2 months. He initially took some leftover Zithromax from a friend. He was seen at the urgent care on the 29th and was started on antibiotics with some improvement in his symptoms. For several weeks he noticed increasing lower extremity edema, orthopnea and mild dyspnea on exertion. He has noticed the edema has worsened over the past 48 hours.   HPI/Subjective: No new complaints today.  Says he is ready for DCCV.  Denies cp, sob, n/v, or abdom pain.    Assessment/Plan:  Acute hypoxic respiratory failure - CAP + acute COPD exac + CHF exac  tx each individual issue and complete w/u  R pleural based loculated effusion  TCTS  has seen and plans an eventual VATS once all other issues stabilized (weeks away)  CAP - L pleural effusion  Cont empiric abx tx - now s/p thoracentesis per PCCM - clinically stabilizing    Newly diagnosed severe systolic CHF - EF 20-25% combination of tachy mediated (new afib) + ischemic - Cards following - cont in attempts to diurese - wgt at admit 90.7kg - current weight 80.2 - net negative ~12.5L - LE edema essentially resolved - now on ACE  CAD - 95% RCA lesion S/p PTCA w/ deployment of 2 BMSs - ASA + Plavix for 4 weeks, then to continue Plavix alone for 1 year   Probable COPD - Tobacco abuse  Cont usual tx regimen - no formal diagnosis previously as pt has not received prior medical care - no wheezing today - acute exacerbation resolved - pt has been educated that he must NEVER smoke again   Newly diagnosed Afib w RVR On heparin gtt - CCB changed to amio given low EF - Cards following - TSH normal - rate currently well controlled - to undergo DCCV today    Mild hypokalemia Due to diuresis - replaced to goal of 4.0   Hyperglycemia  Check A1c  Code Status: FULL Family Communication: no family present at time of exam Disposition Plan: tele bed - for DCCV today   Consultants: [facility]  PCCM TCTS  Procedures: 2/4 TTE - EF 20-25% - diffuse hypokinesis - moderate RAE/RVE; mildly reduced RV function 2/4 LE venous dopplers - negative for DVT  2/8 - R and L heart cath -  severe (  95%) distal RCA stenosis - RA 11  RV 41/8  PA 43/20, mean 27  PCWP mean 16 2/8 - angioplasty of the distal RCA w/ deployment of BMS x2  Antibiotics: Azithro 2/4 > 2/5 Rocephin 2/4 > 2/5 Zosyn 2/4 > Vanc 2/4 >  DVT prophylaxis: IV heparin   Objective: Blood pressure 111/77, pulse 52, temperature 97.8 F (36.6 C), temperature source Oral, resp. rate 16, height 5\' 9"  (1.753 m), weight 80.2 kg (176 lb 12.9 oz), SpO2 94 %.  Intake/Output Summary (Last 24 hours) at 08/19/14 0804 Last data  filed at 08/19/14 0453  Gross per 24 hour  Intake  664.9 ml  Output   3600 ml  Net -2935.1 ml    Exam: General: no acute resp distress - alert and oriented   Lungs: CTA th/o - no wheeze  Cardiovascular: irreg irreg - rate controlled - no appreciable M Abdomen: Nontender, nondistended, soft, bowel sounds positive, no rebound, no ascites, no appreciable mass Extremities: No significant cyanosis, clubbing, or edema bilateral lower extremities   Data Reviewed: Basic Metabolic Panel:  Recent Labs Lab 08/15/14 0256 08/16/14 0410 08/17/14 0405 08/18/14 0253 08/18/14 1339 08/19/14 0503  NA 135 139 137 135  --  136  K 3.7 3.4* 4.0 3.3* 4.0 4.0  CL 94* 92* 91* 94*  --  97  CO2 33* 36* 37* 27  --  26  GLUCOSE 143* 127* 115* 104*  --  154*  BUN 16 16 16 18   --  14  CREATININE 0.91 1.01 1.06 1.07  --  0.95  CALCIUM 8.3* 8.2* 8.5 8.3*  --  8.6  MG  --   --  2.2  --  2.0 2.1    Liver Function Tests:  Recent Labs Lab 08/13/14 1255 08/14/14 1906 08/15/14 0256  AST 38*  --  34  ALT 41  --  41  ALKPHOS 76  --  73  BILITOT 0.8  --  0.8  PROT 6.7 6.7 6.7  ALBUMIN 2.6*  --  2.3*   CBC:  Recent Labs Lab 08/13/14 1255  08/15/14 0256 08/16/14 0410 08/17/14 0405 08/18/14 0253 08/19/14 0503  WBC 10.9*  < > 22.5* 20.1* 16.7* 10.9* 12.0*  NEUTROABS 8.2*  --   --   --   --   --   --   HGB 14.1  < > 14.2 14.6 15.1 14.6 16.2  HCT 44.5  < > 44.2 46.0 48.3 46.2 50.2  MCV 98.5  < > 96.5 97.9 100.4* 97.7 96.2  PLT 260  < > 255 254 261 215 221  < > = values in this interval not displayed. BNP (last 3 results)  Recent Labs  08/13/14 1755  BNP 1223.4*    Recent Results (from the past 240 hour(s))  Blood Culture (routine x 2)     Status: None (Preliminary result)   Collection Time: 08/13/14 12:55 PM  Result Value Ref Range Status   Specimen Description BLOOD FOREARM RIGHT  Final   Special Requests BOTTLES DRAWN AEROBIC AND ANAEROBIC 5CC  Final   Culture   Final            BLOOD CULTURE RECEIVED NO GROWTH TO DATE CULTURE WILL BE HELD FOR 5 DAYS BEFORE ISSUING A FINAL NEGATIVE REPORT Performed at [facility]    Report Status PENDING  Incomplete  Blood Culture (routine x 2)     Status: None (Preliminary result)   Collection Time: 08/13/14  1:00 PM  Result  Value Ref Range Status   Specimen Description BLOOD LEFT ANTECUBITAL  Final   Special Requests BOTTLES DRAWN AEROBIC AND ANAEROBIC  Final   Culture   Final           BLOOD CULTURE RECEIVED NO GROWTH TO DATE CULTURE WILL BE HELD FOR 5 DAYS BEFORE ISSUING A FINAL NEGATIVE REPORT Performed at [facility]    Report Status PENDING  Incomplete  Urine culture     Status: None   Collection Time: 08/13/14  1:30 PM  Result Value Ref Range Status   Specimen Description URINE, CLEAN CATCH  Final   Special Requests NONE  Final   Colony Count NO GROWTH Performed at [facility]   Final   Culture NO GROWTH Performed at [facility]   Final   Report Status 08/14/2014 FINAL  Final  MRSA PCR Screening     Status: None   Collection Time: 08/13/14  7:18 PM  Result Value Ref Range Status   MRSA by PCR NEGATIVE NEGATIVE Final    Comment:        The GeneXpert MRSA Assay (FDA approved for NASAL specimens only), is one component of a comprehensive MRSA colonization surveillance program. It is not intended to diagnose MRSA infection nor to guide or monitor treatment for MRSA infections.   Culture, sputum-assessment     Status: None   Collection Time: 08/13/14 11:18 PM  Result Value Ref Range Status   Specimen Description SPUTUM  Final   Special Requests NONE  Final   Sputum evaluation   Final    MICROSCOPIC FINDINGS SUGGEST THAT THIS SPECIMEN IS NOT REPRESENTATIVE OF LOWER RESPIRATORY SECRETIONS. PLEASE RECOLLECT. RESULT CALLED TO, READ BACK BY AND VERIFIED WITH: A. WEATHERFORD RN [phone] GREEN R    Report Status 08/14/2014 FINAL  Final  AFB culture with smear      Status: None (Preliminary result)   Collection Time: 08/14/14  4:25 PM  Result Value Ref Range Status   Specimen Description PLEURAL FLUID LEFT  Final   Special Requests NONE  Final   Acid Fast Smear   Final    NO ACID FAST BACILLI SEEN Performed at [facility]    Culture   Final    CULTURE WILL BE EXAMINED FOR 6 WEEKS BEFORE ISSUING A FINAL REPORT Performed at [facility]    Report Status PENDING  Incomplete  Body fluid culture     Status: None   Collection Time: 08/14/14  4:26 PM  Result Value Ref Range Status   Specimen Description PLEURAL FLUID LEFT  Final   Special Requests NONE  Final   Gram Stain   Final    RARE WBC PRESENT,BOTH PMN AND MONONUCLEAR NO ORGANISMS SEEN Performed at [facility]    Culture   Final    NO GROWTH 3 DAYS Performed at [facility]    Report Status 08/18/2014 FINAL  Final  Fungus Culture with Smear     Status: None (Preliminary result)   Collection Time: 08/14/14  4:26 PM  Result Value Ref Range Status   Specimen Description FLUID PLEURAL LEFT  Final   Special Requests NONE  Final   Fungal Smear   Final    NO YEAST OR FUNGAL ELEMENTS SEEN Performed at [facility]    Culture   Final    CULTURE IN PROGRESS FOR FOUR WEEKS Performed at [facility]    Report Status PENDING  Incomplete  Studies:  Recent x-ray studies have been reviewed in detail by the Attending Physician  Scheduled Meds:  Scheduled Meds: . aspirin  81 mg Oral Daily  . atorvastatin  40 mg Oral q1800  . bisoprolol  7.5 mg Oral Daily  . clopidogrel  75 mg Oral Q breakfast  . furosemide  40 mg Oral Daily  . lisinopril  7.5 mg Oral BID  . methylPREDNISolone (SOLU-MEDROL) injection  60 mg Intravenous Q24H  . nicotine  21 mg Transdermal Daily  . piperacillin-tazobactam (ZOSYN)  IV  3.375 g Intravenous Q8H  . sodium chloride  3 mL Intravenous Q12H  . spironolactone  25 mg Oral Daily  . vancomycin  1,000 mg  Intravenous Q8H  . Warfarin - Pharmacist Dosing Inpatient   Does not apply q1800    Time spent on care of this patient: 35 mins   [place]ris [place] T , MD   [facility] Office  [phone] Pager - Text Page per Amion as per below:  On-Call/Text Page:      [email or web address]      password [email or web address]  If 7PM-7AM, please contact night-coverage [email or web address] Password [email or web address] 08/19/2014, 8:04 AM   LOS: 6 days

## 2014-08-20 ENCOUNTER — Encounter (HOSPITAL_COMMUNITY): Payer: Self-pay | Admitting: Cardiology

## 2014-08-20 ENCOUNTER — Encounter (HOSPITAL_COMMUNITY): Payer: Self-pay | Admitting: Adult Health

## 2014-08-20 DIAGNOSIS — Z720 Tobacco use: Secondary | ICD-10-CM | POA: Diagnosis present

## 2014-08-20 DIAGNOSIS — J441 Chronic obstructive pulmonary disease with (acute) exacerbation: Secondary | ICD-10-CM | POA: Diagnosis present

## 2014-08-20 DIAGNOSIS — I4891 Unspecified atrial fibrillation: Secondary | ICD-10-CM | POA: Diagnosis present

## 2014-08-20 LAB — BASIC METABOLIC PANEL
Anion gap: 8 (ref 5–15)
BUN: 13 mg/dL (ref 6–23)
CHLORIDE: 102 mmol/L (ref 96–112)
CO2: 28 mmol/L (ref 19–32)
CREATININE: 0.84 mg/dL (ref 0.50–1.35)
Calcium: 8.4 mg/dL (ref 8.4–10.5)
GFR calc Af Amer: >90 mL/min (ref >90)
GFR calc non Af Amer: >90 mL/min (ref >90)
GLUCOSE: 278 mg/dL — AB (ref 70–99)
Potassium: 3.7 mmol/L (ref 3.5–5.1)
Sodium: 138 mmol/L (ref 135–145)

## 2014-08-20 LAB — CULTURE, BLOOD (ROUTINE X 2): Culture: NO GROWTH

## 2014-08-20 LAB — CBC
HEMATOCRIT: 47.7 % (ref 39.0–52.0)
HEMOGLOBIN: 15 g/dL (ref 13.0–17.0)
MCH: 31 pg (ref 26.0–34.0)
MCHC: 31.4 g/dL (ref 30.0–36.0)
MCV: 98.6 fL (ref 78.0–100.0)
Platelets: 226 10*3/uL (ref 150–400)
RBC: 4.84 MIL/uL (ref 4.22–5.81)
RDW: 12.9 % (ref 11.5–15.5)
WBC: 12.3 10*3/uL — AB (ref 4.0–10.5)

## 2014-08-20 LAB — PROTIME-INR
INR: 1.21 (ref 0.00–1.49)
Prothrombin Time: 15.4 seconds — ABNORMAL HIGH (ref 11.6–15.2)

## 2014-08-20 LAB — HEPARIN LEVEL (UNFRACTIONATED): HEPARIN UNFRACTIONATED: 0.78 [IU]/mL — AB (ref 0.30–0.70)

## 2014-08-20 MED ORDER — WARFARIN SODIUM 7.5 MG PO TABS
7.5000 mg | ORAL_TABLET | Freq: Once | ORAL | Status: AC
  Administered 2014-08-20: 7.5 mg via ORAL
  Filled 2014-08-20: qty 1

## 2014-08-20 MED ORDER — WARFARIN SODIUM 7.5 MG PO TABS
7.5000 mg | ORAL_TABLET | Freq: Once | ORAL | Status: DC
  Filled 2014-08-20: qty 1

## 2014-08-20 MED ORDER — OFF THE BEAT BOOK
Freq: Once | Status: AC
  Administered 2014-08-20: 11:00:00
  Filled 2014-08-20: qty 1

## 2014-08-20 MED ORDER — LISINOPRIL 10 MG PO TABS
10.0000 mg | ORAL_TABLET | Freq: Two times a day (BID) | ORAL | Status: DC
  Administered 2014-08-20 – 2014-08-21 (×3): 10 mg via ORAL
  Filled 2014-08-20 (×5): qty 1

## 2014-08-20 MED ORDER — ENOXAPARIN (LOVENOX) PATIENT EDUCATION KIT
PACK | Freq: Once | Status: AC
Start: 2014-08-20 — End: 2014-08-20
  Administered 2014-08-20: 08:00:00
  Filled 2014-08-20: qty 1

## 2014-08-20 MED ORDER — ENOXAPARIN SODIUM 80 MG/0.8ML ~~LOC~~ SOLN
80.0000 mg | Freq: Two times a day (BID) | SUBCUTANEOUS | Status: DC
  Administered 2014-08-20 – 2014-08-21 (×3): 80 mg via SUBCUTANEOUS
  Filled 2014-08-20 (×5): qty 0.8

## 2014-08-20 NOTE — Progress Notes (Signed)
Patient ID: Jason Spencer, male   DOB: 1950-10-07, 64 y.o.   MRN: [id number]     SUBJECTIVE: Remains in atrial fibrillation this morning.  Denies chest pain, says breathing continues to improve.  He had RHC/LHC yesterday followed by PCI to RCA.     Has had left-sided thoracentesis, suspect parapneumonic effusion.  Has loculated right-sided effusion, evaluated by Dr Dorris FetchHendrickson, plan for VATS after antibiotic treatment of CAP in a few weeks.   Yesterday had successful cardioversion.  Off amiodarone drip and on po. Maintaining NSR.  Denies SOB.   Echo with EF 20-25%, moderately dilated RV with mildly decreased systolic function.   RHC/LHC (2/8) RA 11 RV 41/8 PA 43/20, mean 27 PCWP mean 16 LV 96/7 AO 104/64 Oxygen saturations: PA 57% AO 91% Cardiac Output (Fick) 3.75  Cardiac Index (Fick) 1.9 95% mid-distal RCA, s/p BMS to mid-distal RCA  Scheduled Meds: . amiodarone  400 mg Oral BID  . aspirin  81 mg Oral Daily  . atorvastatin  40 mg Oral q1800  . bisoprolol  7.5 mg Oral Daily  . clopidogrel  75 mg Oral Q breakfast  . furosemide  40 mg Oral Daily  . lisinopril  7.5 mg Oral BID  . methylPREDNISolone (SOLU-MEDROL) injection  60 mg Intravenous Q24H  . nicotine  21 mg Transdermal Daily  . piperacillin-tazobactam (ZOSYN)  IV  3.375 g Intravenous Q8H  . sodium chloride  3 mL Intravenous Q12H  . spironolactone  25 mg Oral Daily  . vancomycin  1,000 mg Intravenous Q8H  . Warfarin - Pharmacist Dosing Inpatient   Does not apply q1800   Continuous Infusions: . sodium chloride 10 mL/hr at 08/18/14 1300  . sodium chloride    . sodium chloride    . sodium chloride    . heparin 1,400 Units/hr (08/19/14 1800)   PRN Meds:.acetaminophen, levalbuterol, ondansetron (ZOFRAN) IV, oxyCODONE-acetaminophen, sodium chloride    Filed Vitals:   08/19/14 1508 08/19/14 1533 08/19/14 2104 08/20/14 0512  BP: 104/70 120/78 105/52 115/56  Pulse: 54 68 60 58  Temp:  98.3 F (36.8 C)  98.2 F (36.8 C) 97.4 F (36.3 C)  TempSrc:  Oral Oral Oral  Resp: 12 18 18 18   Height:      Weight:    182 lb 8.7 oz (82.8 kg)  SpO2: 94% 100% 95% 94%    Intake/Output Summary (Last 24 hours) at 08/20/14 0721 Last data filed at 08/20/14 0513  Gross per 24 hour  Intake    300 ml  Output    450 ml  Net   -150 ml    LABS: Basic Metabolic Panel:  Recent Labs  40/98/1101/03/25 1339 08/19/14 0503 08/20/14 0543  NA  --  136 138  K 4.0 4.0 3.7  CL  --  97 102  CO2  --  26 28  GLUCOSE  --  154* 278*  BUN  --  14 13  CREATININE  --  0.95 0.84  CALCIUM  --  8.6 8.4  MG 2.0 2.1  --    Liver Function Tests: No results for input(s): AST, ALT, ALKPHOS, BILITOT, PROT, ALBUMIN in the last 72 hours. No results for input(s): LIPASE, AMYLASE in the last 72 hours. CBC:  Recent Labs  08/19/14 0503 08/20/14 0543  WBC 12.0* 12.3*  HGB 16.2 15.0  HCT 50.2 47.7  MCV 96.2 98.6  PLT 221 226   Cardiac Enzymes: No results for input(s): CKTOTAL, CKMB, CKMBINDEX, TROPONINI in the last 72 hours.  BNP: Invalid input(s): POCBNP D-Dimer: No results for input(s): DDIMER in the last 72 hours. Hemoglobin A1C: No results for input(s): HGBA1C in the last 72 hours. Fasting Lipid Panel: No results for input(s): CHOL, HDL, LDLCALC, TRIG, CHOLHDL, LDLDIRECT in the last 72 hours. Thyroid Function Tests: No results for input(s): TSH, T4TOTAL, T3FREE, THYROIDAB in the last 72 hours.  Invalid input(s): FREET3 Anemia Panel: No results for input(s): VITAMINB12, FOLATE, FERRITIN, TIBC, IRON, RETICCTPCT in the last 72 hours.  RADIOLOGY: Dg Chest 2 View  08/19/2014   CLINICAL DATA:  Pneumonia  EXAM: CHEST  2 VIEW  COMPARISON:  08/16/2014  FINDINGS: Mild cardiomegaly. Loculated pleural effusion in the right lateral upper hemi thorax is stable. Normal vascularity. Patchy opacity in the left upper lung zone laterally unchanged. No pneumothorax.  IMPRESSION: Cardiomegaly without decompensation  Stable loculated  right pleural effusion.  Mild patchy density in the left upper lobe unchanged.   Electronically Signed   By: Jolaine Click M.D.   On: 08/19/2014 08:07   Ct Chest W Contrast  08/13/2014   CLINICAL DATA:  Diagnosed with pneumonia 6 days ago, shortness of breath, cough, weakness, smoker  EXAM: CT CHEST WITH CONTRAST  TECHNIQUE: Multidetector CT imaging of the chest was performed during intravenous contrast administration. Sagittal and coronal MPR images reconstructed from axial data set.  CONTRAST:  OMNIPAQUE IOHEXOL 300 MG/ML  SOLN IV  COMPARISON:  Chest radiograph 08/13/2014  FINDINGS: Scattered atherosclerotic calcifications aorta and coronary arteries.  Aortic normal caliber.  Pulmonary arteries well opacified and grossly patent on nondedicated exam.  Scattered normal size thoracic lymph nodes.  Cardiac chambers appear enlarged.  Pleural calcifications posterior medial RIGHT hemi thorax.  Visualized upper abdomen unremarkable.  Loculated RIGHT pleural effusion.  Dependent LEFT pleural effusion.  Minimal scattered atelectasis in RIGHT lung with more significant compressive atelectasis in LEFT lower lobe.  Infiltrates identified in LEFT upper and LEFT lower lobes, particularly peripherally in the LEFT upper lobe where focal density is seen which could represent rounded atelectasis or pneumonia.  Emphysematous changes at apices and scattered peribronchial thickening.  No pneumothorax or acute osseous findings.  IMPRESSION: Emphysematous and bronchitic changes question COPD.  BILATERAL pleural effusions, loculated on RIGHT, with scattered infiltrates in LEFT upper and LEFT lower lobes.  Followup until resolution recommended to exclude underlying abnormalities particularly in the periphery of the LEFT upper lobe.  Nonspecific pleural calcifications in the RIGHT hemi thorax, could be related to prior hemothorax, empyema, less likely TB or asbestos exposure due to unilateral involvement.   Electronically Signed    By: Ulyses Southward M.D.   On: 08/13/2014 17:30   Dg Chest Port 1 View  08/16/2014   CLINICAL DATA:  Pneumonia  EXAM: PORTABLE CHEST - 1 VIEW  COMPARISON:  08/14/2014  FINDINGS: Mild patchy left upper lobe opacity, unchanged.  Stable pleural based mass in the lateral right hemithorax, corresponding to loculated pleural effusion on CT.  No pneumothorax.  Cardiomegaly.  IMPRESSION: Mild patchy left upper lobe opacity, suspicious for pneumonia, unchanged.  Stable pleural-based mass in the lateral right hemithorax, corresponding to loculated pleural effusion on CT.   Electronically Signed   By: Charline Bills M.D.   On: 08/16/2014 09:05   Dg Chest Port 1 View  08/14/2014   CLINICAL DATA:  Shortness of breath, status post thoracentesis  EXAM: PORTABLE CHEST - 1 VIEW  COMPARISON:  CT chest dated 08/13/2014  FINDINGS: Pleural-based opacity along the lateral right hemithorax, corresponding to a loculated  pleural effusion on CT.  Patchy opacity in the left upper lobe, suspicious for pneumonia, although improving when compared to prior chest radiographs.  No pneumothorax status post thoracentesis.  Cardiomegaly.  IMPRESSION: No pneumothorax status post thoracentesis.  Otherwise unchanged.   Electronically Signed   By: Charline Bills M.D.   On: 08/14/2014 19:15   Dg Chest Port 1 View  08/13/2014   CLINICAL DATA:  Atrial fibrillation.  EXAM: PORTABLE CHEST - 1 VIEW  COMPARISON:  August 07, 2014.  FINDINGS: Stable cardiomediastinal silhouette. No pneumothorax is noted. Stable pleural opacity is seen along right lateral chest wall most consistent with loculated effusion, but mass cannot be excluded. Fluid is also noted in the right minor fissure. Increased left perihilar opacity is noted concerning for pneumonia or possibly edema. Bony thorax is intact.  IMPRESSION: Increased left perihilar opacity is noted concerning for pneumonia or possibly edema.  Stable pleural-based opacity is noted along right lateral chest wall  most consistent with loculated pleural effusion, with probable loculated fluid within the right minor fissure is well. However, CT scan of the chest is recommended to rule out pleural-based mass.   Electronically Signed   By: Roque Lias M.D.   On: 08/13/2014 14:00    PHYSICAL EXAM General: NAD Neck: JVP 5-6 cm cm, no thyromegaly or thyroid nodule.  Lungs: Clear  decreased breath sounds right base.  CV: Lateral PMI.  Heart Regular S1/S2, no S3/S4, no murmur.  No lower extremity edema.    Abdomen: Soft, nontender, no hepatosplenomegaly, no distention.  Neurologic: Alert and oriented x 3.  Psych: Normal affect. Extremities: No clubbing or cyanosis.   TELEMETRY: NSR 60s ASSESSMENT AND PLAN: 64 yo with history of smoking presented with dyspnea and was found to be in atrial fibrillation with RVR and PNA + effusions on CXR.  He was clinically in CHF as well.  1. Atrial fibrillation: With RVR at admission.  Not sure how long he has been in atrial fibrillation. 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 Had successful DC-CV. Maintaining NSR. Continue amiodarone 400 mg twice a day for 1 week then transition to 200 mg twice a day.    - Will use bisoprolol as well (beta-1 selective given concern for COPD).  - He had BMS placed 08/17/14. Will use warfarin rather than NOAC.  Stop heparin drip. Will need to bridge with lovenox. INR 1.2. Will set up [facility]. Plan to check INR Monday 2. Acute systolic CHF: EF 16-10% with mildly decreased RV systolic function.  Volume status improved. Continue Lasix 40 mg daily. 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  Has CAD but does not explain the extent of his LV dysfunction. -  Continue bisoprolol and spironolactone. Increase lisinopril to 10 mg BID.   3. ID: Suspect CAP with parapneumonic effusion on left.  Currently on vanco/Zosyn.  He also has a right loculated effusion.  Plan per Dr Dorris Fetch  to treat with abx then in a few weeks (would have to be at least 4 wks post-DCCV and also BMS), plan VATS on right.  He is afebrile.  WBCs high but coming down after stopping Solumedrol. He will need followup with Dr Dorris Fetch. 4. Smoking: Needs to quit.  Possible COPD. Steroids stopped. 5. CAD: Had BMS to RCA 08/17/13. Will need ASA 81 + Plavix + warfarin x 1 month, then stop ASA.  Will continue Plavix + warfarin combination to finish a year.  However, if necessary to  stop for VATS after 1 month, this would option given use of BMS.  Continue statin.   CLEGG,AMY NP-C  08/20/2014 7:21 AM   Patient seen with NP, agree with the above note.  He is holding NSR today after DCCV.  From cardiac standpoint, he can go home today.  See above note for plan.  He will need close followup.  I have listed his followup needs and meds below.   D/C Meds  Bisoprolol 7.5 mg daily Lisinopril 10 mg twice a day Lasix 40 mg daily Aspirin 81 mg daily Clopidogrel 75 mg daily Amiodarone 400 mg twice a day for 1 week then 200 mg twice a day Coumadin 7.5 mg daily Lovenox 80 mg twice a day  Spironolactone 25 mg daily  Will set up follow up in HF clinic next week. Check BMET at that time.  Will need followup at coumadin clinic.  Will need followup with Dr Dorris Fetch for loculated effusion.   Marca Ancona 08/20/2014 7:46 AM

## 2014-08-20 NOTE — Progress Notes (Signed)
[place] TEAM 1 - Stepdown/ICU TEAM Progress Note  Vong Garringer ZOX:[id number] DOB: Mar 20, 1951 DOA: 08/13/2014 PCP: Rudi Heap, MD  Admit HPI / Brief Narrative: 64 y.o. WM PMHx tobacco abuse stating it has been "years" since he has been to see a physician was sent to the [facility] emergency department from [facility]. Patient recently was treated for community-acquired pneumonia with Levaquin. Was instructed at that time to establish with a primary care physician. Upon presentation to the PCP office he was found to have a rapid irregular heart rate and was sent directly to [facility] ER via EMS.   Upon arrival to the emergency department patient was found to be in atrial fibrillation with RVR and was bolused and subsequently started on a Cardizem infusion. Heart rate has decreased from 137 to 108 but he remained in atrial fibrillation. Chest x-ray revealed increased left perihilar opacity concerning for pneumonia or possibly edema. There was also a stable pleural-based opacity in the right lateral chest wall most consistent with loculated pleural effusion. Patient had mild leukocytosis and was given empiric antibiotics to cover for pneumonia. Patient stated he has had upper respiratory symptoms for 1-1/2 months. He initially took some leftover Zithromax from a friend. He was seen at the urgent care on the 29th and was started on antibiotics with some improvement in his symptoms. For several weeks he noticed increasing lower extremity edema, orthopnea and mild dyspnea on exertion. He has noticed the edema has worsened over the past 48 hours.    HPI/Subjective: 2/11 A/O 4, NAD, cough has resolved, negative SOB, negative CP  Assessment/Plan: Acute hypoxic respiratory failure - CAP + acute COPD exac + CHF exac  -Continue Zosyn and vancomycin complete 7 day course of antibiotics today; discharge in a.m. -Xopenex nebulizer QID PRN -Discontinue Solu-Medrol  -Flutter  valve  R pleural based loculated effusion  -Pulm following in consultation - TCTS has seen and plans an eventual VATS once all other issues stabilized (weeks away)  CAP - L pleural effusion  -Cont empiric abx tx - now s/p thoracentesis per PCCM - clinically stabilizing   Newly diagnosed severe systolic CHF - EF 20-25% -Cardiac catheterization see results below  -Daily weight; wgt at admit 90.7kg       -Strict in and out; -14.4 liters   Probable COPD - Tobacco abuse  -Cont usual tx regimen - no formal diagnosis previously as pt has not received prior medical care - no wheezing today - acute exacerbation resolved   Newly diagnosed Afib w RVR -On heparin gtt - CCB changed to amio given low EF - Cards following - TSH normal - rate currently well controlled   Mild hypokalemia -Potassium goal >4    Code Status: FULL Family Communication: no family present at time of exam Disposition Plan: Per cardiothoracic surgery/cardiology    Consultants: Dr Laurey Morale [facility])  Dr Coralyn Helling (PCCM) Dr.Steven C Hendrickson (TCTS)  Procedure/Significant Events: 2/4 TTE - EF 20-25% - diffuse hypokinesis - moderate RAE/RVE; mildly reduced RV function 2/4 LE venous dopplers - negative for DVT  2/8 - R and L heart cath - Successful angioplasty of the distal RCA reducing the 95% stenosis to 0%  Culture   Antibiotics: Azithro 2/4 > 2/5 Rocephin 2/4 > 2/5 Zosyn 2/4 > Vanc 2/4 >  DVT prophylaxis: IV heparin    Devices    LINES / TUBES:      Continuous Infusions: . sodium chloride 10 mL/hr at 08/18/14  1300  . sodium chloride    . sodium chloride    . sodium chloride      Objective: VITAL SIGNS: Temp: 97.4 F (36.3 C) (02/11 2007) Temp Source: Oral (02/11 2007) BP: 123/63 mmHg (02/11 2007) Pulse Rate: 55 (02/11 2007) SPO2; FIO2:   Intake/Output Summary (Last 24 hours) at 08/20/14 2149 Last data filed at 08/20/14 1630  Gross per 24 hour  Intake    1063 ml  Output   2725 ml  Net  -1662 ml     Exam: General:  A/O 4, NAD,No acute respiratory distress Lungs: left lung Clear to auscultation, RUL clear to auscultation, RML/RLL decreased breath sounds,without wheezes or crackles Cardiovascular: Regular rate and rhythm without murmur gallop or rub normal S1 and S2 Abdomen: Nontender, nondistended, soft, bowel sounds positive, no rebound, no ascites, no appreciable mass Extremities: No significant cyanosis, clubbing, or edema bilateral lower extremities  Data Reviewed: Basic Metabolic Panel:  Recent Labs Lab 08/16/14 0410 08/17/14 0405 08/18/14 0253 08/18/14 1339 08/19/14 0503 08/20/14 0543  NA 139 137 135  --  136 138  K 3.4* 4.0 3.3* 4.0 4.0 3.7  CL 92* 91* 94*  --  97 102  CO2 36* 37* 27  --  26 28  GLUCOSE 127* 115* 104*  --  154* 278*  BUN --  14 13  CREATININE 1.01 1.06 1.07  --  0.95 0.84  CALCIUM 8.2* 8.5 8.3*  --  8.6 8.4  MG  --  2.2  --  2.0 2.1  --    Liver Function Tests:  Recent Labs Lab 08/14/14 1906 08/15/14 0256  AST  --  34  ALT  --  41  ALKPHOS  --  73  BILITOT  --  0.8  PROT 6.7 6.7  ALBUMIN  --  2.3*   No results for input(s): LIPASE, AMYLASE in the last 168 hours. No results for input(s): AMMONIA in the last 168 hours. CBC:  Recent Labs Lab 08/16/14 0410 08/17/14 0405 08/18/14 0253 08/19/14 0503 08/20/14 0543  WBC 20.1* 16.7* 10.9* 12.0* 12.3*  HGB 14.6 15.1 14.6 16.2 15.0  HCT 46.0 48.3 46.2 50.2 47.7  MCV 97.9 100.4* 97.7 96.2 98.6  PLT 254 261 215 221 226   Cardiac Enzymes: No results for input(s): CKTOTAL, CKMB, CKMBINDEX, TROPONINI in the last 168 hours. BNP (last 3 results)  Recent Labs  08/13/14 1755  BNP 1223.4*    ProBNP (last 3 results) No results for input(s): PROBNP in the last 8760 hours.  CBG: No results for input(s): GLUCAP in the last 168 hours.  Recent Results (from the past 240 hour(s))  Blood Culture (routine x 2)     Status: None    Collection Time: 08/13/14 12:55 PM  Result Value Ref Range Status   Specimen Description BLOOD FOREARM RIGHT  Final   Special Requests BOTTLES DRAWN AEROBIC AND ANAEROBIC 5CC  Final   Culture   Final    NO GROWTH 5 DAYS Performed at [facility]    Report Status 08/20/2014 FINAL  Final  Blood Culture (routine x 2)     Status: None   Collection Time: 08/13/14  1:00 PM  Result Value Ref Range Status   Specimen Description BLOOD LEFT ANTECUBITAL  Final   Special Requests BOTTLES DRAWN AEROBIC AND ANAEROBIC  Final   Culture   Final    NO GROWTH 5 DAYS Performed at [facility]    Report  Status 08/19/2014 FINAL  Final  Urine culture     Status: None   Collection Time: 08/13/14  1:30 PM  Result Value Ref Range Status   Specimen Description URINE, CLEAN CATCH  Final   Special Requests NONE  Final   Colony Count NO GROWTH Performed at [facility]   Final   Culture NO GROWTH Performed at [facility]   Final   Report Status 08/14/2014 FINAL  Final  MRSA PCR Screening     Status: None   Collection Time: 08/13/14  7:18 PM  Result Value Ref Range Status   MRSA by PCR NEGATIVE NEGATIVE Final    Comment:        The GeneXpert MRSA Assay (FDA approved for NASAL specimens only), is one component of a comprehensive MRSA colonization surveillance program. It is not intended to diagnose MRSA infection nor to guide or monitor treatment for MRSA infections.   Culture, sputum-assessment     Status: None   Collection Time: 08/13/14 11:18 PM  Result Value Ref Range Status   Specimen Description SPUTUM  Final   Special Requests NONE  Final   Sputum evaluation   Final    MICROSCOPIC FINDINGS SUGGEST THAT THIS SPECIMEN IS NOT REPRESENTATIVE OF LOWER RESPIRATORY SECRETIONS. PLEASE RECOLLECT. RESULT CALLED TO, READ BACK BY AND VERIFIED WITH: A. WEATHERFORD RN [phone] GREEN R    Report Status 08/14/2014 FINAL  Final  AFB culture with smear      Status: None (Preliminary result)   Collection Time: 08/14/14  4:25 PM  Result Value Ref Range Status   Specimen Description PLEURAL FLUID LEFT  Final   Special Requests NONE  Final   Acid Fast Smear   Final    NO ACID FAST BACILLI SEEN Performed at [facility]    Culture   Final    CULTURE WILL BE EXAMINED FOR 6 WEEKS BEFORE ISSUING A FINAL REPORT Performed at [facility]    Report Status PENDING  Incomplete  Body fluid culture     Status: None   Collection Time: 08/14/14  4:26 PM  Result Value Ref Range Status   Specimen Description PLEURAL FLUID LEFT  Final   Special Requests NONE  Final   Gram Stain   Final    RARE WBC PRESENT,BOTH PMN AND MONONUCLEAR NO ORGANISMS SEEN Performed at [facility]    Culture   Final    NO GROWTH 3 DAYS Performed at [facility]    Report Status 08/18/2014 FINAL  Final  Fungus Culture with Smear     Status: None (Preliminary result)   Collection Time: 08/14/14  4:26 PM  Result Value Ref Range Status   Specimen Description FLUID PLEURAL LEFT  Final   Special Requests NONE  Final   Fungal Smear   Final    NO YEAST OR FUNGAL ELEMENTS SEEN Performed at [facility]    Culture   Final    CULTURE IN PROGRESS FOR FOUR WEEKS Performed at [facility]    Report Status PENDING  Incomplete     Studies:  Recent x-ray studies have been reviewed in detail by the Attending Physician  Scheduled Meds:  Scheduled Meds: . amiodarone  400 mg Oral BID  . aspirin  81 mg Oral Daily  . atorvastatin  40 mg Oral q1800  . bisoprolol  7.5 mg Oral Daily  . clopidogrel  75 mg Oral Q breakfast  . enoxaparin (LOVENOX) injection  80 mg Subcutaneous Q12H  . furosemide  40 mg Oral Daily  . lisinopril  10 mg Oral BID  . nicotine  21 mg Transdermal Daily  . piperacillin-tazobactam (ZOSYN)  IV  3.375 g Intravenous Q8H  . sodium chloride  3 mL Intravenous Q12H  . spironolactone  25 mg Oral Daily  . vancomycin   1,000 mg Intravenous Q8H  . Warfarin - Pharmacist Dosing Inpatient   Does not apply q1800    Time spent on care of this patient: 40 mins   Drema DallasWOODS, CURTIS, J , [facility]  [facility] Office  [phone] Pager - [phone]  On-Call/Text Page:      [email or web address]      password [email or web address]  If 7PM-7AM, please contact night-coverage [email or web address] Password [email or web address] 08/20/2014, 9:49 PM   LOS: 7 days   Care during the described time interval was provided by me .  I have reviewed this patient's available data, including medical history, events of note, physical examination, radiology studies and test results as part of my evaluation  Carolyne Littlesurtis Woods, MD [phone] Pager

## 2014-08-20 NOTE — Progress Notes (Signed)
[id number] Cardiac Rehab Completed CHF, Atrial fib and stent education with pt. I gave him CHF packet and stent information. We discussed daily weights,sodium and fluid restrictions,when to call MD and 911. He voices understanding. Pt lives a simple life and admits to almost never going to a doctor. This has been overwhelming to him.He declines Outpt. CRP, not interested. We discussed smoking cessation. I gave pt tips for quitting, coaching contact number and information on quit smart classes. Pt states that he has quit before and knows that he needs to, He wants to quit "cold [place]." I placed CHF video for pt to watch. His nurse has ordered him the Off the beat booklet. I will also place atrial fib video on for him. Beatrix FettersHughes, [place] Pam G, RN 08/20/2014 11:01 AM

## 2014-08-20 NOTE — Progress Notes (Signed)
ANTICOAGULATION CONSULT NOTE   Pharmacy Consult for Heparin/coumadin>>lovenox/coumadin Indication: atrial fibrillation  No Known Allergies  Patient Measurements: Height: 5\' 9"  (175.3 cm) Weight: 182 lb 8.7 oz (82.8 kg) IBW/kg (Calculated) : 70.7   Vital Signs: Temp: 97.4 F (36.3 C) (02/11 0512) Temp Source: Oral (02/11 0512) BP: 115/56 mmHg (02/11 0512) Pulse Rate: 58 (02/11 0512)  Labs:  Recent Labs  08/18/14 0253 08/18/14 1042 08/19/14 0503 08/20/14 0543  HGB 14.6  --  16.2 15.0  HCT 46.2  --  50.2 47.7  PLT 215  --  221 226  LABPROT 14.5  --  14.0 15.4*  INR 1.11  --  1.06 1.21  HEPARINUNFRC  --  0.40 0.51 0.78*  CREATININE 1.07  --  0.95 0.84    Estimated Creatinine Clearance: 92.3 mL/min (by C-G formula based on Cr of 0.84).   Medical History: Past Medical History  Diagnosis Date  . Atrial fibrillation with RVR 08/13/2014    new onset/notes 08/13/2014  . CAP (community acquired pneumonia) 07/2014  . Acute CHF     [name]/notes 08/13/2014  . Migraine 1970     "don't know why; they just left and haven't come back"   Assessment: 64 yo male with afib continues on heparin>warfarin bridge.   Heparin level above goal this morning at 0.78. No bleeding or IV issues noted. CBC normal. INR starting to trend up this morning at 1.2. Successful dccv yesterday. New orders received this morning to transition to   Plan to continue asa/plavix/warfarin x 1 then plavix/warfarin x 1 year.  CHADS2VASC- 1 (CHF)  Infectious Disease:no fevers, wbc 12, CAP was recently rx'ed levaquin x 1wk, plan to continue abx for now. Will plan on vancomycin level 2/12 if IV abx to continue.   2/6 VT 18.5  2/4 vanc>> 2/4 zoysn>> 2/4 bld x 2 - ng 2/5 pleural fluid - ng  Goal of Therapy:  Vancomycin trough 15-20 INR 2-3 Monitor platelets by anticoagulation protocol: Yes   Plan:  - Change to lovenox 80mg  q12 hours - start this morning - coumadin 7.5mg  po daily at d/c  - follow up in coumadin  clinic on monday - Continue vancomycin 1g IV q8 hours, will consider recheck trough tomorrow if continues - Continue zosyn 3.375g IV q8 hours  Sheppard CoilFrank Jensen Cheramie PharmD., BCPS Clinical Pharmacist Pager [phone] 08/20/2014 7:10 AM

## 2014-08-20 NOTE — Progress Notes (Signed)
Utilization review completed.  

## 2014-08-21 DIAGNOSIS — E876 Hypokalemia: Secondary | ICD-10-CM

## 2014-08-21 DIAGNOSIS — J441 Chronic obstructive pulmonary disease with (acute) exacerbation: Secondary | ICD-10-CM

## 2014-08-21 LAB — CBC
HCT: 46.3 % (ref 39.0–52.0)
Hemoglobin: 14.5 g/dL (ref 13.0–17.0)
MCH: 30.6 pg (ref 26.0–34.0)
MCHC: 31.3 g/dL (ref 30.0–36.0)
MCV: 97.7 fL (ref 78.0–100.0)
PLATELETS: 194 10*3/uL (ref 150–400)
RBC: 4.74 MIL/uL (ref 4.22–5.81)
RDW: 13 % (ref 11.5–15.5)
WBC: 14.5 10*3/uL — ABNORMAL HIGH (ref 4.0–10.5)

## 2014-08-21 LAB — PROTIME-INR
INR: 1.85 — ABNORMAL HIGH (ref 0.00–1.49)
Prothrombin Time: 21.5 seconds — ABNORMAL HIGH (ref 11.6–15.2)

## 2014-08-21 LAB — HEMOGLOBIN A1C
Hgb A1c MFr Bld: 6.7 % — ABNORMAL HIGH (ref 4.8–5.6)
Mean Plasma Glucose: 146 mg/dL

## 2014-08-21 MED ORDER — ENOXAPARIN SODIUM 80 MG/0.8ML ~~LOC~~ SOLN: 80.0000 mg | Freq: Two times a day (BID) | SUBCUTANEOUS | Status: DC

## 2014-08-21 MED ORDER — ATORVASTATIN CALCIUM 40 MG PO TABS: 40.0000 mg | ORAL_TABLET | Freq: Every day | ORAL | Status: DC

## 2014-08-21 MED ORDER — LISINOPRIL 10 MG PO TABS: 10.0000 mg | ORAL_TABLET | Freq: Two times a day (BID) | ORAL | Status: DC

## 2014-08-21 MED ORDER — AMIODARONE HCL 400 MG PO TABS: 400.0000 mg | ORAL_TABLET | Freq: Two times a day (BID) | ORAL | Status: DC

## 2014-08-21 MED ORDER — WARFARIN SODIUM 5 MG PO TABS
5.0000 mg | ORAL_TABLET | Freq: Every day | ORAL | Status: DC
  Filled 2014-08-21: qty 1

## 2014-08-21 MED ORDER — FUROSEMIDE 40 MG PO TABS: 40.0000 mg | ORAL_TABLET | Freq: Every day | ORAL | Status: DC

## 2014-08-21 MED ORDER — ACETAMINOPHEN 325 MG PO TABS: 650.0000 mg | ORAL_TABLET | ORAL | Status: DC | PRN

## 2014-08-21 MED ORDER — WARFARIN SODIUM 5 MG PO TABS: 5.0000 mg | ORAL_TABLET | Freq: Every day | ORAL | Status: DC

## 2014-08-21 MED ORDER — BISOPROLOL FUMARATE 5 MG PO TABS: 7.5000 mg | ORAL_TABLET | Freq: Every day | ORAL | Status: DC

## 2014-08-21 MED ORDER — ASPIRIN 81 MG PO CHEW: 81.0000 mg | CHEWABLE_TABLET | Freq: Every day | ORAL | Status: DC

## 2014-08-21 MED ORDER — SPIRONOLACTONE 25 MG PO TABS: 25.0000 mg | ORAL_TABLET | Freq: Every day | ORAL | Status: DC

## 2014-08-21 MED ORDER — CLOPIDOGREL BISULFATE 75 MG PO TABS: 75.0000 mg | ORAL_TABLET | Freq: Every day | ORAL | Status: DC

## 2014-08-21 MED ORDER — NICOTINE 21 MG/24HR TD PT24: 21.0000 mg | MEDICATED_PATCH | Freq: Every day | TRANSDERMAL | Status: DC

## 2014-08-21 NOTE — Progress Notes (Signed)
Patient ID: Jason Spencer, male   DOB: 03-25-1951, 64 y.o.   MRN: [id number]     SUBJECTIVE: Doing well.  Denies chest pain, says breathing continues to improve.  He had RHC/LHC yesterday followed by PCI to RCA.     Has had left-sided thoracentesis, suspect parapneumonic effusion.  Has loculated right-sided effusion, evaluated by Dr Dorris Fetch, plan for VATS after antibiotic treatment of CAP in a few weeks.   2/10 had successful cardioversion.  Off amiodarone drip and on po. Maintaining NSR.  Denies SOB.   Echo with EF 20-25%, moderately dilated RV with mildly decreased systolic function.   RHC/LHC (2/8) RA 11 RV 41/8 PA 43/20, mean 27 PCWP mean 16 LV 96/7 AO 104/64 Oxygen saturations: PA 57% AO 91% Cardiac Output (Fick) 3.75  Cardiac Index (Fick) 1.9 95% mid-distal RCA, s/p BMS to mid-distal RCA  Scheduled Meds: . amiodarone  400 mg Oral BID  . aspirin  81 mg Oral Daily  . atorvastatin  40 mg Oral q1800  . bisoprolol  7.5 mg Oral Daily  . clopidogrel  75 mg Oral Q breakfast  . enoxaparin (LOVENOX) injection  80 mg Subcutaneous Q12H  . furosemide  40 mg Oral Daily  . lisinopril  10 mg Oral BID  . nicotine  21 mg Transdermal Daily  . piperacillin-tazobactam (ZOSYN)  IV  3.375 g Intravenous Q8H  . sodium chloride  3 mL Intravenous Q12H  . spironolactone  25 mg Oral Daily  . vancomycin  1,000 mg Intravenous Q8H  . Warfarin - Pharmacist Dosing Inpatient   Does not apply q1800   Continuous Infusions: . sodium chloride 10 mL/hr at 08/18/14 1300  . sodium chloride    . sodium chloride    . sodium chloride     PRN Meds:.acetaminophen, levalbuterol, ondansetron (ZOFRAN) IV, oxyCODONE-acetaminophen, sodium chloride    Filed Vitals:   08/20/14 0931 08/20/14 1331 08/20/14 2007 08/21/14 0400  BP: 130/69 123/63 123/63 120/74  Pulse: 59 50 55 57  Temp:  97.6 F (36.4 C) 97.4 F (36.3 C) 97.8 F (36.6 C)  TempSrc:  Oral Oral Oral  Resp:  18 19 18   Height:        Weight:    183 lb 8 oz (83.235 kg)  SpO2:  98% 96% 98%    Intake/Output Summary (Last 24 hours) at 08/21/14 0742 Last data filed at 08/21/14 0706  Gross per 24 hour  Intake   1563 ml  Output   2275 ml  Net   -712 ml    LABS: Basic Metabolic Panel:  Recent Labs  95/62/13 1339 08/19/14 0503 08/20/14 0543  NA  --  136 138  K 4.0 4.0 3.7  CL  --  97 102  CO2  --  26 28  GLUCOSE  --  154* 278*  BUN  --  14 13  CREATININE  --  0.95 0.84  CALCIUM  --  8.6 8.4  MG 2.0 2.1  --    Liver Function Tests: No results for input(s): AST, ALT, ALKPHOS, BILITOT, PROT, ALBUMIN in the last 72 hours. No results for input(s): LIPASE, AMYLASE in the last 72 hours. CBC:  Recent Labs  08/20/14 0543 08/21/14 0358  WBC 12.3* 14.5*  HGB 15.0 14.5  HCT 47.7 46.3  MCV 98.6 97.7  PLT 226 194   Cardiac Enzymes: No results for input(s): CKTOTAL, CKMB, CKMBINDEX, TROPONINI in the last 72 hours. BNP: Invalid input(s): POCBNP D-Dimer: No results for input(s): DDIMER in the  last 72 hours. Hemoglobin A1C: No results for input(s): HGBA1C in the last 72 hours. Fasting Lipid Panel: No results for input(s): CHOL, HDL, LDLCALC, TRIG, CHOLHDL, LDLDIRECT in the last 72 hours. Thyroid Function Tests: No results for input(s): TSH, T4TOTAL, T3FREE, THYROIDAB in the last 72 hours.  Invalid input(s): FREET3 Anemia Panel: No results for input(s): VITAMINB12, FOLATE, FERRITIN, TIBC, IRON, RETICCTPCT in the last 72 hours.  RADIOLOGY: Dg Chest 2 View  08/19/2014   CLINICAL DATA:  Pneumonia  EXAM: CHEST  2 VIEW  COMPARISON:  08/16/2014  FINDINGS: Mild cardiomegaly. Loculated pleural effusion in the right lateral upper hemi thorax is stable. Normal vascularity. Patchy opacity in the left upper lung zone laterally unchanged. No pneumothorax.  IMPRESSION: Cardiomegaly without decompensation  Stable loculated right pleural effusion.  Mild patchy density in the left upper lobe unchanged.   Electronically  Signed   By: Jolaine Click M.D.   On: 08/19/2014 08:07   Ct Chest W Contrast  08/13/2014   CLINICAL DATA:  Diagnosed with pneumonia 6 days ago, shortness of breath, cough, weakness, smoker  EXAM: CT CHEST WITH CONTRAST  TECHNIQUE: Multidetector CT imaging of the chest was performed during intravenous contrast administration. Sagittal and coronal MPR images reconstructed from axial data set.  CONTRAST:  OMNIPAQUE IOHEXOL 300 MG/ML  SOLN IV  COMPARISON:  Chest radiograph 08/13/2014  FINDINGS: Scattered atherosclerotic calcifications aorta and coronary arteries.  Aortic normal caliber.  Pulmonary arteries well opacified and grossly patent on nondedicated exam.  Scattered normal size thoracic lymph nodes.  Cardiac chambers appear enlarged.  Pleural calcifications posterior medial RIGHT hemi thorax.  Visualized upper abdomen unremarkable.  Loculated RIGHT pleural effusion.  Dependent LEFT pleural effusion.  Minimal scattered atelectasis in RIGHT lung with more significant compressive atelectasis in LEFT lower lobe.  Infiltrates identified in LEFT upper and LEFT lower lobes, particularly peripherally in the LEFT upper lobe where focal density is seen which could represent rounded atelectasis or pneumonia.  Emphysematous changes at apices and scattered peribronchial thickening.  No pneumothorax or acute osseous findings.  IMPRESSION: Emphysematous and bronchitic changes question COPD.  BILATERAL pleural effusions, loculated on RIGHT, with scattered infiltrates in LEFT upper and LEFT lower lobes.  Followup until resolution recommended to exclude underlying abnormalities particularly in the periphery of the LEFT upper lobe.  Nonspecific pleural calcifications in the RIGHT hemi thorax, could be related to prior hemothorax, empyema, less likely TB or asbestos exposure due to unilateral involvement.   Electronically Signed   By: Ulyses Southward M.D.   On: 08/13/2014 17:30   Dg Chest Port 1 View  08/16/2014   CLINICAL DATA:   Pneumonia  EXAM: PORTABLE CHEST - 1 VIEW  COMPARISON:  08/14/2014  FINDINGS: Mild patchy left upper lobe opacity, unchanged.  Stable pleural based mass in the lateral right hemithorax, corresponding to loculated pleural effusion on CT.  No pneumothorax.  Cardiomegaly.  IMPRESSION: Mild patchy left upper lobe opacity, suspicious for pneumonia, unchanged.  Stable pleural-based mass in the lateral right hemithorax, corresponding to loculated pleural effusion on CT.   Electronically Signed   By: Charline Bills M.D.   On: 08/16/2014 09:05   Dg Chest Port 1 View  08/14/2014   CLINICAL DATA:  Shortness of breath, status post thoracentesis  EXAM: PORTABLE CHEST - 1 VIEW  COMPARISON:  CT chest dated 08/13/2014  FINDINGS: Pleural-based opacity along the lateral right hemithorax, corresponding to a loculated pleural effusion on CT.  Patchy opacity in the left upper lobe,  suspicious for pneumonia, although improving when compared to prior chest radiographs.  No pneumothorax status post thoracentesis.  Cardiomegaly.  IMPRESSION: No pneumothorax status post thoracentesis.  Otherwise unchanged.   Electronically Signed   By: Charline BillsSriyesh  Krishnan M.D.   On: 08/14/2014 19:15   Dg Chest Port 1 View  08/13/2014   CLINICAL DATA:  Atrial fibrillation.  EXAM: PORTABLE CHEST - 1 VIEW  COMPARISON:  August 07, 2014.  FINDINGS: Stable cardiomediastinal silhouette. No pneumothorax is noted. Stable pleural opacity is seen along right lateral chest wall most consistent with loculated effusion, but mass cannot be excluded. Fluid is also noted in the right minor fissure. Increased left perihilar opacity is noted concerning for pneumonia or possibly edema. Bony thorax is intact.  IMPRESSION: Increased left perihilar opacity is noted concerning for pneumonia or possibly edema.  Stable pleural-based opacity is noted along right lateral chest wall most consistent with loculated pleural effusion, with probable loculated fluid within the right  minor fissure is well. However, CT scan of the chest is recommended to rule out pleural-based mass.   Electronically Signed   By: Roque LiasJames  Green M.D.   On: 08/13/2014 14:00    PHYSICAL EXAM General: NAD Neck: JVP 5-6 cm cm, no thyromegaly or thyroid nodule.  Lungs: Clear  decreased breath sounds right base.  CV: Lateral PMI.  Heart Regular S1/S2, no S3/S4, no murmur.  No lower extremity edema.    Abdomen: Soft, nontender, no hepatosplenomegaly, no distention.  Neurologic: Alert and oriented x 3.  Psych: Normal affect. Extremities: No clubbing or cyanosis.   TELEMETRY: NSR 60s ASSESSMENT AND PLAN: 64 yo with history of smoking presented with dyspnea and was found to be in atrial fibrillation with RVR and PNA + effusions on CXR.  He was clinically in CHF as well.  1. Atrial fibrillation: With RVR at admission.  Not sure how long he has been in atrial fibrillation. 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 Had successful DC-CV. Maintaining NSR. Continue amiodarone 400 mg twice a day for 1 week then transition to 200 mg twice a day.    - Will use bisoprolol as well (beta-1 selective given concern for COPD).  - He had BMS placed 08/17/14. Will use warfarin rather than NOAC.  Will need to bridge with lovenox. INR 1.8 today. Will [facility]. Plan to check INR Monday 2. Acute systolic CHF: EF 13-08%20-25% with mildly decreased RV systolic function.  Volume status improved. Continue Lasix 40 mg daily. 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  Has CAD but does not explain the extent of his LV dysfunction. -  Continue bisoprolol, lisinopril, and spironolactone at current doses.   3. ID: Suspect CAP with parapneumonic effusion on left.  Currently on vanco/Zosyn.  He also has a right loculated effusion.  Plan per Dr Dorris FetchHendrickson to treat with abx then in a few weeks (would have to be at least 4 wks post-DCCV and also BMS), plan VATS on  right.  He is afebrile.  WBCs high but coming down after stopping Solumedrol. He will need followup with Dr Dorris FetchHendrickson. 4. Smoking: Needs to quit.  Possible COPD. Steroids stopped. 5. CAD: Had BMS to RCA 08/17/13. Will need ASA 81 + Plavix + warfarin x 1 month, then stop ASA.  Will continue Plavix + warfarin combination to finish a year.  However, if necessary to stop for VATS after 1 month, this would option given use of BMS.  Continue statin.  Discharge Plan:  D/C Meds  Bisoprolol 7.5 mg daily Lisinopril 10 mg twice a day Lasix 40 mg daily Aspirin 81 mg daily x 1 month Clopidogrel 75 mg daily Amiodarone 400 mg twice a day for 1 week then 200 mg twice a day Coumadin 7.5 mg daily Lovenox 80 mg twice a day  Spironolactone 25 mg daily  Will set up follow up in HF clinic next week. Check BMET at that time.  Will need followup at coumadin clinic.  Will need followup with Dr Dorris Fetch for loculated effusion.   Hopefully he can go home today.   Marca Ancona 08/21/2014 7:42 AM

## 2014-08-21 NOTE — Progress Notes (Signed)
08/21/2014 12:47 PM Discharge AVS meds taken today and those due this evening reviewed.  Lovenox teaching completed with return demonstration. Pt. Stated he gave his mother injections.  Follow-up appointments and when to call md reviewed.  D/C IV and TELE.  Questions and concerns addressed.   D/C home per orders.

## 2014-08-21 NOTE — Discharge Summary (Addendum)
Physician Discharge Summary  Cayne Yom WRU:[id number] DOB: 1951/04/24 DOA: 08/13/2014  PCP: Rudi Heap, MD  Admit date: 08/13/2014 Discharge date: 08/21/2014  Time spent: 45 minutes  Recommendations for Outpatient Follow-up:  Patient will be discharged home. He will need to follow-up with the heart failure clinic within 1 week of discharge. Patient will have a repeat BMP at that time. Patient will also need follow-up with Coumadin clinic.  Patient will need follow-up with Dr. Dorris Fetch for the loculated effusion. Patient to continue his medications as prescribed. Patient should follow a heart healthy diet.  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 Junious Silk, NP with Dr. Susa Raring Randle Shatzer is a 64 y.o. male, with no significant past medical history except for tobacco abuse stating it has been "years" since he has been to see a physician for complete physical. He was sent to the [facility] emergency department from [facility] family practice. Patient recently seen at an urgent care and was treated for community-acquired pneumonia with Levaquin. Was instructed at that time to establish with a primary care physician. Upon presentation today to the PCP office he was found to have a rapid irregular heart rate and was sent directly to [facility] ER via EMS. Upon arrival to the emergency department  patient was found to be in atrial fibrillation with RVR and was bolused and subsequently started on a Cardizem infusion. Heart rate has decreased from 137 to 108 but he remains in atrial fibrillation. Chest x-ray completed in the ER revealed increased left perihilar opacity concerning for pneumonia or possibly edema. There was also a stable pleural-based opacity in the right lateral chest wall most consistent with loculated pleural effusion with CT of the chest recommended to rule out pleural-based mass. Patient had mild leukocytosis and was given empiric antibiotics to cover for pneumonia. In further questioning of this patient he has had upper respiratory symptoms for at least 1 month to 1-1/2 months that he presumed was bronchitis. He initially took some leftover Zithromax from a friend. He was seen at the urgent care on the 29th and was started on antibiotics with some improvement in his symptoms. Unfortunately for the past several weeks, possibly longer, he has noticed some increasing lower extremity edema, orthopnea and mild dyspnea on exertion. He has noticed the edema has worsened over the past 48 hours. When his symptoms initially began he had a productive cough of yellow to brown sputum. And also had subjective fevers and nocturnal diaphoresis which resolved in less than 24 hours and has not recurred. He has had no awareness of any palpitations.  Hospital Course:  Acute hypoxic respiratory failure - CAP + acute COPD exac + CHF exac  -Completed Zosyn and vancomycin  7 day course -Xopenex nebulizer QID PRN -Discontinued Solu-Medrol  -Flutter valve and incentive spirometry  R pleural based loculated effusion  -Pulmology following in consultation  -Cardiothoracic surgery has seen and plans an eventual VATS once all other issues stabilized (weeks away)  CAP - L  pleural effusion  -Completed antibiotic source -s/p thoracentesis per PCCM -Stable  Coronary artery disease -Patient had stent  placed to RCA 08/17/2014 -Continue aspirin 81mg  daily, plavix 75mg  daily, warfarin for one month (stop ASA thereafter) -Patient will likely need to continue with Plavix and Coumadin for 1 year -Continue beta blocker, ACE inhibitor, statin  Newly diagnosed severe systolic CHF - EF 20-25% -Cardiac catheterization see results below  -Monitored daily weights and intake/output -Cardiology consulted and appreciated; recommened bisoprolol 7.5 mg a day, lisinopril 10 mg twice a day, Lasix 40 mg daily, Aldactone 25 mg daily -Patient will need to follow-up with CHF clinic in one week as well as have repeat BMP at that time  Newly diagnosed Afib w RVR -Initially placed on heparin drip however transitioned to Lovenox as well as Coumadin -Patient had DCCV, currently in sinus rhythm -Patient was initially placed on amiodarone and will continue 400 mg twice daily for 1 week, 200 mg twice daily thereafter -TSH was within normal range  Probable COPD - Tobacco abuse  -Continue regimen  -no formal diagnosis previously as pt has not received prior medical care  -no wheezing today  -acute exacerbation resolved  -Patient counseled against tobacco use  Mild hypokalemia -Potassium goal >4   Procedures: TTE: EF 20-25%, diffuse hypokinesis, moderate RAE/RVE, mildly reduced RV function Lower extremity Doppler negative for DVT Right and left heart cath, successful angioplasty to the distal RCA DCCV  Consultations: Cardiology Pulmonology Cardiothoracic surgery   Discharge Exam: Filed Vitals:   08/21/14 0827  BP: 122/66  Pulse: 52  Temp:   Resp:      General: Well developed, well nourished, NAD, appears stated age  HEENT: NCAT, mucous membranes moist.  Cardiovascular: S1 S2 auscultated, Regular rate and rhythm.  Respiratory: Decreased breath sounds right base, otherwise clear to auscultation   Abdomen: Soft, nontender, nondistended, + bowel sounds  Extremities: warm dry without cyanosis  clubbing or edema  Neuro: AAOx3,nonfocal   Psych: Normal affect and demeanor with intact judgement and insight  Discharge Instructions      Discharge Instructions    Discharge instructions    Complete by:  As directed   Patient will be discharged home. He will need to follow-up with the heart failure clinic within 1 week of discharge. Patient will have a repeat BMP at that time. Patient will also need follow-up with Coumadin clinic.  Patient will need follow-up with Dr. Dorris FetchHendrickson, pulmonologist for the loculated effusion. Patient to continue his medications as prescribed. Patient should follow a heart healthy diet.     Increase activity slowly    Complete by:  As directed             Medication List    STOP taking these medications        ibuprofen 200 MG tablet  Commonly known as:  ADVIL,MOTRIN     levofloxacin 500 MG tablet  Commonly known as:  LEVAQUIN      TAKE these medications        acetaminophen 325 MG tablet  Commonly known as:  TYLENOL  Take 2 tablets (650 mg total) by mouth every 4 (four) hours as needed for headache or mild pain.     amiodarone 400 MG tablet  Commonly known as:  PACERONE  Take 1 tablet (400 mg total) by mouth 2 (two) times daily.     aspirin 81 MG chewable tablet  Chew 1 tablet (81 mg total) by mouth daily.     atorvastatin 40  MG tablet  Commonly known as:  LIPITOR  Take 1 tablet (40 mg total) by mouth daily at 6 PM.     bisoprolol 5 MG tablet  Commonly known as:  ZEBETA  Take 1.5 tablets (7.5 mg total) by mouth daily.     clopidogrel 75 MG tablet  Commonly known as:  PLAVIX  Take 1 tablet (75 mg total) by mouth daily with breakfast.     enoxaparin 80 MG/0.8ML injection  Commonly known as:  LOVENOX  Inject 0.8 mLs (80 mg total) into the skin every 12 (twelve) hours.     furosemide 40 MG tablet  Commonly known as:  LASIX  Take 1 tablet (40 mg total) by mouth daily.     ICY HOT EX  Apply 1 application topically daily as  needed (knee pain).     lisinopril 10 MG tablet  Commonly known as:  PRINIVIL,ZESTRIL  Take 1 tablet (10 mg total) by mouth 2 (two) times daily.     nicotine 21 mg/24hr patch  Commonly known as:  NICODERM CQ - dosed in mg/24 hours  Place 1 patch (21 mg total) onto the skin daily.     PROAIR HFA IN  Inhale 2 puffs into the lungs 4 (four) times daily.     spironolactone 25 MG tablet  Commonly known as:  ALDACTONE  Take 1 tablet (25 mg total) by mouth daily.     VISINE OP  Apply 2 drops to eye daily as needed (red eyes).     warfarin 5 MG tablet  Commonly known as:  COUMADIN  Take 1 tablet (5 mg total) by mouth daily at 6 PM.       No Known Allergies Follow-up Information    Follow up with Marca Ancona, MD On 08/27/2014.   Specialty:  Cardiology   Why:  at 12:00 Garage Code [id number]   Contact information:   [address]  [address] [place] [address] [phone]       Follow up with [facility] On 08/24/2014.   Specialty:  Cardiology   Why:  at 10:30    Contact information:   [address], [address] [place] [address] [phone]      Follow up with Rudi Heap, MD. Schedule an appointment as soon as possible for a visit in 1 week.   Specialty:  Family Medicine   Why:  Hospital followup   Contact information:   [address] [place] [address] [phone]        The results of significant diagnostics from this hospitalization (including imaging, microbiology, ancillary and laboratory) are listed below for reference.    Significant Diagnostic Studies: Dg Chest 2 View  08/19/2014   CLINICAL DATA:  Pneumonia  EXAM: CHEST  2 VIEW  COMPARISON:  08/16/2014  FINDINGS: Mild cardiomegaly. Loculated pleural effusion in the right lateral upper hemi thorax is stable. Normal vascularity. Patchy opacity in the left upper lung zone laterally unchanged. No pneumothorax.  IMPRESSION: Cardiomegaly without decompensation   Stable loculated right pleural effusion.  Mild patchy density in the left upper lobe unchanged.   Electronically Signed   By: Jolaine Click M.D.   On: 08/19/2014 08:07   Ct Chest W Contrast  08/13/2014   CLINICAL DATA:  Diagnosed with pneumonia 6 days ago, shortness of breath, cough, weakness, smoker  EXAM: CT CHEST WITH CONTRAST  TECHNIQUE: Multidetector CT imaging of the chest was performed during intravenous contrast administration.  Sagittal and coronal MPR images reconstructed from axial data set.  CONTRAST:  OMNIPAQUE IOHEXOL 300 MG/ML  SOLN IV  COMPARISON:  Chest radiograph 08/13/2014  FINDINGS: Scattered atherosclerotic calcifications aorta and coronary arteries.  Aortic normal caliber.  Pulmonary arteries well opacified and grossly patent on nondedicated exam.  Scattered normal size thoracic lymph nodes.  Cardiac chambers appear enlarged.  Pleural calcifications posterior medial RIGHT hemi thorax.  Visualized upper abdomen unremarkable.  Loculated RIGHT pleural effusion.  Dependent LEFT pleural effusion.  Minimal scattered atelectasis in RIGHT lung with more significant compressive atelectasis in LEFT lower lobe.  Infiltrates identified in LEFT upper and LEFT lower lobes, particularly peripherally in the LEFT upper lobe where focal density is seen which could represent rounded atelectasis or pneumonia.  Emphysematous changes at apices and scattered peribronchial thickening.  No pneumothorax or acute osseous findings.  IMPRESSION: Emphysematous and bronchitic changes question COPD.  BILATERAL pleural effusions, loculated on RIGHT, with scattered infiltrates in LEFT upper and LEFT lower lobes.  Followup until resolution recommended to exclude underlying abnormalities particularly in the periphery of the LEFT upper lobe.  Nonspecific pleural calcifications in the RIGHT hemi thorax, could be related to prior hemothorax, empyema, less likely TB or asbestos exposure due to unilateral involvement.    Electronically Signed   By: Ulyses Southward M.D.   On: 08/13/2014 17:30   Dg Chest Port 1 View  08/16/2014   CLINICAL DATA:  Pneumonia  EXAM: PORTABLE CHEST - 1 VIEW  COMPARISON:  08/14/2014  FINDINGS: Mild patchy left upper lobe opacity, unchanged.  Stable pleural based mass in the lateral right hemithorax, corresponding to loculated pleural effusion on CT.  No pneumothorax.  Cardiomegaly.  IMPRESSION: Mild patchy left upper lobe opacity, suspicious for pneumonia, unchanged.  Stable pleural-based mass in the lateral right hemithorax, corresponding to loculated pleural effusion on CT.   Electronically Signed   By: Charline Bills M.D.   On: 08/16/2014 09:05   Dg Chest Port 1 View  08/14/2014   CLINICAL DATA:  Shortness of breath, status post thoracentesis  EXAM: PORTABLE CHEST - 1 VIEW  COMPARISON:  CT chest dated 08/13/2014  FINDINGS: Pleural-based opacity along the lateral right hemithorax, corresponding to a loculated pleural effusion on CT.  Patchy opacity in the left upper lobe, suspicious for pneumonia, although improving when compared to prior chest radiographs.  No pneumothorax status post thoracentesis.  Cardiomegaly.  IMPRESSION: No pneumothorax status post thoracentesis.  Otherwise unchanged.   Electronically Signed   By: Charline Bills M.D.   On: 08/14/2014 19:15   Dg Chest Port 1 View  08/13/2014   CLINICAL DATA:  Atrial fibrillation.  EXAM: PORTABLE CHEST - 1 VIEW  COMPARISON:  August 07, 2014.  FINDINGS: Stable cardiomediastinal silhouette. No pneumothorax is noted. Stable pleural opacity is seen along right lateral chest wall most consistent with loculated effusion, but mass cannot be excluded. Fluid is also noted in the right minor fissure. Increased left perihilar opacity is noted concerning for pneumonia or possibly edema. Bony thorax is intact.  IMPRESSION: Increased left perihilar opacity is noted concerning for pneumonia or possibly edema.  Stable pleural-based opacity is noted along  right lateral chest wall most consistent with loculated pleural effusion, with probable loculated fluid within the right minor fissure is well. However, CT scan of the chest is recommended to rule out pleural-based mass.   Electronically Signed   By: Roque Lias M.D.   On: 08/13/2014 14:00    Microbiology: Recent Results (from  the past 240 hour(s))  Blood Culture (routine x 2)     Status: None   Collection Time: 08/13/14 12:55 PM  Result Value Ref Range Status   Specimen Description BLOOD FOREARM RIGHT  Final   Special Requests BOTTLES DRAWN AEROBIC AND ANAEROBIC 5CC  Final   Culture   Final    NO GROWTH 5 DAYS Performed at [facility]    Report Status 08/20/2014 FINAL  Final  Blood Culture (routine x 2)     Status: None   Collection Time: 08/13/14  1:00 PM  Result Value Ref Range Status   Specimen Description BLOOD LEFT ANTECUBITAL  Final   Special Requests BOTTLES DRAWN AEROBIC AND ANAEROBIC  Final   Culture   Final    NO GROWTH 5 DAYS Performed at [facility]    Report Status 08/19/2014 FINAL  Final  Urine culture     Status: None   Collection Time: 08/13/14  1:30 PM  Result Value Ref Range Status   Specimen Description URINE, CLEAN CATCH  Final   Special Requests NONE  Final   Colony Count NO GROWTH Performed at [facility]   Final   Culture NO GROWTH Performed at [facility]   Final   Report Status 08/14/2014 FINAL  Final  MRSA PCR Screening     Status: None   Collection Time: 08/13/14  7:18 PM  Result Value Ref Range Status   MRSA by PCR NEGATIVE NEGATIVE Final    Comment:        The GeneXpert MRSA Assay (FDA approved for NASAL specimens only), is one component of a comprehensive MRSA colonization surveillance program. It is not intended to diagnose MRSA infection nor to guide or monitor treatment for MRSA infections.   Culture, sputum-assessment     Status: None   Collection Time: 08/13/14 11:18 PM  Result Value  Ref Range Status   Specimen Description SPUTUM  Final   Special Requests NONE  Final   Sputum evaluation   Final    MICROSCOPIC FINDINGS SUGGEST THAT THIS SPECIMEN IS NOT REPRESENTATIVE OF LOWER RESPIRATORY SECRETIONS. PLEASE RECOLLECT. RESULT CALLED TO, READ BACK BY AND VERIFIED WITH: A. WEATHERFORD RN [phone] GREEN R    Report Status 08/14/2014 FINAL  Final  AFB culture with smear     Status: None (Preliminary result)   Collection Time: 08/14/14  4:25 PM  Result Value Ref Range Status   Specimen Description PLEURAL FLUID LEFT  Final   Special Requests NONE  Final   Acid Fast Smear   Final    NO ACID FAST BACILLI SEEN Performed at [facility]    Culture   Final    CULTURE WILL BE EXAMINED FOR 6 WEEKS BEFORE ISSUING A FINAL REPORT Performed at [facility]    Report Status PENDING  Incomplete  Body fluid culture     Status: None   Collection Time: 08/14/14  4:26 PM  Result Value Ref Range Status   Specimen Description PLEURAL FLUID LEFT  Final   Special Requests NONE  Final   Gram Stain   Final    RARE WBC PRESENT,BOTH PMN AND MONONUCLEAR NO ORGANISMS SEEN Performed at [facility]    Culture   Final    NO GROWTH 3 DAYS Performed at [facility]    Report Status 08/18/2014 FINAL  Final  Fungus Culture with Smear     Status: None (Preliminary result)   Collection  Time: 08/14/14  4:26 PM  Result Value Ref Range Status   Specimen Description FLUID PLEURAL LEFT  Final   Special Requests NONE  Final   Fungal Smear   Final    NO YEAST OR FUNGAL ELEMENTS SEEN Performed at [facility]    Culture   Final    CULTURE IN PROGRESS FOR FOUR WEEKS Performed at [facility]    Report Status PENDING  Incomplete     Labs: Basic Metabolic Panel:  Recent Labs Lab 08/16/14 0410 08/17/14 0405 08/18/14 0253 08/18/14 1339 08/19/14 0503 08/20/14 0543  NA 139 137 135  --  136 138  K 3.4* 4.0 3.3* 4.0 4.0 3.7  CL 92* 91* 94*   --  97 102  CO2 36* 37* 27  --  26 28  GLUCOSE 127* 115* 104*  --  154* 278*  BUN 16 16 18   --  14 13  CREATININE 1.01 1.06 1.07  --  0.95 0.84  CALCIUM 8.2* 8.5 8.3*  --  8.6 8.4  MG  --  2.2  --  2.0 2.1  --    Liver Function Tests:  Recent Labs Lab 08/14/14 1906 08/15/14 0256  AST  --  34  ALT  --  41  ALKPHOS  --  73  BILITOT  --  0.8  PROT 6.7 6.7  ALBUMIN  --  2.3*   No results for input(s): LIPASE, AMYLASE in the last 168 hours. No results for input(s): AMMONIA in the last 168 hours. CBC:  Recent Labs Lab 08/17/14 0405 08/18/14 0253 08/19/14 0503 08/20/14 0543 08/21/14 0358  WBC 16.7* 10.9* 12.0* 12.3* 14.5*  HGB 15.1 14.6 16.2 15.0 14.5  HCT 48.3 46.2 50.2 47.7 46.3  MCV 100.4* 97.7 96.2 98.6 97.7  PLT 261 215 221 226 194   Cardiac Enzymes: No results for input(s): CKTOTAL, CKMB, CKMBINDEX, TROPONINI in the last 168 hours. BNP: BNP (last 3 results)  Recent Labs  08/13/14 1755  BNP 1223.4*    ProBNP (last 3 results) No results for input(s): PROBNP in the last 8760 hours.  CBG: No results for input(s): GLUCAP in the last 168 hours.     SignedEdsel Petrin  [facility] 08/21/2014, 10:24 AM

## 2014-08-21 NOTE — Progress Notes (Signed)
Patient sitting up on side of bed. Denies any complaints. Instructed to call for assistance if needed.

## 2014-08-26 ENCOUNTER — Ambulatory Visit (INDEPENDENT_AMBULATORY_CARE_PROVIDER_SITE_OTHER): Payer: BLUE CROSS/BLUE SHIELD | Admitting: Pharmacist

## 2014-08-26 DIAGNOSIS — I4891 Unspecified atrial fibrillation: Secondary | ICD-10-CM | POA: Insufficient documentation

## 2014-08-26 DIAGNOSIS — I482 Chronic atrial fibrillation, unspecified: Secondary | ICD-10-CM | POA: Insufficient documentation

## 2014-08-26 LAB — POCT INR: INR: 1.3

## 2014-08-26 NOTE — Patient Instructions (Signed)
Anticoagulation Dose Instructions as of 08/26/2014      Jason SmilesSun Mon Tue Wed Thu Fri Sat   New Dose 5 mg 5 mg 5 mg 10 mg 10 mg 5 mg 5 mg    Description        Take 2 tablets today and tomorrow - recheck INR at appt 08/28/2014     Continue Lovenox  INR was 1.3 today (too thick - goal is 2.0 to 3.0)

## 2014-08-27 ENCOUNTER — Inpatient Hospital Stay (HOSPITAL_COMMUNITY): Admit: 2014-08-27 | Payer: BLUE CROSS/BLUE SHIELD

## 2014-08-28 ENCOUNTER — Ambulatory Visit: Payer: BLUE CROSS/BLUE SHIELD | Admitting: Family Medicine

## 2014-09-01 ENCOUNTER — Ambulatory Visit (INDEPENDENT_AMBULATORY_CARE_PROVIDER_SITE_OTHER): Payer: BLUE CROSS/BLUE SHIELD | Admitting: Family

## 2014-09-01 ENCOUNTER — Encounter: Payer: Self-pay | Admitting: Family

## 2014-09-01 ENCOUNTER — Ambulatory Visit: Payer: BLUE CROSS/BLUE SHIELD | Admitting: Family Medicine

## 2014-09-01 VITALS — BP 137/71 | HR 62 | Temp 97.2°F | Ht 69.0 in | Wt 179.6 lb

## 2014-09-01 DIAGNOSIS — Z09 Encounter for follow-up examination after completed treatment for conditions other than malignant neoplasm: Secondary | ICD-10-CM

## 2014-09-01 DIAGNOSIS — J189 Pneumonia, unspecified organism: Secondary | ICD-10-CM

## 2014-09-01 NOTE — Progress Notes (Signed)
   Subjective:    Patient ID: Jason Spencer, male    DOB: 1951/04/03, 64 y.o.   MRN: [id number]  HPI Pt presents to the office today for hospital follow up. Pt was admitted on 08/13/14 for pneumonia,  fluid build up, and stent placement. Pt states he is feeling much better now, just a little weak. He was discharged on 08/21/14.  Pt denies any headache, palpitations, SOB, or edema at this time.     Review of Systems  Constitutional: Negative.   HENT: Negative.   Respiratory: Negative.   Cardiovascular: Negative.   Gastrointestinal: Negative.   Endocrine: Negative.   Genitourinary: Negative.   Musculoskeletal: Negative.   Neurological: Negative.   Hematological: Negative.   Psychiatric/Behavioral: Negative.   All other systems reviewed and are negative.      Objective:   Physical Exam  Constitutional: He is oriented to person, place, and time. He appears well-developed and well-nourished. No distress.  HENT:  Head: Normocephalic.  Right Ear: External ear normal.  Left Ear: External ear normal.  Mouth/Throat: Oropharynx is clear and moist.  Eyes: Pupils are equal, round, and reactive to light. Right eye exhibits no discharge. Left eye exhibits no discharge.  Neck: Normal range of motion. Neck supple. No thyromegaly present.  Cardiovascular: Normal rate, regular rhythm, normal heart sounds and intact distal pulses.   No murmur heard. Pulmonary/Chest: Effort normal and breath sounds normal. No respiratory distress. He has no wheezes.  Abdominal: Soft. Bowel sounds are normal. He exhibits no distension. There is no tenderness.  Musculoskeletal: Normal range of motion. He exhibits no edema or tenderness.  Neurological: He is alert and oriented to person, place, and time. He has normal reflexes. No cranial nerve deficit.  Skin: Skin is warm and dry. No rash noted. No erythema.  Psychiatric: He has a normal mood and affect. His behavior is normal. Judgment and thought content normal.    Vitals reviewed.   BP 137/71 mmHg  Pulse 62  Temp(Src) 97.2 F (36.2 C) (Oral)  Ht 5\' 9"  (1.753 m)  Wt 179 lb 9.6 oz (81.466 kg)  BMI 26.51 kg/m2       Assessment & Plan:  1. Hospital discharge follow-up   Continue all meds Diet and exercise encouraged RTO prn and keep chronic follow up for 3 months  Jannifer Rodneyhristy Chai Verdejo, FNP

## 2014-09-01 NOTE — Patient Instructions (Signed)

## 2014-09-03 ENCOUNTER — Ambulatory Visit (INDEPENDENT_AMBULATORY_CARE_PROVIDER_SITE_OTHER): Payer: BLUE CROSS/BLUE SHIELD | Admitting: Pharmacist

## 2014-09-03 DIAGNOSIS — I4891 Unspecified atrial fibrillation: Secondary | ICD-10-CM

## 2014-09-03 LAB — POCT INR: INR: 1.8

## 2014-09-03 NOTE — Patient Instructions (Signed)
Anticoagulation Dose Instructions as of 09/03/2014      Jason SmilesSun Mon Tue Wed Thu Fri Sat   New Dose 5 mg 5 mg 5 mg 5 mg 5 mg 5 mg 5 mg    Description        Take 2 tablets today - Thursday, Feb. 25th, then continue 1 tablet daily of warfarin 5mg .        INR was 1.8 today

## 2014-09-11 ENCOUNTER — Ambulatory Visit (INDEPENDENT_AMBULATORY_CARE_PROVIDER_SITE_OTHER): Payer: BLUE CROSS/BLUE SHIELD | Admitting: Pharmacist

## 2014-09-11 DIAGNOSIS — I4891 Unspecified atrial fibrillation: Secondary | ICD-10-CM | POA: Diagnosis not present

## 2014-09-11 LAB — FUNGUS CULTURE W SMEAR: FUNGAL SMEAR: NONE SEEN

## 2014-09-11 LAB — POCT INR: INR: 1.7

## 2014-09-11 NOTE — Patient Instructions (Signed)
Anticoagulation Dose Instructions as of 09/11/2014      Jason SmilesSun Mon Tue Wed Thu Fri Sat   New Dose 5 mg 7.5 mg 5 mg 5 mg 5 mg 7.5 mg 5 mg    Description        Increase warfarin dose to 1 and 1/2 tablet mondays and fridays and 1 tablet all other days.        INR was 1.7 today

## 2014-09-22 ENCOUNTER — Ambulatory Visit (INDEPENDENT_AMBULATORY_CARE_PROVIDER_SITE_OTHER): Payer: BLUE CROSS/BLUE SHIELD | Admitting: Pharmacist Clinician (PhC)/ Clinical Pharmacy Specialist

## 2014-09-22 DIAGNOSIS — I4891 Unspecified atrial fibrillation: Secondary | ICD-10-CM

## 2014-09-22 LAB — POCT INR: INR: 1

## 2014-09-22 MED ORDER — WARFARIN SODIUM 5 MG PO TABS: 5.0000 mg | ORAL_TABLET | Freq: Every day | ORAL | Status: DC

## 2014-09-22 NOTE — Patient Instructions (Signed)
Anticoagulation Dose Instructions as of 09/22/2014      Jason SmilesSun Mon Tue Wed Thu Fri Sat   New Dose 5 mg 7.5 mg 5 mg 5 mg 5 mg 7.5 mg 5 mg    Description        Take 1 1/2 tablets for three days in a row then resume above schedule

## 2014-09-25 ENCOUNTER — Telehealth: Payer: Self-pay | Admitting: Family

## 2014-09-26 LAB — AFB CULTURE WITH SMEAR (NOT AT ARMC): Acid Fast Smear: NONE SEEN

## 2014-10-09 ENCOUNTER — Telehealth: Payer: Self-pay | Admitting: Pharmacist

## 2014-10-09 NOTE — Telephone Encounter (Signed)
Tried to call to reschedule missed protime appointment - no answer.

## 2014-10-12 NOTE — Telephone Encounter (Signed)
Spoke with patient's mother.  Patient was sleeping.  Won't know work scheduled until tonight when he does into work.  Will call Tuesday 10/13/14 to make appt.

## 2014-10-13 ENCOUNTER — Ambulatory Visit (INDEPENDENT_AMBULATORY_CARE_PROVIDER_SITE_OTHER): Payer: BLUE CROSS/BLUE SHIELD | Admitting: Pharmacist Clinician (PhC)/ Clinical Pharmacy Specialist

## 2014-10-13 DIAGNOSIS — I4891 Unspecified atrial fibrillation: Secondary | ICD-10-CM

## 2014-10-13 LAB — POCT INR: INR: 1.1

## 2014-10-13 NOTE — Patient Instructions (Signed)
Anticoagulation Dose Instructions as of 10/13/2014      Jason SmilesSun Mon Tue Wed Thu Fri Sat   New Dose 5 mg 7.5 mg 5 mg 5 mg 5 mg 7.5 mg 5 mg    Description        Take 1 1/2 tablets a day for three days then resume regular schedule.

## 2014-10-14 NOTE — Telephone Encounter (Signed)
APPT MADE 10/13/2014 - INR completed

## 2014-10-20 ENCOUNTER — Ambulatory Visit (INDEPENDENT_AMBULATORY_CARE_PROVIDER_SITE_OTHER): Payer: BLUE CROSS/BLUE SHIELD | Admitting: Pharmacist Clinician (PhC)/ Clinical Pharmacy Specialist

## 2014-10-20 DIAGNOSIS — I4891 Unspecified atrial fibrillation: Secondary | ICD-10-CM

## 2014-10-20 LAB — POCT INR: INR: 1.2

## 2014-10-21 ENCOUNTER — Telehealth: Payer: Self-pay | Admitting: Pharmacist Clinician (PhC)/ Clinical Pharmacy Specialist

## 2014-10-21 LAB — CBC WITH DIFFERENTIAL/PLATELET
Basophils Absolute: 0 10*3/uL (ref 0.0–0.2)
Basos: 0 %
Eos: 3 %
Eosinophils Absolute: 0.2 10*3/uL (ref 0.0–0.4)
HEMATOCRIT: 43.1 % (ref 37.5–51.0)
Hemoglobin: 14.2 g/dL (ref 12.6–17.7)
IMMATURE GRANS (ABS): 0.1 10*3/uL (ref 0.0–0.1)
IMMATURE GRANULOCYTES: 1 %
Lymphocytes Absolute: 1.6 10*3/uL (ref 0.7–3.1)
Lymphs: 22 %
MCH: 30.9 pg (ref 26.6–33.0)
MCHC: 32.9 g/dL (ref 31.5–35.7)
MCV: 94 fL (ref 79–97)
MONOCYTES: 13 %
MONOS ABS: 0.9 10*3/uL (ref 0.1–0.9)
Neutrophils Absolute: 4.2 10*3/uL (ref 1.4–7.0)
Neutrophils Relative %: 61 %
Platelets: 235 10*3/uL (ref 150–379)
RBC: 4.59 x10E6/uL (ref 4.14–5.80)
RDW: 17.2 % — AB (ref 12.3–15.4)
WBC: 6.9 10*3/uL (ref 3.4–10.8)

## 2014-10-21 LAB — PROTIME-INR
INR: 1.2 (ref 0.8–1.2)
Prothrombin Time: 12.3 s — ABNORMAL HIGH (ref 9.1–12.0)

## 2014-10-21 NOTE — Telephone Encounter (Signed)
Called patient to go over INR and CBC results.  His sister answered the phone and said he had gone out and she wasn't sure where or when he would return,  I told her I would try back in an hour to see if he was home.  Patient does not have a cell phone per sister.

## 2014-10-23 ENCOUNTER — Telehealth: Payer: Self-pay | Admitting: Pharmacist Clinician (PhC)/ Clinical Pharmacy Specialist

## 2014-10-23 NOTE — Telephone Encounter (Signed)
Called patient again and he was sleeping.  I spoke with his mother for 15 minutes about patient.  She questions whether or not he is taking his medications.  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  Mother also takes warfarin and has told her son the importance of taking his medication every day.  Mother also states that patient consumes beer, quantity unknown.  His CBC was normal and his INR that was sent out was consistent with what I got in office.  Patient was increased to taking 7.5mg  a day and will re-check INR in 1 week.  Appointment and new directions were give to Mrs. Claudette LawsWatson and she wrote them down.

## 2014-10-29 ENCOUNTER — Ambulatory Visit (INDEPENDENT_AMBULATORY_CARE_PROVIDER_SITE_OTHER): Payer: BLUE CROSS/BLUE SHIELD | Admitting: Pharmacist

## 2014-10-29 DIAGNOSIS — I4891 Unspecified atrial fibrillation: Secondary | ICD-10-CM | POA: Diagnosis not present

## 2014-10-29 LAB — POCT INR: INR: 1.1

## 2014-10-29 NOTE — Patient Instructions (Signed)
Anticoagulation Dose Instructions as of 10/29/2014      Glynis SmilesSun Mon Tue Wed Thu Fri Sat   New Dose 5 mg 7.5 mg 5 mg 5 mg 5 mg 7.5 mg 5 mg    Description        Take 1 and 1/2 tablets of warfarin 5mg  for the next 3 days, then restate 1 and 1/2 tablets on mondays and fridays.  Take 1 tablet all other days.     INR was 1.1 today  Restart clopidogrel and daily aspirin.  We are sending request to reschedule missed cardiology follow up

## 2014-10-31 ENCOUNTER — Other Ambulatory Visit: Payer: Self-pay | Admitting: Nurse Practitioner

## 2014-10-31 ENCOUNTER — Telehealth: Payer: Self-pay | Admitting: Family

## 2014-10-31 MED ORDER — CLOPIDOGREL BISULFATE 75 MG PO TABS: 75.0000 mg | ORAL_TABLET | Freq: Every day | ORAL | Status: DC

## 2014-12-01 ENCOUNTER — Ambulatory Visit: Payer: BLUE CROSS/BLUE SHIELD | Admitting: Family

## 2014-12-14 ENCOUNTER — Telehealth: Payer: Self-pay | Admitting: Pharmacist

## 2014-12-14 NOTE — Telephone Encounter (Signed)
Tried to call patient to schedule appt for protime. He also need follow up with PCP and cardiologist.  No answer to phone and unable to leave message.

## 2015-01-04 ENCOUNTER — Telehealth: Payer: Self-pay | Admitting: Pharmacist

## 2015-01-04 NOTE — Telephone Encounter (Signed)
Appt set up fro 01/07/15 at 9am to recheck protime and discuss medication compliance.

## 2015-01-04 NOTE — Telephone Encounter (Signed)
Patient needs appt to recheck INR and follow up with PCP.  Tried to call - no answer.

## 2015-01-07 ENCOUNTER — Encounter: Payer: Self-pay | Admitting: Pharmacist

## 2015-01-07 ENCOUNTER — Ambulatory Visit (INDEPENDENT_AMBULATORY_CARE_PROVIDER_SITE_OTHER): Payer: BLUE CROSS/BLUE SHIELD | Admitting: Pharmacist

## 2015-01-07 VITALS — BP 144/75 | HR 77

## 2015-01-07 DIAGNOSIS — I4891 Unspecified atrial fibrillation: Secondary | ICD-10-CM

## 2015-01-07 LAB — POCT INR: INR: 1

## 2015-01-07 NOTE — Progress Notes (Signed)
Pt presents to the office today for follow up of protime.   Patient has been non compliant with follow up and medications.  Until February of this year he rarely came to the Dr and he did not take any medications regularly.   In Feb he was hospitalized - received stent and diagnoses with CHF and atrial fibrillation.  He was states on warfarin, plavix, ACE Inhibitor, Beta blocker, furosemide and spironolactone.  Patient has stopped appt meds except warfarin (which he only restart about 3 days ago) and Plavix.  He states that he felt awful on all the above medications and when he stopped he felt better.   He works 3rd shift for [employer] and work has been off and on lately.  He is concerned about finances and copays.  Patient has not kept appts with cardiologist or PCP.   He also have not kept protime appts.   Long discussion with patient at the need to follow up with cardiologist and to take his medications as prescribed.  Appt was made to see [place]   Also patient will RTC to have INR checked by lab.

## 2015-01-07 NOTE — Patient Instructions (Addendum)
Anticoagulation Dose Instructions as of 01/07/2015      Jason SmilesSun Mon Tue Wed Thu Fri Sat   New Dose 5 mg 7.5 mg 5 mg 5 mg 7.5 mg 7.5 mg 7.5 mg   Alt Week 5 mg 7.5 mg 5 mg 5 mg 5 mg 7.5 mg 5 mg    Description        Take 1 and 1/2 tablets of warfarin 5mg  for the next 3 days, then start 1 and 1/2 tablets on mondays and fridays.  Take 1 tablet all other days.     INR was 1.1 today   [facility] - if you need to change appointment with Dr Kirtland BouchardHocherin - [phone]

## 2015-01-20 ENCOUNTER — Other Ambulatory Visit: Payer: Self-pay

## 2015-02-17 ENCOUNTER — Encounter: Payer: Self-pay | Admitting: Cardiology

## 2015-02-17 ENCOUNTER — Ambulatory Visit (INDEPENDENT_AMBULATORY_CARE_PROVIDER_SITE_OTHER): Payer: BLUE CROSS/BLUE SHIELD | Admitting: Cardiology

## 2015-02-17 VITALS — BP 152/82 | HR 113 | Ht 69.0 in | Wt 197.0 lb

## 2015-02-17 DIAGNOSIS — I5023 Acute on chronic systolic (congestive) heart failure: Secondary | ICD-10-CM | POA: Diagnosis not present

## 2015-02-17 DIAGNOSIS — I2510 Atherosclerotic heart disease of native coronary artery without angina pectoris: Secondary | ICD-10-CM | POA: Insufficient documentation

## 2015-02-17 DIAGNOSIS — I4891 Unspecified atrial fibrillation: Secondary | ICD-10-CM | POA: Diagnosis not present

## 2015-02-17 MED ORDER — LISINOPRIL 5 MG PO TABS: 5.0000 mg | ORAL_TABLET | Freq: Two times a day (BID) | ORAL | Status: DC

## 2015-02-17 NOTE — Progress Notes (Signed)
Cardiology Office Note   Date:  02/17/2015   ID:  Jason Spencer, DOB 1950/07/15, MRN [id number]  PCP:  [name], FNP  Cardiologist:   Rollene Rotunda, MD   Chief Complaint  Patient presents with  . Atrial Flutter  . Cardiomyopathy      History of Present Illness: Jason Spencer is a 64 y.o. male who presents for follow-up of cardiomyopathy, arrhythmia and coronary disease. This is my first visit with him. He had a very complex hospitalization in February of this year. At that time he presented with respiratory failure. He was treated for a pneumonia and had a pleural effusion with a VATS. However, he was also in atrial fibrillation with rapid rate. He was found to have dilated cardiomyopathy with an EF of 20%. He had a diagnostic cardiac catheterization and was found to have high-grade right coronary artery stenosis which was treated with stenting. He underwent cardioversion. He was discharged on multiple medications including ACE inhibitor, beta blockers, aspirin and Plavix and warfarin. He ultimately was to be only on warfarin and Plavix for a month and then warfarin alone. He was instructed in follow-up and was to be seen in the heart failure clinic but was a no-show for this. He was to be taking his warfarin and following this at [facility].  He has had it checked a couple of times and has been subtherapeutic and he hasn't been back it looks like since June. He missed the last appointment. He comes in today and after probing it turns out he is probably not taking any of his medications although he says he might be taking his warfarin. He said he felt better when he stopped his medicines. He smokes cigarettes. He's drinking 3-4 beers most nights. Despite this he denies any symptoms. He denies any shortness of breath, PND or orthopnea. He has had no palpitations, presyncope or syncope. He's had no weight gain or edema.    Past Medical History  Diagnosis Date  . Atrial fibrillation with  RVR 08/13/2014    new onset/notes 08/13/2014  . CAP (community acquired pneumonia) 07/2014  . Acute CHF     [name] 08/13/2014  . Migraine 1970     "don't know why; they just left and haven't come back"    Past Surgical History  Procedure Laterality Date  . No past surgeries    . Left and right heart catheterization with coronary angiogram N/A 08/17/2014    Procedure: LEFT AND RIGHT HEART CATHETERIZATION WITH CORONARY ANGIOGRAM;  Surgeon: Lesleigh Noe, MD;  Location: [facility] CATH LAB;  Service: Cardiovascular;  Laterality: N/A;  . Percutaneous coronary stent intervention (pci-s)  08/17/2014    Procedure: PERCUTANEOUS CORONARY STENT INTERVENTION (PCI-S);  Surgeon: Lesleigh Noe, MD;  Location: [facility] CATH LAB;  Service: Cardiovascular;;  . Tee without cardioversion N/A 08/19/2014    Procedure: TRANSESOPHAGEAL ECHOCARDIOGRAM (TEE);  Surgeon: Laurey Morale, MD;  Location: [facility] ENDOSCOPY;  Service: Cardiovascular;  Laterality: N/A;  . Cardioversion N/A 08/19/2014    Procedure: CARDIOVERSION;  Surgeon: Laurey Morale, MD;  Location: [facility] ENDOSCOPY;  Service: Cardiovascular;  Laterality: N/A;     Current Outpatient Prescriptions  Medication Sig Dispense Refill  . acetaminophen (TYLENOL) 325 MG tablet Take 2 tablets (650 mg total) by mouth every 4 (four) hours as needed for headache or mild pain.    Marland Kitchen warfarin (COUMADIN) 5 MG tablet Take 1 tablet (5 mg total) by mouth daily at 6 PM. Take 1  1/2 tablets on Mondays and Fridays and 1 tablet all other days of the week as directed. 30 tablet 6  . amiodarone (PACERONE) 400 MG tablet Take 1 tablet (400 mg total) by mouth 2 (two) times daily. (Patient not taking: Reported on 01/07/2015) 14 tablet 0  . aspirin 81 MG chewable tablet Chew 1 tablet (81 mg total) by mouth daily. (Patient not taking: Reported on 08/26/2014) 30 tablet 0  . atorvastatin (LIPITOR) 40 MG tablet Take 1 tablet (40 mg total) by mouth daily at 6 PM. (Patient not taking: Reported on 01/07/2015) 30  tablet 0  . bisoprolol (ZEBETA) 5 MG tablet Take 1.5 tablets (7.5 mg total) by mouth daily. (Patient not taking: Reported on 01/07/2015) 30 tablet 0  . clopidogrel (PLAVIX) 75 MG tablet Take 1 tablet (75 mg total) by mouth daily with breakfast. (Patient not taking: Reported on 02/17/2015) 30 tablet 0  . furosemide (LASIX) 40 MG tablet Take 1 tablet (40 mg total) by mouth daily. (Patient not taking: Reported on 01/07/2015) 30 tablet 0  . lisinopril (PRINIVIL,ZESTRIL) 10 MG tablet Take 1 tablet (10 mg total) by mouth 2 (two) times daily. (Patient not taking: Reported on 01/07/2015) 60 tablet 0  . Menthol, Topical Analgesic, (ICY HOT EX) Apply 1 application topically daily as needed (knee pain).    . nicotine (NICODERM CQ - DOSED IN MG/24 HOURS) 21 mg/24hr patch Place 1 patch (21 mg total) onto the skin daily. (Patient not taking: Reported on 01/07/2015) 28 patch 0  . spironolactone (ALDACTONE) 25 MG tablet Take 1 tablet (25 mg total) by mouth daily. (Patient not taking: Reported on 01/07/2015) 30 tablet 0  . Tetrahydrozoline HCl (VISINE OP) Apply 2 drops to eye daily as needed (red eyes).     No current facility-administered medications for this visit.    Allergies:   Review of patient's allergies indicates no known allergies.    Social History:  The patient  reports that he has been smoking Cigarettes.  He has a 22 pack-year smoking history. He has never used smokeless tobacco. He reports that he drinks alcohol. He reports that he does not use illicit drugs.   Family History:  The patient's family history includes Aneurysm in his father; COPD in his father; Heart disease in his mother.    ROS:  Please see the history of present illness.   Otherwise, review of systems are positive for none.   All other systems are reviewed and negative.    PHYSICAL EXAM: VS:  BP 152/82 mmHg  Pulse 113  Ht  (1.753 m)  Wt 197 lb (89.359 kg)  BMI 29.08 kg/m2 , BMI Body mass index is 29.08 kg/(m^2). GENERAL:   Well appearing HEENT:  Pupils equal round and reactive, fundi not visualized, oral mucosa unremarkable NECK:  No jugular venous distention, waveform within normal limits, carotid upstroke brisk and symmetric, no bruits, no thyromegaly LYMPHATICS:  No cervical, inguinal adenopathy LUNGS:  Clear to auscultation bilaterally BACK:  No CVA tenderness CHEST:  Unremarkable HEART:  PMI not displaced or sustained,S1 and S2 within normal limits, no S3 , no clicks, no rubs, no murmurs, irregular ABD:  Flat, positive bowel sounds normal in frequency in pitch, no bruits, no rebound, no guarding, no midline pulsatile mass, no hepatomegaly, no splenomegaly EXT:  2 plus pulses throughout, no edema, no cyanosis no clubbing SKIN:  No rashes no nodules NEURO:  Cranial nerves II through XII grossly intact, motor grossly intact throughout PSYCH:  Cognitively intact, oriented  to person place and time    EKG:  EKG is ordered today. The ekg ordered today demonstrates atrial flutter, variable conduction, rate 113, axis within normal limits, intervals within normal limits, no acute ST-T wave changes.    Recent Labs: 08/13/2014: B Natriuretic Peptide 1223.4*; TSH 3.034 08/15/2014: ALT 41 08/19/2014: Magnesium 2.1 08/20/2014: BUN 13; Creatinine, Ser 0.84; Potassium 3.7; Sodium 138 10/20/2014: Hemoglobin 14.2; Platelets 235    Lipid Panel    Component Value Date/Time   CHOL 167 08/14/2014 0238   TRIG 43 08/14/2014 0238   HDL 69 08/14/2014 0238   CHOLHDL 2.4 08/14/2014 0238   VLDL 9 08/14/2014 0238   LDLCALC 89 08/14/2014 0238      Wt Readings from Last 3 Encounters:  02/17/15 197 lb (89.359 kg)  09/01/14 179 lb 9.6 oz (81.466 kg)  08/21/14 183 lb 8 oz (83.235 kg)      Other studies Reviewed: Additional studies/ records that were reviewed today include: Extensive hospital records.   Review of the above records demonstrates:  Please see elsewhere in the note.     ASSESSMENT AND PLAN:  ATRIAL FLUTTER:   I had a long discussion with the patient about his risk of stroke.  Jason Spencer has a CHA2DS2 - VASc score of 2 with a risk of stroke of 2.2%.  He will restart warfarin and we have arranged follow-up. Once he has been on therapeutic anticoagulation and I'm convinced of this with 3 INR at therapeutic level I would plan DCCV.  CAD: He has no ongoing symptoms. He will continue with attempted risk reduction.  CARDIOMYOPATHY:  I suspect this is related more alcohol in rapid rate arrhythmia rather than coronary disease. We discussed the fact that he could have deterioration in his ejection fraction he doesn't want treatment and that he should not self medicate stopping his medicines as he is down. I will restart his medicines back lisinopril 5 mg twice a day and then add beta blocker and continue to titrate meds.  HTN:  This is being managed in the context of treating his CHF  ETOH:  I will begin to counsel him about this.  TOBACCO:  The patient understands the need to stop smoking and expressed some desire to just put them down.     Current medicines are reviewed at length with the patient today.  The patient does not have concerns regarding medicines.  The following changes have been made:  As above  Labs/ tests ordered today include:  No orders of the defined types were placed in this encounter.    (Greater than 40 minutes reviewing all data with greater than 50% face to face with the patient).  Disposition:   FU with me in two weeks.     Signed, Rollene Rotunda, MD  02/17/2015 3:34 PM    [facility]

## 2015-02-17 NOTE — Patient Instructions (Signed)
Medication Instructions:  Please take Lisinopril 5 mg one tablet twice a day. Continue all other medications as listed.  Follow-Up: 2 weeks follow up appointment with Dr Antoine Poche.  Thank you for choosing [facility]!!

## 2015-02-18 ENCOUNTER — Ambulatory Visit (INDEPENDENT_AMBULATORY_CARE_PROVIDER_SITE_OTHER): Payer: BLUE CROSS/BLUE SHIELD | Admitting: Pharmacist

## 2015-02-18 DIAGNOSIS — I4891 Unspecified atrial fibrillation: Secondary | ICD-10-CM | POA: Diagnosis not present

## 2015-02-18 LAB — POCT INR: INR: 0.9

## 2015-02-18 MED ORDER — WARFARIN SODIUM 5 MG PO TABS: 7.5000 mg | ORAL_TABLET | Freq: Every day | ORAL | Status: DC

## 2015-02-18 NOTE — Patient Instructions (Signed)
Anticoagulation Dose Instructions as of 02/18/2015      Glynis Smiles Tue Wed Thu Fri Sat   New Dose 7.5 mg 7.5 mg 7.5 mg 7.5 mg 7.5 mg 7.5 mg 7.5 mg    Description        Take warfarin  - 1 and 1/2 tablets daily.  Planned cardioversion.  Need 3 weeks of therapeutic INR (2.0-3.0)     INR was 0.9 today   Restart lisinopril - take either  1 tablet twice a day or  1/2 tablet twice a day

## 2015-02-19 ENCOUNTER — Telehealth: Payer: Self-pay | Admitting: Pharmacist

## 2015-02-19 NOTE — Telephone Encounter (Signed)
Verified that patient's mother was on HIPPA and she is.  Called and discussed patient's irregular HR and about medications that Dr Kirtland Bouchard recommended Olena Leatherwood restart and continue.

## 2015-02-22 ENCOUNTER — Other Ambulatory Visit: Payer: Self-pay | Admitting: Nurse Practitioner

## 2015-02-22 ENCOUNTER — Other Ambulatory Visit: Payer: Self-pay

## 2015-02-23 ENCOUNTER — Other Ambulatory Visit (INDEPENDENT_AMBULATORY_CARE_PROVIDER_SITE_OTHER): Payer: BLUE CROSS/BLUE SHIELD

## 2015-02-23 DIAGNOSIS — I4891 Unspecified atrial fibrillation: Secondary | ICD-10-CM | POA: Diagnosis not present

## 2015-02-23 LAB — POCT INR: INR: 1.2

## 2015-02-23 NOTE — Progress Notes (Signed)
Lab only 

## 2015-02-23 NOTE — Addendum Note (Signed)
Addended by: Tommas Olp on: 02/23/2015 11:24 AM   Modules accepted: Orders

## 2015-02-23 NOTE — Telephone Encounter (Signed)
Sent in electronically for Plavix  Seen 02/18/15 Tammy  We have patient on Coumadin???

## 2015-03-03 ENCOUNTER — Ambulatory Visit (INDEPENDENT_AMBULATORY_CARE_PROVIDER_SITE_OTHER): Payer: BLUE CROSS/BLUE SHIELD | Admitting: Cardiology

## 2015-03-03 ENCOUNTER — Ambulatory Visit (INDEPENDENT_AMBULATORY_CARE_PROVIDER_SITE_OTHER): Payer: BLUE CROSS/BLUE SHIELD | Admitting: Pharmacist

## 2015-03-03 ENCOUNTER — Encounter: Payer: Self-pay | Admitting: Cardiology

## 2015-03-03 VITALS — BP 170/92 | HR 80 | Ht 70.0 in | Wt 196.0 lb

## 2015-03-03 DIAGNOSIS — I4891 Unspecified atrial fibrillation: Secondary | ICD-10-CM

## 2015-03-03 LAB — POCT INR: INR: 1.1

## 2015-03-03 NOTE — Patient Instructions (Signed)
Anticoagulation Dose Instructions as of 03/03/2015      Jason Spencer Tue Wed Thu Fri Sat   New Dose 10 mg 15 mg 10 mg 15 mg 15 mg 10 mg 15 mg    Description        Stop Ensure.  Take 3 tablets today and tomorrow, then increase dose to  = 3 tablets on wednesdays and saturdays and  = 2 tablets all other days.      INR was 1.1 today

## 2015-03-03 NOTE — Progress Notes (Signed)
Cardiology Office Note   Date:  03/03/2015   ID:  Jason Spencer, DOB 21-May-1951, MRN [id number]  PCP:  [name], FNP  Cardiologist:   Rollene Rotunda, MD   No chief complaint on file.     History of Present Illness: Jason Spencer is a 64 y.o. male who presents for follow-up of cardiomyopathy, arrhythmia and coronary disease. This is my second visit with him. He had a very complex hospitalization in February of this year. At that time he presented with respiratory failure. He was treated for a pneumonia and had a pleural effusion with a VATS. However, he was also in atrial fibrillation with rapid rate. He was found to have dilated cardiomyopathy with an EF of 20%. He had a diagnostic cardiac catheterization and was found to have high-grade right coronary artery stenosis which was treated with stenting. He underwent cardioversion. He was discharged on multiple medications including ACE inhibitor, beta blockers, aspirin and Plavix and warfarin. He ultimately was to be only on warfarin and Plavix for a month and then warfarin alone. He was to be seen in the heart failure clinic but was a no-show for this. He was to be taking his warfarin and following this at [facility].  However, when I saw him last he was not having his INR checked routinely.  On interview at the last appt it did not seem like he was taking ay of his meds.  He was still drinking beer most nights and smoking.   I restarted lisinopril. He returns for follow-up.       He has had it checked a couple of times and has been subtherapeutic and he hasn't been back it looks like since June. He missed the last appointment. He comes in today and after probing it turns out he is probably not taking any of his medications although he says he might be taking his warfarin. He said he felt better when he stopped his medicines. He smokes cigarettes. He's drinking 3-4 beers most nights. Despite this he denies any symptoms. He denies any  shortness of breath, PND or orthopnea. He has had no palpitations, presyncope or syncope. He's had no weight gain or edema.    Past Medical History  Diagnosis Date  . Atrial fibrillation with RVR 08/13/2014    new onset/notes 08/13/2014  . CAP (community acquired pneumonia) 07/2014  . Acute CHF     [name] 08/13/2014  . Migraine 1970     "don't know why; they just left and haven't come back"    Past Surgical History  Procedure Laterality Date  . No past surgeries    . Left and right heart catheterization with coronary angiogram N/A 08/17/2014    Procedure: LEFT AND RIGHT HEART CATHETERIZATION WITH CORONARY ANGIOGRAM;  Surgeon: Lesleigh Noe, MD;  Location: [facility] CATH LAB;  Service: Cardiovascular;  Laterality: N/A;  . Percutaneous coronary stent intervention (pci-s)  08/17/2014    Procedure: PERCUTANEOUS CORONARY STENT INTERVENTION (PCI-S);  Surgeon: Lesleigh Noe, MD;  Location: [facility] CATH LAB;  Service: Cardiovascular;;  . Tee without cardioversion N/A 08/19/2014    Procedure: TRANSESOPHAGEAL ECHOCARDIOGRAM (TEE);  Surgeon: Laurey Morale, MD;  Location: [facility] ENDOSCOPY;  Service: Cardiovascular;  Laterality: N/A;  . Cardioversion N/A 08/19/2014    Procedure: CARDIOVERSION;  Surgeon: Laurey Morale, MD;  Location: [facility] ENDOSCOPY;  Service: Cardiovascular;  Laterality: N/A;     Current Outpatient Prescriptions  Medication Sig Dispense Refill  . acetaminophen (TYLENOL) 325  MG tablet Take 2 tablets (650 mg total) by mouth every 4 (four) hours as needed for headache or mild pain.    Marland Kitchen lisinopril (PRINIVIL,ZESTRIL) 5 MG tablet Take 1 tablet (5 mg total) by mouth 2 (two) times daily. 60 tablet 3  . warfarin (COUMADIN) 5 MG tablet Take 1.5 tablets (7.5 mg total) by mouth daily at 6 PM. Take 1 1/2 tablets on Mondays and Fridays and 1 tablet all other days of the week as directed. 45 tablet 0   No current facility-administered medications for this visit.    Allergies:   Review of patient's allergies  indicates no known allergies.   ROS:  Please see the history of present illness.   Otherwise, review of systems are positive for none.   All other systems are reviewed and negative.    PHYSICAL EXAM: VS:  BP 170/92 mmHg  Pulse 80  Ht  (1.778 m)  Wt 196 lb (88.905 kg)  BMI 28.12 kg/m2 , BMI Body mass index is 28.12 kg/(m^2). GENERAL:  Well appearing HEENT:  Pupils equal round and reactive, fundi not visualized, oral mucosa unremarkable NECK:  No jugular venous distention, waveform within normal limits, carotid upstroke brisk and symmetric, no bruits, no thyromegaly LYMPHATICS:  No cervical, inguinal adenopathy LUNGS:  Clear to auscultation bilaterally BACK:  No CVA tenderness CHEST:  Unremarkable HEART:  PMI not displaced or sustained,S1 and S2 within normal limits, no S3 , no clicks, no rubs, no murmurs, irregular ABD:  Flat, positive bowel sounds normal in frequency in pitch, no bruits, no rebound, no guarding, no midline pulsatile mass, no hepatomegaly, no splenomegaly EXT:  2 plus pulses throughout, no edema, no cyanosis no clubbing SKIN:  No rashes no nodules NEURO:  Cranial nerves II through XII grossly intact, motor grossly intact throughout PSYCH:  Cognitively intact, oriented to person place and time    EKG:  EKG is ordered today. The ekg ordered today demonstrates atrial flutter, variable conduction, rate 113, axis within normal limits, intervals within normal limits, no acute ST-T wave changes.    Recent Labs: 08/13/2014: B Natriuretic Peptide 1223.4*; TSH 3.034 08/15/2014: ALT 41 08/19/2014: Magnesium 2.1 08/20/2014: BUN 13; Creatinine, Ser 0.84; Potassium 3.7; Sodium 138 10/20/2014: Hemoglobin 14.2; Platelets 235    Lipid Panel    Component Value Date/Time   CHOL 167 08/14/2014 0238   TRIG 43 08/14/2014 0238   HDL 69 08/14/2014 0238   CHOLHDL 2.4 08/14/2014 0238   VLDL 9 08/14/2014 0238   LDLCALC 89 08/14/2014 0238      Wt Readings from Last 3 Encounters:   03/03/15 196 lb (88.905 kg)  02/17/15 197 lb (89.359 kg)  09/01/14 179 lb 9.6 oz (81.466 kg)      Other studies Reviewed: Additional studies/ records that were reviewed today include: Extensive hospital records.   Review of the above records demonstrates:  Please see elsewhere in the note.     ASSESSMENT AND PLAN:  ATRIAL FLUTTER:  I had a long discussion with the patient about his risk of stroke.  Mr. Cedrik Heindl has a CHA2DS2 - VASc score of 2 with a risk of stroke of 2.2%.  He will restart warfarin and we have arranged follow-up. Once he has been on therapeutic anticoagulation and I'm convinced of this with 3 INR at therapeutic level I would plan DCCV.  CAD: He has no ongoing symptoms. He will continue with attempted risk reduction.  CARDIOMYOPATHY:  I suspect this is related more alcohol in  rapid rate arrhythmia rather than coronary disease. We discussed the fact that he could have deterioration in his ejection fraction he doesn't want treatment and that he should not self medicate stopping his medicines as he is down. I will restart his medicines back lisinopril 5 mg twice a day and then add beta blocker and continue to titrate meds.  HTN:  This is being managed in the context of treating his CHF  ETOH:  I will begin to counsel him about this.  TOBACCO:  The patient understands the need to stop smoking and expressed some desire to just put them down.     Current medicines are reviewed at length with the patient today.  The patient does not have concerns regarding medicines.  The following changes have been made:  As above  Labs/ tests ordered today include:  No orders of the defined types were placed in this encounter.    (Greater than 40 minutes reviewing all data with greater than 50% face to face with the patient).  Disposition:   FU with me in two weeks.     Signed, Rollene Rotunda, MD  03/03/2015 3:18 PM    [facility]

## 2015-03-03 NOTE — Progress Notes (Signed)
Cardiology Office Note   Date:  03/03/2015   ID:  Jason Spencer, DOB [birth year], MRN [id number]  PCP:  Jason Spencer, FNP  Cardiologist:   Rollene Rotunda, MD   Chief Complaint  Patient presents with  . Cardiomyopathy      History of Present Illness: Jason Spencer is a 64 y.o. male who presents for follow-up of cardiomyopathy, arrhythmia and coronary disease. This is my second visit with him. He had a very complex hospitalization in February of this year. At that time he presented with respiratory failure. He was treated for a pneumonia and had a pleural effusion with a VATS. However, he was also in atrial fibrillation with rapid rate. He was found to have dilated cardiomyopathy with an EF of 20%. He had a diagnostic cardiac catheterization and was found to have high-grade right coronary artery stenosis which was treated with stenting. He underwent cardioversion. He was discharged on multiple medications including ACE inhibitor, beta blockers, aspirin and Plavix and warfarin. He ultimately was to be only on warfarin and Plavix for a month and then warfarin alone. He was to be seen in the heart failure clinic but was a no-show for this. He was to be taking his warfarin and following this at [facility].  However, when I saw him last he was not having his INR checked routinely.  On interview at the last appt it did not seem like he was taking ay of his meds.  He was still drinking beer most nights and smoking.   I restarted lisinopril. He returns for follow-up.  Since I last saw him he says that he has been compliant with his meds.  However, I note today that his INR was only 1.1 today.  He reports that he sometimes forgets his PM meds.  The patient denies any new symptoms such as chest discomfort, neck or arm discomfort. There has been no new shortness of breath, PND or orthopnea. There have been no reported palpitations, presyncope or syncope.  He has had no weight gain or edema.  Past Medical  History  Diagnosis Date  . Atrial fibrillation with RVR 08/13/2014    new onset/notes 08/13/2014  . CAP (community acquired pneumonia) 07/2014  . Acute CHF     [name] 08/13/2014  . Migraine 1970   . Cardiomyopathy     Past Surgical History  Procedure Laterality Date  . No past surgeries    . Left and right heart catheterization with coronary angiogram N/A 08/17/2014    Procedure: LEFT AND RIGHT HEART CATHETERIZATION WITH CORONARY ANGIOGRAM;  Surgeon: Lesleigh Noe, MD;  Location: [facility] CATH LAB;  Service: Cardiovascular;  Laterality: N/A;  . Percutaneous coronary stent intervention (pci-s)  08/17/2014    Procedure: PERCUTANEOUS CORONARY STENT INTERVENTION (PCI-S);  Surgeon: Lesleigh Noe, MD;  Location: [facility] CATH LAB;  Service: Cardiovascular;;  . Tee without cardioversion N/A 08/19/2014    Procedure: TRANSESOPHAGEAL ECHOCARDIOGRAM (TEE);  Surgeon: Laurey Morale, MD;  Location: [facility] ENDOSCOPY;  Service: Cardiovascular;  Laterality: N/A;  . Cardioversion N/A 08/19/2014    Procedure: CARDIOVERSION;  Surgeon: Laurey Morale, MD;  Location: [facility] ENDOSCOPY;  Service: Cardiovascular;  Laterality: N/A;     Current Outpatient Prescriptions  Medication Sig Dispense Refill  . acetaminophen (TYLENOL) 325 MG tablet Take 2 tablets (650 mg total) by mouth every 4 (four) hours as needed for headache or mild pain.    Marland Kitchen lisinopril (PRINIVIL,ZESTRIL) 5 MG tablet Take 1 tablet (5 mg  total) by mouth 2 (two) times daily. 60 tablet 3  . warfarin (COUMADIN) 5 MG tablet Take 1.5 tablets (7.5 mg total) by mouth daily at 6 PM. Take 1 1/2 tablets on Mondays and Fridays and 1 tablet all other days of the week as directed. 45 tablet 0   No current facility-administered medications for this visit.    Allergies:   Review of patient's allergies indicates no known allergies.   ROS:  Please see the history of present illness.   Otherwise, review of systems are positive for none.   All other systems are reviewed and negative.     PHYSICAL EXAM: VS:  BP 170/92 mmHg  Pulse 80  Ht  (1.778 m)  Wt 196 lb (88.905 kg)  BMI 28.12 kg/m2 , BMI Body mass index is 28.12 kg/(m^2). GENERAL:  Well appearing HEENT:  Pupils equal round and reactive, fundi not visualized, oral mucosa unremarkable NECK:  No jugular venous distention, waveform within normal limits, carotid upstroke brisk and symmetric, no bruits, no thyromegaly LYMPHATICS:  No cervical, inguinal adenopathy LUNGS:  Clear to auscultation bilaterally BACK:  No CVA tenderness CHEST:  Unremarkable HEART:  PMI not displaced or sustained,S1 and S2 within normal limits, no S3 , no clicks, no rubs, no murmurs, irregular ABD:  Flat, positive bowel sounds normal in frequency in pitch, no bruits, no rebound, no guarding, no midline pulsatile mass, no hepatomegaly, no splenomegaly EXT:  2 plus pulses throughout, no edema, no cyanosis no clubbing    Recent Labs: 08/13/2014: B Natriuretic Peptide 1223.4*; TSH 3.034 08/15/2014: ALT 41 08/19/2014: Magnesium 2.1 08/20/2014: BUN 13; Creatinine, Ser 0.84; Potassium 3.7; Sodium 138 10/20/2014: Hemoglobin 14.2; Platelets 235    Lipid Panel    Component Value Date/Time   CHOL 167 08/14/2014 0238   TRIG 43 08/14/2014 0238   HDL 69 08/14/2014 0238   CHOLHDL 2.4 08/14/2014 0238   VLDL 9 08/14/2014 0238   LDLCALC 89 08/14/2014 0238      Wt Readings from Last 3 Encounters:  03/03/15 196 lb (88.905 kg)  02/17/15 197 lb (89.359 kg)  09/01/14 179 lb 9.6 oz (81.466 kg)      Other studies Reviewed: Additional studies/ records that were reviewed today include:  Labs Review of the above records demonstrates:  Please see elsewhere in the note.     ASSESSMENT AND PLAN:  ATRIAL FLUTTER:  I had a long discussion with the patient about his risk of stroke.  Jason Spencer has a CHA2DS2 - VASc score of 2 with a risk of stroke of 2.2%.  For now he will continue on the meds as listed.  If he remains in flutter/fib after having  three therapeutic INRs in a row I will plan cardioversion.  I again had a long discussion about the risk of noncompliance.   CAD: He has no ongoing symptoms. He will continue with attempted risk reduction.  CARDIOMYOPATHY:  I suspect this is related more alcohol and rapid rate arrhythmia rather than coronary disease. I will increase his lisinopril to 10 mg bid.    HTN:  This is being managed in the context of treating his CHF  ETOH:  I has been to counseled on this.   TOBACCO:  The patient understands the need to stop smoking and we have discussed his previously.      Current medicines are reviewed at length with the patient today.  The patient does not have concerns regarding medicines.  The following changes have been  made:  As above  Labs/ tests ordered today include: None   Disposition:   FU with me in two weeks.     Signed, Rollene Rotunda, MD  03/03/2015 5:50 PM    [facility]

## 2015-03-05 ENCOUNTER — Telehealth: Payer: Self-pay | Admitting: *Deleted

## 2015-03-05 NOTE — Telephone Encounter (Signed)
Left message for pt to call back to schedule appt with Dr Antoine Poche in [place] the next time he is there.

## 2015-03-09 ENCOUNTER — Encounter: Payer: Self-pay | Admitting: Pharmacist Clinician (PhC)/ Clinical Pharmacy Specialist

## 2015-03-10 NOTE — Telephone Encounter (Signed)
Left message for pt to call back to schedule with Dr Antoine Poche in the [place] office the next time he is there.  OK to double book.

## 2015-03-12 ENCOUNTER — Ambulatory Visit (INDEPENDENT_AMBULATORY_CARE_PROVIDER_SITE_OTHER): Payer: BLUE CROSS/BLUE SHIELD | Admitting: Pharmacist

## 2015-03-12 ENCOUNTER — Other Ambulatory Visit (INDEPENDENT_AMBULATORY_CARE_PROVIDER_SITE_OTHER): Payer: BLUE CROSS/BLUE SHIELD

## 2015-03-12 DIAGNOSIS — I4891 Unspecified atrial fibrillation: Secondary | ICD-10-CM

## 2015-03-12 LAB — POCT INR: INR: 2

## 2015-03-12 NOTE — Patient Instructions (Signed)
Anticoagulation Dose Instructions as of 03/12/2015      Jason Spencer Tue Wed Thu Fri Sat   New Dose 10 mg 10 mg 10 mg 15 mg 10 mg 10 mg 15 mg    Description         Continue current warfarin dose of  = 3 tablets on wednesdays and saturdays and  = 2 tablets all other days.      INR was 2.0 today (goal is 2.0 to 3.0)

## 2015-03-12 NOTE — Progress Notes (Signed)
Lab only 

## 2015-03-17 NOTE — Telephone Encounter (Signed)
Was finally able to reach pt by phone to schedule his f/u appt.  Appt made for 9/21 at 9:45 am.

## 2015-03-22 ENCOUNTER — Other Ambulatory Visit: Payer: Self-pay

## 2015-03-23 ENCOUNTER — Other Ambulatory Visit (INDEPENDENT_AMBULATORY_CARE_PROVIDER_SITE_OTHER): Payer: BLUE CROSS/BLUE SHIELD

## 2015-03-23 DIAGNOSIS — I4891 Unspecified atrial fibrillation: Secondary | ICD-10-CM

## 2015-03-23 NOTE — Progress Notes (Signed)
Lab only drew patients arm due to no one here to read protime.  All providers were in a meeting and no appt availiable so i sent out so patient could leave

## 2015-03-24 ENCOUNTER — Telehealth: Payer: Self-pay | Admitting: Family Medicine

## 2015-03-24 LAB — PROTIME-INR
INR: 1.8 — ABNORMAL HIGH (ref 0.8–1.2)
PROTHROMBIN TIME: 18.2 s — AB (ref 9.1–12.0)

## 2015-03-29 NOTE — Telephone Encounter (Signed)
per Marcelino Duster, we have already sent this

## 2015-03-30 ENCOUNTER — Encounter: Payer: Self-pay | Admitting: Pharmacist Clinician (PhC)/ Clinical Pharmacy Specialist

## 2015-03-31 ENCOUNTER — Encounter: Payer: Self-pay | Admitting: Cardiology

## 2015-03-31 ENCOUNTER — Ambulatory Visit (INDEPENDENT_AMBULATORY_CARE_PROVIDER_SITE_OTHER): Payer: BLUE CROSS/BLUE SHIELD | Admitting: Cardiology

## 2015-03-31 ENCOUNTER — Other Ambulatory Visit: Payer: Self-pay | Admitting: Pharmacist

## 2015-03-31 VITALS — BP 180/92 | HR 90 | Ht 70.0 in | Wt 191.0 lb

## 2015-03-31 DIAGNOSIS — I4891 Unspecified atrial fibrillation: Secondary | ICD-10-CM | POA: Diagnosis not present

## 2015-03-31 MED ORDER — METOPROLOL SUCCINATE ER 50 MG PO TB24: 50.0000 mg | ORAL_TABLET | Freq: Every day | ORAL | Status: DC

## 2015-03-31 MED ORDER — WARFARIN SODIUM 5 MG PO TABS: 7.5000 mg | ORAL_TABLET | Freq: Every day | ORAL | Status: DC

## 2015-03-31 MED ORDER — LISINOPRIL 10 MG PO TABS: 10.0000 mg | ORAL_TABLET | Freq: Every day | ORAL | Status: DC

## 2015-03-31 NOTE — Patient Instructions (Signed)
Medication Instructions:  Please take Lisinopril 10 mg twice a day. Start Metoprolol succinate 50 mg once a day. Continue all other medications as listed.  Follow-Up: Follow up in 1 month with Dr Antoine Poche in [place].  Thank you for choosing [facility]!!

## 2015-03-31 NOTE — Progress Notes (Signed)
Cardiology Office Note   Date:  03/31/2015   ID:  Jason Spencer, DOB 1950/10/22, MRN [id number]  PCP:  [name], FNP  Cardiologist:   Rollene Rotunda, MD   Chief Complaint  Patient presents with  . Coronary Artery Disease      History of Present Illness: Jason Spencer is a 64 y.o. male who presents for follow-up of cardiomyopathy, arrhythmia and coronary disease. This is my third visit with him. He had a very complex hospitalization in February of this year. At that time he presented with respiratory failure. He was treated for a pneumonia and had a pleural effusion with a VATS. However, he was also in atrial fibrillation with rapid rate. He was found to have dilated cardiomyopathy with an EF of 20%. He had a diagnostic cardiac catheterization and was found to have high-grade right coronary artery stenosis which was treated with stenting. He underwent cardioversion. He was discharged on multiple medications including ACE inhibitor, beta blockers, aspirin and Plavix and warfarin. He ultimately was to be only on warfarin and Plavix for a month and then warfarin alone. He was to be seen in the heart failure clinic but was a no-show for this. He was to be taking his warfarin and following this at [facility].  However, when I saw him at the last visit he was still not having his INR checked routinely.  At the last visit I again expressed the need to have his warfarin check fine with medications and I increased his lisinopril. He's been more compliant with his warfarin and his INR has been therapeutic or near therapeutic. He's taking his lisinopril 10 mg twice a day sometimes sometimes only once a day. He denies any cardiovascular symptoms.The patient denies any new symptoms such as chest discomfort, neck or arm discomfort. There has been no new shortness of breath, PND or orthopnea. There have been no reported palpitations, presyncope or syncope.  He has had no weight gain or edema.  Past  Medical History  Diagnosis Date  . Atrial fibrillation with RVR 08/13/2014    new onset/notes 08/13/2014  . CAP (community acquired pneumonia) 07/2014  . Acute CHF     [name] 08/13/2014  . Migraine 1970   . Cardiomyopathy     Past Surgical History  Procedure Laterality Date  . No past surgeries    . Left and right heart catheterization with coronary angiogram N/A 08/17/2014    Procedure: LEFT AND RIGHT HEART CATHETERIZATION WITH CORONARY ANGIOGRAM;  Surgeon: Lesleigh Noe, MD;  Location: [facility] CATH LAB;  Service: Cardiovascular;  Laterality: N/A;  . Percutaneous coronary stent intervention (pci-s)  08/17/2014    Procedure: PERCUTANEOUS CORONARY STENT INTERVENTION (PCI-S);  Surgeon: Lesleigh Noe, MD;  Location: [facility] CATH LAB;  Service: Cardiovascular;;  . Tee without cardioversion N/A 08/19/2014    Procedure: TRANSESOPHAGEAL ECHOCARDIOGRAM (TEE);  Surgeon: Laurey Morale, MD;  Location: [facility] ENDOSCOPY;  Service: Cardiovascular;  Laterality: N/A;  . Cardioversion N/A 08/19/2014    Procedure: CARDIOVERSION;  Surgeon: Laurey Morale, MD;  Location: [facility] ENDOSCOPY;  Service: Cardiovascular;  Laterality: N/A;     Current Outpatient Prescriptions  Medication Sig Dispense Refill  . acetaminophen (TYLENOL) 325 MG tablet Take 2 tablets (650 mg total) by mouth every 4 (four) hours as needed for headache or mild pain.    Marland Kitchen lisinopril (PRINIVIL,ZESTRIL) 5 MG tablet Take 1 tablet (5 mg total) by mouth 2 (two) times daily. 60 tablet 3  . warfarin (  COUMADIN) 5 MG tablet Take 1.5 tablets (7.5 mg total) by mouth daily at 6 PM. Take 1 1/2 tablets on Mondays and Fridays and 1 tablet all other days of the week as directed. 45 tablet 0   No current facility-administered medications for this visit.    Allergies:   Review of patient's allergies indicates no known allergies.   ROS:  Please see the history of present illness.   Otherwise, review of systems are positive for none.   All other systems are reviewed and  negative.    PHYSICAL EXAM: VS:  BP 180/92 mmHg  Pulse 90  Ht  (1.778 m)  Wt 191 lb (86.637 kg)  BMI 27.41 kg/m2 , BMI Body mass index is 27.41 kg/(m^2). GENERAL:  Well appearing HEENT:  Pupils equal round and reactive, fundi not visualized, oral mucosa unremarkable NECK:  No jugular venous distention, waveform within normal limits, carotid upstroke brisk and symmetric, no bruits, no thyromegaly LYMPHATICS:  No cervical, inguinal adenopathy LUNGS:  Clear to auscultation bilaterally BACK:  No CVA tenderness CHEST:  Unremarkable HEART:  PMI not displaced or sustained,S1 and S2 within normal limits, no S3, no Sv, no clicks, no rubs, no murmurs ABD:  Flat, positive bowel sounds normal in frequency in pitch, no bruits, no rebound, no guarding, no midline pulsatile mass, no hepatomegaly, no splenomegaly EXT:  2 plus pulses throughout, no edema, no cyanosis no clubbing  EKG:  Sinus rhythm, rate 90, premature atrial contractions, right atrial enlargement, QTC prolonged, no acute ST-T wave changes.  03/31/2015   Recent Labs: 08/13/2014: B Natriuretic Peptide 1223.4*; TSH 3.034 08/15/2014: ALT 41 08/19/2014: Magnesium 2.1 08/20/2014: BUN 13; Creatinine, Ser 0.84; Potassium 3.7; Sodium 138 10/20/2014: Hemoglobin 14.2; Platelets 235    Lipid Panel    Component Value Date/Time   CHOL 167 08/14/2014 0238   TRIG 43 08/14/2014 0238   HDL 69 08/14/2014 0238   CHOLHDL 2.4 08/14/2014 0238   VLDL 9 08/14/2014 0238   LDLCALC 89 08/14/2014 0238      Wt Readings from Last 3 Encounters:  03/31/15 191 lb (86.637 kg)  03/03/15 196 lb (88.905 kg)  02/17/15 197 lb (89.359 kg)      Other studies Reviewed: Additional studies/ records that were reviewed today include:  Labs Review of the above records demonstrates:  Please see elsewhere in the note.     ASSESSMENT AND PLAN:  ATRIAL FLUTTER:   He is now in NSR.     Mr. Jason Spencer has a CHA2DS2 - VASc score of 2 with a risk of stroke of  2.2%.  For now he will continue on the meds as listed.    CAD: He has no ongoing symptoms. He will continue with attempted risk reduction.  CARDIOMYOPATHY:  I suspect this is related more alcohol and rapid rate arrhythmia rather than coronary disease. Reinforced the need to be compliant with medications. Today I'm going to add a low-dose beta blocker.  I will follow-up with an echocardiogram when I think he is compliant taking his medications routinely and I have uptitrated maximally.  HTN:  This is being managed in the context of treating his CHF.  I think this is still elevated because of noncompliance.  ETOH:  I has been counseled on this.   PROLONGED QT:  He needs to avoid any QT prolonging drugs. I am going to start beta blocker. Again we will have a low threshold for ICD evaluation once his meds titrated.  TOBACCO:  The  patient understands the need to stop smoking and we have discussed his previously.     Current medicines are reviewed at length with the patient today.  The patient does not have concerns regarding medicines.  The following changes have been made:  As above  Labs/ tests ordered today include: None   Disposition:   FU with me in 4 weeks.     Signed, Rollene Rotunda, MD  03/31/2015 10:12 AM    [facility]

## 2015-04-01 ENCOUNTER — Ambulatory Visit (INDEPENDENT_AMBULATORY_CARE_PROVIDER_SITE_OTHER): Payer: BLUE CROSS/BLUE SHIELD | Admitting: Pharmacist

## 2015-04-01 DIAGNOSIS — Z8679 Personal history of other diseases of the circulatory system: Secondary | ICD-10-CM | POA: Diagnosis not present

## 2015-04-01 DIAGNOSIS — Z87898 Personal history of other specified conditions: Secondary | ICD-10-CM

## 2015-04-01 DIAGNOSIS — I4891 Unspecified atrial fibrillation: Secondary | ICD-10-CM | POA: Diagnosis not present

## 2015-04-01 LAB — POCT INR: INR: 2

## 2015-04-01 MED ORDER — WARFARIN SODIUM 5 MG PO TABS: 7.5000 mg | ORAL_TABLET | Freq: Every day | ORAL | Status: DC

## 2015-04-01 NOTE — Patient Instructions (Signed)
Anticoagulation Dose Instructions as of 04/01/2015      Jason Spencer Tue Wed Thu Fri Sat   New Dose 10 mg 7.5 mg 10 mg 7.5 mg 10 mg 7.5 mg 10 mg    Description        Take warfarin  - take 1 and 1/2 tablets on mondays, wednesdays and fridays and 2 tablets all other days.      INR was 2.0 today (goal is 2.0 to 3.0)

## 2015-04-19 ENCOUNTER — Other Ambulatory Visit: Payer: Self-pay

## 2015-04-23 ENCOUNTER — Other Ambulatory Visit (INDEPENDENT_AMBULATORY_CARE_PROVIDER_SITE_OTHER): Payer: BLUE CROSS/BLUE SHIELD

## 2015-04-23 DIAGNOSIS — I4891 Unspecified atrial fibrillation: Secondary | ICD-10-CM

## 2015-04-23 LAB — POCT INR: INR: 1.8

## 2015-04-23 NOTE — Progress Notes (Signed)
Lab only 

## 2015-05-12 ENCOUNTER — Ambulatory Visit: Payer: BLUE CROSS/BLUE SHIELD | Admitting: Cardiology

## 2015-05-12 ENCOUNTER — Encounter: Payer: Self-pay | Admitting: Pharmacist

## 2015-05-17 ENCOUNTER — Other Ambulatory Visit: Payer: Self-pay | Admitting: Family Medicine

## 2015-06-23 ENCOUNTER — Telehealth: Payer: Self-pay | Admitting: Pharmacist

## 2015-07-14 NOTE — Telephone Encounter (Signed)
Patient in need of INR check.  He states that he has not come in because he has not been working much and is low on funds.   Appt made for lab to recheck INR 07/15/14 at 11am

## 2015-07-16 ENCOUNTER — Ambulatory Visit (INDEPENDENT_AMBULATORY_CARE_PROVIDER_SITE_OTHER): Payer: BLUE CROSS/BLUE SHIELD | Admitting: Pharmacist

## 2015-07-16 ENCOUNTER — Other Ambulatory Visit (INDEPENDENT_AMBULATORY_CARE_PROVIDER_SITE_OTHER): Payer: BLUE CROSS/BLUE SHIELD

## 2015-07-16 DIAGNOSIS — I4891 Unspecified atrial fibrillation: Secondary | ICD-10-CM

## 2015-07-16 LAB — POCT INR: INR: 1

## 2015-07-16 NOTE — Patient Instructions (Signed)
Anticoagulation Dose Instructions as of 07/16/2015      Glynis SmilesSun Mon Tue Wed Thu Fri Sat   New Dose 10 mg 7.5 mg 10 mg 7.5 mg 10 mg 7.5 mg 10 mg    Description        Take 2 tablets today and tomorrow. Then restart usual dose of warfarin 5mg  - take 1 and 1/2 tablets on mondays, wednesdays and fridays and 2 tablets all other days.

## 2015-08-13 ENCOUNTER — Other Ambulatory Visit: Payer: Self-pay

## 2015-10-21 ENCOUNTER — Other Ambulatory Visit: Payer: Self-pay | Admitting: Family Medicine

## 2015-10-21 NOTE — Telephone Encounter (Signed)
Last seen 12/11/14 Jason Spencer  Seen 09/01/14  Jason Spencer

## 2015-11-01 ENCOUNTER — Telehealth: Payer: Self-pay | Admitting: Pharmacist

## 2015-11-01 NOTE — Telephone Encounter (Signed)
Patient was not at home - LM with his sister to have him call when he gets home from work.

## 2015-11-30 ENCOUNTER — Ambulatory Visit (INDEPENDENT_AMBULATORY_CARE_PROVIDER_SITE_OTHER): Payer: BLUE CROSS/BLUE SHIELD | Admitting: Family

## 2015-11-30 ENCOUNTER — Encounter: Payer: Self-pay | Admitting: Family

## 2015-11-30 VITALS — BP 170/109 | HR 95 | Temp 97.3°F | Ht 70.0 in | Wt 183.0 lb

## 2015-11-30 DIAGNOSIS — F17200 Nicotine dependence, unspecified, uncomplicated: Secondary | ICD-10-CM

## 2015-11-30 DIAGNOSIS — Z720 Tobacco use: Secondary | ICD-10-CM

## 2015-11-30 DIAGNOSIS — I4891 Unspecified atrial fibrillation: Secondary | ICD-10-CM | POA: Diagnosis not present

## 2015-11-30 DIAGNOSIS — I2510 Atherosclerotic heart disease of native coronary artery without angina pectoris: Secondary | ICD-10-CM | POA: Diagnosis not present

## 2015-11-30 LAB — COAGUCHEK XS/INR WAIVED
INR: 1.2 — ABNORMAL HIGH (ref 0.9–1.1)
Prothrombin Time: 14.9 s

## 2015-11-30 MED ORDER — WARFARIN SODIUM 5 MG PO TABS: ORAL_TABLET | ORAL | Status: DC

## 2015-11-30 NOTE — Patient Instructions (Addendum)

## 2015-11-30 NOTE — Progress Notes (Signed)
   Subjective:    Patient ID: Jason Spencer, male    DOB: [birth year], 65 y.o.   MRN: [id number]  HPI Pt presents to the office today to have INR checked for A Fib. PT's INR today is 1.2. PT states he has been out of warfarin for the last two days. PT denies any blood in urine, stool, or excessive bruising. Pt states his A Fib is stable with no complaints at this time.   Review of Systems  All other systems reviewed and are negative.      Objective:   Physical Exam  Constitutional: He is oriented to person, place, and time. He appears well-developed and well-nourished. No distress.  Cardiovascular: Normal rate, regular rhythm, normal heart sounds and intact distal pulses.   No murmur heard. Pulmonary/Chest: Effort normal. No respiratory distress. He has wheezes.  Abdominal: Soft. Bowel sounds are normal. He exhibits no distension. There is no tenderness.  Musculoskeletal: Normal range of motion. He exhibits no edema or tenderness.  Neurological: He is alert and oriented to person, place, and time.  Skin: Skin is warm and dry. No rash noted. No erythema.  Psychiatric: He has a normal mood and affect. His behavior is normal. Judgment and thought content normal.  Vitals reviewed.    BP 170/109 mmHg  Pulse 95  Temp(Src) 97.3 F (36.3 C) (Oral)  Ht 5\' 10"  (1.778 m)  Wt 183 lb (83.008 kg)  BMI 26.26 kg/m2      Assessment & Plan:  1. Atrial fibrillation, unspecified type (HCC) - CoaguChek XS/INR Waived - warfarin (COUMADIN) 5 MG tablet; take 1 and 1/2 tablets on mondays, wednesdays and fridays and 2 tablets all other days.  Dispense: 60 tablet; Refill: 2  2. Atrial fibrillation, new onset w/ RVR - warfarin (COUMADIN) 5 MG tablet; take 1 and 1/2 tablets on mondays, wednesdays and fridays and 2 tablets all other days.  Dispense: 60 tablet; Refill: 2  3. Coronary artery disease involving native coronary artery of native heart without angina pectoris - warfarin (COUMADIN) 5 MG  tablet; take 1 and 1/2 tablets on mondays, wednesdays and fridays and 2 tablets all other days.  Dispense: 60 tablet; Refill: 2  4. Atrial fibrillation, unspecified - warfarin (COUMADIN) 5 MG tablet; take 1 and 1/2 tablets on mondays, wednesdays and fridays and 2 tablets all other days.  Dispense: 60 tablet; Refill: 2  5. Smoking -Smoking cessation discussed  RTO in 1 week to recheck INR  Anticoagulation Dose Instructions as of 11/30/2015      Glynis SmilesSun Mon Tue Wed Thu Fri Sat   New Dose 10 mg 7.5 mg 10 mg 7.5 mg 10 mg 7.5 mg 10 mg    Description        Take 2 tablets today and tomorrow. Then restart usual dose of warfarin 5mg  - take 1 and 1/2 tablets on mondays, wednesdays and fridays and 2 tablets all other days.      Jannifer Rodneyhristy Alston Berrie, FNP

## 2016-04-10 ENCOUNTER — Other Ambulatory Visit: Payer: Self-pay | Admitting: Cardiology

## 2016-04-10 ENCOUNTER — Other Ambulatory Visit: Payer: Self-pay | Admitting: Family

## 2016-04-10 DIAGNOSIS — I4891 Unspecified atrial fibrillation: Secondary | ICD-10-CM

## 2016-04-10 DIAGNOSIS — I2510 Atherosclerotic heart disease of native coronary artery without angina pectoris: Secondary | ICD-10-CM

## 2016-04-10 NOTE — Telephone Encounter (Signed)
REFILL 

## 2016-04-13 ENCOUNTER — Ambulatory Visit (INDEPENDENT_AMBULATORY_CARE_PROVIDER_SITE_OTHER): Payer: BLUE CROSS/BLUE SHIELD | Admitting: Pharmacist

## 2016-04-13 ENCOUNTER — Encounter: Payer: Self-pay | Admitting: Pharmacist

## 2016-04-13 VITALS — BP 140/84 | HR 84

## 2016-04-13 DIAGNOSIS — I2510 Atherosclerotic heart disease of native coronary artery without angina pectoris: Secondary | ICD-10-CM | POA: Diagnosis not present

## 2016-04-13 DIAGNOSIS — I4891 Unspecified atrial fibrillation: Secondary | ICD-10-CM

## 2016-04-13 LAB — COAGUCHEK XS/INR WAIVED
INR: 1.4 — ABNORMAL HIGH (ref 0.9–1.1)
Prothrombin Time: 17.1 s

## 2016-04-13 IMAGING — CT CT CHEST W/ CM
2 of 3 series · 15 of 36 positions shown, 18 images · IV contrast (APPLIED)
Comparison: Chest radiograph 08/13/2014

CLINICAL DATA: Diagnosed with pneumonia 6 days ago, shortness of
breath, cough, weakness, smoker

EXAM:
CT CHEST WITH CONTRAST
TECHNIQUE: Multidetector CT imaging of the chest was performed during
intravenous contrast administration. Sagittal and coronal MPR images
reconstructed from axial data set.
CONTRAST:  100mL OMNIPAQUE IOHEXOL 300 MG/ML  SOLN IV

[Series 2: thorax 5.0 i31f 1 · axial · 0.70mm/px · z∈[-843,-558]mm · 12 of 67 slices shown, 15 images]
[im 5/67  mediastinal]
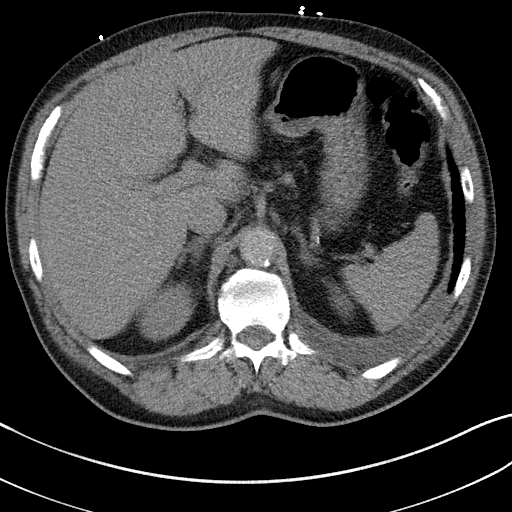
[im 5/67  lung]
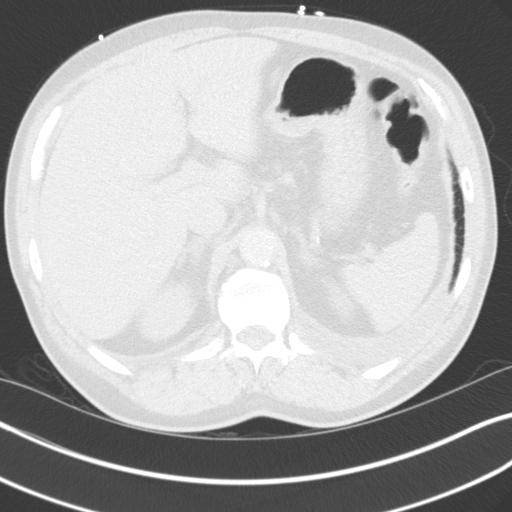
[im 10/67  lung]
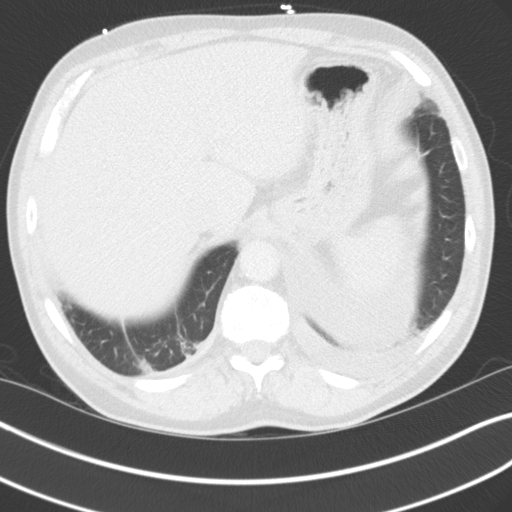
[im 15/67  lung]
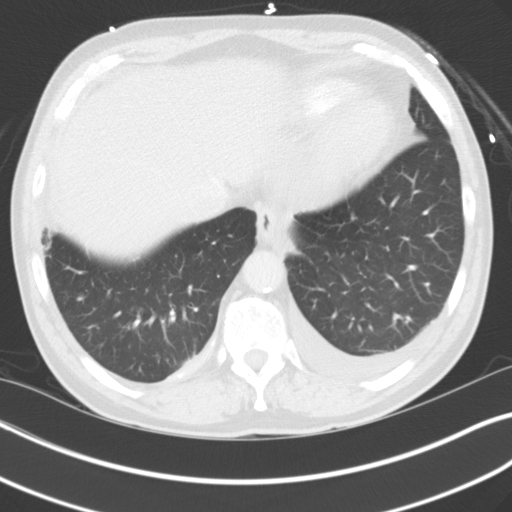
[im 20/67  lung]
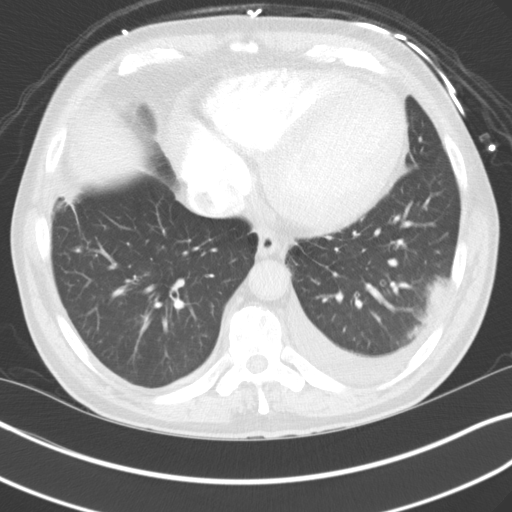
[im 25/67  mediastinal]
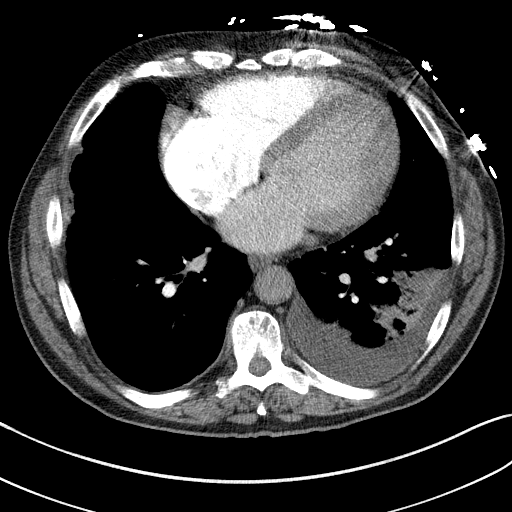
[im 25/67  lung]
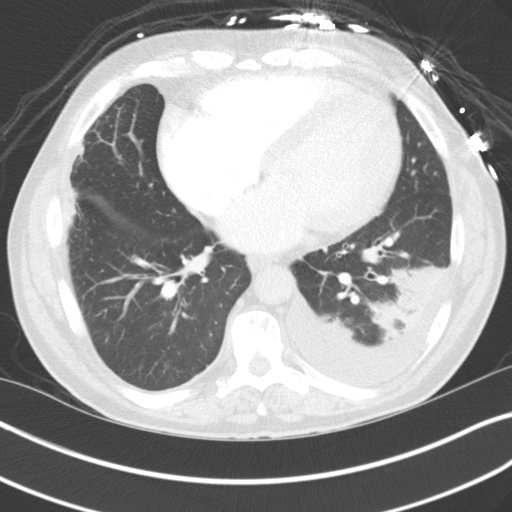
[im 30/67  lung]
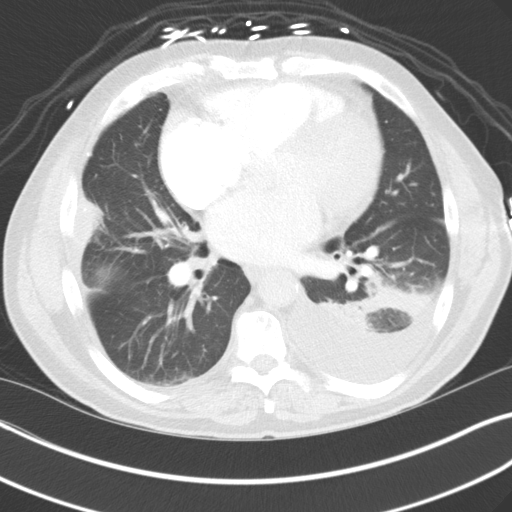
[im 37/67  lung]
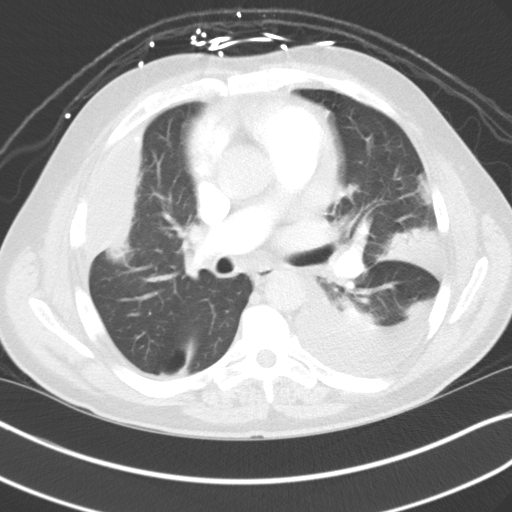
[im 42/67  lung]
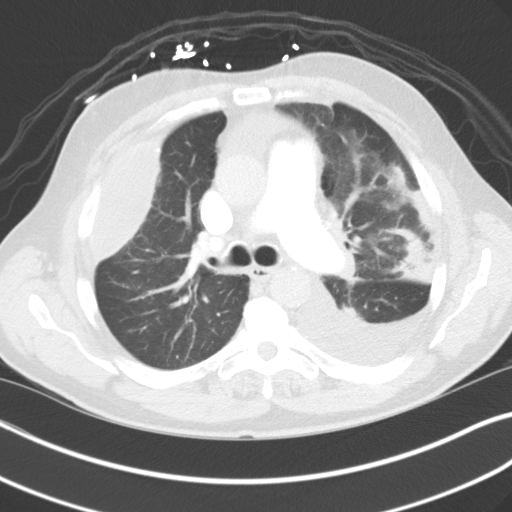
[im 47/67  mediastinal]
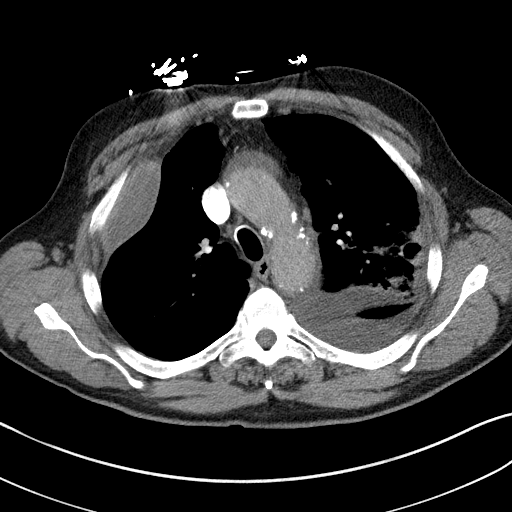
[im 47/67  lung]
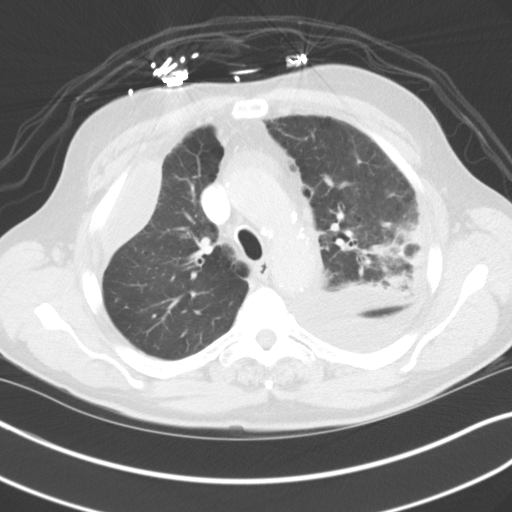
[im 52/67  lung]
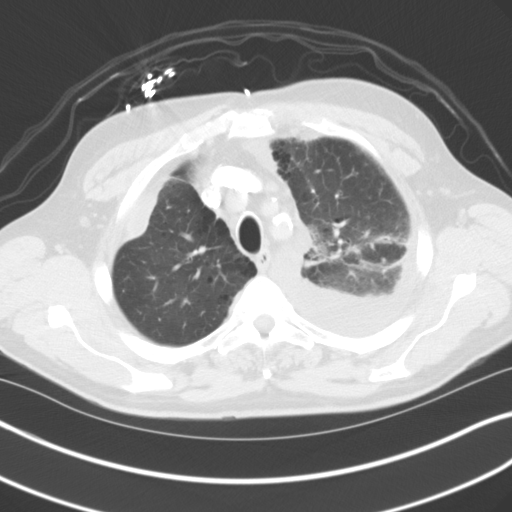
[im 57/67  lung]
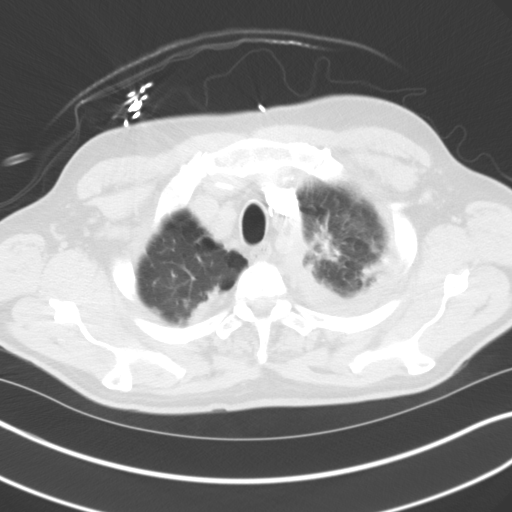
[im 62/67  lung]
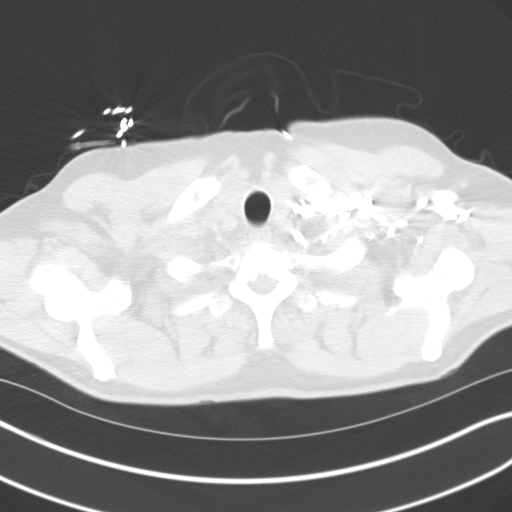

[Series 5: coronal · coronal · 0.65mm/px · 3 of 101 slices shown]
[im 21/101  lung]
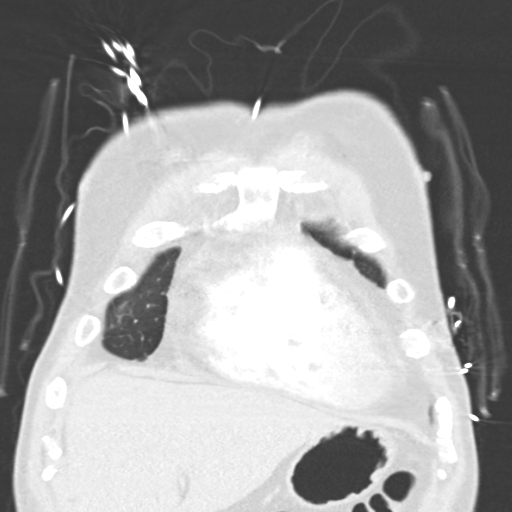
[im 41/101  lung]
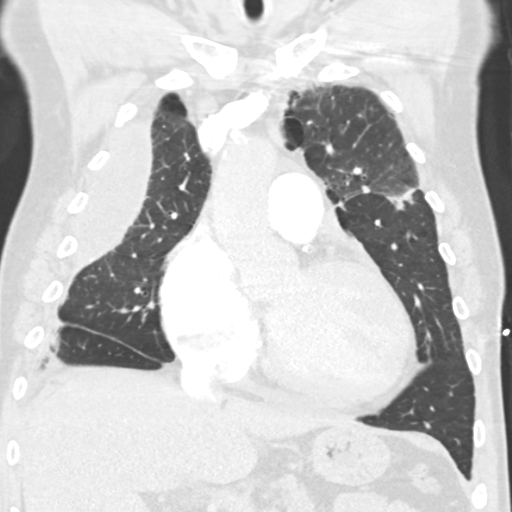
[im 61/101  lung]
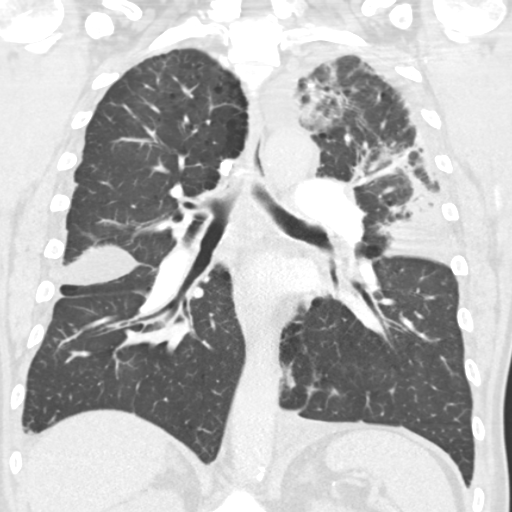

[15 of 36 positions shown; findings below may reference images not displayed]

FINDINGS: Scattered atherosclerotic calcifications aorta and coronary
arteries.

Aortic normal caliber.

Pulmonary arteries well opacified and grossly patent on nondedicated
exam.

Scattered normal size thoracic lymph nodes.

Cardiac chambers appear enlarged.

Pleural calcifications posterior medial RIGHT hemi thorax.

Visualized upper abdomen unremarkable.

Loculated RIGHT pleural effusion.

Dependent LEFT pleural effusion.

Minimal scattered atelectasis in RIGHT lung with more significant
compressive atelectasis in LEFT lower lobe.

Infiltrates identified in LEFT upper and LEFT lower lobes,
particularly peripherally in the LEFT upper lobe where focal density
is seen which could represent rounded atelectasis or pneumonia.

Emphysematous changes at apices and scattered peribronchial
thickening.

No pneumothorax or acute osseous findings.
IMPRESSION: Emphysematous and bronchitic changes question COPD.

BILATERAL pleural effusions, loculated on RIGHT, with scattered
infiltrates in LEFT upper and LEFT lower lobes.

Followup until resolution recommended to exclude underlying
abnormalities particularly in the periphery of the LEFT upper lobe.

Nonspecific pleural calcifications in the RIGHT hemi thorax, could
be related to prior hemothorax, empyema, less likely TB or asbestos
exposure due to unilateral involvement.

## 2016-04-13 MED ORDER — WARFARIN SODIUM 5 MG PO TABS: ORAL_TABLET | ORAL | 0 refills | Status: DC

## 2016-04-13 MED ORDER — METOPROLOL SUCCINATE ER 50 MG PO TB24: 50.0000 mg | ORAL_TABLET | Freq: Every day | ORAL | 0 refills | Status: DC

## 2016-04-13 NOTE — Progress Notes (Signed)
Subjective:     Indication: atrial fibrillation Bleeding signs/symptoms: None Thromboembolic signs/symptoms: None  Missed Coumadin doses: missed dose 1 day this week because ran out of warfarin - he borrowed 2 tablets from his mother the last 2 days. Medication changes: no Dietary changes: no Bacterial/viral infection: no Other concerns: yes - patient is non compliant with medications, office visits and INR rechecks. I am concerned that he does not take his health and medication conditions seriously.     Objective:    INR Today: 1.4  Current dose: warfarin 5mg  - take 1 and 1/2 tablets MWF and 2 tablets all other dasy  Vitals:   04/13/16 0946 04/13/16 0956  BP: (!) 142/88 140/84  Pulse: 84      Assessment:    Subtherapeutic INR for goal of 2-3   Plan:    1. New dose: take 2 and 1/2 tablets for 2 days = 12.5mg .  Then increase dose to 2 tablets = 10mg  daily except 1 and 1/2 tablet = 7.5mg  on Wednesdays only   2. Next INR: 1 week - patient to follow up with PCP - has been several months.  3.. Discussion about compliance and seriousness of his current health. Patietn voices understanding.

## 2016-04-14 LAB — CMP14+EGFR
ALT: 18 IU/L (ref 0–44)
AST: 26 IU/L (ref 0–40)
Albumin/Globulin Ratio: 1.2 (ref 1.2–2.2)
Albumin: 4 g/dL (ref 3.6–4.8)
Alkaline Phosphatase: 55 IU/L (ref 39–117)
BUN / CREAT RATIO: 14 (ref 10–24)
BUN: 11 mg/dL (ref 8–27)
Bilirubin Total: 0.6 mg/dL (ref 0.0–1.2)
CALCIUM: 8.6 mg/dL (ref 8.6–10.2)
CO2: 24 mmol/L (ref 18–29)
CREATININE: 0.81 mg/dL (ref 0.76–1.27)
Chloride: 100 mmol/L (ref 96–106)
GFR calc Af Amer: 110 mL/min/{1.73_m2} (ref >59)
GFR calc non Af Amer: 95 mL/min/{1.73_m2} (ref >59)
GLUCOSE: 111 mg/dL — AB (ref 65–99)
Globulin, Total: 3.3 g/dL (ref 1.5–4.5)
Potassium: 4.3 mmol/L (ref 3.5–5.2)
SODIUM: 139 mmol/L (ref 134–144)
Total Protein: 7.3 g/dL (ref 6.0–8.5)

## 2016-04-27 ENCOUNTER — Encounter: Payer: Self-pay | Admitting: Family

## 2016-04-27 ENCOUNTER — Ambulatory Visit (INDEPENDENT_AMBULATORY_CARE_PROVIDER_SITE_OTHER): Payer: BLUE CROSS/BLUE SHIELD | Admitting: Family

## 2016-04-27 VITALS — BP 157/96 | HR 61 | Temp 96.8°F | Ht 70.0 in | Wt 200.8 lb

## 2016-04-27 DIAGNOSIS — J441 Chronic obstructive pulmonary disease with (acute) exacerbation: Secondary | ICD-10-CM

## 2016-04-27 DIAGNOSIS — F17200 Nicotine dependence, unspecified, uncomplicated: Secondary | ICD-10-CM

## 2016-04-27 DIAGNOSIS — I4891 Unspecified atrial fibrillation: Secondary | ICD-10-CM | POA: Diagnosis not present

## 2016-04-27 DIAGNOSIS — I2510 Atherosclerotic heart disease of native coronary artery without angina pectoris: Secondary | ICD-10-CM | POA: Diagnosis not present

## 2016-04-27 DIAGNOSIS — Z1159 Encounter for screening for other viral diseases: Secondary | ICD-10-CM | POA: Diagnosis not present

## 2016-04-27 DIAGNOSIS — I10 Essential (primary) hypertension: Secondary | ICD-10-CM | POA: Insufficient documentation

## 2016-04-27 DIAGNOSIS — E1165 Type 2 diabetes mellitus with hyperglycemia: Secondary | ICD-10-CM | POA: Diagnosis not present

## 2016-04-27 DIAGNOSIS — Z1211 Encounter for screening for malignant neoplasm of colon: Secondary | ICD-10-CM | POA: Diagnosis not present

## 2016-04-27 DIAGNOSIS — I5023 Acute on chronic systolic (congestive) heart failure: Secondary | ICD-10-CM

## 2016-04-27 LAB — BAYER DCA HB A1C WAIVED: HB A1C: 5.5 % (ref <7.0)

## 2016-04-27 LAB — COAGUCHEK XS/INR WAIVED
INR: 2.3 — ABNORMAL HIGH (ref 0.9–1.1)
Prothrombin Time: 28 s

## 2016-04-27 NOTE — Patient Instructions (Signed)

## 2016-04-27 NOTE — Progress Notes (Signed)
Subjective:    Patient ID: Jason Spencer, male    DOB: [birth year], 65 y.o.   MRN: [id number]   Pt presents to the office today for chronic follow up. Pt has seen a Cardiologists in 03/2015 was suppose to follow up in one month. PT states he did not like that provider and will not return. Pt is followed for CHF, A Fib, and CAD.  Hypertension  This is a chronic problem. The current episode started more than 1 year ago. The problem is unchanged. The problem is uncontrolled. Pertinent negatives include no blurred vision, chest pain, headaches, palpitations, peripheral edema or shortness of breath. Risk factors for coronary artery disease include family history, diabetes mellitus, obesity, male gender, smoking/tobacco exposure and sedentary lifestyle. Past treatments include beta blockers. The current treatment provides mild improvement. Hypertensive end-organ damage includes CAD/MI and heart failure. There is no history of kidney disease or CVA.  Diabetes  He presents for his follow-up diabetic visit. He has type 2 diabetes mellitus. His disease course has been fluctuating. Pertinent negatives for hypoglycemia include no confusion or headaches. Pertinent negatives for diabetes include no blurred vision, no chest pain, no foot paresthesias, no foot ulcerations, no polydipsia, no visual change and no weakness. Pertinent negatives for hypoglycemia complications include no blackouts. Symptoms are worsening. Diabetic complications include heart disease. Pertinent negatives for diabetic complications include no CVA, nephropathy or peripheral neuropathy. Risk factors for coronary artery disease include diabetes mellitus, family history, male sex, obesity, sedentary lifestyle and tobacco exposure. Current diabetic treatment includes diet. He is following a generally unhealthy diet. (Does not check blood sugars ) An ACE inhibitor/angiotensin II receptor blocker is being taken. Eye exam is not current.  A Fib PT  currently taking warfarin and states this is stable.   Review of Systems  Eyes: Negative for blurred vision.  Respiratory: Negative for shortness of breath.   Cardiovascular: Negative for chest pain and palpitations.  Endocrine: Negative for polydipsia.  Neurological: Negative for weakness and headaches.  Psychiatric/Behavioral: Negative for confusion.  All other systems reviewed and are negative.      Objective:   Physical Exam  Constitutional: He is oriented to person, place, and time. He appears well-developed and well-nourished. No distress.  HENT:  Head: Normocephalic.  Right Ear: External ear normal.  Left Ear: External ear normal.  Mouth/Throat: Oropharynx is clear and moist.  Eyes: Pupils are equal, round, and reactive to light. Right eye exhibits no discharge. Left eye exhibits no discharge.  Neck: Normal range of motion. Neck supple. No thyromegaly present.  Cardiovascular: Normal rate, normal heart sounds and intact distal pulses.  An irregular rhythm present.  No murmur heard. Pulmonary/Chest: Effort normal. No respiratory distress. He has no wheezes.  Diminished breath sounds  Abdominal: Soft. Bowel sounds are normal. He exhibits no distension. There is no tenderness.  Musculoskeletal: Normal range of motion. He exhibits no edema or tenderness.  Neurological: He is alert and oriented to person, place, and time. He has normal reflexes. No cranial nerve deficit.  Skin: Skin is warm and dry. No rash noted. No erythema.  Psychiatric: He has a normal mood and affect. His behavior is normal. Judgment and thought content normal.  Vitals reviewed.    BP (!) 157/96   Pulse 61   Temp (!) 96.8 F (36 C) (Oral)   Ht 5' 10"  (1.778 m)   Wt 200 lb 12.8 oz (91.1 kg)   BMI 28.81 kg/m       Assessment &  Plan:  1. 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 Acute on chronic systolic CHF (congestive heart failure) ([place]) - CMP14+EGFR - Ambulatory referral to Cardiology  3. Atrial fibrillation with RVR ([address]) - CMP14+EGFR - Ambulatory referral to Cardiology - CoaguChek XS/INR Waived  4. COPD exacerbation ([place]) -Smoking cessation discussed - CMP14+EGFR - DG Chest 2 View; Future  5. Smoking -Smoking cessation discussed - CMP14+EGFR - DG Chest 2 View; Future  6. Type 2 diabetes mellitus with hyperglycemia, without long-term current use of insulin (HCC) -HgbA1C last year was 6.7, pt has not been on medication. Will recheck and if elevated will add metformin. Blood glucose on 04/13/16 was 111. - Bayer DCA Hb A1c Waived - CMP14+EGFR - Lipid panel - Ambulatory referral to Ophthalmology - Microalbumin / creatinine urine ratio  7. Need for hepatitis C screening test - CMP14+EGFR - Hepatitis C antibody  8. Colon cancer screening - Fecal occult blood, imunochemical; [place]  9. Essential hypertension, benign -PT states he has not taken BP meds in the last two days because he forgot -Dash diet information discussed -Exercise encouraged - Stress Management  -Continue current meds everyday!!!! -RTO in 2 weeks n  Continue all meds Labs pending Health Maintenance reviewed Diet and exercise encouraged RTO 2 weeks to rehcheck HTN. PT to take medications before visit and take daily!!  [name], FNP

## 2016-04-28 ENCOUNTER — Other Ambulatory Visit: Payer: Self-pay | Admitting: Family

## 2016-04-28 LAB — CMP14+EGFR
A/G RATIO: 1.1 — AB (ref 1.2–2.2)
ALBUMIN: 4 g/dL (ref 3.6–4.8)
ALT: 19 IU/L (ref 0–44)
AST: 27 IU/L (ref 0–40)
Alkaline Phosphatase: 66 IU/L (ref 39–117)
BUN / CREAT RATIO: 13 (ref 10–24)
BUN: 12 mg/dL (ref 8–27)
Bilirubin Total: 0.6 mg/dL (ref 0.0–1.2)
CALCIUM: 9.4 mg/dL (ref 8.6–10.2)
CO2: 28 mmol/L (ref 18–29)
Chloride: 96 mmol/L (ref 96–106)
Creatinine, Ser: 0.93 mg/dL (ref 0.76–1.27)
GFR calc Af Amer: 101 mL/min/{1.73_m2} (ref >59)
GFR, EST NON AFRICAN AMERICAN: 88 mL/min/{1.73_m2} (ref >59)
GLOBULIN, TOTAL: 3.6 g/dL (ref 1.5–4.5)
Glucose: 104 mg/dL — ABNORMAL HIGH (ref 65–99)
POTASSIUM: 4.3 mmol/L (ref 3.5–5.2)
SODIUM: 141 mmol/L (ref 134–144)
Total Protein: 7.6 g/dL (ref 6.0–8.5)

## 2016-04-28 LAB — LIPID PANEL
CHOL/HDL RATIO: 4.4 ratio (ref 0.0–5.0)
Cholesterol, Total: 278 mg/dL — ABNORMAL HIGH (ref 100–199)
HDL: 63 mg/dL (ref >39)
LDL CALC: 138 mg/dL — AB (ref 0–99)
TRIGLYCERIDES: 384 mg/dL — AB (ref 0–149)
VLDL Cholesterol Cal: 77 mg/dL — ABNORMAL HIGH (ref 5–40)

## 2016-04-28 LAB — HEPATITIS C ANTIBODY

## 2016-04-28 LAB — MICROALBUMIN / CREATININE URINE RATIO
CREATININE, UR: 89.5 mg/dL
MICROALB/CREAT RATIO: 34 mg/g{creat} — AB (ref 0.0–30.0)
MICROALBUM., U, RANDOM: 30.4 ug/mL

## 2016-04-28 MED ORDER — ROSUVASTATIN CALCIUM 20 MG PO TABS: 20.0000 mg | ORAL_TABLET | Freq: Every day | ORAL | 3 refills | Status: DC

## 2016-05-24 ENCOUNTER — Ambulatory Visit (INDEPENDENT_AMBULATORY_CARE_PROVIDER_SITE_OTHER): Payer: BLUE CROSS/BLUE SHIELD | Admitting: Pharmacist

## 2016-05-24 DIAGNOSIS — I4891 Unspecified atrial fibrillation: Secondary | ICD-10-CM

## 2016-05-24 DIAGNOSIS — R7303 Prediabetes: Secondary | ICD-10-CM | POA: Insufficient documentation

## 2016-05-24 DIAGNOSIS — I2510 Atherosclerotic heart disease of native coronary artery without angina pectoris: Secondary | ICD-10-CM

## 2016-05-24 LAB — COAGUCHEK XS/INR WAIVED
INR: 3 — ABNORMAL HIGH (ref 0.9–1.1)
Prothrombin Time: 36.1 s

## 2016-05-24 MED ORDER — WARFARIN SODIUM 5 MG PO TABS: ORAL_TABLET | ORAL | 1 refills | Status: DC

## 2016-05-24 MED ORDER — METOPROLOL SUCCINATE ER 50 MG PO TB24: 50.0000 mg | ORAL_TABLET | Freq: Every day | ORAL | 1 refills | Status: DC

## 2016-06-14 ENCOUNTER — Ambulatory Visit: Payer: BLUE CROSS/BLUE SHIELD | Admitting: Cardiology

## 2016-06-15 ENCOUNTER — Encounter: Payer: Self-pay | Admitting: Cardiology

## 2016-06-21 ENCOUNTER — Ambulatory Visit: Payer: BLUE CROSS/BLUE SHIELD | Admitting: Cardiology

## 2016-06-21 ENCOUNTER — Encounter: Payer: Self-pay | Admitting: Pharmacist

## 2016-06-28 ENCOUNTER — Encounter: Payer: Self-pay | Admitting: Pharmacist

## 2016-06-29 ENCOUNTER — Encounter: Payer: Self-pay | Admitting: Family

## 2016-07-07 ENCOUNTER — Encounter: Payer: Self-pay | Admitting: Pharmacist

## 2016-07-07 ENCOUNTER — Ambulatory Visit (INDEPENDENT_AMBULATORY_CARE_PROVIDER_SITE_OTHER): Payer: BLUE CROSS/BLUE SHIELD | Admitting: Pharmacist

## 2016-07-07 ENCOUNTER — Encounter (INDEPENDENT_AMBULATORY_CARE_PROVIDER_SITE_OTHER): Payer: Self-pay

## 2016-07-07 DIAGNOSIS — I4891 Unspecified atrial fibrillation: Secondary | ICD-10-CM

## 2016-07-07 LAB — COAGUCHEK XS/INR WAIVED
INR: 2.7 — AB (ref 0.9–1.1)
PROTHROMBIN TIME: 32.9 s

## 2016-08-10 ENCOUNTER — Encounter: Payer: Self-pay | Admitting: Pharmacist

## 2016-08-14 ENCOUNTER — Ambulatory Visit (INDEPENDENT_AMBULATORY_CARE_PROVIDER_SITE_OTHER): Payer: BLUE CROSS/BLUE SHIELD | Admitting: Pharmacist

## 2016-08-14 DIAGNOSIS — I4891 Unspecified atrial fibrillation: Secondary | ICD-10-CM | POA: Diagnosis not present

## 2016-08-14 DIAGNOSIS — I2510 Atherosclerotic heart disease of native coronary artery without angina pectoris: Secondary | ICD-10-CM

## 2016-08-14 LAB — COAGUCHEK XS/INR WAIVED
INR: 4.5 — ABNORMAL HIGH (ref 0.9–1.1)
Prothrombin Time: 54.2 s

## 2016-08-14 MED ORDER — WARFARIN SODIUM 5 MG PO TABS: ORAL_TABLET | ORAL | 1 refills | Status: DC

## 2016-08-14 MED ORDER — METOPROLOL SUCCINATE ER 50 MG PO TB24: 50.0000 mg | ORAL_TABLET | Freq: Every day | ORAL | 1 refills | Status: DC

## 2016-08-28 ENCOUNTER — Ambulatory Visit: Payer: Self-pay | Admitting: Family

## 2016-08-29 ENCOUNTER — Encounter: Payer: Self-pay | Admitting: Family

## 2016-08-29 ENCOUNTER — Telehealth: Payer: Self-pay | Admitting: Family

## 2016-08-29 NOTE — Telephone Encounter (Signed)
Aware, missed appointment and does not want to schedule at this time.

## 2016-09-15 ENCOUNTER — Telehealth: Payer: Self-pay | Admitting: *Deleted

## 2016-09-15 ENCOUNTER — Ambulatory Visit (INDEPENDENT_AMBULATORY_CARE_PROVIDER_SITE_OTHER): Payer: BLUE CROSS/BLUE SHIELD | Admitting: Pharmacist

## 2016-09-15 DIAGNOSIS — I4891 Unspecified atrial fibrillation: Secondary | ICD-10-CM

## 2016-09-15 DIAGNOSIS — I2510 Atherosclerotic heart disease of native coronary artery without angina pectoris: Secondary | ICD-10-CM

## 2016-09-15 LAB — COAGUCHEK XS/INR WAIVED
INR: 3.2 — AB (ref 0.9–1.1)
PROTHROMBIN TIME: 38.8 s

## 2016-09-15 MED ORDER — WARFARIN SODIUM 5 MG PO TABS: ORAL_TABLET | ORAL | 1 refills | Status: DC

## 2016-09-15 MED ORDER — METOPROLOL SUCCINATE ER 50 MG PO TB24: 50.0000 mg | ORAL_TABLET | Freq: Every day | ORAL | 1 refills | Status: DC

## 2016-09-15 NOTE — Telephone Encounter (Signed)
Needs to make follow-up appt

## 2016-09-15 NOTE — Telephone Encounter (Signed)
lmtcb jkop 3/9

## 2016-09-15 NOTE — Patient Instructions (Signed)
Anticoagulation Dose Instructions as of 09/15/2016      Glynis SmilesSun Mon Tue Wed Thu Fri Sat   New Dose 10 mg 7.5 mg 7.5 mg 7.5 mg 7.5 mg 10 mg 7.5 mg    Description   Take 1.5 tablets today instead of 2 tablets. Then continue to take 2 tablets on Sundays and Fridays only, and take 1 and 1/2 tablet all other days.

## 2016-09-15 NOTE — Telephone Encounter (Signed)
Patient seen in pharm D clinic today.  He was a no show for appointment in February with provider.  Requesting refills on metoprolol and warfarin.  Please advise.

## 2016-09-19 NOTE — Telephone Encounter (Signed)
Called patient to schedule appointment.  He is unsure of his work schedule and what days he will be available.  He will call us back to make an appointment.

## 2016-10-12 ENCOUNTER — Telehealth: Payer: Self-pay | Admitting: Pharmacist

## 2016-10-12 NOTE — Telephone Encounter (Signed)
Patient is past due INR checked.  Needs appt.  Tried to call but no answer.

## 2016-10-26 ENCOUNTER — Ambulatory Visit (INDEPENDENT_AMBULATORY_CARE_PROVIDER_SITE_OTHER): Payer: BLUE CROSS/BLUE SHIELD | Admitting: Pharmacist

## 2016-10-26 DIAGNOSIS — I4891 Unspecified atrial fibrillation: Secondary | ICD-10-CM

## 2016-10-26 DIAGNOSIS — I2510 Atherosclerotic heart disease of native coronary artery without angina pectoris: Secondary | ICD-10-CM

## 2016-10-26 LAB — COAGUCHEK XS/INR WAIVED
INR: 4.3 — ABNORMAL HIGH (ref 0.9–1.1)
PROTHROMBIN TIME: 51.4 s

## 2016-10-26 MED ORDER — WARFARIN SODIUM 5 MG PO TABS
ORAL_TABLET | ORAL | 1 refills | Status: DC
Start: 2016-10-26 — End: 2016-12-01

## 2016-10-26 MED ORDER — METOPROLOL SUCCINATE ER 50 MG PO TB24: 50.0000 mg | ORAL_TABLET | Freq: Every day | ORAL | 0 refills | Status: DC

## 2016-11-07 ENCOUNTER — Encounter: Payer: Self-pay | Admitting: Pharmacist

## 2016-11-08 ENCOUNTER — Encounter: Payer: Self-pay | Admitting: Family

## 2016-11-24 ENCOUNTER — Telehealth: Payer: Self-pay | Admitting: Pharmacist

## 2016-11-24 NOTE — Telephone Encounter (Signed)
Left message with mother.

## 2016-12-01 ENCOUNTER — Encounter: Payer: BLUE CROSS/BLUE SHIELD | Admitting: Pharmacist Clinician (PhC)/ Clinical Pharmacy Specialist

## 2016-12-01 ENCOUNTER — Ambulatory Visit (INDEPENDENT_AMBULATORY_CARE_PROVIDER_SITE_OTHER): Payer: BLUE CROSS/BLUE SHIELD | Admitting: Pharmacist

## 2016-12-01 DIAGNOSIS — I2510 Atherosclerotic heart disease of native coronary artery without angina pectoris: Secondary | ICD-10-CM

## 2016-12-01 DIAGNOSIS — I4891 Unspecified atrial fibrillation: Secondary | ICD-10-CM | POA: Diagnosis not present

## 2016-12-01 LAB — COAGUCHEK XS/INR WAIVED
INR: 2.6 — ABNORMAL HIGH (ref 0.9–1.1)
PROTHROMBIN TIME: 31.1 s

## 2016-12-01 MED ORDER — WARFARIN SODIUM 5 MG PO TABS: ORAL_TABLET | ORAL | 0 refills | Status: DC

## 2016-12-01 MED ORDER — METOPROLOL SUCCINATE ER 50 MG PO TB24: 50.0000 mg | ORAL_TABLET | Freq: Every day | ORAL | 0 refills | Status: DC

## 2017-01-04 ENCOUNTER — Ambulatory Visit (INDEPENDENT_AMBULATORY_CARE_PROVIDER_SITE_OTHER): Payer: BLUE CROSS/BLUE SHIELD | Admitting: Family

## 2017-01-04 ENCOUNTER — Encounter: Payer: Self-pay | Admitting: Family

## 2017-01-04 VITALS — BP 169/104 | HR 68 | Temp 97.6°F | Ht 70.0 in | Wt 192.4 lb

## 2017-01-04 DIAGNOSIS — Z1211 Encounter for screening for malignant neoplasm of colon: Secondary | ICD-10-CM

## 2017-01-04 DIAGNOSIS — J441 Chronic obstructive pulmonary disease with (acute) exacerbation: Secondary | ICD-10-CM | POA: Diagnosis not present

## 2017-01-04 DIAGNOSIS — E785 Hyperlipidemia, unspecified: Secondary | ICD-10-CM | POA: Insufficient documentation

## 2017-01-04 DIAGNOSIS — Z720 Tobacco use: Secondary | ICD-10-CM

## 2017-01-04 DIAGNOSIS — I4891 Unspecified atrial fibrillation: Secondary | ICD-10-CM

## 2017-01-04 DIAGNOSIS — I2510 Atherosclerotic heart disease of native coronary artery without angina pectoris: Secondary | ICD-10-CM | POA: Diagnosis not present

## 2017-01-04 DIAGNOSIS — I10 Essential (primary) hypertension: Secondary | ICD-10-CM | POA: Diagnosis not present

## 2017-01-04 LAB — COAGUCHEK XS/INR WAIVED
INR: 3.9 — ABNORMAL HIGH (ref 0.9–1.1)
Prothrombin Time: 46.7 s

## 2017-01-04 MED ORDER — METOPROLOL SUCCINATE ER 50 MG PO TB24: 50.0000 mg | ORAL_TABLET | Freq: Every day | ORAL | 1 refills | Status: DC

## 2017-01-04 MED ORDER — LISINOPRIL 20 MG PO TABS: 20.0000 mg | ORAL_TABLET | Freq: Every day | ORAL | 3 refills | Status: DC

## 2017-01-04 NOTE — Patient Instructions (Signed)

## 2017-01-04 NOTE — Progress Notes (Signed)
Subjective:    Patient ID: Jason Spencer, male    DOB: 1951-03-20, 66 y.o.   MRN: [id number]  Pt presents to the office today for chronic follow up. Pt has seen a Cardiologists in 03/2015. PT  Has CHF, A Fib, and CAD. Hypertension  This is a chronic problem. The current episode started more than 1 year ago. The problem has been waxing and waning since onset. The problem is uncontrolled. Pertinent negatives include no headaches, palpitations, peripheral edema or shortness of breath. Risk factors for coronary artery disease include dyslipidemia, obesity, male gender, smoking/tobacco exposure, sedentary lifestyle and family history. Treatments tried: Pt has been out of his metoprolol for several days. The current treatment provides no improvement. Hypertensive end-organ damage includes CAD/MI and heart failure. There is no history of kidney disease or CVA.  COPD Pt currently smoking 1/2 pack a day. Pt does not use any inhalers.  A Fib Pt is currently warfarin. States he is wheezing and can tell his breathing is worse.    Review of Systems  Respiratory: Positive for cough and wheezing. Negative for shortness of breath.   Cardiovascular: Negative for palpitations.  Neurological: Negative for headaches.  All other systems reviewed and are negative.      Objective:   Physical Exam  Constitutional: He is oriented to person, place, and time. He appears well-developed and well-nourished. No distress.  HENT:  Head: Normocephalic.  Right Ear: External ear normal.  Left Ear: External ear normal.  Nose: Rhinorrhea present.  Mouth/Throat: Posterior oropharyngeal erythema present.  Eyes: Pupils are equal, round, and reactive to light. Right eye exhibits no discharge. Left eye exhibits no discharge.  Neck: Normal range of motion. Neck supple. No thyromegaly present.  Cardiovascular: Normal rate, regular rhythm, normal heart sounds and intact distal pulses.   No murmur heard. Pulmonary/Chest:  Effort normal. No respiratory distress. He has decreased breath sounds. He has rhonchi.  Abdominal: Soft. Bowel sounds are normal. He exhibits no distension. There is no tenderness.  Musculoskeletal: Normal range of motion. He exhibits no edema or tenderness.  Neurological: He is alert and oriented to person, place, and time. He has normal reflexes. No cranial nerve deficit.  Skin: Skin is warm and dry. No rash noted. No erythema.  Psychiatric: He has a normal mood and affect. His behavior is normal. Judgment and thought content normal.  Vitals reviewed.    BP (!) 169/104   Pulse 68   Temp 97.6 F (36.4 C) (Oral)   Ht 5' 10"  (1.778 m)   Wt 192 lb 6.4 oz (87.3 kg)   BMI 27.61 kg/m      Assessment & Plan:  1. Essential hypertension, benign Lisinopril increased to 20 mg and metoprolol reordered today -Dash diet information given -Exercise encouraged - Stress Management  -Continue current meds -RTO in 2 weeks  - CMP14+EGFR - metoprolol succinate (TOPROL-XL) 50 MG 24 hr tablet; Take 1 tablet (50 mg total) by mouth daily. Take with or immediately following a meal.  Dispense: 90 tablet; Refill: 1 - lisinopril (PRINIVIL,ZESTRIL) 20 MG tablet; Take 1 tablet (20 mg total) by mouth daily.  Dispense: 90 tablet; Refill: 3  2. Coronary artery disease involving native coronary artery of native heart without angina pectoris - CMP14+EGFR - Lipid panel - Ambulatory referral to Cardiology  3. Atrial fibrillation with RVR ([place]) - CMP14+EGFR - CoaguChek XS/INR Waived - Ambulatory referral to Cardiology Anticoagulation Warfarin Dose Instructions as of 01/04/2017      Dorene Grebe Tue  Wed Thu Fri Sat   New Dose 5 mg 7.5 mg 5 mg 5 mg 5 mg 7.5 mg 5 mg    Description   Hold today's dose 01/04/17 then take  warfarin dose - take 7.47m or 1 and 1/2 tablets on mondays,and fridays.  Take 579mor 1 tablet all other days.  INR today is 3.9 (goal: 2-3)      4. COPD exacerbation (HCC) - CMP14+EGFR  5.  Tobacco abuse - CMP14+EGFR - Ambulatory referral to Cardiology  6. Hyperlipidemia, unspecified hyperlipidemia type - CMP14+EGFR - Lipid panel - Ambulatory referral to Cardiology  7. Colon cancer screening - CMP14+EGFR -Fecal occult blood,  Continue all meds Labs pending Health Maintenance reviewed Diet and exercise encouraged RTO 2 weeks to recheck HTN  ChEvelina DunFNP

## 2017-01-05 ENCOUNTER — Other Ambulatory Visit: Payer: Self-pay | Admitting: Family

## 2017-01-05 ENCOUNTER — Ambulatory Visit: Payer: Self-pay | Admitting: Family

## 2017-01-05 LAB — CMP14+EGFR
A/G RATIO: 1.1 — AB (ref 1.2–2.2)
ALK PHOS: 61 IU/L (ref 39–117)
ALT: 18 IU/L (ref 0–44)
AST: 31 IU/L (ref 0–40)
Albumin: 3.9 g/dL (ref 3.6–4.8)
BUN/Creatinine Ratio: 12 (ref 10–24)
BUN: 11 mg/dL (ref 8–27)
Bilirubin Total: 0.7 mg/dL (ref 0.0–1.2)
CHLORIDE: 100 mmol/L (ref 96–106)
CO2: 24 mmol/L (ref 20–29)
Calcium: 9.2 mg/dL (ref 8.6–10.2)
Creatinine, Ser: 0.89 mg/dL (ref 0.76–1.27)
GFR calc Af Amer: 105 mL/min/{1.73_m2} (ref >59)
GFR calc non Af Amer: 91 mL/min/{1.73_m2} (ref >59)
GLUCOSE: 97 mg/dL (ref 65–99)
Globulin, Total: 3.5 g/dL (ref 1.5–4.5)
POTASSIUM: 4.4 mmol/L (ref 3.5–5.2)
Sodium: 137 mmol/L (ref 134–144)
Total Protein: 7.4 g/dL (ref 6.0–8.5)

## 2017-01-05 LAB — LIPID PANEL
Chol/HDL Ratio: 3.9 ratio (ref 0.0–5.0)
Cholesterol, Total: 257 mg/dL — ABNORMAL HIGH (ref 100–199)
HDL: 66 mg/dL (ref >39)
LDL Calculated: 158 mg/dL — ABNORMAL HIGH (ref 0–99)
TRIGLYCERIDES: 167 mg/dL — AB (ref 0–149)
VLDL CHOLESTEROL CAL: 33 mg/dL (ref 5–40)

## 2017-01-05 MED ORDER — ROSUVASTATIN CALCIUM 20 MG PO TABS: 20.0000 mg | ORAL_TABLET | Freq: Every day | ORAL | 3 refills | Status: DC

## 2017-01-22 ENCOUNTER — Telehealth: Payer: Self-pay | Admitting: Pharmacist

## 2017-01-22 NOTE — Telephone Encounter (Signed)
Patient is past due to have INR rechecked  Tried to call but number was busy

## 2017-02-16 ENCOUNTER — Telehealth: Payer: Self-pay | Admitting: Pharmacist

## 2017-02-16 NOTE — Telephone Encounter (Signed)
INR past due - left message with sister for patient to call for appt

## 2017-02-23 ENCOUNTER — Ambulatory Visit (INDEPENDENT_AMBULATORY_CARE_PROVIDER_SITE_OTHER): Payer: BLUE CROSS/BLUE SHIELD | Admitting: Pharmacist Clinician (PhC)/ Clinical Pharmacy Specialist

## 2017-02-23 DIAGNOSIS — I4891 Unspecified atrial fibrillation: Secondary | ICD-10-CM | POA: Diagnosis not present

## 2017-02-23 DIAGNOSIS — I2510 Atherosclerotic heart disease of native coronary artery without angina pectoris: Secondary | ICD-10-CM | POA: Diagnosis not present

## 2017-02-23 DIAGNOSIS — I10 Essential (primary) hypertension: Secondary | ICD-10-CM

## 2017-02-23 LAB — COAGUCHEK XS/INR WAIVED
INR: 2.5 — AB (ref 0.9–1.1)
Prothrombin Time: 29.8 s

## 2017-02-23 MED ORDER — METOPROLOL SUCCINATE ER 50 MG PO TB24: 50.0000 mg | ORAL_TABLET | Freq: Every day | ORAL | 3 refills | Status: DC

## 2017-02-23 MED ORDER — WARFARIN SODIUM 5 MG PO TABS: ORAL_TABLET | ORAL | 3 refills | Status: DC

## 2017-02-23 NOTE — Patient Instructions (Signed)
Anticoagulation Warfarin Dose Instructions as of 02/23/2017      Glynis Smiles Tue Wed Thu Fri Sat   New Dose 5 mg 7.5 mg 5 mg 5 mg 5 mg 7.5 mg 5 mg    Description   Continue taking coumadin the same way.  INR today is 2.5  (goal 2-3)

## 2017-04-06 ENCOUNTER — Encounter (INDEPENDENT_AMBULATORY_CARE_PROVIDER_SITE_OTHER): Payer: Self-pay

## 2017-04-06 ENCOUNTER — Ambulatory Visit (INDEPENDENT_AMBULATORY_CARE_PROVIDER_SITE_OTHER): Payer: BLUE CROSS/BLUE SHIELD | Admitting: Pharmacist Clinician (PhC)/ Clinical Pharmacy Specialist

## 2017-04-06 DIAGNOSIS — I4891 Unspecified atrial fibrillation: Secondary | ICD-10-CM | POA: Diagnosis not present

## 2017-04-06 DIAGNOSIS — I10 Essential (primary) hypertension: Secondary | ICD-10-CM | POA: Diagnosis not present

## 2017-04-06 LAB — COAGUCHEK XS/INR WAIVED
INR: 3.1 — AB (ref 0.9–1.1)
PROTHROMBIN TIME: 37.3 s

## 2017-04-06 MED ORDER — METOPROLOL SUCCINATE ER 50 MG PO TB24: 50.0000 mg | ORAL_TABLET | Freq: Every day | ORAL | 3 refills | Status: DC

## 2017-04-06 NOTE — Patient Instructions (Signed)
Anticoagulation Warfarin Dose Instructions as of 04/06/2017      Glynis Smiles Tue Wed Thu Fri Sat   New Dose 5 mg 7.5 mg 5 mg 5 mg 5 mg 5 mg 5 mg    Description   Change coumadin directions to 1 tablet a day except on Mondays take 1 1/2 tablets.  INR today is 3.1  (goal 2-3)

## 2017-05-25 ENCOUNTER — Encounter: Payer: Self-pay | Admitting: Pharmacist Clinician (PhC)/ Clinical Pharmacy Specialist

## 2017-06-05 ENCOUNTER — Encounter: Payer: Self-pay | Admitting: Family

## 2017-10-05 ENCOUNTER — Ambulatory Visit: Payer: BLUE CROSS/BLUE SHIELD | Admitting: Pharmacist Clinician (PhC)/ Clinical Pharmacy Specialist

## 2017-10-05 DIAGNOSIS — I4891 Unspecified atrial fibrillation: Secondary | ICD-10-CM

## 2017-10-05 LAB — COAGUCHEK XS/INR WAIVED
INR: 2.5 — ABNORMAL HIGH (ref 0.9–1.1)
PROTHROMBIN TIME: 29.9 s

## 2017-10-05 NOTE — Patient Instructions (Signed)
Description   Continue taking it the same way  INR today is 2.5  (goal 2-3)

## 2017-10-23 ENCOUNTER — Other Ambulatory Visit: Payer: Self-pay | Admitting: Family

## 2017-10-23 DIAGNOSIS — I2510 Atherosclerotic heart disease of native coronary artery without angina pectoris: Secondary | ICD-10-CM

## 2017-10-23 DIAGNOSIS — I4891 Unspecified atrial fibrillation: Secondary | ICD-10-CM

## 2017-10-25 ENCOUNTER — Other Ambulatory Visit: Payer: Self-pay | Admitting: *Deleted

## 2017-10-25 DIAGNOSIS — I4891 Unspecified atrial fibrillation: Secondary | ICD-10-CM

## 2017-10-25 DIAGNOSIS — I2510 Atherosclerotic heart disease of native coronary artery without angina pectoris: Secondary | ICD-10-CM

## 2017-10-25 MED ORDER — WARFARIN SODIUM 5 MG PO TABS: ORAL_TABLET | ORAL | 3 refills | Status: DC

## 2017-10-25 NOTE — Telephone Encounter (Signed)
Prescription sent to pharmacy.

## 2017-12-07 ENCOUNTER — Ambulatory Visit: Payer: BLUE CROSS/BLUE SHIELD | Admitting: Pharmacist Clinician (PhC)/ Clinical Pharmacy Specialist

## 2017-12-07 DIAGNOSIS — I10 Essential (primary) hypertension: Secondary | ICD-10-CM

## 2017-12-07 DIAGNOSIS — I4891 Unspecified atrial fibrillation: Secondary | ICD-10-CM | POA: Diagnosis not present

## 2017-12-07 LAB — COAGUCHEK XS/INR WAIVED
INR: 2.5 — AB (ref 0.9–1.1)
PROTHROMBIN TIME: 29.5 s

## 2017-12-07 MED ORDER — METOPROLOL SUCCINATE ER 50 MG PO TB24: 50.0000 mg | ORAL_TABLET | Freq: Every day | ORAL | 3 refills | Status: DC

## 2017-12-07 NOTE — Patient Instructions (Signed)
Description   Continue taking it the same way  INR today is 2.5  (goal 2-3)

## 2018-01-14 ENCOUNTER — Other Ambulatory Visit: Payer: Self-pay | Admitting: Family

## 2018-01-14 DIAGNOSIS — I10 Essential (primary) hypertension: Secondary | ICD-10-CM

## 2018-01-16 ENCOUNTER — Telehealth: Payer: Self-pay | Admitting: Family

## 2018-01-16 NOTE — Telephone Encounter (Signed)
What is the name of the medication? Lisinopril 20 mg  Have you contacted your pharmacy to request a refill? YES  Which pharmacy would you [facility] this sent to? [facility]   Patient notified that their request is being sent to the clinical staff for review and that they should receive a call once it is complete. If they do not receive a call within 24 hours they can check with their pharmacy or our office.

## 2018-01-17 NOTE — Telephone Encounter (Addendum)
NA, pt NTBS last OV 01/04/17 with PCP

## 2018-01-23 NOTE — Telephone Encounter (Signed)
No call back - this encounter will be closed.  

## 2018-01-25 ENCOUNTER — Ambulatory Visit: Payer: BLUE CROSS/BLUE SHIELD | Admitting: Family

## 2018-01-25 ENCOUNTER — Encounter: Payer: Self-pay | Admitting: Family

## 2018-01-25 VITALS — BP 150/93 | HR 72 | Temp 97.9°F | Ht 70.0 in | Wt 198.6 lb

## 2018-01-25 DIAGNOSIS — Z0000 Encounter for general adult medical examination without abnormal findings: Secondary | ICD-10-CM | POA: Diagnosis not present

## 2018-01-25 DIAGNOSIS — Z1212 Encounter for screening for malignant neoplasm of rectum: Secondary | ICD-10-CM

## 2018-01-25 DIAGNOSIS — E785 Hyperlipidemia, unspecified: Secondary | ICD-10-CM

## 2018-01-25 DIAGNOSIS — J449 Chronic obstructive pulmonary disease, unspecified: Secondary | ICD-10-CM

## 2018-01-25 DIAGNOSIS — I4891 Unspecified atrial fibrillation: Secondary | ICD-10-CM

## 2018-01-25 DIAGNOSIS — I5023 Acute on chronic systolic (congestive) heart failure: Secondary | ICD-10-CM

## 2018-01-25 DIAGNOSIS — Z720 Tobacco use: Secondary | ICD-10-CM

## 2018-01-25 DIAGNOSIS — I10 Essential (primary) hypertension: Secondary | ICD-10-CM

## 2018-01-25 DIAGNOSIS — I2510 Atherosclerotic heart disease of native coronary artery without angina pectoris: Secondary | ICD-10-CM

## 2018-01-25 DIAGNOSIS — Z1211 Encounter for screening for malignant neoplasm of colon: Secondary | ICD-10-CM

## 2018-01-25 LAB — COAGUCHEK XS/INR WAIVED
INR: 2.3 — ABNORMAL HIGH (ref 0.9–1.1)
Prothrombin Time: 27.6 s

## 2018-01-25 MED ORDER — LISINOPRIL 40 MG PO TABS: 40.0000 mg | ORAL_TABLET | Freq: Every day | ORAL | 3 refills | Status: DC

## 2018-01-25 MED ORDER — FLUTICASONE FUROATE-VILANTEROL 100-25 MCG/INH IN AEPB: 1.0000 | INHALATION_SPRAY | Freq: Every day | RESPIRATORY_TRACT | 3 refills | Status: DC

## 2018-01-25 MED ORDER — METOPROLOL SUCCINATE ER 50 MG PO TB24: 50.0000 mg | ORAL_TABLET | Freq: Every day | ORAL | 3 refills | Status: DC

## 2018-01-25 MED ORDER — ROSUVASTATIN CALCIUM 20 MG PO TABS: 20.0000 mg | ORAL_TABLET | Freq: Every day | ORAL | 3 refills | Status: DC

## 2018-01-25 NOTE — Progress Notes (Signed)
Subjective:    Patient ID: Jason Spencer, male    DOB: Nov 20, 1950, 67 y.o.   MRN: [id number]  Chief Complaint  Patient presents with  . Medical Management of Chronic Issues    lab work, protime   PT presents to the office today for CPE. Pt has CAD and A Fib. He takes warfarin daily, but has not seen a Cardiologists in years.  Hypertension  This is a chronic problem. The current episode started more than 1 year ago. The problem is uncontrolled. Associated symptoms include malaise/fatigue. Pertinent negatives include no peripheral edema or shortness of breath. Risk factors for coronary artery disease include dyslipidemia, diabetes mellitus, obesity, male gender and smoking/tobacco exposure. The current treatment provides moderate improvement. Hypertensive end-organ damage includes CAD/MI and heart failure.  Hyperlipidemia  This is a chronic problem. The current episode started more than 1 year ago. The problem is uncontrolled. Recent lipid tests were reviewed and are high. Exacerbating diseases include obesity. Pertinent negatives include no shortness of breath. He is currently on no antihyperlipidemic treatment. The current treatment provides no improvement of lipids. Risk factors for coronary artery disease include dyslipidemia, diabetes mellitus, hypertension, male sex, obesity and a sedentary lifestyle.  COPD Pt continues to smoke, but states he is "cutting back". Less than 1/2 pack a day. Reports he is noticing wheezing.     Review of Systems  Constitutional: Positive for malaise/fatigue.  Respiratory: Negative for shortness of breath.   All other systems reviewed and are negative.      Objective:   Physical Exam  Constitutional: He is oriented to person, place, and time. He appears well-developed and well-nourished. No distress.  HENT:  Head: Normocephalic.  Right Ear: External ear normal.  Left Ear: External ear normal.  Mouth/Throat: Oropharynx is clear and moist.  Eyes:  Pupils are equal, round, and reactive to light. Right eye exhibits no discharge. Left eye exhibits no discharge.  Neck: Normal range of motion. Neck supple. No thyromegaly present.  Cardiovascular: Normal rate, regular rhythm, normal heart sounds and intact distal pulses.  No murmur heard. Pulmonary/Chest: Effort normal and breath sounds normal. No respiratory distress. He has no wheezes.  Abdominal: Soft. Bowel sounds are normal. He exhibits no distension. There is no tenderness.  Musculoskeletal: Normal range of motion. He exhibits no edema or tenderness.  Neurological: He is alert and oriented to person, place, and time. He has normal reflexes. No cranial nerve deficit.  Skin: Skin is warm and dry. No rash noted. No erythema.  Psychiatric: He has a normal mood and affect. His behavior is normal. Judgment and thought content normal.  Vitals reviewed.   BP (!) 150/93   Pulse 72   Temp 97.9 F (36.6 C) (Oral)   Ht _0  (1.778 m)   Wt 198 lb 9.6 oz (90.1 kg)   BMI 28.50 kg/m      Assessment & Plan:  Jason Spencer comes in today with chief complaint of Medical Management of Chronic Issues (lab work, protime)   Diagnosis and orders addressed:  1. Annual physical exam - CBC with Differential/Platelet - CMP14+EGFR - Lipid panel - TSH - PSA, total and free - Cologuard  2. Atrial fibrillation with RVR (HCC) Description   Continue taking 7.5 mg on Monday and 5 mg all other days.   INR today is 2.3  (goal 2-3)     - CoaguChek XS/INR Waived - metoprolol succinate (TOPROL-XL) 50 MG 24 hr tablet; Take 1 tablet (50 mg total) by  mouth daily. Take with or immediately following a meal.  Dispense: 90 tablet; Refill: 3 - CBC with Differential/Platelet - CMP14+EGFR - Ambulatory referral to Cardiology  3. Essential hypertension, benign - metoprolol succinate (TOPROL-XL) 50 MG 24 hr tablet; Take 1 tablet (50 mg total) by mouth daily. Take with or immediately following a meal.   Dispense: 90 tablet; Refill: 3 - CBC with Differential/Platelet - CMP14+EGFR - Ambulatory referral to Cardiology - lisinopril (PRINIVIL,ZESTRIL) 40 MG tablet; Take 1 tablet (40 mg total) by mouth daily.  Dispense: 90 tablet; Refill: 3  4. Acute on chronic systolic CHF (congestive heart failure) (HCC) - CBC with Differential/Platelet - CMP14+EGFR - Ambulatory referral to Cardiology  5. 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 Hyperlipidemia, unspecified hyperlipidemia type - rosuvastatin (CRESTOR) 20 MG tablet; Take 1 tablet (20 mg total) by mouth daily.  Dispense: 90 tablet; Refill: 3 - CBC with Differential/Platelet - CMP14+EGFR  7. Tobacco abuse - CBC with Differential/Platelet - CMP14+EGFR  8. Colon cancer screening - CBC with Differential/Platelet - CMP14+EGFR - Cologuard  9. Screening for malignant neoplasm of the rectum - CBC with Differential/Platelet - CMP14+EGFR - Cologuard  10. Chronic obstructive pulmonary disease, unspecified COPD type ([place]) Smoking cessation discussed   Labs pending Health Maintenance reviewed Diet and exercise encouraged  Follow up plan: 2 weeks    [name], FNP

## 2018-01-25 NOTE — Patient Instructions (Signed)

## 2018-01-25 NOTE — Addendum Note (Signed)
Addended by: Jannifer RodneyHAWKS, Glennie Bose A on: 01/25/2018 11:50 AM   Modules accepted: Orders

## 2018-01-26 LAB — CBC WITH DIFFERENTIAL/PLATELET
Basophils Absolute: 0 10*3/uL (ref 0.0–0.2)
Basos: 1 %
EOS (ABSOLUTE): 0.2 10*3/uL (ref 0.0–0.4)
Eos: 3 %
Hematocrit: 43.7 % (ref 37.5–51.0)
Hemoglobin: 14.7 g/dL (ref 13.0–17.7)
Immature Grans (Abs): 0.1 10*3/uL (ref 0.0–0.1)
Immature Granulocytes: 1 %
Lymphocytes Absolute: 1.3 10*3/uL (ref 0.7–3.1)
Lymphs: 17 %
MCH: 32.7 pg (ref 26.6–33.0)
MCHC: 33.6 g/dL (ref 31.5–35.7)
MCV: 97 fL (ref 79–97)
Monocytes Absolute: 1.1 10*3/uL — ABNORMAL HIGH (ref 0.1–0.9)
Monocytes: 15 %
Neutrophils Absolute: 4.8 10*3/uL (ref 1.4–7.0)
Neutrophils: 63 %
Platelets: 188 10*3/uL (ref 150–450)
RBC: 4.49 x10E6/uL (ref 4.14–5.80)
RDW: 12.8 % (ref 12.3–15.4)
WBC: 7.6 10*3/uL (ref 3.4–10.8)

## 2018-01-26 LAB — LIPID PANEL
CHOLESTEROL TOTAL: 231 mg/dL — AB (ref 100–199)
Chol/HDL Ratio: 3.9 ratio (ref 0.0–5.0)
HDL: 60 mg/dL (ref >39)
LDL CALC: 112 mg/dL — AB (ref 0–99)
TRIGLYCERIDES: 294 mg/dL — AB (ref 0–149)
VLDL CHOLESTEROL CAL: 59 mg/dL — AB (ref 5–40)

## 2018-01-26 LAB — CMP14+EGFR
ALBUMIN: 4 g/dL (ref 3.6–4.8)
ALK PHOS: 62 IU/L (ref 39–117)
ALT: 18 IU/L (ref 0–44)
AST: 26 IU/L (ref 0–40)
Albumin/Globulin Ratio: 1.1 — ABNORMAL LOW (ref 1.2–2.2)
BILIRUBIN TOTAL: 0.6 mg/dL (ref 0.0–1.2)
BUN / CREAT RATIO: 14 (ref 10–24)
BUN: 12 mg/dL (ref 8–27)
CHLORIDE: 99 mmol/L (ref 96–106)
CO2: 24 mmol/L (ref 20–29)
CREATININE: 0.84 mg/dL (ref 0.76–1.27)
Calcium: 9.1 mg/dL (ref 8.6–10.2)
GFR calc Af Amer: 107 mL/min/{1.73_m2} (ref >59)
GFR calc non Af Amer: 93 mL/min/{1.73_m2} (ref >59)
GLUCOSE: 76 mg/dL (ref 65–99)
Globulin, Total: 3.6 g/dL (ref 1.5–4.5)
Potassium: 4.5 mmol/L (ref 3.5–5.2)
Sodium: 137 mmol/L (ref 134–144)
Total Protein: 7.6 g/dL (ref 6.0–8.5)

## 2018-01-26 LAB — PSA, TOTAL AND FREE
PROSTATE SPECIFIC AG, SERUM: 0.7 ng/mL (ref 0.0–4.0)
PSA FREE PCT: 22.9 %
PSA, Free: 0.16 ng/mL

## 2018-01-26 LAB — TSH: TSH: 5.39 u[IU]/mL — ABNORMAL HIGH (ref 0.450–4.500)

## 2018-01-28 ENCOUNTER — Other Ambulatory Visit: Payer: Self-pay | Admitting: Family

## 2018-01-31 ENCOUNTER — Other Ambulatory Visit: Payer: Self-pay | Admitting: Family

## 2018-01-31 MED ORDER — LEVOTHYROXINE SODIUM 50 MCG PO TABS: 50.0000 ug | ORAL_TABLET | Freq: Every day | ORAL | 1 refills | Status: DC

## 2018-02-08 ENCOUNTER — Ambulatory Visit: Payer: BLUE CROSS/BLUE SHIELD | Admitting: Family

## 2018-02-08 ENCOUNTER — Encounter: Payer: Self-pay | Admitting: Family

## 2018-02-08 VITALS — BP 149/96 | HR 95 | Temp 97.5°F | Ht 70.0 in | Wt 197.6 lb

## 2018-02-08 DIAGNOSIS — Z720 Tobacco use: Secondary | ICD-10-CM

## 2018-02-08 DIAGNOSIS — I4891 Unspecified atrial fibrillation: Secondary | ICD-10-CM

## 2018-02-08 DIAGNOSIS — I2510 Atherosclerotic heart disease of native coronary artery without angina pectoris: Secondary | ICD-10-CM

## 2018-02-08 DIAGNOSIS — J449 Chronic obstructive pulmonary disease, unspecified: Secondary | ICD-10-CM

## 2018-02-08 DIAGNOSIS — E785 Hyperlipidemia, unspecified: Secondary | ICD-10-CM

## 2018-02-08 DIAGNOSIS — E039 Hypothyroidism, unspecified: Secondary | ICD-10-CM

## 2018-02-08 DIAGNOSIS — I10 Essential (primary) hypertension: Secondary | ICD-10-CM

## 2018-02-08 DIAGNOSIS — J441 Chronic obstructive pulmonary disease with (acute) exacerbation: Secondary | ICD-10-CM | POA: Diagnosis not present

## 2018-02-08 LAB — BMP8+EGFR
BUN/Creatinine Ratio: 16 (ref 10–24)
BUN: 15 mg/dL (ref 8–27)
CO2: 24 mmol/L (ref 20–29)
CREATININE: 0.92 mg/dL (ref 0.76–1.27)
Calcium: 9.2 mg/dL (ref 8.6–10.2)
Chloride: 101 mmol/L (ref 96–106)
GFR calc Af Amer: 101 mL/min/{1.73_m2} (ref >59)
GFR calc non Af Amer: 88 mL/min/{1.73_m2} (ref >59)
GLUCOSE: 90 mg/dL (ref 65–99)
Potassium: 4.3 mmol/L (ref 3.5–5.2)
SODIUM: 139 mmol/L (ref 134–144)

## 2018-02-08 MED ORDER — LEVOTHYROXINE SODIUM 50 MCG PO TABS: 50.0000 ug | ORAL_TABLET | Freq: Every day | ORAL | 1 refills | Status: DC

## 2018-02-08 MED ORDER — FLUTICASONE FUROATE-VILANTEROL 100-25 MCG/INH IN AEPB: 1.0000 | INHALATION_SPRAY | Freq: Every day | RESPIRATORY_TRACT | 3 refills | Status: DC

## 2018-02-08 MED ORDER — PREDNISONE 10 MG (21) PO TBPK: ORAL_TABLET | ORAL | 0 refills | Status: DC

## 2018-02-08 MED ORDER — METOPROLOL SUCCINATE ER 100 MG PO TB24: 100.0000 mg | ORAL_TABLET | Freq: Every day | ORAL | 1 refills | Status: DC

## 2018-02-08 MED ORDER — ROSUVASTATIN CALCIUM 20 MG PO TABS: 20.0000 mg | ORAL_TABLET | Freq: Every day | ORAL | 3 refills | Status: DC

## 2018-02-08 NOTE — Patient Instructions (Signed)

## 2018-02-08 NOTE — Progress Notes (Signed)
Subjective:    Patient ID: Jason Spencer, male    DOB: [birth year], 67 y.o.   MRN: [id number]  Chief Complaint  Patient presents with  . Hypertension    two week recheck   PT presents to the office today to recheck htn. Pt's BP is not at goal today. He states he has not started his levothyroxine yet but states he will. He also states he needs his Crestor refilled because he has not been taking this.  He states he has not followed up with Cardiologists yet.  Hypertension  This is a chronic problem. The current episode started more than 1 year ago. The problem has been waxing and waning since onset. The problem is uncontrolled. Pertinent negatives include no malaise/fatigue, peripheral edema or shortness of breath. Risk factors for coronary artery disease include dyslipidemia, obesity, male gender, smoking/tobacco exposure and family history. The current treatment provides no improvement. Hypertensive end-organ damage includes CAD/MI. There is no history of CVA or heart failure.  Wheezing   This is a recurrent problem. The current episode started in the past 7 days. The problem occurs intermittently. Pertinent negatives include no shortness of breath. The symptoms are aggravated by smoke. His past medical history is significant for COPD. There is no history of heart failure.      Review of Systems  Constitutional: Negative for malaise/fatigue.  Respiratory: Positive for wheezing. Negative for shortness of breath.   All other systems reviewed and are negative.      Objective:   Physical Exam  Constitutional: He is oriented to person, place, and time. He appears well-developed and well-nourished. No distress.  HENT:  Head: Normocephalic.  Right Ear: External ear normal.  Left Ear: External ear normal.  Mouth/Throat: Oropharynx is clear and moist.  Eyes: Pupils are equal, round, and reactive to light. Right eye exhibits no discharge. Left eye exhibits no discharge.  Neck: Normal  range of motion. Neck supple. No thyromegaly present.  Cardiovascular: Normal rate, normal heart sounds and intact distal pulses. An irregular rhythm present.  No murmur heard. Pulmonary/Chest: Effort normal. No respiratory distress. He has wheezes.  Abdominal: Soft. Bowel sounds are normal. He exhibits no distension. There is no tenderness.  Musculoskeletal: Normal range of motion. He exhibits no edema or tenderness.  Neurological: He is alert and oriented to person, place, and time. He has normal reflexes. No cranial nerve deficit.  Skin: Skin is warm and dry. No rash noted. No erythema.  Psychiatric: He has a normal mood and affect. His behavior is normal. Judgment and thought content normal.  Vitals reviewed.     BP (!) 156/76   Pulse 100   Temp (!) 97.5 F (36.4 C) (Oral)   Ht _0  (1.778 m)   Wt 197 lb 9.6 oz (89.6 kg)   BMI 28.35 kg/m      Assessment & Plan:  Jason Spencer comes in today with chief complaint of Hypertension (two week recheck)   Diagnosis and orders addressed:  1. Hyperlipidemia, unspecified hyperlipidemia type Restart Crestor  Low fat diet - rosuvastatin (CRESTOR) 20 MG tablet; Take 1 tablet (20 mg total) by mouth daily.  Dispense: 90 tablet; Refill: 3 - BMP8+EGFR  2. COPD exacerbation ([place]) Start Breo Smoking cessation discussed - predniSONE (STERAPRED UNI-PAK 21 TAB) 10 MG (21) TBPK tablet; Use as directed  Dispense: 21 tablet; Refill: 0 - BMP8+EGFR  3. Chronic obstructive pulmonary disease, unspecified COPD type (HCC) - fluticasone furoate-vilanterol (BREO ELLIPTA) 100-25 MCG/INH AEPB; Inhale 1  puff into the lungs daily.  Dispense: 1 each; Refill: 3 - BMP8+EGFR  4. Atrial fibrillation, new onset w/ RVR Will increase metoprolol to 100 mg from 50 mg Follow up with Cardio - metoprolol succinate (TOPROL XL) 100 MG 24 hr tablet; Take 1 tablet (100 mg total) by mouth daily. Take with or immediately following a meal.  Dispense: 90 tablet;  Refill: 1 - BMP8+EGFR  5. Coronary artery disease involving native coronary artery of native heart without angina pectoris - BMP8+EGFR  6. Tobacco abuse Smoking cessation discussed - BMP8+EGFR  7. Essential hypertension, benign Will increase metoprolol to 100 mg from 50 mg - metoprolol succinate (TOPROL XL) 100 MG 24 hr tablet; Take 1 tablet (100 mg total) by mouth daily. Take with or immediately following a meal.  Dispense: 90 tablet; Refill: 1 - BMP8+EGFR  8. Hypothyroidism, unspecified type Start levothyroxine!!!!  - levothyroxine (SYNTHROID) 50 MCG tablet; Take 1 tablet (50 mcg total) by mouth daily.  Dispense: 90 tablet; Refill: 1 - BMP8+EGFR   Labs pending Health Maintenance reviewed Diet and exercise encouraged  Follow up plan: 2 weeks to recheck HTN   [name], FNP

## 2018-03-06 ENCOUNTER — Ambulatory Visit: Payer: BLUE CROSS/BLUE SHIELD | Admitting: Family

## 2018-03-18 ENCOUNTER — Encounter: Payer: Self-pay | Admitting: Family

## 2018-03-18 ENCOUNTER — Ambulatory Visit: Payer: BLUE CROSS/BLUE SHIELD | Admitting: Family

## 2018-03-18 VITALS — BP 141/88 | HR 58 | Temp 97.5°F | Ht 70.0 in | Wt 197.6 lb

## 2018-03-18 DIAGNOSIS — Z720 Tobacco use: Secondary | ICD-10-CM

## 2018-03-18 DIAGNOSIS — I10 Essential (primary) hypertension: Secondary | ICD-10-CM

## 2018-03-18 DIAGNOSIS — I4891 Unspecified atrial fibrillation: Secondary | ICD-10-CM | POA: Diagnosis not present

## 2018-03-18 DIAGNOSIS — E039 Hypothyroidism, unspecified: Secondary | ICD-10-CM | POA: Diagnosis not present

## 2018-03-18 LAB — CMP14+EGFR
A/G RATIO: 1.1 — AB (ref 1.2–2.2)
ALT: 15 IU/L (ref 0–44)
AST: 27 IU/L (ref 0–40)
Albumin: 3.8 g/dL (ref 3.6–4.8)
Alkaline Phosphatase: 59 IU/L (ref 39–117)
BILIRUBIN TOTAL: 0.6 mg/dL (ref 0.0–1.2)
BUN/Creatinine Ratio: 22 (ref 10–24)
BUN: 18 mg/dL (ref 8–27)
CALCIUM: 9.2 mg/dL (ref 8.6–10.2)
CHLORIDE: 99 mmol/L (ref 96–106)
CO2: 22 mmol/L (ref 20–29)
Creatinine, Ser: 0.81 mg/dL (ref 0.76–1.27)
GFR calc non Af Amer: 94 mL/min/{1.73_m2} (ref >59)
GFR, EST AFRICAN AMERICAN: 109 mL/min/{1.73_m2} (ref >59)
Globulin, Total: 3.5 g/dL (ref 1.5–4.5)
Glucose: 135 mg/dL — ABNORMAL HIGH (ref 65–99)
POTASSIUM: 3.9 mmol/L (ref 3.5–5.2)
Sodium: 136 mmol/L (ref 134–144)
TOTAL PROTEIN: 7.3 g/dL (ref 6.0–8.5)

## 2018-03-18 LAB — COAGUCHEK XS/INR WAIVED
INR: 1.9 — AB (ref 0.9–1.1)
Prothrombin Time: 22.6 s

## 2018-03-18 NOTE — Patient Instructions (Signed)
Hypothyroidism Hypothyroidism is a disorder of the thyroid. The thyroid is a large gland that is located in the lower front of the neck. The thyroid releases hormones that control how the body works. With hypothyroidism, the thyroid does not make enough of these hormones. What are the causes? Causes of hypothyroidism may include:  Viral infections.  Pregnancy.  Your own defense system (immune system) attacking your thyroid.  Certain medicines.  Birth defects.  Past radiation treatments to your head or neck.  Past treatment with radioactive iodine.  Past surgical removal of part or all of your thyroid.  Problems with the gland that is located in the center of your brain (pituitary).  What are the signs or symptoms? Signs and symptoms of hypothyroidism may include:  Feeling as though you have no energy (lethargy).  Inability to tolerate cold.  Weight gain that is not explained by a change in diet or exercise habits.  Dry skin.  Coarse hair.  Menstrual irregularity.  Slowing of thought processes.  Constipation.  Sadness or depression.  How is this diagnosed? Your health care provider may diagnose hypothyroidism with blood tests and ultrasound tests. How is this treated? Hypothyroidism is treated with medicine that replaces the hormones that your body does not make. After you begin treatment, it may take several weeks for symptoms to go away. Follow these instructions at home:  Take medicines only as directed by your health care provider.  If you start taking any new medicines, tell your health care provider.  Keep all follow-up visits as directed by your health care provider. This is important. As your condition improves, your dosage needs may change. You will need to have blood tests regularly so that your health care provider can watch your condition. Contact a health care provider if:  Your symptoms do not get better with treatment.  You are taking thyroid  replacement medicine and: ? You sweat excessively. ? You have tremors. ? You feel anxious. ? You lose weight rapidly. ? You cannot tolerate heat. ? You have emotional swings. ? You have diarrhea. ? You feel weak. Get help right away if:  You develop chest pain.  You develop an irregular heartbeat.  You develop a rapid heartbeat. This information is not intended to replace advice given to you by your health care provider. Make sure you discuss any questions you have with your health care provider. Document Released: 06/26/2005 Document Revised: 12/02/2015 Document Reviewed: 11/11/2013 Elsevier Interactive Patient Education  2018 [employer] Inc[employer]  

## 2018-03-18 NOTE — Progress Notes (Addendum)
   Subjective:    Patient ID: Jason Spencer, male    DOB: 1950/09/08, 67 y.o.   MRN: [id number]  Chief Complaint  Patient presents with  . protime recheck  . medication recheck    Hypertension  This is a chronic problem. The current episode started more than 1 year ago. The problem has been waxing and waning since onset. The problem is uncontrolled. Pertinent negatives include no malaise/fatigue, peripheral edema or shortness of breath. The current treatment provides mild improvement. Hypertensive end-organ damage includes CAD/MI. There is no history of kidney disease. Identifiable causes of hypertension include a thyroid problem.  Thyroid Problem  Presents for follow-up visit. Symptoms include fatigue and hoarse voice. Patient reports no diarrhea. The symptoms have been stable.  A Fib PT taking warfarin. See anticoagulation flow sheet.     Review of Systems  Constitutional: Positive for fatigue. Negative for malaise/fatigue.  HENT: Positive for hoarse voice.   Respiratory: Negative for shortness of breath.   Gastrointestinal: Negative for diarrhea.  All other systems reviewed and are negative.      Objective:   Physical Exam  Constitutional: He is oriented to person, place, and time. He appears well-developed and well-nourished. No distress.  HENT:  Head: Normocephalic.  Eyes: Pupils are equal, round, and reactive to light. Right eye exhibits no discharge. Left eye exhibits no discharge.  Neck: Normal range of motion. Neck supple. No thyromegaly present.  Cardiovascular: Normal rate, regular rhythm, normal heart sounds and intact distal pulses.  No murmur heard. Pulmonary/Chest: Effort normal and breath sounds normal. No respiratory distress. He has no wheezes.  Abdominal: Soft. Bowel sounds are normal. He exhibits no distension. There is no tenderness.  Musculoskeletal: Normal range of motion. He exhibits no edema or tenderness.  Neurological: He is alert and oriented to  person, place, and time. He has normal reflexes. No cranial nerve deficit.  Skin: Skin is warm and dry. No rash noted. No erythema.  Psychiatric: He has a normal mood and affect. His behavior is normal. Judgment and thought content normal.  Vitals reviewed.   BP (!) 141/88   Pulse (!) 58   Temp (!) 97.5 F (36.4 C) (Oral)   Ht '5\' 10"'$  (1.778 m)   Wt 197 lb 9.6 oz (89.6 kg)   BMI 28.35 kg/m      Assessment & Plan:  Siddhartha Hoback comes in today with chief complaint of protime recheck and medication recheck   Diagnosis and orders addressed:  1. Atrial fibrillation with RVR (HCC) - CoaguChek XS/INR Waived - CMP14+EGFR Description   Start taking 7.5 mg on Monday and Friday and 5 mg all other days.   INR today is 1.9  (goal 2-3)       2. Essential hypertension, benign - CMP14+EGFR  3. Tobacco abuse  4. Hypothyroidism, unspecified type - TSH   Labs pending Diet and exercise encouraged  Follow up plan: 3 months with me and 3 weeks with Clinical Pharm to recheck INR   [name], FNP

## 2018-03-20 LAB — SPECIMEN STATUS REPORT

## 2018-03-20 LAB — TSH: TSH: 3.48 u[IU]/mL (ref 0.450–4.500)

## 2018-04-03 ENCOUNTER — Emergency Department (HOSPITAL_COMMUNITY)
Admission: EM | Admit: 2018-04-03 | Discharge: 2018-04-03 | Disposition: A | Discharge status: Home / Self Care | Payer: BLUE CROSS/BLUE SHIELD | Attending: Emergency Medicine | Admitting: Emergency Medicine

## 2018-04-03 ENCOUNTER — Other Ambulatory Visit: Payer: Self-pay

## 2018-04-03 ENCOUNTER — Encounter (HOSPITAL_COMMUNITY): Payer: Self-pay | Admitting: *Deleted

## 2018-04-03 DIAGNOSIS — F17210 Nicotine dependence, cigarettes, uncomplicated: Secondary | ICD-10-CM | POA: Insufficient documentation

## 2018-04-03 DIAGNOSIS — Z7901 Long term (current) use of anticoagulants: Secondary | ICD-10-CM | POA: Diagnosis not present

## 2018-04-03 DIAGNOSIS — I2510 Atherosclerotic heart disease of native coronary artery without angina pectoris: Secondary | ICD-10-CM | POA: Diagnosis not present

## 2018-04-03 DIAGNOSIS — I83891 Varicose veins of right lower extremities with other complications: Secondary | ICD-10-CM | POA: Insufficient documentation

## 2018-04-03 DIAGNOSIS — R58 Hemorrhage, not elsewhere classified: Secondary | ICD-10-CM | POA: Insufficient documentation

## 2018-04-03 DIAGNOSIS — E039 Hypothyroidism, unspecified: Secondary | ICD-10-CM | POA: Insufficient documentation

## 2018-04-03 DIAGNOSIS — I5020 Unspecified systolic (congestive) heart failure: Secondary | ICD-10-CM | POA: Insufficient documentation

## 2018-04-03 DIAGNOSIS — J449 Chronic obstructive pulmonary disease, unspecified: Secondary | ICD-10-CM | POA: Insufficient documentation

## 2018-04-03 DIAGNOSIS — I110 Hypertensive heart disease with heart failure: Secondary | ICD-10-CM | POA: Diagnosis not present

## 2018-04-03 LAB — CBC
HCT: 45.9 % (ref 39.0–52.0)
Hemoglobin: 15.5 g/dL (ref 13.0–17.0)
MCH: 35.3 pg — AB (ref 26.0–34.0)
MCHC: 33.8 g/dL (ref 30.0–36.0)
MCV: 104.6 fL — AB (ref 78.0–100.0)
Platelets: 180 10*3/uL (ref 150–400)
RBC: 4.39 MIL/uL (ref 4.22–5.81)
RDW: 12.5 % (ref 11.5–15.5)
WBC: 7 10*3/uL (ref 4.0–10.5)

## 2018-04-03 LAB — BASIC METABOLIC PANEL
Anion gap: 7 (ref 5–15)
BUN: 10 mg/dL (ref 8–23)
CO2: 26 mmol/L (ref 22–32)
Calcium: 8.9 mg/dL (ref 8.9–10.3)
Chloride: 104 mmol/L (ref 98–111)
Creatinine, Ser: 0.72 mg/dL (ref 0.61–1.24)
GFR calc Af Amer: >60 mL/min (ref >60)
GLUCOSE: 107 mg/dL — AB (ref 70–99)
Potassium: 4.2 mmol/L (ref 3.5–5.1)
SODIUM: 137 mmol/L (ref 135–145)

## 2018-04-03 LAB — PROTIME-INR
INR: 2.61
Prothrombin Time: 27.7 seconds — ABNORMAL HIGH (ref 11.4–15.2)

## 2018-04-03 NOTE — ED Provider Notes (Signed)
[facility] EMERGENCY DEPARTMENT Provider Note   CSN: [id number] Arrival date & time: 04/03/18  1914     History   Chief Complaint Chief Complaint  Patient presents with  . Coagulation Disorder    HPI Jason Spencer is a 67 y.o. male.  [name] was brought in by [facility] for a bleeding varicose vein in the right lower leg.  Patient's been having difficulty with this bleeding on and off for about a month.  Started bleeding at work.  EMS redressed the wound and bleeding was controlled upon arrival.  Patient is on Coumadin for history of atrial fibrillation.  Previous Coumadin levels have been subtherapeutic.  Patient has not had a Coumadin check since the beginning of September.  Patient without any other complaints.     Past Medical History:  Diagnosis Date  . Acute CHF (HCC)    Jason Spencer 08/13/2014  . Atrial fibrillation with RVR (HCC) 08/13/2014   new onset/notes 08/13/2014  . CAP (community acquired pneumonia) 07/2014  . Cardiomyopathy (HCC)   . Migraine 1970     Patient Active Problem List   Diagnosis Date Noted  . Hypothyroidism 03/18/2018  . Hyperlipidemia 01/04/2017  . Pre-diabetes 05/24/2016  . Essential hypertension, benign 04/27/2016  . History of prolonged Q-T interval on ECG 04/01/2015  . CAD (coronary artery disease) 02/17/2015  . COPD exacerbation (HCC)   . Atrial fibrillation with RVR (HCC)   . Tobacco abuse   . Hypokalemia   . Loculated pleural effusion 08/15/2014  . S/P thoracentesis   . Acute on chronic systolic CHF (congestive heart failure) (HCC)   . Acute respiratory failure with hypoxia (HCC) 08/13/2014  . CAP (community acquired pneumonia) 08/13/2014  . Atrial fibrillation, new onset w/ RVR 08/13/2014  . Bilateral lower extremity edema 08/13/2014    Past Surgical History:  Procedure Laterality Date  . CARDIOVERSION N/A 08/19/2014   Procedure: CARDIOVERSION;  Surgeon: Laurey Morale, MD;  Location: [facility] ENDOSCOPY;  Service:  Cardiovascular;  Laterality: N/A;  . LEFT AND RIGHT HEART CATHETERIZATION WITH CORONARY ANGIOGRAM N/A 08/17/2014   Procedure: LEFT AND RIGHT HEART CATHETERIZATION WITH CORONARY ANGIOGRAM;  Surgeon: Lesleigh Noe, MD;  Location: [facility] CATH LAB;  Service: Cardiovascular;  Laterality: N/A;  . NO PAST SURGERIES    . PERCUTANEOUS CORONARY STENT INTERVENTION (PCI-S)  08/17/2014   Procedure: PERCUTANEOUS CORONARY STENT INTERVENTION (PCI-S);  Surgeon: Lesleigh Noe, MD;  Location: [facility] CATH LAB;  Service: Cardiovascular;;  . TEE WITHOUT CARDIOVERSION N/A 08/19/2014   Procedure: TRANSESOPHAGEAL ECHOCARDIOGRAM (TEE);  Surgeon: Laurey Morale, MD;  Location: [facility] ENDOSCOPY;  Service: Cardiovascular;  Laterality: N/A;        Home Medications    Prior to Admission medications   Medication Sig Start Date End Date Taking? Authorizing Provider  acetaminophen (TYLENOL) 325 MG tablet Take 2 tablets (650 mg total) by mouth every 4 (four) hours as needed for headache or mild pain. 08/21/14   Mikhail, Nita Sells, DO  fluticasone furoate-vilanterol (BREO ELLIPTA) 100-25 MCG/INH AEPB Inhale 1 puff into the lungs daily. 02/08/18   [name], FNP  levothyroxine (SYNTHROID) 50 MCG tablet Take 1 tablet (50 mcg total) by mouth daily. 02/08/18 02/08/19  [name], FNP  lisinopril (PRINIVIL,ZESTRIL) 40 MG tablet Take 1 tablet (40 mg total) by mouth daily. 01/25/18   [name], FNP  metoprolol succinate (TOPROL XL) 100 MG 24 hr tablet Take 1 tablet (100 mg total) by mouth daily. Take with or  immediately following a meal. 02/08/18   Hawks, [name], FNP  rosuvastatin (CRESTOR) 20 MG tablet Take 1 tablet (20 mg total) by mouth daily. 02/08/18   [name], FNP  warfarin (COUMADIN) 5 MG tablet Take 1 and 1/2 tablets on mondays, wednesdays and fridays.  Take 1 tablet all other days. 10/25/17   [name], FNP    Family History Family History  Problem Relation Age of Onset  . Heart disease Mother         VALUE REPLACEMENT  . COPD Father   . Aneurysm Father     Social History Social History   Tobacco Use  . Smoking status: Current Every Day Smoker    Packs/day: 0.50    Years: 44.00    Pack years: 22.00    Types: Cigarettes  . Smokeless tobacco: Never Used  Substance Use Topics  . Alcohol use: Yes    Alcohol/week: 0.0 standard drinks    Comment: 08/13/2014 "I haven't drank in a month or so; if I drink it would be beer on a warm day"  . Drug use: No     Allergies   Patient has no known allergies.   Review of Systems Review of Systems  Constitutional: Negative for fever.  HENT: Negative for congestion.   Eyes: Negative for redness.  Respiratory: Negative for shortness of breath.   Cardiovascular: Negative for chest pain.  Gastrointestinal: Negative for abdominal pain.  Genitourinary: Negative for hematuria.  Musculoskeletal: Negative for back pain.  Skin: Positive for wound.  Neurological: Negative for headaches.  Hematological: Bruises/bleeds easily.  Psychiatric/Behavioral: Negative for confusion.     Physical Exam Updated Vital Signs BP (!) 167/85 (BP Location: Right Arm)   Pulse 83   Temp 98.3 F (36.8 C) (Oral)   Resp 16   Ht 1.778 m (5\' 10" )   Wt 87.1 kg   SpO2 100%   BMI 27.55 kg/m   Physical Exam  Constitutional: He is oriented to person, place, and time. He appears well-developed and well-nourished. No distress.  HENT:  Head: Normocephalic and atraumatic.  Mouth/Throat: Oropharynx is clear and moist.  Eyes: Pupils are equal, round, and reactive to light. Conjunctivae are normal.  Neck: Neck supple.  Cardiovascular: Normal rate, regular rhythm and normal heart sounds.  Pulmonary/Chest: Effort normal and breath sounds normal. No respiratory distress.  Abdominal: Soft. Bowel sounds are normal. There is no tenderness.  Musculoskeletal: Normal range of motion. He exhibits no edema.  Patient's right lower extremity anterior shin area with a dilated  varicose vein with a small puncture with some slow bleeding.  Her venous type blood.  Dark in nature.  No deep ulcer or significantly open wound.  There is evidence of superficial varicose veins.  No significant swelling.  No significant erythema.  Neurological: He is alert and oriented to person, place, and time. No cranial nerve deficit or sensory deficit. He exhibits normal muscle tone. Coordination normal.  Skin: Skin is warm.  Nursing note and vitals reviewed.    ED Treatments / Results  Labs (all labs ordered are listed, but only abnormal results are displayed) Labs Reviewed  PROTIME-INR - Abnormal; Notable for the following components:      Result Value   Prothrombin Time 27.7 (*)    All other components within normal limits  CBC - Abnormal; Notable for the following components:   MCV 104.6 (*)    MCH 35.3 (*)    All other components within normal limits  BASIC METABOLIC PANEL - Abnormal; Notable for the following components:   Glucose, Bld 107 (*)    All other components within normal limits    EKG None  Radiology No results found.  Procedures Procedures (including critical care time)  Medications Ordered in ED Medications - No data to display   Initial Impression / Assessment and Plan / ED Course  I have reviewed the triage vital signs and the nursing notes.  Pertinent labs & imaging results that were available during my care of the patient were reviewed by me and considered in my medical decision making (see chart for details).     So EMS dressing was removed.  And there was recurrent bleeding from the varicose vein site.  This was redressed with quick clot dressing Kerlix and Covan.  Patient given referral to vascular surgery as well as general surgery here locally.  Which may be able to deal with this.  Bleeding controlled at time of discharge.  Patient's INR came in therapeutic on the upper limits of therapeutic at 2.9.  Patient's blood counts were  normal.  Patient will keep this dressing in place for 2 days.  Then will change it was given additional dressing clot material.  Patient will keep leg elevated.  Work note provided.  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

## 2018-04-03 NOTE — ED Triage Notes (Signed)
Pt brought in by [facility] for c/o varicose vein to right lower leg; pt states the bleeding started while at work; wound dressed and bleeding controlled at this time; pt had bleeding to same area x 3 weeks ago; pt cbg 124

## 2018-04-03 NOTE — Discharge Instructions (Addendum)
Keep the current dressing with the quick clot on it in place for the next 2 days.  Work note provided to be out of work for 1 week.  Call general surgery and/or vascular surgery in the morning to set up an appointment.  Can redress it after 2 days replace the quick clot and re-wrap with the dressing material provided.  Try to stay off your feet is much as possible.  At night elevate your right leg on 2 pillows.  This will help minimize the chance of rebleeding.  Return for any new or worse symptoms.

## 2018-04-04 ENCOUNTER — Telehealth: Payer: Self-pay | Admitting: *Deleted

## 2018-04-04 ENCOUNTER — Other Ambulatory Visit: Payer: Self-pay | Admitting: *Deleted

## 2018-04-04 DIAGNOSIS — I83891 Varicose veins of right lower extremities with other complications: Secondary | ICD-10-CM

## 2018-04-04 NOTE — Telephone Encounter (Signed)
Returning Jason Spencer's phone call regarding bleeding varicosity on his right leg below his knee that has erupted twice in past two weeks.  He was seen at [facility] ER 04-03-2018 and was referred to [facility].  [name] has compression dressing on bleeding site.  No bleeding since ER visit.  Reviewed with him how to stop bleeding if bleeding starts again.  Appointment made for venous reflux study (bilateral) at 1:00PM at [facility] on 04-10-2018 and appointment to see Dr. Darrick Penna at 2:30PM on same day.

## 2018-04-10 ENCOUNTER — Ambulatory Visit (HOSPITAL_COMMUNITY)
Admission: RE | Admit: 2018-04-10 | Discharge: 2018-04-10 | Disposition: A | Discharge status: Home / Self Care | Payer: BLUE CROSS/BLUE SHIELD | Source: Ambulatory Visit | Attending: Vascular Surgery | Admitting: Vascular Surgery

## 2018-04-10 ENCOUNTER — Other Ambulatory Visit: Payer: Self-pay

## 2018-04-10 ENCOUNTER — Ambulatory Visit: Payer: BLUE CROSS/BLUE SHIELD | Admitting: Vascular Surgery

## 2018-04-10 ENCOUNTER — Encounter: Payer: Self-pay | Admitting: Vascular Surgery

## 2018-04-10 VITALS — BP 164/80 | HR 100 | Temp 97.8°F | Resp 18 | Ht 70.0 in | Wt 197.0 lb

## 2018-04-10 DIAGNOSIS — R936 Abnormal findings on diagnostic imaging of limbs: Secondary | ICD-10-CM | POA: Diagnosis not present

## 2018-04-10 DIAGNOSIS — I83891 Varicose veins of right lower extremities with other complications: Secondary | ICD-10-CM

## 2018-04-10 NOTE — Progress Notes (Addendum)
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 male with 2 episodes of bleeding from the right leg from a varicose vein.  The first episode was about 3 or 4 weeks ago.  He then had a second episode a few days ago.  He had to go to the emergency room for the bleeding.  He has missed work on several occasions because of the bleeding.  His job requires him to stand long periods of time at work.  He also uses a machine that occasionally brushes against his leg that apparently aggravated the bleeding on the previous occasions.  He has been attempting to wear compression dressings over the right calf since his emergency room visit on April 03, 2018.  Of note he does have atrial fibrillation and is on warfarin for this.  He states his mother had a history of varicose veins.  He denies prior history of DVT.  He has not worn compression stockings in the past.  He is a smoker but is thinking about quitting.  He currently does not have any left leg complaints.  Other medical problems include COPD which is been stable but he does occasionally have some wheezing.  Past Medical History:  Diagnosis Date  . Acute CHF (HCC)    [name] 08/13/2014  . Atrial fibrillation with RVR (HCC) 08/13/2014   new onset/notes 08/13/2014  . CAP (community acquired pneumonia) 07/2014  . Cardiomyopathy (HCC)   . Migraine 1970    Past Surgical History:  Procedure Laterality Date  . CARDIOVERSION N/A 08/19/2014   Procedure: CARDIOVERSION;  Surgeon: Laurey Morale, MD;  Location: [facility] ENDOSCOPY;  Service: Cardiovascular;  Laterality: N/A;  . LEFT AND RIGHT HEART CATHETERIZATION WITH CORONARY ANGIOGRAM N/A 08/17/2014   Procedure: LEFT AND RIGHT HEART CATHETERIZATION WITH CORONARY ANGIOGRAM;  Surgeon: Lesleigh Noe, MD;  Location: [facility] CATH LAB;  Service: Cardiovascular;  Laterality: N/A;  . NO PAST  SURGERIES    . PERCUTANEOUS CORONARY STENT INTERVENTION (PCI-S)  08/17/2014   Procedure: PERCUTANEOUS CORONARY STENT INTERVENTION (PCI-S);  Surgeon: Lesleigh Noe, MD;  Location: [facility] CATH LAB;  Service: Cardiovascular;;  . TEE WITHOUT CARDIOVERSION N/A 08/19/2014   Procedure: TRANSESOPHAGEAL ECHOCARDIOGRAM (TEE);  Surgeon: Laurey Morale, MD;  Location: [facility] ENDOSCOPY;  Service: Cardiovascular;  Laterality: N/A;    Family History  Problem Relation Age of Onset  . Heart disease Mother        VALUE REPLACEMENT  . COPD Father   . Aneurysm Father     SOCIAL HISTORY: Social History   Socioeconomic History  . Marital status: Single    Spouse name: Not on file  . Number of children: Not on file  . Years of education: Not on file  . Highest education level: Not on file  Occupational History  . Not on file  Social Needs  . Financial resource strain: Not on file  . Food insecurity:    Worry: Not on file    Inability: Not on file  . Transportation needs:    Medical: Not on file    Non-medical: Not on file  Tobacco Use  . Smoking status: Current Every Day Smoker    Packs/day: 0.50    Years: 44.00    Pack years: 22.00    Types: Cigarettes  . Smokeless tobacco: Never Used  Substance and Sexual Activity  .  Alcohol use: Yes    Alcohol/week: 0.0 standard drinks    Comment: 08/13/2014 "I haven't drank in a month or so; if I drink it would be beer on a warm day"  . Drug use: No  . Sexual activity: Not Currently  Lifestyle  . Physical activity:    Days per week: Not on file    Minutes per session: Not on file  . Stress: Not on file  Relationships  . Social connections:    Talks on phone: Not on file    Gets together: Not on file    Attends religious service: Not on file    Active member of club or organization: Not on file    Attends meetings of clubs or organizations: Not on file    Relationship status: Not on file  . Intimate partner violence:    Fear of current or ex partner:  Not on file    Emotionally abused: Not on file    Physically abused: Not on file    Forced sexual activity: Not on file  Other Topics Concern  . Not on file  Social History Narrative  . Not on file    No Known Allergies  Current Outpatient Medications  Medication Sig Dispense Refill  . acetaminophen (TYLENOL) 325 MG tablet Take 2 tablets (650 mg total) by mouth every 4 (four) hours as needed for headache or mild pain.    . fluticasone furoate-vilanterol (BREO ELLIPTA) 100-25 MCG/INH AEPB Inhale 1 puff into the lungs daily. 1 each 3  . levothyroxine (SYNTHROID) 50 MCG tablet Take 1 tablet (50 mcg total) by mouth daily. 90 tablet 1  . lisinopril (PRINIVIL,ZESTRIL) 40 MG tablet Take 1 tablet (40 mg total) by mouth daily. 90 tablet 3  . metoprolol succinate (TOPROL XL) 100 MG 24 hr tablet Take 1 tablet (100 mg total) by mouth daily. Take with or immediately following a meal. 90 tablet 1  . rosuvastatin (CRESTOR) 20 MG tablet Take 1 tablet (20 mg total) by mouth daily. 90 tablet 3  . warfarin (COUMADIN) 5 MG tablet Take 1 and 1/2 tablets on mondays, wednesdays and fridays.  Take 1 tablet all other days. 50 tablet 3   No current facility-administered medications for this visit.     ROS:   General:  No weight loss, Fever, chills  HEENT: No recent headaches, no nasal bleeding, no visual changes, no sore throat  Neurologic: No dizziness, blackouts, seizures. No recent symptoms of stroke or mini- stroke. No recent episodes of slurred speech, or temporary blindness.  Cardiac: No recent episodes of chest pain/pressure, no shortness of breath at rest.  + shortness of breath with exertion.  Denies history of atrial fibrillation or irregular heartbeat  Vascular: No history of rest pain in feet.  No history of claudication.  No history of non-healing ulcer, No history of DVT   Pulmonary: No home oxygen, no productive cough, no hemoptysis,  + asthma or wheezing  Musculoskeletal:  [ ]  Arthritis,  [ ]  Low back pain,  [ ]  Joint pain  Hematologic:No history of hypercoagulable state.  No history of easy bleeding.  No history of anemia  Gastrointestinal: No hematochezia or melena,  No gastroesophageal reflux, no trouble swallowing  Urinary: [ ]  chronic Kidney disease, [ ]  on HD - [ ]  MWF or [ ]  TTHS, [ ]  Burning with urination, [ ]  Frequent urination, [ ]  Difficulty urinating;   Skin: No rashes  Psychological: No history of anxiety,  No history of  depression   Physical Examination  Vitals:   04/10/18 1321  BP: (!) 164/80  Pulse: 100  Resp: 18  Temp: 97.8 F (36.6 C)  TempSrc: Oral  SpO2: 94%  Weight: 197 lb (89.4 kg)  Height: 5\' 10"  (1.778 m)    Body mass index is 28.27 kg/m.  General:  Alert and oriented, no acute distress HEENT: Normal Neck: No bruit or JVD Pulmonary: Clear to auscultation bilaterally Cardiac: Regular Rate and Rhythm without murmur Abdomen: Soft, non-tender, non-distended, no mass, no scars Skin: No rash, hemosiderin staining right lateral calf erythema and discoloration right medial calf from previous site of bleeding, engorged dilated right greater saphenous vein extending from the groin all the way to the calf see images below.  Left leg also has large distended varicosities especially in the left medial calf 7 to 8 mm diameter. Extremity Pulses:  2+ radial, brachial, femoral, absent dorsalis pedis, posterior tibial pulses bilaterally Musculoskeletal: No deformity or edema  Neurologic: Upper and lower extremity motor 5/5 and symmetric                DATA:  Patient had a venous duplex exam today which showed dilation of his greater saphenous vein 4 to 7 mm and is largest 10 mm at the saphenofemoral junction.  I reviewed and interpreted the study.  He also had common femoral vein reflux.  I also examined the patient's vein with the SonoSite at the bedside.  This is a fairly superficial vein which on my measurement I was able to see the  vein between 7 and 10 mm in diameter all the way up to the groin.  The superficial system was certainly more dilated than the deeper segment of the saphenous vein which was probably the segment measured by the duplex study done in the vascular lab.  However, the 7 to 10 mm dilated segment does connect to the saphenous vein in the upper thigh and dumps into the main saphenous system.  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  CEAP class IV with bleeding wiring emergency room visits.  Currently asymptomatic but also large distended varicose veins left leg   PLAN: Patient was given a prescription today for long leg compression stockings for symptomatic relief.  However, I do not believe he is going to be able to wear his compression stockings at this time due to recent bleeding and possibility of pulling off a scab and causing a recurrent bleed.  The skin is a very poor quality and poor integrity at this point.  I discussed with him today the possibility of laser ablation of his right greater saphenous vein to prevent further bleeding episodes.  We will also try to get him approved for 1 unit of sclerotherapy at the bleeding site to prevent further bleeding episodes.  Return for follow-up in 3 months time or sooner if we can get this approved for him.   [name], MD [facility] Office: [phone] Pager: [phone]

## 2018-04-18 ENCOUNTER — Other Ambulatory Visit: Payer: Self-pay | Admitting: Family

## 2018-04-18 DIAGNOSIS — I10 Essential (primary) hypertension: Secondary | ICD-10-CM

## 2018-04-18 DIAGNOSIS — I4891 Unspecified atrial fibrillation: Secondary | ICD-10-CM

## 2018-04-23 ENCOUNTER — Other Ambulatory Visit: Payer: Self-pay | Admitting: *Deleted

## 2018-04-23 DIAGNOSIS — I83891 Varicose veins of right lower extremities with other complications: Secondary | ICD-10-CM

## 2018-04-24 ENCOUNTER — Telehealth: Payer: Self-pay | Admitting: *Deleted

## 2018-04-24 NOTE — Telephone Encounter (Signed)
Angelica Chessman (nurse from [facility] left detailed telephone message conveying that Dr. Louanne Skye had completed medication discontinuation form and she FAXed it to me.  Fax was received.  Approval received for discontinuation of Coumadin 5 days prior to procedure, day of procedure, and 2 days post procedure.  Mr. Coffelt is scheduled for endovenous laser ablation of right greater saphenous vein on 05-01-2018 by Dr. Darrick Penna.

## 2018-04-24 NOTE — Telephone Encounter (Signed)
Reviewed with Jason Spencer that per Dr. Etheleen Mayhew he was to stop Coumadin 5 days prior to office surgery (scheduled for 05-01-2018), day of procedure, and for 2 days post procedure. Reviewed with Jason Spencer that his last dose of Coumadin will be 04-25-2018. Jason Spencer made aware that he is not to take Coumadin 04-26-2018 through 05-03-2018.  Jason Spencer is to resume Coumadin 05-04-2018.  Jason Spencer verbalized understanding.  Mailing him pre-procedure instructions with Coumadin instructions today.

## 2018-04-29 ENCOUNTER — Ambulatory Visit: Payer: BLUE CROSS/BLUE SHIELD | Admitting: Family

## 2018-05-01 ENCOUNTER — Other Ambulatory Visit: Payer: Self-pay

## 2018-05-01 ENCOUNTER — Encounter: Payer: Self-pay | Admitting: Family

## 2018-05-01 ENCOUNTER — Encounter: Payer: Self-pay | Admitting: Vascular Surgery

## 2018-05-01 ENCOUNTER — Ambulatory Visit: Payer: BLUE CROSS/BLUE SHIELD | Admitting: Vascular Surgery

## 2018-05-01 VITALS — BP 156/96 | HR 94 | Temp 97.0°F | Resp 16 | Ht 70.0 in | Wt 197.0 lb

## 2018-05-01 DIAGNOSIS — I83811 Varicose veins of right lower extremities with pain: Secondary | ICD-10-CM

## 2018-05-01 HISTORY — PX: ENDOVENOUS ABLATION SAPHENOUS VEIN W/ LASER: SUR449

## 2018-05-01 NOTE — Progress Notes (Signed)
     Laser Ablation Procedure    Date: 05/01/2018   Jason Spencer DOB:[birth year]  Consent signed: Yes    Surgeon:  Dr. [name]  Procedure: Laser Ablation: right Greater Saphenous Vein  BP (!) 156/96 (BP Location: Right Arm, Patient Position: Sitting, Cuff Size: Normal)   Pulse 94   Temp (!) 97 F (36.1 C) (Oral)   Resp 16   Ht 5\' 10"  (1.778 m)   Wt 197 lb (89.4 kg)   SpO2 96%   BMI 28.27 kg/m   Tumescent Anesthesia: 300 cc 0.9% NaCl with 50 cc Lidocaine HCL 1% and 15 cc 8.4% NaHCO3  Local Anesthesia: 5 cc Lidocaine HCL and NaHCO3 (ratio 2:1)  15 watts continuous mode        Total energy: 1792 Joules   Total time: 1:57    Patient tolerated procedure well  Notes: Last dose of Coumadin taken by Mr. Louk on 04-25-2018. Hibiclens was used to prep patient as Mr. Ripple is allergic to shellfish/Iodine.   Description of Procedure:  After marking the course of the secondary varicosities, the patient was placed on the operating table in the supine position, and the right leg was prepped and draped in sterile fashion.   Local anesthetic was administered and under ultrasound guidance the saphenous vein was accessed with a micro needle and guide wire; then the mirco puncture sheath was placed.  A guide wire was inserted saphenofemoral junction , followed by a 5 french sheath.  The position of the sheath and then the laser fiber below the junction was confirmed using the ultrasound.  Tumescent anesthesia was administered along the course of the saphenous vein using ultrasound guidance. The patient was placed in Trendelenburg position and protective laser glasses were placed on patient and staff, and the laser was fired at 15 watts continuous mode advancing 1-63mm/second for a total of 1792 joules.     Steri strip was applied to the IV insertion site and ABD pads and thigh high compression stockings were applied.  Ace wrap bandages were applied over the right thigh and at the top  of the saphenofemoral junction. Blood loss was less than 15 cc.  The patient ambulated out of the operating room having tolerated the procedure well.  [name], MD [facility] Office: [phone] Pager: [phone]

## 2018-05-02 ENCOUNTER — Encounter: Payer: Self-pay | Admitting: Vascular Surgery

## 2018-05-14 ENCOUNTER — Other Ambulatory Visit: Payer: Self-pay | Admitting: Family

## 2018-05-14 DIAGNOSIS — I2510 Atherosclerotic heart disease of native coronary artery without angina pectoris: Secondary | ICD-10-CM

## 2018-05-14 DIAGNOSIS — I4891 Unspecified atrial fibrillation: Secondary | ICD-10-CM

## 2018-05-14 DIAGNOSIS — I10 Essential (primary) hypertension: Secondary | ICD-10-CM

## 2018-05-14 MED ORDER — METOPROLOL SUCCINATE ER 100 MG PO TB24: 100.0000 mg | ORAL_TABLET | Freq: Every day | ORAL | 1 refills | Status: DC

## 2018-05-14 MED ORDER — WARFARIN SODIUM 5 MG PO TABS: ORAL_TABLET | ORAL | 3 refills | Status: DC

## 2018-05-14 NOTE — Telephone Encounter (Signed)
Refill sent to pharmacy.   

## 2018-05-15 ENCOUNTER — Ambulatory Visit (INDEPENDENT_AMBULATORY_CARE_PROVIDER_SITE_OTHER): Payer: BLUE CROSS/BLUE SHIELD | Admitting: Vascular Surgery

## 2018-05-15 ENCOUNTER — Other Ambulatory Visit: Payer: Self-pay

## 2018-05-15 ENCOUNTER — Ambulatory Visit (HOSPITAL_COMMUNITY)
Admission: RE | Admit: 2018-05-15 | Discharge: 2018-05-15 | Disposition: A | Discharge status: Home / Self Care | Payer: BLUE CROSS/BLUE SHIELD | Source: Ambulatory Visit | Attending: Vascular Surgery | Admitting: Vascular Surgery

## 2018-05-15 ENCOUNTER — Encounter: Payer: Self-pay | Admitting: Vascular Surgery

## 2018-05-15 VITALS — BP 152/73 | HR 93 | Resp 18 | Ht 70.0 in | Wt 197.0 lb

## 2018-05-15 DIAGNOSIS — I83811 Varicose veins of right lower extremities with pain: Secondary | ICD-10-CM

## 2018-05-15 DIAGNOSIS — I83891 Varicose veins of right lower extremities with other complications: Secondary | ICD-10-CM | POA: Diagnosis present

## 2018-05-15 DIAGNOSIS — I868 Varicose veins of other specified sites: Secondary | ICD-10-CM

## 2018-05-15 NOTE — Progress Notes (Signed)
Patient is a 67 year old male who returns for follow-up today.  He underwent laser ablation of his right greater saphenous vein on May 01, 2018.  He states the pain symptoms have resolved.  His procedure was originally done secondary to bleeding from varicosities in the medial aspect of his right calf.  So far he has not had any further bleeding episodes.  However the skin remains quite fragile over the area of varicosities and would be very easily prone to rebleeding.  Physical exam:  Vitals:   05/15/18 1024  BP: (!) 152/73  Pulse: 93  Resp: 18  SpO2: 96%  Weight: 197 lb (89.4 kg)  Height: 5\' 10"  (1.778 m)    Right lower extremity palpable cord within the vein, skin erosion medial right leg over the greater saphenous area just below the knee from area of prior bleeds very fragile appearing skin with eschar  Data: Patient had a duplex ultrasound today which showed successful obliteration of the right greater saphenous vein to within 1.7 cm of the saphenofemoral junction.  There was no DVT.  Assessment: The patient has successful closure of the right greater saphenous vein.  He still is at high risk of bleeding due to varicosities below this.  Plan: We will schedule patient for sclerotherapy of the fragile area at at risk for bleeding in the near future pending insurance approval.  [name], MD [facility] [place] Office: [phone] Pager: [phone]

## 2018-05-21 ENCOUNTER — Ambulatory Visit: Payer: BLUE CROSS/BLUE SHIELD | Admitting: *Deleted

## 2018-07-31 ENCOUNTER — Ambulatory Visit: Payer: BLUE CROSS/BLUE SHIELD | Admitting: Vascular Surgery

## 2018-08-28 DIAGNOSIS — L400 Psoriasis vulgaris: Secondary | ICD-10-CM | POA: Diagnosis not present

## 2018-08-28 DIAGNOSIS — L308 Other specified dermatitis: Secondary | ICD-10-CM | POA: Diagnosis not present

## 2018-08-29 ENCOUNTER — Ambulatory Visit (INDEPENDENT_AMBULATORY_CARE_PROVIDER_SITE_OTHER): Payer: Medicare HMO | Admitting: Family

## 2018-08-29 ENCOUNTER — Encounter: Payer: Self-pay | Admitting: Family

## 2018-08-29 VITALS — BP 179/98 | HR 64 | Temp 96.0°F | Ht 70.0 in | Wt 181.4 lb

## 2018-08-29 DIAGNOSIS — I4891 Unspecified atrial fibrillation: Secondary | ICD-10-CM | POA: Diagnosis not present

## 2018-08-29 DIAGNOSIS — Z720 Tobacco use: Secondary | ICD-10-CM

## 2018-08-29 DIAGNOSIS — E785 Hyperlipidemia, unspecified: Secondary | ICD-10-CM | POA: Diagnosis not present

## 2018-08-29 DIAGNOSIS — I2510 Atherosclerotic heart disease of native coronary artery without angina pectoris: Secondary | ICD-10-CM | POA: Diagnosis not present

## 2018-08-29 DIAGNOSIS — Z7901 Long term (current) use of anticoagulants: Secondary | ICD-10-CM | POA: Diagnosis not present

## 2018-08-29 DIAGNOSIS — E663 Overweight: Secondary | ICD-10-CM | POA: Diagnosis not present

## 2018-08-29 DIAGNOSIS — J441 Chronic obstructive pulmonary disease with (acute) exacerbation: Secondary | ICD-10-CM | POA: Diagnosis not present

## 2018-08-29 DIAGNOSIS — I10 Essential (primary) hypertension: Secondary | ICD-10-CM

## 2018-08-29 DIAGNOSIS — E039 Hypothyroidism, unspecified: Secondary | ICD-10-CM | POA: Diagnosis not present

## 2018-08-29 LAB — COAGUCHEK XS/INR WAIVED
INR: 3.1 — ABNORMAL HIGH (ref 0.9–1.1)
Prothrombin Time: 37.2 s

## 2018-08-29 MED ORDER — LISINOPRIL 40 MG PO TABS: 40.0000 mg | ORAL_TABLET | Freq: Every day | ORAL | 3 refills | Status: DC

## 2018-08-29 MED ORDER — LEVOTHYROXINE SODIUM 50 MCG PO TABS: 50.0000 ug | ORAL_TABLET | Freq: Every day | ORAL | 1 refills | Status: DC

## 2018-08-29 MED ORDER — PREDNISONE 10 MG (21) PO TBPK: ORAL_TABLET | ORAL | 0 refills | Status: DC

## 2018-08-29 MED ORDER — METOPROLOL SUCCINATE ER 100 MG PO TB24: 100.0000 mg | ORAL_TABLET | Freq: Every day | ORAL | 1 refills | Status: DC

## 2018-08-29 MED ORDER — WARFARIN SODIUM 5 MG PO TABS: ORAL_TABLET | ORAL | 3 refills | Status: DC

## 2018-08-29 NOTE — Patient Instructions (Signed)
Chronic Obstructive Pulmonary Disease Chronic obstructive pulmonary disease (COPD) is a long-term (chronic) lung problem. When you have COPD, it is hard for air to get in and out of your lungs. Usually the condition gets worse over time, and your lungs will never return to normal. There are things you can do to keep yourself as healthy as possible.  Your doctor may treat your condition with: ? Medicines. ? Oxygen. ? Lung surgery.  Your doctor may also recommend: ? Rehabilitation. This includes steps to make your body work better. It may involve a team of specialists. ? Quitting smoking, if you smoke. ? Exercise and changes to your diet. ? Comfort measures (palliative care). Follow these instructions at home: Medicines  Take over-the-counter and prescription medicines only as told by your doctor.  Talk to your doctor before taking any cough or allergy medicines. You may need to avoid medicines that cause your lungs to be dry. Lifestyle  If you smoke, stop. Smoking makes the problem worse. If you need help quitting, ask your doctor.  Avoid being around things that make your breathing worse. This may include smoke, chemicals, and fumes.  Stay active, but remember to rest as well.  Learn and use tips on how to relax.  Make sure you get enough sleep. Most adults need at least 7 hours of sleep every night.  Eat healthy foods. Eat smaller meals more often. Rest before meals. Controlled breathing Learn and use tips on how to control your breathing as told by your doctor. Try:  Breathing in (inhaling) through your nose for 1 second. Then, pucker your lips and breath out (exhale) through your lips for 2 seconds.  Putting one hand on your belly (abdomen). Breathe in slowly through your nose for 1 second. Your hand on your belly should move out. Pucker your lips and breathe out slowly through your lips. Your hand on your belly should move in as you breathe out.  Controlled coughing Learn  and use controlled coughing to clear mucus from your lungs. Follow these steps: 1. Lean your head a little forward. 2. Breathe in deeply. 3. Try to hold your breath for 3 seconds. 4. Keep your mouth slightly open while coughing 2 times. 5. Spit any mucus out into a tissue. 6. Rest and do the steps again 1 or 2 times as needed. General instructions  Make sure you get all the shots (vaccines) that your doctor recommends. Ask your doctor about a flu shot and a pneumonia shot.  Use oxygen therapy and pulmonary rehabilitation if told by your doctor. If you need home oxygen therapy, ask your doctor if you should buy a tool to measure your oxygen level (oximeter).  Make a COPD action plan with your doctor. This helps you to know what to do if you feel worse than usual.  Manage any other conditions you have as told by your doctor.  Avoid going outside when it is very hot, cold, or humid.  Avoid people who have a sickness you can catch (contagious).  Keep all follow-up visits as told by your doctor. This is important. Contact a doctor if:  You cough up more mucus than usual.  There is a change in the color or thickness of the mucus.  It is harder to breathe than usual.  Your breathing is faster than usual.  You have trouble sleeping.  You need to use your medicines more often than usual.  You have trouble doing your normal activities such as getting dressed   or walking around the house. Get help right away if:  You have shortness of breath while resting.  You have shortness of breath that stops you from: ? Being able to talk. ? Doing normal activities.  Your chest hurts for longer than 5 minutes.  Your skin color is more blue than usual.  Your pulse oximeter shows that you have low oxygen for longer than 5 minutes.  You have a fever.  You feel too tired to breathe normally. Summary  Chronic obstructive pulmonary disease (COPD) is a long-term lung problem.  The way your  lungs work will never return to normal. Usually the condition gets worse over time. There are things you can do to keep yourself as healthy as possible.  Take over-the-counter and prescription medicines only as told by your doctor.  If you smoke, stop. Smoking makes the problem worse. This information is not intended to replace advice given to you by your health care provider. Make sure you discuss any questions you have with your health care provider. Document Released: 12/13/2007 Document Revised: 07/31/2016 Document Reviewed: 07/31/2016 Elsevier Interactive Patient Education  2019 [employer]  

## 2018-08-29 NOTE — Progress Notes (Signed)
Subjective:    Patient ID: Jason Spencer, male    DOB: 1951/01/15, 68 y.o.   MRN: [id number]  Chief Complaint  Patient presents with  . cough and congestion  . Medical Management of Chronic Issues   PT presents to the office today for chronic follow up. States he has been out of his lisinopril for 2-3 days.  Hypertension  This is a chronic problem. The current episode started more than 1 year ago. The problem has been waxing and waning since onset. The problem is uncontrolled. Associated symptoms include shortness of breath. Pertinent negatives include no headaches, malaise/fatigue or peripheral edema. Risk factors for coronary artery disease include dyslipidemia, male gender, smoking/tobacco exposure and sedentary lifestyle. The current treatment provides mild improvement. Hypertensive end-organ damage includes CAD/MI. There is no history of CVA or heart failure. Identifiable causes of hypertension include a thyroid problem.  Thyroid Problem  Presents for follow-up visit. Patient reports no constipation, diaphoresis, diarrhea, dry skin or fatigue. The symptoms have been stable. His past medical history is significant for hyperlipidemia. There is no history of heart failure.  Cough  This is a chronic problem. The current episode started more than 1 year ago. The problem has been waxing and waning. The problem occurs every few minutes. The cough is non-productive. Associated symptoms include shortness of breath. Pertinent negatives include no headaches. He has tried OTC cough suppressant and rest for the symptoms. His past medical history is significant for COPD.  Hyperlipidemia  This is a chronic problem. The current episode started more than 1 year ago. The problem is uncontrolled. Recent lipid tests were reviewed and are high. Associated symptoms include shortness of breath. Current antihyperlipidemic treatment includes statins. The current treatment provides moderate improvement of lipids.  Risk factors for coronary artery disease include dyslipidemia, male sex, hypertension and a sedentary lifestyle.  A Fib PT taking warfarin. See anticoagulation flow sheet.     Review of Systems  Constitutional: Negative for diaphoresis, fatigue and malaise/fatigue.  Respiratory: Positive for cough and shortness of breath.   Gastrointestinal: Negative for constipation and diarrhea.  Neurological: Negative for headaches.  All other systems reviewed and are negative.      Objective:   Physical Exam Vitals signs reviewed.  Constitutional:      General: He is not in acute distress.    Appearance: He is well-developed.  HENT:     Head: Normocephalic.     Right Ear: Tympanic membrane normal.     Left Ear: Tympanic membrane normal.  Eyes:     General:        Right eye: No discharge.        Left eye: No discharge.     Pupils: Pupils are equal, round, and reactive to light.  Neck:     Musculoskeletal: Normal range of motion and neck supple.     Thyroid: No thyromegaly.  Cardiovascular:     Rate and Rhythm: Normal rate and regular rhythm.     Heart sounds: Normal heart sounds. No murmur.  Pulmonary:     Effort: Pulmonary effort is normal. No respiratory distress.     Breath sounds: Wheezing present.  Abdominal:     General: Bowel sounds are normal. There is no distension.     Palpations: Abdomen is soft.     Tenderness: There is no abdominal tenderness.  Musculoskeletal: Normal range of motion.        General: No tenderness.  Skin:    General: Skin is warm  and dry.     Findings: No erythema or rash.  Neurological:     Mental Status: He is alert and oriented to person, place, and time.     Cranial Nerves: No cranial nerve deficit.     Deep Tendon Reflexes: Reflexes are normal and symmetric.  Psychiatric:        Behavior: Behavior normal.        Thought Content: Thought content normal.        Judgment: Judgment normal.     BP (!) 179/98   Pulse 64   Temp (!) 96 F  (35.6 C) (Oral)   Ht 5' 10" (1.778 m)   Wt 181 lb 6.4 oz (82.3 kg)   BMI 26.03 kg/m      Assessment & Plan:  Jason Spencer comes in today with chief complaint of cough and congestion and Medical Management of Chronic Issues   Diagnosis and orders addressed:  1. Hypothyroidism, unspecified type - levothyroxine (SYNTHROID) 50 MCG tablet; Take 1 tablet (50 mcg total) by mouth daily.  Dispense: 90 tablet; Refill: 1 - CMP14+EGFR - CBC with Differential/Platelet - TSH  2. Essential hypertension, benign Restart lisinopril  - lisinopril (PRINIVIL,ZESTRIL) 40 MG tablet; Take 1 tablet (40 mg total) by mouth daily.  Dispense: 90 tablet; Refill: 3 - metoprolol succinate (TOPROL XL) 100 MG 24 hr tablet; Take 1 tablet (100 mg total) by mouth daily. Take with or immediately following a meal.  Dispense: 90 tablet; Refill: 1 - CMP14+EGFR - CBC with Differential/Platelet  3. Atrial fibrillation, new onset w/ RVR - metoprolol succinate (TOPROL XL) 100 MG 24 hr tablet; Take 1 tablet (100 mg total) by mouth daily. Take with or immediately following a meal.  Dispense: 90 tablet; Refill: 1 - warfarin (COUMADIN) 5 MG tablet; Take 1 and 1/2 tablets on mondays, wednesdays and fridays.  Take 1 tablet all other days.  Dispense: 50 tablet; Refill: 3 - CMP14+EGFR - CBC with Differential/Platelet - CoaguChek XS/INR Waived  4. Atrial fibrillation, unspecified type (HCC) - warfarin (COUMADIN) 5 MG tablet; Take 1 and 1/2 tablets on mondays, wednesdays and fridays.  Take 1 tablet all other days.  Dispense: 50 tablet; Refill: 3 - CMP14+EGFR - CBC with Differential/Platelet  5. 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  Take 1 tablet all other days.  Dispense: 50 tablet; Refill: 3 - CMP14+EGFR - CBC with Differential/Platelet  6. COPD exacerbation (HCC -Smoking  cessation discussed - CMP14+EGFR - CBC with Differential/Platelet - predniSONE (STERAPRED UNI-PAK 21 TAB) 10 MG (21) TBPK tablet; Use as directed  Dispense: 21 tablet; Refill: 0  7. Tobacco abuse - CMP14+EGFR - CBC with Differential/Platelet  8. Hyperlipidemia, unspecified hyperlipidemia type - CMP14+EGFR - CBC with Differential/Platelet - Lipid panel  9. Anticoagulant long-term use Description   Start taking 7.5 mg on Monday and Friday and 5 mg all other days.   INR today is 1.9  (goal 2-3)    Will keep warfarin the same dosing  - CMP14+EGFR - CBC with Differential/Platelet - CoaguChek XS/INR Waived  10. Overweight (BMI 25.0-29.9)  11. Atrial fibrillation with RVR ([place])   Labs pending Health Maintenance reviewed Diet and exercise encouraged  Follow up plan: 2 weeks to recheck INR and HTN  [name], FNP

## 2018-08-30 LAB — CBC WITH DIFFERENTIAL/PLATELET
BASOS ABS: 0.1 10*3/uL (ref 0.0–0.2)
Basos: 1 %
EOS (ABSOLUTE): 0 10*3/uL (ref 0.0–0.4)
EOS: 0 %
HEMATOCRIT: 46.3 % (ref 37.5–51.0)
HEMOGLOBIN: 16.2 g/dL (ref 13.0–17.7)
Immature Grans (Abs): 0.1 10*3/uL (ref 0.0–0.1)
Immature Granulocytes: 1 %
LYMPHS ABS: 1.3 10*3/uL (ref 0.7–3.1)
Lymphs: 13 %
MCH: 34.2 pg — AB (ref 26.6–33.0)
MCHC: 35 g/dL (ref 31.5–35.7)
MCV: 98 fL — ABNORMAL HIGH (ref 79–97)
MONOCYTES: 18 %
MONOS ABS: 1.9 10*3/uL — AB (ref 0.1–0.9)
Neutrophils Absolute: 7 10*3/uL (ref 1.4–7.0)
Neutrophils: 67 %
Platelets: 229 10*3/uL (ref 150–450)
RBC: 4.74 x10E6/uL (ref 4.14–5.80)
RDW: 11.6 % (ref 11.6–15.4)
WBC: 10.4 10*3/uL (ref 3.4–10.8)

## 2018-08-30 LAB — CMP14+EGFR
ALBUMIN: 4 g/dL (ref 3.8–4.8)
ALK PHOS: 73 IU/L (ref 39–117)
ALT: 20 IU/L (ref 0–44)
AST: 33 IU/L (ref 0–40)
Albumin/Globulin Ratio: 1 — ABNORMAL LOW (ref 1.2–2.2)
BILIRUBIN TOTAL: 1.2 mg/dL (ref 0.0–1.2)
BUN / CREAT RATIO: 12 (ref 10–24)
BUN: 12 mg/dL (ref 8–27)
CHLORIDE: 95 mmol/L — AB (ref 96–106)
CO2: 24 mmol/L (ref 20–29)
Calcium: 9.1 mg/dL (ref 8.6–10.2)
Creatinine, Ser: 1.03 mg/dL (ref 0.76–1.27)
GFR calc Af Amer: 88 mL/min/{1.73_m2} (ref >59)
GFR calc non Af Amer: 76 mL/min/{1.73_m2} (ref >59)
GLOBULIN, TOTAL: 4.1 g/dL (ref 1.5–4.5)
GLUCOSE: 101 mg/dL — AB (ref 65–99)
Potassium: 4.6 mmol/L (ref 3.5–5.2)
SODIUM: 136 mmol/L (ref 134–144)
Total Protein: 8.1 g/dL (ref 6.0–8.5)

## 2018-08-30 LAB — LIPID PANEL
CHOL/HDL RATIO: 3.4 ratio (ref 0.0–5.0)
Cholesterol, Total: 209 mg/dL — ABNORMAL HIGH (ref 100–199)
HDL: 61 mg/dL (ref >39)
LDL CALC: 116 mg/dL — AB (ref 0–99)
Triglycerides: 160 mg/dL — ABNORMAL HIGH (ref 0–149)
VLDL Cholesterol Cal: 32 mg/dL (ref 5–40)

## 2018-08-30 LAB — TSH: TSH: 2.86 u[IU]/mL (ref 0.450–4.500)

## 2018-09-03 ENCOUNTER — Ambulatory Visit: Payer: BLUE CROSS/BLUE SHIELD | Admitting: Family

## 2018-09-12 ENCOUNTER — Encounter: Payer: Self-pay | Admitting: Family

## 2018-09-12 ENCOUNTER — Ambulatory Visit (INDEPENDENT_AMBULATORY_CARE_PROVIDER_SITE_OTHER): Payer: Medicare HMO | Admitting: Family

## 2018-09-12 VITALS — BP 145/75 | HR 86 | Temp 97.3°F | Ht 70.0 in | Wt 184.2 lb

## 2018-09-12 DIAGNOSIS — Z720 Tobacco use: Secondary | ICD-10-CM | POA: Diagnosis not present

## 2018-09-12 DIAGNOSIS — J441 Chronic obstructive pulmonary disease with (acute) exacerbation: Secondary | ICD-10-CM

## 2018-09-12 DIAGNOSIS — I10 Essential (primary) hypertension: Secondary | ICD-10-CM | POA: Diagnosis not present

## 2018-09-12 DIAGNOSIS — Z1212 Encounter for screening for malignant neoplasm of rectum: Secondary | ICD-10-CM

## 2018-09-12 DIAGNOSIS — Z1211 Encounter for screening for malignant neoplasm of colon: Secondary | ICD-10-CM

## 2018-09-12 DIAGNOSIS — Z7901 Long term (current) use of anticoagulants: Secondary | ICD-10-CM

## 2018-09-12 DIAGNOSIS — I4891 Unspecified atrial fibrillation: Secondary | ICD-10-CM

## 2018-09-12 LAB — BMP8+EGFR
BUN/Creatinine Ratio: 9 — ABNORMAL LOW (ref 10–24)
BUN: 7 mg/dL — ABNORMAL LOW (ref 8–27)
CO2: 24 mmol/L (ref 20–29)
Calcium: 8.9 mg/dL (ref 8.6–10.2)
Chloride: 99 mmol/L (ref 96–106)
Creatinine, Ser: 0.75 mg/dL — ABNORMAL LOW (ref 0.76–1.27)
GFR calc Af Amer: 111 mL/min/{1.73_m2} (ref >59)
GFR, EST NON AFRICAN AMERICAN: 96 mL/min/{1.73_m2} (ref >59)
Glucose: 84 mg/dL (ref 65–99)
POTASSIUM: 4.3 mmol/L (ref 3.5–5.2)
SODIUM: 136 mmol/L (ref 134–144)

## 2018-09-12 LAB — COAGUCHEK XS/INR WAIVED
INR: 1.5 — AB (ref 0.9–1.1)
Prothrombin Time: 18.1 s

## 2018-09-12 MED ORDER — AMLODIPINE BESYLATE 5 MG PO TABS: 5.0000 mg | ORAL_TABLET | Freq: Every day | ORAL | 3 refills | Status: DC

## 2018-09-12 NOTE — Patient Instructions (Signed)

## 2018-09-12 NOTE — Progress Notes (Signed)
Subjective:    Patient ID: Jason Spencer, male    DOB: 08-17-1950, 68 y.o.   MRN: [id number]  Chief Complaint  Patient presents with  . COPD follow up   Pt presents to the office today to recheck COPD, INR and HTN. Pt was seen on 08/29/18 and had been out of his lisinopril. States he is taking all of his medications now.  Hypertension  This is a chronic problem. The current episode started more than 1 year ago. The problem has been waxing and waning since onset. The problem is uncontrolled. Pertinent negatives include no headaches, peripheral edema or shortness of breath. Risk factors for coronary artery disease include dyslipidemia, diabetes mellitus, male gender, obesity, smoking/tobacco exposure and sedentary lifestyle.  COPD PT continues to smoke 1/2 pack a day. Uses the Breo "some". Discussed importance of taking daily.  A Fib Takes warfarin. See anticoagulation flow sheet.     Review of Systems  Respiratory: Positive for cough and wheezing. Negative for shortness of breath.   Neurological: Negative for headaches.  All other systems reviewed and are negative.      Objective:   Physical Exam Vitals signs reviewed.  Constitutional:      General: He is not in acute distress.    Appearance: He is well-developed.  HENT:     Head: Normocephalic.     Right Ear: Tympanic membrane normal.     Left Ear: Tympanic membrane normal.  Eyes:     General:        Right eye: No discharge.        Left eye: No discharge.     Pupils: Pupils are equal, round, and reactive to light.  Neck:     Musculoskeletal: Normal range of motion and neck supple.     Thyroid: No thyromegaly.  Cardiovascular:     Rate and Rhythm: Normal rate and regular rhythm.     Heart sounds: Normal heart sounds. No murmur.  Pulmonary:     Effort: Pulmonary effort is normal. No respiratory distress.     Breath sounds: Wheezing present.  Abdominal:     General: Bowel sounds are normal. There is no distension.       Palpations: Abdomen is soft.     Tenderness: There is no abdominal tenderness.  Musculoskeletal: Normal range of motion.        General: No tenderness.  Neurological:     Mental Status: He is alert and oriented to person, place, and time.     Cranial Nerves: No cranial nerve deficit.     Deep Tendon Reflexes: Reflexes are normal and symmetric.  Psychiatric:        Behavior: Behavior normal.        Thought Content: Thought content normal.        Judgment: Judgment normal.       BP (!) 145/75   Pulse 86   Temp (!) 97.3 F (36.3 C) (Oral)   Ht 5' 10"  (1.778 m)   Wt 184 lb 3.2 oz (83.6 kg)   BMI 26.43 kg/m      Assessment & Plan:  Jason Spencer comes in today with chief complaint of COPD follow up   Diagnosis and orders addressed:  1. Atrial fibrillation with RVR (HCC) - CoaguChek XS/INR Waived - BMP8+EGFR  2. Essential hypertension, benign Add Norvasc 5 mg -Dash diet information given -Exercise encouraged - Stress Management  -Continue current meds -RTO in 2 weeks  - BMP8+EGFR - amLODipine (NORVASC) 5 MG  tablet; Take 1 tablet (5 mg total) by mouth daily.  Dispense: 90 tablet; Refill: 3  3. COPD exacerbation ([place]) Start taking Breo daily!! - BMP8+EGFR  4. Tobacco abuse Smoking cessation discussed - BMP8+EGFR  5. Current use of anticoagulant therapy Description   Take 7.5 mg today (09/12/18) then increase to 7.5 mg on  Monday, Wednesday, and Friday and 5 mg all other days.   INR today is 1.5  (goal 2-3) Too thick.     - CoaguChek XS/INR Waived - BMP8+EGFR  6. Colon cancer screening - Cologuard  7. Screening for malignant neoplasm of the rectum - Cologuard   Labs pending Health Maintenance reviewed Diet and exercise encouraged  Follow up plan: 2 weeks to recheck HTN and INR  [name], FNP

## 2018-09-26 ENCOUNTER — Encounter: Payer: Self-pay | Admitting: Family

## 2018-09-26 ENCOUNTER — Other Ambulatory Visit: Payer: Self-pay

## 2018-09-26 ENCOUNTER — Ambulatory Visit (INDEPENDENT_AMBULATORY_CARE_PROVIDER_SITE_OTHER): Payer: Medicare HMO | Admitting: Family

## 2018-09-26 VITALS — BP 147/82 | HR 77 | Temp 97.8°F | Ht 70.0 in | Wt 186.4 lb

## 2018-09-26 DIAGNOSIS — Z7901 Long term (current) use of anticoagulants: Secondary | ICD-10-CM | POA: Diagnosis not present

## 2018-09-26 DIAGNOSIS — I4891 Unspecified atrial fibrillation: Secondary | ICD-10-CM

## 2018-09-26 DIAGNOSIS — Z720 Tobacco use: Secondary | ICD-10-CM | POA: Diagnosis not present

## 2018-09-26 DIAGNOSIS — I10 Essential (primary) hypertension: Secondary | ICD-10-CM

## 2018-09-26 DIAGNOSIS — J449 Chronic obstructive pulmonary disease, unspecified: Secondary | ICD-10-CM | POA: Insufficient documentation

## 2018-09-26 DIAGNOSIS — J42 Unspecified chronic bronchitis: Secondary | ICD-10-CM | POA: Diagnosis not present

## 2018-09-26 LAB — COAGUCHEK XS/INR WAIVED
INR: 2.8 — ABNORMAL HIGH (ref 0.9–1.1)
Prothrombin Time: 33.3 s

## 2018-09-26 MED ORDER — BETAMETHASONE DIPROPIONATE AUG 0.05 % EX CREA: TOPICAL_CREAM | Freq: Two times a day (BID) | CUTANEOUS | 2 refills | Status: DC

## 2018-09-26 NOTE — Patient Instructions (Signed)

## 2018-09-26 NOTE — Progress Notes (Signed)
Subjective:    Patient ID: Jason Spencer, male    DOB: October 14, 1950, 68 y.o.   MRN: [id number]  Chief Complaint  Patient presents with  . Hypertension    recheck   Pt presents to the office today to recheck HTN and INR. PT's BP is slightly elevated today.  Hypertension  This is a chronic problem. The current episode started more than 1 year ago. The problem has been waxing and waning since onset. The problem is uncontrolled. Associated symptoms include malaise/fatigue and shortness of breath. Pertinent negatives include no headaches or peripheral edema. Past treatments include calcium channel blockers and beta blockers. The current treatment provides mild improvement.  A Fib Pt takes warfarin. See anticoagulation flow sheet.  COPD PT states he takes his Breo as needed. Continues to smoke.    Review of Systems  Constitutional: Positive for malaise/fatigue.  Respiratory: Positive for shortness of breath and wheezing.   Neurological: Negative for headaches.  All other systems reviewed and are negative.      Objective:   Physical Exam Vitals signs reviewed.  Constitutional:      General: He is not in acute distress.    Appearance: He is well-developed.  HENT:     Head: Normocephalic.     Right Ear: External ear normal.     Left Ear: External ear normal.  Eyes:     General:        Right eye: No discharge.        Left eye: No discharge.     Pupils: Pupils are equal, round, and reactive to light.  Neck:     Musculoskeletal: Normal range of motion and neck supple.     Thyroid: No thyromegaly.  Cardiovascular:     Rate and Rhythm: Normal rate and regular rhythm.     Heart sounds: Normal heart sounds. No murmur.  Pulmonary:     Effort: Pulmonary effort is normal. No respiratory distress.     Breath sounds: Decreased breath sounds and wheezing present.  Abdominal:     General: Bowel sounds are normal. There is no distension.     Palpations: Abdomen is soft.     Tenderness:  There is no abdominal tenderness.  Musculoskeletal: Normal range of motion.        General: No tenderness.  Skin:    General: Skin is warm and dry.     Findings: No erythema or rash.  Neurological:     Mental Status: He is alert and oriented to person, place, and time.     Cranial Nerves: No cranial nerve deficit.     Deep Tendon Reflexes: Reflexes are normal and symmetric.  Psychiatric:        Behavior: Behavior normal.        Thought Content: Thought content normal.        Judgment: Judgment normal.       BP (!) 147/82   Pulse 77   Temp 97.8 F (36.6 C) (Oral)   Ht 5\' 10"  (1.778 m)   Wt 186 lb 6.4 oz (84.6 kg)   BMI 26.75 kg/m      Assessment & Plan:  Jason Spencer comes in today with chief complaint of Hypertension (recheck)   Diagnosis and orders addressed:  1. Essential hypertension, benign Continue medications  -Dash diet information given -Exercise encouraged - Stress Management  -Continue current meds  2. Atrial fibrillation with RVR (HCC) Description   Continue 7.5 mg on  Monday, Wednesday, and Friday and 5  mg all other days.   INR today is 2.8  (goal 2-3) Perfect!!     - CoaguChek XS/INR Waived  3. Current use of anticoagulant therapy  4. Tobacco abuse Smoking cessation discussed  5. Chronic bronchitis, unspecified chronic bronchitis type (HCC) Start taking Breo daily   Labs pending Health Maintenance reviewed Diet and exercise encouraged  Follow up plan: 2 months    Jason Rodney, FNP

## 2018-11-25 ENCOUNTER — Telehealth: Payer: Self-pay

## 2018-11-25 NOTE — Telephone Encounter (Signed)
Pt said that [facility] did a laser procedure on him in November and he was concerned because an area near where the procedure was done has now turned red and is sore. He said taht this has been going on for about 3 weeks. He felt like it was just time to have it checked  Advised pt that I would speak with [facility] tomorrow and we would call him to arrange an appt if needed. He denies any fevers and leg is not really different in temp. He does state that he has some swelling around the ankle.   Schlerotherapy was recommended at last visit. Will discuss with Sonya and call pt tomorrow. Pt was ok with this plan.   Jason Spencer, CMA

## 2018-11-26 ENCOUNTER — Other Ambulatory Visit: Payer: Self-pay | Admitting: *Deleted

## 2018-11-26 ENCOUNTER — Telehealth: Payer: Self-pay | Admitting: *Deleted

## 2018-11-26 DIAGNOSIS — I83811 Varicose veins of right lower extremities with pain: Secondary | ICD-10-CM

## 2018-11-26 NOTE — Telephone Encounter (Signed)
Following up with Jason Spencer's telephone triage call with Jason Spencer 11-25-2018.  Jason Spencer is s/p endovenous laser ablation right greater saphenous vein 05-01-2018 by Dr. [name].  Jason Spencer is currently c/o right leg pain, redness and swelling (primarlily in right posterior calf) for 2 1/2 weeks.  Jason Spencer denies fever/temperature differences in right leg) and denies right leg drainage or skin breakdown.  Jason Spencer denies trauma to right leg or falls. Scheduled venous reflux (right leg, order in Epic) and VV FU with Dr. Darrick Penna on 11-28-2018 at 11:00AM/11:30AM).  Patient in agreement with this plan.

## 2018-11-28 ENCOUNTER — Other Ambulatory Visit: Payer: Self-pay

## 2018-11-28 ENCOUNTER — Encounter: Payer: Self-pay | Admitting: Vascular Surgery

## 2018-11-28 ENCOUNTER — Ambulatory Visit (HOSPITAL_COMMUNITY)
Admission: RE | Admit: 2018-11-28 | Discharge: 2018-11-28 | Disposition: A | Discharge status: Home / Self Care | Payer: Medicare HMO | Source: Ambulatory Visit | Attending: Vascular Surgery | Admitting: Vascular Surgery

## 2018-11-28 ENCOUNTER — Ambulatory Visit (INDEPENDENT_AMBULATORY_CARE_PROVIDER_SITE_OTHER): Payer: Self-pay | Admitting: Vascular Surgery

## 2018-11-28 ENCOUNTER — Other Ambulatory Visit: Payer: Self-pay | Admitting: Family

## 2018-11-28 VITALS — BP 145/80 | HR 120 | Temp 98.0°F | Resp 18 | Ht 70.0 in | Wt 186.0 lb

## 2018-11-28 DIAGNOSIS — I83811 Varicose veins of right lower extremities with pain: Secondary | ICD-10-CM | POA: Diagnosis not present

## 2018-11-28 DIAGNOSIS — S8011XA Contusion of right lower leg, initial encounter: Secondary | ICD-10-CM

## 2018-11-28 NOTE — Progress Notes (Signed)
Patient is a 68 year old male who presented today with a two-week history of complaints of pain and redness in his right calf region.  The patient previously had a laser ablation of his right greater saphenous vein.  This was in November 2019.  He states he has been wearing his compression but has gone to a lighter weight stocking.  He denies any significant trauma.  He is on warfarin for atrial fibrillation.  Review of systems: He denies any shortness of breath.  He denies any chest pain.  He denies any fever or chills.  Physical exam:  Vitals:   11/28/18 1155  BP: (!) 145/80  Pulse: (!) 120  Resp: 18  Temp: 98 F (36.7 C)  TempSrc: Oral  SpO2: 96%  Weight: 186 lb (84.4 kg)  Height: 5\' 10"  (1.778 m)    Right lower extremity edematous right calf with some surrounding erythema/bluish discoloration mildly tender to palpation nonfluctuant    Data: Patient had a duplex ultrasound today and reflux study.  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  There is a 5 x 3 cm heterogeneous mass over the area of tenderness most likely consistent with a hematoma  Assessment: Right calf hematoma most likely spontaneous from his warfarin therapy.  Plan: Patient was advised to continue wearing his compression garment to assist with hemostasis.  He will use ice for symptomatic relief.  He will elevate the leg when possible.  We will stop his warfarin today for 5 days.  He will follow-up with Jannifer Rodney in 5 to 7 days to make sure he has had improvement or resolution of symptoms and to determine when to restart his warfarin  I did discuss with the patient that overall the risk of stopping his warfarin for a few days should be fairly low but that there was not 0 risk associated with this.  He understands that there is a small chance of  forming thrombus or distal emboli. CHADS 2 low risk  He will follow-up with [place] on an as-needed basis.  [name], MD [facility] Office: [phone] Pager: [phone]

## 2018-12-03 ENCOUNTER — Ambulatory Visit: Payer: Medicare HMO | Admitting: Family Medicine

## 2018-12-27 ENCOUNTER — Ambulatory Visit: Payer: Medicare HMO | Admitting: Family

## 2019-01-01 ENCOUNTER — Ambulatory Visit (INDEPENDENT_AMBULATORY_CARE_PROVIDER_SITE_OTHER): Payer: Medicare HMO | Admitting: *Deleted

## 2019-01-01 NOTE — Progress Notes (Signed)
Called current phone number for patient with no answer or voice mail.  Patient needs to schedule a follow up appointment to have  A protime, INR recheck.  Erroneous encounter - disregard.

## 2019-01-15 ENCOUNTER — Ambulatory Visit (INDEPENDENT_AMBULATORY_CARE_PROVIDER_SITE_OTHER): Payer: Medicare HMO | Admitting: Physician Assistant

## 2019-01-15 ENCOUNTER — Other Ambulatory Visit: Payer: Self-pay

## 2019-01-15 DIAGNOSIS — I4891 Unspecified atrial fibrillation: Secondary | ICD-10-CM | POA: Diagnosis not present

## 2019-01-15 LAB — COAGUCHEK XS/INR WAIVED
INR: 1.5 — ABNORMAL HIGH (ref 0.9–1.1)
Prothrombin Time: 18.5 s

## 2019-01-15 NOTE — Progress Notes (Signed)
      Telephone visit  Subjective: CC: Anticoagulation visit PCP: Jason Balloon, FNP [id number] [name] is a 68 y.o. male calls for telephone consult today. Patient provides verbal consent for consult held via phone.  Patient is identified with 2 separate identifiers.  At this time the entire area is on COVID-19 social distancing and stay home orders are in place.  Patient is of higher risk and therefore we are performing this by a virtual method.  Location of patient: Home Location of provider: [facility] Others present for call: None  Current 7.5 mg on  Monday andFriday and 5 mg all other days.   INR today is 1.5  (goal 2-3)   At this time you are below goal if she would please increase your dosing to 7.5 mg Monday Wednesday and Friday and 5 mg all the other days.  Recheck in 2 weeks with Jason Spencer.   Have called the telephone number in the record and it was busy at 1:46 PM, 1:47 PM, 1:48 PM, 1:57 PM   After evaluation speaking with the patient he denies any issues with extra bleeding, bruising.  He understands the change in his regimen and will follow-up in a couple weeks.  ROS: Per HPI  Allergies  Allergen Reactions  . Shellfish Allergy Nausea And Vomiting   Past Medical History:  Diagnosis Date  . Acute CHF ([place])    Jason Spencer 08/13/2014  . Atrial fibrillation with RVR ([place]) 08/13/2014   new onset/notes 08/13/2014  . CAP (community acquired pneumonia) 07/2014  . Cardiomyopathy ([place])   . Migraine 1970     Current Outpatient Medications:  .  acetaminophen (TYLENOL) 325 MG tablet, Take 2 tablets (650 mg total) by mouth every 4 (four) hours as needed for headache or mild pain., Disp: , Rfl:  .  amLODipine (NORVASC) 5 MG tablet, Take 1 tablet (5 mg total) by mouth daily., Disp: 90 tablet, Rfl: 3 .  augmented betamethasone dipropionate (DIPROLENE-AF) 0.05 % cream, Apply topically 2 (two) times daily., Disp: 50 g, Rfl: 2 .  fluticasone furoate-vilanterol (BREO ELLIPTA) 100-25  MCG/INH AEPB, Inhale 1 puff into the lungs daily., Disp: 1 each, Rfl: 3 .  levothyroxine (SYNTHROID) 50 MCG tablet, Take 1 tablet (50 mcg total) by mouth daily., Disp: 90 tablet, Rfl: 1 .  lisinopril (PRINIVIL,ZESTRIL) 40 MG tablet, Take 1 tablet (40 mg total) by mouth daily., Disp: 90 tablet, Rfl: 3 .  metoprolol succinate (TOPROL XL) 100 MG 24 hr tablet, Take 1 tablet (100 mg total) by mouth daily. Take with or immediately following a meal., Disp: 90 tablet, Rfl: 1 .  rosuvastatin (CRESTOR) 20 MG tablet, Take 1 tablet (20 mg total) by mouth daily., Disp: 90 tablet, Rfl: 3 .  warfarin (COUMADIN) 5 MG tablet, Take 1 and 1/2 tablets on mondays, wednesdays and fridays.  Take 1 tablet all other days., Disp: 50 tablet, Rfl: 3  Assessment/ Plan: 68 y.o. male   1. Atrial fibrillation with RVR (HCC) - CoaguChek XS/INR Waived   No follow-ups on file.  Continue all other maintenance medications as listed above.  Start time: 3:05 PM End time: 3:14 PM  No orders of the defined types were placed in this encounter.   Particia Nearing PA-C [place] [phone]

## 2019-01-16 ENCOUNTER — Ambulatory Visit: Payer: Medicare HMO | Admitting: Family

## 2019-01-20 ENCOUNTER — Encounter: Payer: Self-pay | Admitting: Physician Assistant

## 2019-02-04 ENCOUNTER — Telehealth: Payer: Self-pay | Admitting: Family

## 2019-02-04 NOTE — Chronic Care Management (AMB) (Signed)
°  Chronic Care Management   Outreach Note  02/04/2019 Name: Jason Spencer MRN: [id number] DOB: March 22, 1951  Referred by: Sharion Balloon, FNP Reason for referral : Chronic Care Management (Initial CCM outreach was unsuccessful.)   An unsuccessful telephone outreach was attempted today. The patient was referred to the case management team by for assistance with chronic care management and care coordination.   Follow Up Plan: The care management team will reach out to the patient again over the next 7 days.   [place]  [email or web address]   ??[phone]

## 2019-02-19 NOTE — Chronic Care Management (AMB) (Signed)
°  Chronic Care Management   Outreach Note  02/19/2019 Name: Nadia Torr MRN: [id number] DOB: [birth year]  Referred by: Sharion Balloon, FNP Reason for referral : Chronic Care Management (Initial CCM outreach was unsuccessful.) and Chronic Care Management (Second CCM outreach was unsuccessful.)   A second unsuccessful telephone outreach was attempted today. The patient was referred to the case management team for assistance with chronic care management and care coordination.   Follow Up Plan: The care management team will reach out to the patient again over the next 7 days.   [place]  [email or web address]   ??[phone]

## 2019-02-24 NOTE — Chronic Care Management (AMB) (Signed)
°  Chronic Care Management   Outreach Note  02/24/2019 Name: Jason Spencer MRN: [id number] DOB: [birth year]  Referred by: Sharion Balloon, FNP Reason for referral : Chronic Care Management (Initial CCM outreach was unsuccessful.), Chronic Care Management (Second CCM outreach was unsuccessful.), and Chronic Care Management (Third CCM outreach was unsuccessful )   Third unsuccessful telephone outreach was attempted today. The patient was referred to the case management team for assistance with chronic care management and care coordination. The patient's primary care provider has been notified of our unsuccessful attempts to make or maintain contact with the patient. The care management team is pleased to engage with this patient at any time in the future should he/she be interested in assistance from the care management team.   Follow Up Plan: The care management team is available to follow up with the patient after provider conversation with the patient regarding recommendation for care management engagement and subsequent re-referral to the care management team.   [place]  [email or web address]   ??[phone]

## 2019-03-14 ENCOUNTER — Other Ambulatory Visit: Payer: Self-pay | Admitting: Family

## 2019-03-14 DIAGNOSIS — I2510 Atherosclerotic heart disease of native coronary artery without angina pectoris: Secondary | ICD-10-CM

## 2019-03-14 DIAGNOSIS — I4891 Unspecified atrial fibrillation: Secondary | ICD-10-CM

## 2019-04-30 ENCOUNTER — Telehealth: Payer: Self-pay | Admitting: Family

## 2019-05-01 ENCOUNTER — Encounter: Payer: Self-pay | Admitting: Family

## 2019-05-01 ENCOUNTER — Other Ambulatory Visit: Payer: Self-pay

## 2019-05-01 ENCOUNTER — Ambulatory Visit (INDEPENDENT_AMBULATORY_CARE_PROVIDER_SITE_OTHER): Payer: Medicare HMO | Admitting: Family

## 2019-05-01 VITALS — BP 177/98 | HR 93 | Temp 97.5°F | Ht 70.0 in | Wt 178.0 lb

## 2019-05-01 DIAGNOSIS — I10 Essential (primary) hypertension: Secondary | ICD-10-CM | POA: Diagnosis not present

## 2019-05-01 DIAGNOSIS — I2510 Atherosclerotic heart disease of native coronary artery without angina pectoris: Secondary | ICD-10-CM

## 2019-05-01 DIAGNOSIS — E039 Hypothyroidism, unspecified: Secondary | ICD-10-CM

## 2019-05-01 DIAGNOSIS — I4891 Unspecified atrial fibrillation: Secondary | ICD-10-CM

## 2019-05-01 DIAGNOSIS — J42 Unspecified chronic bronchitis: Secondary | ICD-10-CM | POA: Diagnosis not present

## 2019-05-01 DIAGNOSIS — Z7901 Long term (current) use of anticoagulants: Secondary | ICD-10-CM

## 2019-05-01 DIAGNOSIS — E785 Hyperlipidemia, unspecified: Secondary | ICD-10-CM | POA: Diagnosis not present

## 2019-05-01 DIAGNOSIS — Z720 Tobacco use: Secondary | ICD-10-CM | POA: Diagnosis not present

## 2019-05-01 DIAGNOSIS — F411 Generalized anxiety disorder: Secondary | ICD-10-CM | POA: Insufficient documentation

## 2019-05-01 DIAGNOSIS — E663 Overweight: Secondary | ICD-10-CM

## 2019-05-01 LAB — COAGUCHEK XS/INR WAIVED
INR: 1.6 — ABNORMAL HIGH (ref 0.9–1.1)
Prothrombin Time: 19.1 s

## 2019-05-01 MED ORDER — BETAMETHASONE DIPROPIONATE AUG 0.05 % EX CREA: TOPICAL_CREAM | Freq: Two times a day (BID) | CUTANEOUS | 2 refills | Status: DC

## 2019-05-01 MED ORDER — METOPROLOL SUCCINATE ER 100 MG PO TB24: 100.0000 mg | ORAL_TABLET | Freq: Every day | ORAL | 1 refills | Status: DC

## 2019-05-01 MED ORDER — WARFARIN SODIUM 5 MG PO TABS: ORAL_TABLET | ORAL | 0 refills | Status: DC

## 2019-05-01 MED ORDER — LEVOTHYROXINE SODIUM 50 MCG PO TABS: 50.0000 ug | ORAL_TABLET | Freq: Every day | ORAL | 1 refills | Status: DC

## 2019-05-01 MED ORDER — BUSPIRONE HCL 7.5 MG PO TABS: 7.5000 mg | ORAL_TABLET | Freq: Three times a day (TID) | ORAL | 1 refills | Status: DC | PRN

## 2019-05-01 NOTE — Progress Notes (Signed)
Subjective:    Patient ID: Jason Spencer, male    DOB: 09-13-1950, 68 y.o.   MRN: [id number]  Chief Complaint  Patient presents with   protime recheck   PT presents to the office today for chronic follow up. He states he "ran out" of his warfarin and missed a dose yesterday. See anticoagulation flow sheet.   His BP is elevated today, but states he "forgot" his metoprolol 100 mg yesterday.  Hypertension This is a chronic problem. The current episode started more than 1 year ago. The problem has been waxing and waning since onset. The problem is uncontrolled. Associated symptoms include anxiety. Pertinent negatives include no headaches, malaise/fatigue, peripheral edema or shortness of breath. Risk factors for coronary artery disease include male gender, obesity, sedentary lifestyle and smoking/tobacco exposure. The current treatment provides no improvement. Hypertensive end-organ damage includes CAD/MI. Identifiable causes of hypertension include a thyroid problem.  Nicotine Dependence Presents for follow-up visit. Symptoms include decreased concentration and irritability. Symptoms are negative for fatigue. His urge triggers include company of smokers. He smokes 1 pack of cigarettes per day.  Hyperlipidemia This is a chronic problem. The current episode started more than 1 year ago. The problem is uncontrolled. Recent lipid tests were reviewed and are high. Exacerbating diseases include obesity. Pertinent negatives include no shortness of breath. Current antihyperlipidemic treatment includes statins. The current treatment provides mild improvement of lipids. Risk factors for coronary artery disease include dyslipidemia, hypertension, male sex and post-menopausal.  Anxiety Presents for initial visit. The problem has been gradually worsening. Symptoms include decreased concentration, depressed mood, excessive worry, irritability, nervous/anxious behavior, panic and restlessness. Patient reports  no dizziness or shortness of breath.    Thyroid Problem Presents for follow-up visit. Symptoms include anxiety and depressed mood. Patient reports no fatigue. The symptoms have been stable. His past medical history is significant for hyperlipidemia.  COPD Has SOB and continues to smoke a pack a day. Does not use Breo in "awhile".     Review of Systems  Constitutional: Positive for irritability. Negative for fatigue and malaise/fatigue.  Respiratory: Negative for shortness of breath.   Neurological: Negative for dizziness and headaches.  Psychiatric/Behavioral: Positive for decreased concentration. The patient is nervous/anxious.   All other systems reviewed and are negative.      Objective:   Physical Exam Vitals signs reviewed.  Constitutional:      General: He is not in acute distress.    Appearance: He is well-developed.  HENT:     Head: Normocephalic.     Right Ear: External ear normal. There is impacted cerumen.     Left Ear: External ear normal. There is impacted cerumen.  Eyes:     General:        Right eye: No discharge.        Left eye: No discharge.     Pupils: Pupils are equal, round, and reactive to light.  Neck:     Musculoskeletal: Normal range of motion and neck supple.     Thyroid: No thyromegaly.  Cardiovascular:     Rate and Rhythm: Normal rate. Rhythm irregular.     Heart sounds: Normal heart sounds. No murmur.  Pulmonary:     Effort: Pulmonary effort is normal. No respiratory distress.     Breath sounds: Decreased breath sounds present. No wheezing.     Comments: Intermittent coarse cough Abdominal:     General: Bowel sounds are normal. There is no distension.     Palpations: Abdomen is  soft.     Tenderness: There is no abdominal tenderness.  Musculoskeletal: Normal range of motion.        General: No tenderness.  Skin:    General: Skin is warm and dry.     Findings: No erythema or rash.  Neurological:     Mental Status: He is alert and  oriented to person, place, and time.     Cranial Nerves: No cranial nerve deficit.     Deep Tendon Reflexes: Reflexes are normal and symmetric.  Psychiatric:        Mood and Affect: Mood is anxious.        Behavior: Behavior normal.        Thought Content: Thought content normal.        Judgment: Judgment normal.       BP (!) 180/94    Pulse 93    Temp (!) 97.5 F (36.4 C) (Temporal)    Ht _0  (1.778 m)    Wt 178 lb (80.7 kg)    SpO2 96%    BMI 25.54 kg/m      Assessment & Plan:  Jason Spencer comes in today with chief complaint of protime recheck   Diagnosis and orders addressed:  1. Atrial fibrillation, unspecified type [facility]) Description   Increase 7.5 mg on  Monday, Wednesday, and, Friday and 5 mg all other days.   INR today is 1.6  (goal 2-3) (too thick)  At this time you are below goal if she would please increase your dosing to 7.5 mg Monday Wednesday and Friday and 5 mg all the other days.  Recheck in 2 weeks with [name].     Will add 7.5 mg on Wednesday from 5 mg.  - warfarin (COUMADIN) 5 MG tablet; TAKE 1 TABLET DAILY. TAKE EXTRA 1/2 TABLET ON MONDAY, WEDNESDAY, FRIDAY  Dispense: 50 tablet; Refill: 0 - Ambulatory referral to Cardiology - CMP14+EGFR - CBC with Differential/Platelet  2. Atrial fibrillation, new onset w/ RVR - warfarin (COUMADIN) 5 MG tablet; TAKE 1 TABLET DAILY. TAKE EXTRA 1/2 TABLET ON MONDAY, WEDNESDAY, FRIDAY  Dispense: 50 tablet; Refill: 0 - metoprolol succinate (TOPROL XL) 100 MG 24 hr tablet; Take 1 tablet (100 mg total) by mouth daily. Take with or immediately following a meal.  Dispense: 90 tablet; Refill: 1 - CMP14+EGFR - CBC with Differential/Platelet  3. Coronary artery disease involving native coronary artery of native heart without angina pectoris - warfarin (COUMADIN) 5 MG tablet; TAKE 1 TABLET DAILY. TAKE EXTRA 1/2 TABLET ON MONDAY, WEDNESDAY, FRIDAY  Dispense: 50 tablet; Refill: 0 - Ambulatory referral to Cardiology -  CMP14+EGFR - CBC with Differential/Platelet  4. Hypothyroidism, unspecified type - levothyroxine (SYNTHROID) 50 MCG tablet; Take 1 tablet (50 mcg total) by mouth daily.  Dispense: 90 tablet; Refill: 1 - CMP14+EGFR - CBC with Differential/Platelet - TSH  5. Essential hypertension, benign Pt states he has not taken his metoprolol, will get him to take all of his medications and recheck in 1 week when we recheck INR - metoprolol succinate (TOPROL XL) 100 MG 24 hr tablet; Take 1 tablet (100 mg total) by mouth daily. Take with or immediately following a meal.  Dispense: 90 tablet; Refill: 1 - Ambulatory referral to Cardiology - CMP14+EGFR - CBC with Differential/Platelet  6. Hyperlipidemia, unspecified hyperlipidemia type - Ambulatory referral to Cardiology - CMP14+EGFR - CBC with Differential/Platelet  7. Atrial fibrillation with RVR (HCC) - CMP14+EGFR - CBC with Differential/Platelet  8. Chronic bronchitis, unspecified chronic bronchitis  type ([place]) - CMP14+EGFR - CBC with Differential/Platelet  9. Tobacco abuse - Ambulatory referral to Cardiology - CMP14+EGFR - CBC with Differential/Platelet  10. Overweight (BMI 25.0-29.9) - CMP14+EGFR - CBC with Differential/Platelet  11. Current use of anticoagulant therapy - Ambulatory referral to Cardiology - CMP14+EGFR - CBC with Differential/Platelet  12. GAD (generalized anxiety disorder) Will add Buspar as needed for anxiety  - busPIRone (BUSPAR) 7.5 MG tablet; Take 1 tablet (7.5 mg total) by mouth 3 (three) times daily as needed.  Dispense: 90 tablet; Refill: 1   Labs pending Health Maintenance reviewed Diet and exercise encouraged  Follow up plan: 1 week for INR and HTN   [name], FNP

## 2019-05-01 NOTE — Addendum Note (Signed)
Addended by: Earlene Plater on: 05/01/2019 12:32 PM   Modules accepted: Orders

## 2019-05-01 NOTE — Addendum Note (Signed)
Addended by: [name] A on: 05/01/2019 11:57 AM   Modules accepted: Orders

## 2019-05-01 NOTE — Patient Instructions (Signed)

## 2019-05-02 LAB — CBC WITH DIFFERENTIAL/PLATELET
Basophils Absolute: 0 10*3/uL (ref 0.0–0.2)
Basos: 1 %
EOS (ABSOLUTE): 0.2 10*3/uL (ref 0.0–0.4)
Eos: 2 %
Hematocrit: 45.3 % (ref 37.5–51.0)
Hemoglobin: 15 g/dL (ref 13.0–17.7)
Immature Grans (Abs): 0.1 10*3/uL (ref 0.0–0.1)
Immature Granulocytes: 1 %
Lymphocytes Absolute: 1 10*3/uL (ref 0.7–3.1)
Lymphs: 14 %
MCH: 33 pg (ref 26.6–33.0)
MCHC: 33.1 g/dL (ref 31.5–35.7)
MCV: 100 fL — ABNORMAL HIGH (ref 79–97)
Monocytes Absolute: 1 10*3/uL — ABNORMAL HIGH (ref 0.1–0.9)
Monocytes: 14 %
Neutrophils Absolute: 4.8 10*3/uL (ref 1.4–7.0)
Neutrophils: 68 %
Platelets: 279 10*3/uL (ref 150–450)
RBC: 4.54 x10E6/uL (ref 4.14–5.80)
RDW: 12.4 % (ref 11.6–15.4)
WBC: 7.1 10*3/uL (ref 3.4–10.8)

## 2019-05-02 LAB — CMP14+EGFR
ALT: 29 IU/L (ref 0–44)
AST: 45 IU/L — ABNORMAL HIGH (ref 0–40)
Albumin/Globulin Ratio: 0.8 — ABNORMAL LOW (ref 1.2–2.2)
Albumin: 3.6 g/dL — ABNORMAL LOW (ref 3.8–4.8)
Alkaline Phosphatase: 77 IU/L (ref 39–117)
BUN/Creatinine Ratio: 13 (ref 10–24)
BUN: 10 mg/dL (ref 8–27)
Bilirubin Total: 0.6 mg/dL (ref 0.0–1.2)
CO2: 23 mmol/L (ref 20–29)
Calcium: 8.9 mg/dL (ref 8.6–10.2)
Chloride: 99 mmol/L (ref 96–106)
Creatinine, Ser: 0.77 mg/dL (ref 0.76–1.27)
GFR calc Af Amer: 110 mL/min/{1.73_m2} (ref >59)
GFR calc non Af Amer: 95 mL/min/{1.73_m2} (ref >59)
Globulin, Total: 4.4 g/dL (ref 1.5–4.5)
Glucose: 104 mg/dL — ABNORMAL HIGH (ref 65–99)
Potassium: 4.2 mmol/L (ref 3.5–5.2)
Sodium: 137 mmol/L (ref 134–144)
Total Protein: 8 g/dL (ref 6.0–8.5)

## 2019-05-02 LAB — TSH: TSH: 2.96 u[IU]/mL (ref 0.450–4.500)

## 2019-05-09 ENCOUNTER — Ambulatory Visit: Payer: Medicare HMO | Admitting: Family

## 2019-05-19 DIAGNOSIS — R9431 Abnormal electrocardiogram [ECG] [EKG]: Secondary | ICD-10-CM | POA: Insufficient documentation

## 2019-05-19 NOTE — Progress Notes (Deleted)
Cardiology Office Note   Date:  05/19/2019   ID:  Jason Spencer, DOB Apr 27, 1951, MRN [id number]  PCP:  Jason Spencer, Jason A, FNP  Cardiologist:   No primary care provider on file. Referring:  ***  No chief complaint on file.     History of Present Illness: Jason Spencer is Spencer 68 y.o. male who presents for follow-up of cardiomyopathy, arrhythmia and coronary disease. This is my fourth visit with him. He had Spencer very complex hospitalization in February of this year. At that time he presented with respiratory failure. He was treated for Spencer pneumonia and had Spencer pleural effusion with Spencer VATS. However, he was also in atrial fibrillation with rapid rate. He was found to have dilated cardiomyopathy with an EF of 20%. He had Spencer diagnostic cardiac catheterization and was found to have high-grade right coronary artery stenosis which was treated with stenting. He underwent cardioversion. He was discharged on multiple medications including ACE inhibitor, beta blockers, aspirin and Plavix and warfarin. He ultimately was to be only on warfarin and Plavix for Spencer month and then warfarin alone. He was to be seen in the heart failure clinic but was Spencer no-show for this. [facility]far[facility]    Since I last saw him ***   However, when I saw him at the last visit he was still not having his INR checked routinely.  At the last visit I again expressed the need to have his warfarin check fine with medications and I increased his lisinopril. He's been more compliant with his warfarin and his INR has been therapeutic or near therapeutic. He's taking his lisinopril 10 mg twice Spencer day sometimes sometimes only once Spencer day. He denies any cardiovascular symptoms.The patient denies any new symptoms such as chest discomfort, neck or arm discomfort. There has been no new shortness of breath, PND or orthopnea. There have been no reported palpitations, presyncope or syncope.  He has had no weight gain  or edema.     Past Medical History:  Diagnosis Date  . Acute CHF (HCC)    [name]/notes 08/13/2014  . Atrial fibrillation with RVR (HCC) 08/13/2014   new onset/notes 08/13/2014  . CAP (community acquired pneumonia) 07/2014  . Cardiomyopathy (HCC)   . Migraine 1970     Past Surgical History:  Procedure Laterality Date  . CARDIOVERSION N/Spencer 08/19/2014   Procedure: CARDIOVERSION;  Surgeon: Jason Moralealton S McLean, MD;  [facility];  Service: Cardiovascular;  Laterality: N/Spencer;  . ENDOVENOUS ABLATION SAPHENOUS VEIN W/ LASER Right 05/01/2018   endovenous laser ablation right greater saphenous vein by Jason Brunsharles Fields MD   . LEFT AND RIGHT HEART CATHETERIZATION WITH CORONARY ANGIOGRAM N/Spencer 08/17/2014   Procedure: LEFT AND RIGHT HEART CATHETERIZATION WITH CORONARY ANGIOGRAM;  Surgeon: Jason Jason W Smith III, MD;  [facility];  Service: Cardiovascular;  Laterality: N/Spencer;  . NO PAST SURGERIES    . PERCUTANEOUS CORONARY STENT INTERVENTION (PCI-S)  08/17/2014   Procedure: PERCUTANEOUS CORONARY STENT INTERVENTION (PCI-S);  Surgeon: Jason Jason W Smith III, MD;  [facility];  Service: Cardiovascular;;  . TEE WITHOUT CARDIOVERSION N/Spencer 08/19/2014   Procedure: TRANSESOPHAGEAL ECHOCARDIOGRAM (TEE);  Surgeon: Jason Moralealton S McLean, MD;  [facility];  Service: Cardiovascular;  Laterality: N/Spencer;     Current Outpatient Medications  Medication Sig Dispense Refill  . acetaminophen (TYLENOL) 325 MG tablet Take 2 tablets (650 mg total) by mouth every 4 (four) hours as needed  for headache or mild pain.    Marland Kitchen amLODipine (NORVASC) 5 MG tablet Take 1 tablet (5 mg total) by mouth daily. 90 tablet 3  . augmented betamethasone dipropionate (DIPROLENE-AF) 0.05 % cream Apply topically 2 (two) times daily. 50 g 2  . busPIRone (BUSPAR) 7.5 MG tablet Take 1 tablet (7.5 mg total) by mouth 3 (three) times daily as needed. 90 tablet 1  . fluticasone furoate-vilanterol (BREO ELLIPTA) 100-25 MCG/INH AEPB Inhale 1 puff into the  lungs daily. 1 each 3  . levothyroxine (SYNTHROID) 50 MCG tablet Take 1 tablet (50 mcg total) by mouth daily. 90 tablet 1  . lisinopril (PRINIVIL,ZESTRIL) 40 MG tablet Take 1 tablet (40 mg total) by mouth daily. 90 tablet 3  . metoprolol succinate (TOPROL XL) 100 MG 24 hr tablet Take 1 tablet (100 mg total) by mouth daily. Take with or immediately following Spencer meal. 90 tablet 1  . rosuvastatin (CRESTOR) 20 MG tablet Take 1 tablet (20 mg total) by mouth daily. (Patient not taking: Reported on 05/01/2019) 90 tablet 3  . warfarin (COUMADIN) 5 MG tablet TAKE 1 TABLET DAILY. TAKE EXTRA 1/2 TABLET ON MONDAY, WEDNESDAY, FRIDAY 50 tablet 0   No current facility-administered medications for this visit.     Allergies:   Shellfish allergy    ROS:  Please see the history of present illness.   Otherwise, review of systems are positive for {NONE DEFAULTED:18576::"none"}.   All other systems are reviewed and negative.    PHYSICAL EXAM: VS:  There were no vitals taken for this visit. , BMI There is no height or weight on file to calculate BMI.  GENERAL:  Well appearing NECK:  No jugular venous distention, waveform within normal limits, carotid upstroke brisk and symmetric, no bruits, no thyromegaly LUNGS:  Clear to auscultation bilaterally CHEST:  Unremarkable HEART:  PMI not displaced or sustained,S1 and S2 within normal limits, no S3, no S4, no clicks, no rubs, *** murmurs ABD:  Flat, positive bowel sounds normal in frequency in pitch, no bruits, no rebound, no guarding, no midline pulsatile mass, no hepatomegaly, no splenomegaly EXT:  2 plus pulses throughout, no edema, no cyanosis no clubbing    ***GENERAL:  Well appearing HEENT:  Pupils equal round and reactive, fundi not visualized, oral mucosa unremarkable NECK:  No jugular venous distention, waveform within normal limits, carotid upstroke brisk and symmetric, no bruits, no thyromegaly LYMPHATICS:  No cervical, inguinal adenopathy LUNGS:  Clear  to auscultation bilaterally BACK:  No CVA tenderness CHEST:  Unremarkable HEART:  PMI not displaced or sustained,S1 and S2 within normal limits, no S3, no S4, no clicks, no rubs, *** murmurs ABD:  Flat, positive bowel sounds normal in frequency in pitch, no bruits, no rebound, no guarding, no midline pulsatile mass, no hepatomegaly, no splenomegaly EXT:  2 plus pulses throughout, no edema, no cyanosis no clubbing SKIN:  No rashes no nodules NEURO:  Cranial nerves II through XII grossly intact, motor grossly intact throughout PSYCH:  Cognitively intact, oriented to person place and time    EKG:  EKG {ACTION; IS/IS JWJ:[id number]} ordered today. The ekg ordered today demonstrates ***   Recent Labs: 05/01/2019: ALT 29; BUN 10; Creatinine, Ser 0.77; Hemoglobin 15.0; Platelets 279; Potassium 4.2; Sodium 137; TSH 2.960    Lipid Panel    Component Value Date/Time   CHOL 209 (H) 08/29/2018 1257   TRIG 160 (H) 08/29/2018 1257   HDL 61 08/29/2018 1257   CHOLHDL 3.4 08/29/2018 1257   CHOLHDL  2.4 08/14/2014 0238   VLDL 9 08/14/2014 0238   LDLCALC 116 (H) 08/29/2018 1257      Wt Readings from Last 3 Encounters:  05/01/19 178 lb (80.7 kg)  11/28/18 186 lb (84.4 kg)  09/26/18 186 lb 6.4 oz (84.6 kg)      Other studies Reviewed: Additional studies/ records that were reviewed today include: ***. Review of the above records demonstrates:  Please see elsewhere in the note.  ***   ASSESSMENT AND PLAN:  ATRIAL FLUTTER:  ***   He is now in NSR.     Mr. Safi Culotta has Spencer CHA2DS2 - VASc score of 2 with Spencer risk of stroke of 2.2%.  For now he will continue on the meds as listed.    CAD:   *** He has no ongoing symptoms. He will continue with attempted risk reduction.  CARDIOMYOPATHY:  I suspect this is related more alcohol and rapid rate arrhythmia rather than coronary disease. ***  Reinforced the need to be compliant with medications. Today I'm going to add Spencer low-dose beta blocker.  I  will follow-up with an echocardiogram when I think he is compliant taking his medications routinely and I have uptitrated maximally.  HTN:  This is *** being managed in the context of treating his CHF.  I think this is still elevated because of noncompliance.  ETOH:   ***  I has been counseled on this.   PROLONGED QT:  He needs to avoid any QT prolonging drugs.  *** I am going to start beta blocker. Again we will have Spencer low threshold for ICD evaluation once his meds titrated.  TOBACCO:  ***  The patient understands the need to stop smoking and we have discussed his previously.   ***   Current medicines are reviewed at length with the patient today.  The patient {ACTIONS; HAS/DOES NOT HAVE:19233} concerns regarding medicines.  The following changes have been made:  {PLAN; NO CHANGE:13088:s}  Labs/ tests ordered today include: *** No orders of the defined types were placed in this encounter.    Disposition:   FU with ***    Signed, Rollene Rotunda, MD  05/19/2019 9:21 PM    [facility]

## 2019-05-20 ENCOUNTER — Encounter: Payer: Self-pay | Admitting: Family

## 2019-05-20 ENCOUNTER — Ambulatory Visit (INDEPENDENT_AMBULATORY_CARE_PROVIDER_SITE_OTHER): Payer: Medicare HMO | Admitting: Family

## 2019-05-20 ENCOUNTER — Other Ambulatory Visit: Payer: Self-pay

## 2019-05-20 VITALS — BP 168/89 | HR 97 | Temp 96.4°F | Ht 70.0 in | Wt 182.0 lb

## 2019-05-20 DIAGNOSIS — F17200 Nicotine dependence, unspecified, uncomplicated: Secondary | ICD-10-CM

## 2019-05-20 DIAGNOSIS — I4891 Unspecified atrial fibrillation: Secondary | ICD-10-CM | POA: Diagnosis not present

## 2019-05-20 DIAGNOSIS — I10 Essential (primary) hypertension: Secondary | ICD-10-CM

## 2019-05-20 LAB — COAGUCHEK XS/INR WAIVED
INR: 1.7 — ABNORMAL HIGH (ref 0.9–1.1)
Prothrombin Time: 20.6 s

## 2019-05-20 MED ORDER — AMLODIPINE BESYLATE 10 MG PO TABS: 10.0000 mg | ORAL_TABLET | Freq: Every day | ORAL | 3 refills | Status: DC

## 2019-05-20 NOTE — Progress Notes (Signed)
Subjective:    Patient ID: Arshawn Valdez, male    DOB: Dec 13, 1950, 68 y.o.   MRN: [id number]  Chief Complaint  Patient presents with  . protime recheck   Pt presents to the office today for INR recheck. He states he has not missed doses, bleeding, or bruising. See anticoagulation flow sheet.  Hypertension This is a chronic problem. The current episode started more than 1 year ago. The problem has been waxing and waning since onset. The problem is uncontrolled. Pertinent negatives include no headaches, malaise/fatigue, peripheral edema or shortness of breath. Past treatments include beta blockers, ACE inhibitors and calcium channel blockers. The current treatment provides mild improvement. Hypertensive end-organ damage includes CAD/MI.      Review of Systems  Constitutional: Negative for malaise/fatigue.  Respiratory: Negative for shortness of breath.   Neurological: Negative for headaches.  Psychiatric/Behavioral: The patient is nervous/anxious.   All other systems reviewed and are negative.      Objective:   Physical Exam Vitals signs reviewed.  Constitutional:      General: He is not in acute distress.    Appearance: He is well-developed.  HENT:     Head: Normocephalic.  Eyes:     General:        Right eye: No discharge.        Left eye: No discharge.     Pupils: Pupils are equal, round, and reactive to light.  Neck:     Musculoskeletal: Normal range of motion and neck supple.     Thyroid: No thyromegaly.  Cardiovascular:     Rate and Rhythm: Normal rate and regular rhythm.     Heart sounds: Normal heart sounds. No murmur.  Pulmonary:     Effort: Pulmonary effort is normal. No respiratory distress.     Breath sounds: Normal breath sounds. No wheezing.  Abdominal:     General: Bowel sounds are normal. There is no distension.     Palpations: Abdomen is soft.     Tenderness: There is no abdominal tenderness.  Musculoskeletal: Normal range of motion.      General: No tenderness.  Skin:    General: Skin is warm and dry.     Findings: No erythema or rash.  Neurological:     Mental Status: He is alert and oriented to person, place, and time.     Cranial Nerves: No cranial nerve deficit.     Deep Tendon Reflexes: Reflexes are normal and symmetric.  Psychiatric:        Behavior: Behavior normal.        Thought Content: Thought content normal.        Judgment: Judgment normal.      BP (!) 193/108   Pulse 89   Temp (!) 96.4 F (35.8 C) (Temporal)   Ht 5' 10"  (1.778 m)   Wt 182 lb (82.6 kg)   SpO2 96%   BMI 26.11 kg/m       Assessment & Plan:  Tyger Wichman comes in today with chief complaint of protime recheck   Diagnosis and orders addressed:  1. Atrial fibrillation, new onset w/ RVR - CoaguChek XS/INR Waived - BMP8+EGFR  2. Atrial fibrillation with RVR (HCC) - BMP8+EGFR  3. Current smoker Smoking cessation discussed - BMP8+EGFR  4. Essential hypertension, benign Will increase Norvasc 10 mg  -Daily blood pressure log given with instructions on how to fill out and told to bring to next visit -Dash diet information given -Exercise encouraged - Stress Management  -  Continue current meds -RTO in 2 weeks - amLODipine (NORVASC) 10 MG tablet; Take 1 tablet (10 mg total) by mouth daily.  Dispense: 90 tablet; Refill: 3 - BMP8+EGFR    Follow up plan: 2 weeks   [name], FNP

## 2019-05-20 NOTE — Patient Instructions (Signed)

## 2019-05-21 ENCOUNTER — Ambulatory Visit: Payer: Medicare HMO | Admitting: Cardiology

## 2019-05-21 LAB — BMP8+EGFR
BUN/Creatinine Ratio: 8 — ABNORMAL LOW (ref 10–24)
BUN: 7 mg/dL — ABNORMAL LOW (ref 8–27)
CO2: 24 mmol/L (ref 20–29)
Calcium: 9 mg/dL (ref 8.6–10.2)
Chloride: 100 mmol/L (ref 96–106)
Creatinine, Ser: 0.9 mg/dL (ref 0.76–1.27)
GFR calc Af Amer: 103 mL/min/{1.73_m2} (ref >59)
GFR calc non Af Amer: 89 mL/min/{1.73_m2} (ref >59)
Glucose: 104 mg/dL — ABNORMAL HIGH (ref 65–99)
Potassium: 4.4 mmol/L (ref 3.5–5.2)
Sodium: 137 mmol/L (ref 134–144)

## 2019-06-02 ENCOUNTER — Telehealth: Payer: Self-pay | Admitting: Family

## 2019-06-03 ENCOUNTER — Ambulatory Visit (INDEPENDENT_AMBULATORY_CARE_PROVIDER_SITE_OTHER): Payer: Medicare HMO | Admitting: Family

## 2019-06-03 ENCOUNTER — Encounter: Payer: Self-pay | Admitting: Family

## 2019-06-03 VITALS — BP 153/87 | HR 57 | Temp 96.0°F | Ht 70.0 in | Wt 180.0 lb

## 2019-06-03 DIAGNOSIS — F411 Generalized anxiety disorder: Secondary | ICD-10-CM | POA: Diagnosis not present

## 2019-06-03 DIAGNOSIS — E039 Hypothyroidism, unspecified: Secondary | ICD-10-CM | POA: Diagnosis not present

## 2019-06-03 DIAGNOSIS — E785 Hyperlipidemia, unspecified: Secondary | ICD-10-CM | POA: Diagnosis not present

## 2019-06-03 DIAGNOSIS — I10 Essential (primary) hypertension: Secondary | ICD-10-CM

## 2019-06-03 DIAGNOSIS — Z720 Tobacco use: Secondary | ICD-10-CM

## 2019-06-03 DIAGNOSIS — I5023 Acute on chronic systolic (congestive) heart failure: Secondary | ICD-10-CM | POA: Diagnosis not present

## 2019-06-03 DIAGNOSIS — Z7901 Long term (current) use of anticoagulants: Secondary | ICD-10-CM

## 2019-06-03 DIAGNOSIS — E663 Overweight: Secondary | ICD-10-CM | POA: Diagnosis not present

## 2019-06-03 DIAGNOSIS — J42 Unspecified chronic bronchitis: Secondary | ICD-10-CM | POA: Diagnosis not present

## 2019-06-03 DIAGNOSIS — I4891 Unspecified atrial fibrillation: Secondary | ICD-10-CM

## 2019-06-03 LAB — COAGUCHEK XS/INR WAIVED
INR: 2 — ABNORMAL HIGH (ref 0.9–1.1)
Prothrombin Time: 24.2 s

## 2019-06-03 NOTE — Patient Instructions (Signed)
Description   INR today is 2.0  (goal 2-3) (Perfect)  Continue current dose of  7.5 mg on  Sunday, Monday, Wednesday, and, Friday and 5 mg all other days.

## 2019-06-03 NOTE — Progress Notes (Signed)
Subjective:    Patient ID: Jason Spencer, male    DOB: 11-29-1950, 68 y.o.   MRN: [id number]  Chief Complaint  Patient presents with  . Medical Management of Chronic Issues  . Hypertension   PT presents to the office today for chronic follow up and INR check. He has A fib and takes warfarin. See anticoagulation flow sheet. Denies any bleeding, bruising, or missed doses.  Hypertension This is a chronic problem. The current episode started more than 1 year ago. The problem has been waxing and waning since onset. The problem is uncontrolled. Associated symptoms include anxiety, malaise/fatigue and shortness of breath. Pertinent negatives include no peripheral edema. Identifiable causes of hypertension include a thyroid problem.  Hyperlipidemia This is a chronic problem. The current episode started more than 1 year ago. The problem is uncontrolled. Associated symptoms include shortness of breath. Current antihyperlipidemic treatment includes statins. The current treatment provides moderate improvement of lipids. Risk factors for coronary artery disease include dyslipidemia, male sex and hypertension.  Thyroid Problem Presents for follow-up visit. Symptoms include anxiety and fatigue. Patient reports no constipation or depressed mood. The symptoms have been stable. His past medical history is significant for hyperlipidemia.  Anxiety Presents for follow-up visit. Symptoms include excessive worry, irritability, nervous/anxious behavior, restlessness and shortness of breath. Patient reports no depressed mood.    Nicotine Dependence Presents for follow-up visit. Symptoms include fatigue and irritability. His urge triggers include company of smokers. The symptoms have been stable. He smokes < 1/2 a pack of cigarettes per day.      Review of Systems  Constitutional: Positive for fatigue, irritability and malaise/fatigue.  Respiratory: Positive for shortness of breath.   Gastrointestinal:  Negative for constipation.  Psychiatric/Behavioral: The patient is nervous/anxious.   All other systems reviewed and are negative.      Objective:   Physical Exam Vitals signs reviewed.  Constitutional:      General: He is not in acute distress.    Appearance: He is well-developed.  HENT:     Head: Normocephalic.     Right Ear: Tympanic membrane normal.     Left Ear: Tympanic membrane normal.  Eyes:     General:        Right eye: No discharge.        Left eye: No discharge.     Pupils: Pupils are equal, round, and reactive to light.  Neck:     Musculoskeletal: Normal range of motion and neck supple.     Thyroid: No thyromegaly.  Cardiovascular:     Rate and Rhythm: Normal rate. Rhythm irregular.     Heart sounds: Normal heart sounds. No murmur.  Pulmonary:     Effort: Pulmonary effort is normal. No respiratory distress.     Breath sounds: Rhonchi present. No wheezing.  Abdominal:     General: Bowel sounds are normal. There is no distension.     Palpations: Abdomen is soft.     Tenderness: There is no abdominal tenderness.  Musculoskeletal: Normal range of motion.        General: No tenderness.  Skin:    General: Skin is warm and dry.     Findings: No erythema or rash.  Neurological:     Mental Status: He is alert and oriented to person, place, and time.     Cranial Nerves: No cranial nerve deficit.     Deep Tendon Reflexes: Reflexes are normal and symmetric.  Psychiatric:        Behavior:  Behavior normal.        Thought Content: Thought content normal.        Judgment: Judgment normal.       BP (!) 153/87   Pulse (!) 57   Temp (!) 96 F (35.6 C) (Temporal)   Ht 5\' 10"  (1.778 m)   Wt 180 lb (81.6 kg)   SpO2 96%   BMI 25.83 kg/m      Assessment & Plan:  Jason Spencer comes in today with chief complaint of Medical Management of Chronic Issues and Hypertension   Diagnosis and orders addressed:  1. Acute on chronic systolic CHF (congestive heart  failure) (HCC)  2. Atrial fibrillation with RVR (HCC)  3. Essential hypertension, benign  4. Chronic bronchitis, unspecified chronic bronchitis type (HCC)  5. Hypothyroidism, unspecified type  6. Tobacco abuse  7. Hyperlipidemia, unspecified hyperlipidemia type  8. Overweight (BMI 25.0-29.9)  9. GAD (generalized anxiety disorder)  10. Current use of anticoagulant therapy  11. Atrial fibrillation, new onset w/ RVR - CoaguChek XS/INR Waived   Labs reviewed Pt will call Cardiologists and make appt Health Maintenance reviewed Diet and exercise encouraged  Follow up plan: 1 month    05-12-2001, FNP

## 2019-06-12 ENCOUNTER — Other Ambulatory Visit: Payer: Self-pay | Admitting: Family

## 2019-06-12 DIAGNOSIS — I4891 Unspecified atrial fibrillation: Secondary | ICD-10-CM

## 2019-06-12 DIAGNOSIS — I2510 Atherosclerotic heart disease of native coronary artery without angina pectoris: Secondary | ICD-10-CM

## 2019-07-23 ENCOUNTER — Other Ambulatory Visit: Payer: Self-pay | Admitting: Family

## 2019-07-23 NOTE — Telephone Encounter (Signed)
What is the name of the medication? warfarin (COUMADIN) 5 MG tablet  Have you contacted your pharmacy to request a refill? Yes   Which pharmacy would you like this sent to? [facility]   Patient notified that their request is being sent to the clinical staff for review and that they should receive a call once it is complete. If they do not receive a call within 24 hours they can check with their pharmacy or our office.

## 2019-07-23 NOTE — Telephone Encounter (Signed)
Attempted to contact patient - n/a- needs INR apt

## 2019-07-28 DIAGNOSIS — Z7189 Other specified counseling: Secondary | ICD-10-CM | POA: Insufficient documentation

## 2019-07-28 DIAGNOSIS — I429 Cardiomyopathy, unspecified: Secondary | ICD-10-CM | POA: Insufficient documentation

## 2019-07-28 DIAGNOSIS — F1010 Alcohol abuse, uncomplicated: Secondary | ICD-10-CM | POA: Insufficient documentation

## 2019-07-28 DIAGNOSIS — F1011 Alcohol abuse, in remission: Secondary | ICD-10-CM | POA: Insufficient documentation

## 2019-07-28 DIAGNOSIS — I4892 Unspecified atrial flutter: Secondary | ICD-10-CM | POA: Insufficient documentation

## 2019-07-28 NOTE — Progress Notes (Signed)
Cardiology Office Note   Date:  07/30/2019   ID:  Jason Spencer, DOB Oct 28, 1950, MRN [id number]  PCP:  [name], FNP  Cardiologist:   Rollene Rotunda, MD   Chief Complaint  Patient presents with  . Cardiomyopathy      History of Present Illness: Jason Spencer is a 69 y.o. male who presents for follow-up of cardiomyopathy, arrhythmia and coronary disease. I last saw him in 2016.  I am not sure why he has not come back for follow-up.   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 He had a diagnostic cardiac catheterization and was found to have high-grade right coronary artery stenosis which was treated with stenting.  However, previously he was not really thought to have an ischemic etiology as the coronary disease is out of proportion to the global myopathy.  He is also had nonadherence or noncompliance with lifestyle changes.  He underwent cardioversion.  He is back in atrial fibrillation but he does not really feel this.  He denies any cardiovascular symptoms.  He does have shortness of breath if he works hard but he can split some wood and not get excessively short of breath.  He does not describe PND or orthopnea.  He does not have chest pressure, neck or arm discomfort.  He has had no weight gain or edema.  He is unfortunately still smoking cigarettes and drinks at least a sixpack a night.    Past Medical History:  Diagnosis Date  . Acute CHF (HCC)    [name] 08/13/2014  . Atrial fibrillation with RVR (HCC) 08/13/2014   new onset/notes 08/13/2014  . CAP (community acquired pneumonia) 07/2014  . Cardiomyopathy (HCC)   . Migraine 1970     Past Surgical History:  Procedure Laterality Date  . CARDIOVERSION N/A 08/19/2014   Procedure: CARDIOVERSION;  Surgeon: Laurey Morale, MD;  Location[facility] ENDOSCOPY;  Service: Cardiovascular;  Laterality: N/A;  . ENDOVENOUS ABLATION SAPHENOUS VEIN W/ LASER Right 05/01/2018   endovenous laser ablation right greater saphenous vein  by [name] MD   . LEFT AND RIGHT HEART CATHETERIZATION WITH CORONARY ANGIOGRAM N/A 08/17/2014   Procedure: LEFT AND RIGHT HEART CATHETERIZATION WITH CORONARY ANGIOGRAM;  Surgeon: Lesleigh Noe, MD;  Location: [facility] CATH LAB;  Service: Cardiovascular;  Laterality: N/A;  . NO PAST SURGERIES    . PERCUTANEOUS CORONARY STENT INTERVENTION (PCI-S)  08/17/2014   Procedure: PERCUTANEOUS CORONARY STENT INTERVENTION (PCI-S);  Surgeon: Lesleigh Noe, MD;  Location: [facility] CATH LAB;  Service: Cardiovascular;;  . TEE WITHOUT CARDIOVERSION N/A 08/19/2014   Procedure: TRANSESOPHAGEAL ECHOCARDIOGRAM (TEE);  Surgeon: Laurey Morale, MD;  Location: [facility] ENDOSCOPY;  Service: Cardiovascular;  Laterality: N/A;     Current Outpatient Medications  Medication Sig Dispense Refill  . acetaminophen (TYLENOL) 325 MG tablet Take 2 tablets (650 mg total) by mouth every 4 (four) hours as needed for headache or mild pain.    Marland Kitchen amLODipine (NORVASC) 10 MG tablet Take 1 tablet (10 mg total) by mouth daily. 90 tablet 3  . augmented betamethasone dipropionate (DIPROLENE-AF) 0.05 % cream Apply topically 2 (two) times daily. 50 g 2  . fluticasone furoate-vilanterol (BREO ELLIPTA) 100-25 MCG/INH AEPB Inhale 1 puff into the lungs daily. 1 each 3  . lisinopril (PRINIVIL,ZESTRIL) 40 MG tablet Take 1 tablet (40 mg total) by mouth daily. 90 tablet 3  . metoprolol succinate (TOPROL XL) 100 MG 24 hr tablet Take 1 tablet (100 mg total)  by mouth daily. Take with or immediately following a meal. 90 tablet 1  . warfarin (COUMADIN) 5 MG tablet TAKE 1 TABLET DAILY. TAKE EXTRA 1/2 TABLET ON MONDAY, WEDNESDAY, FRIDAY 50 tablet 0  . busPIRone (BUSPAR) 7.5 MG tablet Take 1 tablet (7.5 mg total) by mouth 3 (three) times daily as needed. (Patient not taking: Reported on 07/30/2019) 90 tablet 1   No current facility-administered medications for this visit.    Allergies:   Shellfish allergy    Social History:  The patient  reports that he has been  smoking cigarettes. He has a 22.00 pack-year smoking history. He has never used smokeless tobacco. He reports current alcohol use. He reports that he does not use drugs.   Family History:  The patient's family history includes Aneurysm in his father; COPD in his father; Heart disease in his mother.    ROS:  Please see the history of present illness.   Otherwise, review of systems are positive for none.   All other systems are reviewed and negative.    PHYSICAL EXAM: VS:  BP (!) 138/98   Pulse 93   Ht 5\' 10"  (1.778 m)   Wt 186 lb (84.4 kg)   BMI 26.69 kg/m  , BMI Body mass index is 26.69 kg/m. GENERAL:  Well appearing HEENT:  Pupils equal round and reactive, fundi not visualized, oral mucosa unremarkable NECK:  No jugular venous distention, waveform within normal limits, carotid upstroke brisk and symmetric, no bruits, no thyromegaly LYMPHATICS:  No cervical, inguinal adenopathy LUNGS decreased breath sounds with scattered mild wheezes BACK:  No CVA tenderness CHEST:  Unremarkable HEART:  PMI not displaced or sustained,S1 and S2 within normal limits, no S3, no clicks, no rubs, no murmurs, irregular ABD:  Flat, positive bowel sounds normal in frequency in pitch, no bruits, no rebound, no guarding, no midline pulsatile mass, no hepatomegaly, no splenomegaly EXT:  2 plus pulses throughout, no edema, no cyanosis no clubbing SKIN:  No rashes no nodules NEURO:  Cranial nerves II through XII grossly intact, motor grossly intact throughout PSYCH:  Cognitively intact, oriented to person place and time    EKG:  EKG is ordered today. The ekg ordered today demonstrates atrial fibrillation, rate 93, axis within normal limits, intervals within normal limits, repolarization changes.   Recent Labs: 05/01/2019: ALT 29; Hemoglobin 15.0; Platelets 279; TSH 2.960 05/20/2019: BUN 7; Creatinine, Ser 0.90; Potassium 4.4; Sodium 137    Lipid Panel    Component Value Date/Time   CHOL 209 (H)  08/29/2018 1257   TRIG 160 (H) 08/29/2018 1257   HDL 61 08/29/2018 1257   CHOLHDL 3.4 08/29/2018 1257   CHOLHDL 2.4 08/14/2014 0238   VLDL 9 08/14/2014 0238   LDLCALC 116 (H) 08/29/2018 1257      Wt Readings from Last 3 Encounters:  07/30/19 186 lb (84.4 kg)  06/03/19 180 lb (81.6 kg)  05/20/19 182 lb (82.6 kg)      Other studies Reviewed: Additional studies/ records that were reviewed today include: Labs. Review of the above records demonstrates:  Please see elsewhere in the note.     ASSESSMENT AND PLAN:  ATRIAL FLUTTER:   He is back in fibrillation. Mr. Jason Spencer has a CHA2DS2 - VASc score of 2 with a risk of stroke of 2.2%.    We cannot really tell the difference.  He tolerates anticoagulation.  No change in therapy.    CAD:   He has had no anginal symptoms.  Unfortunately  he is at high risk for future cardiovascular events because of his lifestyle.  We talked about this again today.   CARDIOMYOPATHY:   I am going to check an echocardiogram.  If his ejection fraction is still low and the cost is not prohibitive he would agree to being switched to [facility].   HTN:   Slightly elevated but this will be managed in the context of treating his heart failure.   DYSLIPIDEMIA: He did not want to take statin and does not want other therapy.  He said he had muscle aches.  ETOH:   We talked to him about not drinking alcohol.   PROLONGED QT:   He has had no symptoms related to this.  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   TOBACCO:   We talked again today about the need to stop smoking.  AAA SCREENING: I am going to screen him given his smoking history and age.  COVID EDUCATION: I encouraged him to get the vaccine.  We talked about this today.  Current medicines are reviewed at length with the patient today.  The patient does not have concerns regarding medicines.  The  following changes have been made:  no change  Labs/ tests ordered today include:   Orders Placed This Encounter  Procedures  . US AORTA MEDICARE SCREENING  . EKG 12-Lead  . ECHOCARDIOGRAM COMPLETE    Disposition:   FU with me in 3 months.     Signed, Minus Breeding, MD  07/30/2019 2:21 PM    [facility]

## 2019-07-30 ENCOUNTER — Other Ambulatory Visit: Payer: Self-pay

## 2019-07-30 ENCOUNTER — Encounter: Payer: Self-pay | Admitting: Cardiology

## 2019-07-30 ENCOUNTER — Telehealth: Payer: Self-pay | Admitting: Family

## 2019-07-30 ENCOUNTER — Ambulatory Visit: Payer: Medicare HMO | Admitting: Cardiology

## 2019-07-30 VITALS — BP 138/98 | HR 93 | Ht 70.0 in | Wt 186.0 lb

## 2019-07-30 DIAGNOSIS — Z7189 Other specified counseling: Secondary | ICD-10-CM | POA: Diagnosis not present

## 2019-07-30 DIAGNOSIS — I429 Cardiomyopathy, unspecified: Secondary | ICD-10-CM | POA: Diagnosis not present

## 2019-07-30 DIAGNOSIS — R9431 Abnormal electrocardiogram [ECG] [EKG]: Secondary | ICD-10-CM | POA: Diagnosis not present

## 2019-07-30 DIAGNOSIS — F1010 Alcohol abuse, uncomplicated: Secondary | ICD-10-CM

## 2019-07-30 DIAGNOSIS — I10 Essential (primary) hypertension: Secondary | ICD-10-CM

## 2019-07-30 DIAGNOSIS — I4892 Unspecified atrial flutter: Secondary | ICD-10-CM | POA: Diagnosis not present

## 2019-07-30 DIAGNOSIS — I2510 Atherosclerotic heart disease of native coronary artery without angina pectoris: Secondary | ICD-10-CM

## 2019-07-30 DIAGNOSIS — Z720 Tobacco use: Secondary | ICD-10-CM | POA: Diagnosis not present

## 2019-07-30 NOTE — Telephone Encounter (Signed)
Pt scheduled with Christy 08/01/19 at 12:10 for INR check

## 2019-07-30 NOTE — Patient Instructions (Addendum)
Medication Instructions:  The current medical regimen is effective;  continue present plan and medications.  *If you need a refill on your cardiac medications before your next appointment, please call your pharmacy*  Testing/Procedures: Your physician has requested that you have an echocardiogram. Echocardiography is a painless test that uses sound waves to create images of your heart. It provides your doctor with information about the size and shape of your heart and how well your heart's chambers and valves are working. This procedure takes approximately one hour. There are no restrictions for this procedure.  You will be called to be scheduled for this test.  Your physician has requested that you have an abdominal aorta duplex. During this test, an ultrasound is used to evaluate the aorta. Allow 30 minutes for this exam. Do not eat after midnight the day before and avoid carbonated beverages.  This will be completed at [facility].  Follow-Up: At [facility], you and your health needs are our priority.  As part of our continuing mission to provide you with exceptional heart care, we have created designated Provider Care Teams.  These Care Teams include your primary Cardiologist (physician) and Advanced Practice Providers (APPs -  Physician Assistants and Nurse Practitioners) who all work together to provide you with the care you need, when you need it.  Your next appointment:   3 month(s)  The format for your next appointment:   In Person  Provider:   Rollene Rotunda, MD  Thank you for choosing [facility]!!

## 2019-08-01 ENCOUNTER — Encounter: Payer: Self-pay | Admitting: Family

## 2019-08-01 ENCOUNTER — Other Ambulatory Visit: Payer: Self-pay

## 2019-08-01 ENCOUNTER — Ambulatory Visit (INDEPENDENT_AMBULATORY_CARE_PROVIDER_SITE_OTHER): Payer: Medicare HMO | Admitting: Family

## 2019-08-01 VITALS — BP 144/70 | HR 82 | Temp 98.0°F | Ht 70.0 in | Wt 186.6 lb

## 2019-08-01 DIAGNOSIS — I10 Essential (primary) hypertension: Secondary | ICD-10-CM | POA: Diagnosis not present

## 2019-08-01 DIAGNOSIS — Z720 Tobacco use: Secondary | ICD-10-CM | POA: Diagnosis not present

## 2019-08-01 DIAGNOSIS — I4891 Unspecified atrial fibrillation: Secondary | ICD-10-CM

## 2019-08-01 DIAGNOSIS — E785 Hyperlipidemia, unspecified: Secondary | ICD-10-CM | POA: Diagnosis not present

## 2019-08-01 LAB — COAGUCHEK XS/INR WAIVED
INR: 1.5 — ABNORMAL HIGH (ref 0.9–1.1)
Prothrombin Time: 18.4 s

## 2019-08-01 MED ORDER — ATORVASTATIN CALCIUM 20 MG PO TABS: 20.0000 mg | ORAL_TABLET | Freq: Every day | ORAL | 3 refills | Status: DC

## 2019-08-01 NOTE — Patient Instructions (Signed)
Description   INR today is 1.5  (goal 2-3) (thick)  Take 2 tablets (10 mg ) today (08/01/19), Increase to  7.5 mg daily.

## 2019-08-01 NOTE — Telephone Encounter (Signed)
Patient has appt today 1/22 with Jannifer Rodney, FNP for INR check

## 2019-08-01 NOTE — Progress Notes (Signed)
Subjective:    Patient ID: Jason Spencer, male    DOB: [birth year], 69 y.o.   MRN: [id number]  Chief Complaint  Patient presents with  . Atrial Fibrillation    INR check   Pt presents to the office today for INR check. He is currently taking warfarin for A Fib. See anticoagulation flow sheet.  Denies any missed doses, extra doses, bleeding, changes in diet, or bruising.   He reports he is scheduled for ECHO next week.  Atrial Fibrillation Presents for follow-up visit. Symptoms include hypertension and palpitations. Symptoms are negative for dizziness and shortness of breath. The symptoms have been stable. Past medical history includes atrial fibrillation and hyperlipidemia.  Hypertension This is a chronic problem. The current episode started more than 1 year ago. The problem has been waxing and waning since onset. The problem is uncontrolled. Associated symptoms include palpitations. Pertinent negatives include no malaise/fatigue or shortness of breath.  Hyperlipidemia This is a chronic problem. The current episode started more than 1 year ago. The problem is uncontrolled. Pertinent negatives include no shortness of breath. He is currently on no antihyperlipidemic treatment. The current treatment provides mild improvement of lipids.       Review of Systems  Constitutional: Negative for malaise/fatigue.  Respiratory: Negative for shortness of breath.   Cardiovascular: Positive for palpitations.  Neurological: Negative for dizziness.  All other systems reviewed and are negative.      Objective:   Physical Exam Vitals reviewed.  Constitutional:      General: He is not in acute distress.    Appearance: He is well-developed.  HENT:     Head: Normocephalic.     Right Ear: Tympanic membrane normal.     Left Ear: Tympanic membrane normal.  Eyes:     General:        Right eye: No discharge.        Left eye: No discharge.     Pupils: Pupils are equal, round, and reactive to  light.  Neck:     Thyroid: No thyromegaly.  Cardiovascular:     Rate and Rhythm: Normal rate and regular rhythm.     Heart sounds: Normal heart sounds. No murmur.  Pulmonary:     Effort: Pulmonary effort is normal. No respiratory distress.     Breath sounds: Normal breath sounds. No wheezing.  Abdominal:     General: Bowel sounds are normal. There is no distension.     Palpations: Abdomen is soft.     Tenderness: There is no abdominal tenderness.  Musculoskeletal:        General: No tenderness. Normal range of motion.     Cervical back: Normal range of motion and neck supple.  Skin:    General: Skin is warm and dry.     Findings: No erythema or rash.  Neurological:     Mental Status: He is alert and oriented to person, place, and time.     Cranial Nerves: No cranial nerve deficit.     Deep Tendon Reflexes: Reflexes are normal and symmetric.  Psychiatric:        Mood and Affect: Mood is anxious.        Behavior: Behavior normal.        Thought Content: Thought content normal.        Judgment: Judgment normal.      BP (!) 144/70   Pulse 82   Temp 98 F (36.7 C)   Ht 5' 10"  (1.778 m)  Wt 186 lb 9.6 oz (84.6 kg)   BMI 26.77 kg/m       Assessment & Plan:  Drayke Grabel comes in today with chief complaint of Atrial Fibrillation (INR check)   Diagnosis and orders addressed:  1. Atrial fibrillation with RVR (HCC) Description   INR today is 1.5  (goal 2-3) (thick)  Take 2 tablets (10 mg ) today (08/01/19), Increase to  7.5 mg daily.        - CoaguChek XS/INR Waived - CMP14+EGFR  2. Essential hypertension, benign Restart Norvasc - CMP14+EGFR  3. Tobacco abuse Smoking cessation discussed - CMP14+EGFR  4. Hyperlipidemia, unspecified hyperlipidemia type Will start Lipitor today Low fat diet and exercise  - atorvastatin (LIPITOR) 20 MG tablet; Take 1 tablet (20 mg total) by mouth daily.  Dispense: 90 tablet; Refill: 3 - CMP14+EGFR - Lipid panel   Labs  pending Health Maintenance reviewed Diet and exercise encouraged  Follow up plan: 2 weeks   [name], FNP

## 2019-08-02 LAB — CMP14+EGFR
ALT: 33 IU/L (ref 0–44)
AST: 48 IU/L — ABNORMAL HIGH (ref 0–40)
Albumin/Globulin Ratio: 1.2 (ref 1.2–2.2)
Albumin: 4.1 g/dL (ref 3.8–4.8)
Alkaline Phosphatase: 71 IU/L (ref 39–117)
BUN/Creatinine Ratio: 8 — ABNORMAL LOW (ref 10–24)
BUN: 7 mg/dL — ABNORMAL LOW (ref 8–27)
Bilirubin Total: 0.7 mg/dL (ref 0.0–1.2)
CO2: 27 mmol/L (ref 20–29)
Calcium: 9.1 mg/dL (ref 8.6–10.2)
Chloride: 100 mmol/L (ref 96–106)
Creatinine, Ser: 0.93 mg/dL (ref 0.76–1.27)
GFR calc Af Amer: 99 mL/min/{1.73_m2} (ref >59)
GFR calc non Af Amer: 85 mL/min/{1.73_m2} (ref >59)
Globulin, Total: 3.5 g/dL (ref 1.5–4.5)
Glucose: 73 mg/dL (ref 65–99)
Potassium: 4.5 mmol/L (ref 3.5–5.2)
Sodium: 140 mmol/L (ref 134–144)
Total Protein: 7.6 g/dL (ref 6.0–8.5)

## 2019-08-02 LAB — LIPID PANEL
Chol/HDL Ratio: 2.3 ratio (ref 0.0–5.0)
Cholesterol, Total: 239 mg/dL — ABNORMAL HIGH (ref 100–199)
HDL: 102 mg/dL (ref >39)
LDL Chol Calc (NIH): 111 mg/dL — ABNORMAL HIGH (ref 0–99)
Triglycerides: 153 mg/dL — ABNORMAL HIGH (ref 0–149)
VLDL Cholesterol Cal: 26 mg/dL (ref 5–40)

## 2019-08-05 ENCOUNTER — Other Ambulatory Visit (HOSPITAL_COMMUNITY): Payer: Medicare HMO

## 2019-08-06 ENCOUNTER — Ambulatory Visit (HOSPITAL_COMMUNITY)
Admission: RE | Admit: 2019-08-06 | Discharge: 2019-08-06 | Disposition: A | Discharge status: Home / Self Care | Payer: Medicare HMO | Source: Ambulatory Visit | Attending: Cardiology | Admitting: Cardiology

## 2019-08-06 ENCOUNTER — Other Ambulatory Visit: Payer: Self-pay

## 2019-08-06 ENCOUNTER — Ambulatory Visit (HOSPITAL_BASED_OUTPATIENT_CLINIC_OR_DEPARTMENT_OTHER)
Admission: RE | Admit: 2019-08-06 | Discharge: 2019-08-06 | Disposition: A | Discharge status: Home / Self Care | Payer: Medicare HMO | Source: Ambulatory Visit | Attending: Cardiology | Admitting: Cardiology

## 2019-08-06 DIAGNOSIS — I4892 Unspecified atrial flutter: Secondary | ICD-10-CM | POA: Diagnosis not present

## 2019-08-06 DIAGNOSIS — I714 Abdominal aortic aneurysm, without rupture: Secondary | ICD-10-CM | POA: Insufficient documentation

## 2019-08-06 DIAGNOSIS — Z87891 Personal history of nicotine dependence: Secondary | ICD-10-CM | POA: Diagnosis not present

## 2019-08-06 DIAGNOSIS — Z720 Tobacco use: Secondary | ICD-10-CM

## 2019-08-06 DIAGNOSIS — I10 Essential (primary) hypertension: Secondary | ICD-10-CM

## 2019-08-06 DIAGNOSIS — Z136 Encounter for screening for cardiovascular disorders: Secondary | ICD-10-CM | POA: Insufficient documentation

## 2019-08-06 NOTE — Progress Notes (Signed)
*  PRELIMINARY RESULTS* Echocardiogram 2D Echocardiogram has been performed.  Stacey Drain 08/06/2019, 9:18 AM

## 2019-08-27 ENCOUNTER — Telehealth: Payer: Self-pay | Admitting: Family

## 2019-08-27 ENCOUNTER — Other Ambulatory Visit: Payer: Self-pay

## 2019-08-27 DIAGNOSIS — I2510 Atherosclerotic heart disease of native coronary artery without angina pectoris: Secondary | ICD-10-CM

## 2019-08-27 DIAGNOSIS — I10 Essential (primary) hypertension: Secondary | ICD-10-CM

## 2019-08-27 DIAGNOSIS — I4891 Unspecified atrial fibrillation: Secondary | ICD-10-CM

## 2019-08-27 MED ORDER — METOPROLOL SUCCINATE ER 100 MG PO TB24: 100.0000 mg | ORAL_TABLET | Freq: Every day | ORAL | 0 refills | Status: DC

## 2019-08-27 MED ORDER — WARFARIN SODIUM 5 MG PO TABS: ORAL_TABLET | ORAL | 0 refills | Status: DC

## 2019-09-04 ENCOUNTER — Encounter: Payer: Self-pay | Admitting: Family

## 2019-09-04 ENCOUNTER — Ambulatory Visit (INDEPENDENT_AMBULATORY_CARE_PROVIDER_SITE_OTHER): Payer: Medicare HMO | Admitting: Family

## 2019-09-04 ENCOUNTER — Other Ambulatory Visit: Payer: Self-pay

## 2019-09-04 VITALS — BP 151/73 | HR 63 | Temp 95.5°F | Ht 70.0 in | Wt 180.2 lb

## 2019-09-04 DIAGNOSIS — E663 Overweight: Secondary | ICD-10-CM | POA: Diagnosis not present

## 2019-09-04 DIAGNOSIS — J42 Unspecified chronic bronchitis: Secondary | ICD-10-CM | POA: Diagnosis not present

## 2019-09-04 DIAGNOSIS — I2510 Atherosclerotic heart disease of native coronary artery without angina pectoris: Secondary | ICD-10-CM

## 2019-09-04 DIAGNOSIS — I4891 Unspecified atrial fibrillation: Secondary | ICD-10-CM

## 2019-09-04 DIAGNOSIS — I10 Essential (primary) hypertension: Secondary | ICD-10-CM | POA: Diagnosis not present

## 2019-09-04 DIAGNOSIS — I5023 Acute on chronic systolic (congestive) heart failure: Secondary | ICD-10-CM | POA: Diagnosis not present

## 2019-09-04 DIAGNOSIS — F1010 Alcohol abuse, uncomplicated: Secondary | ICD-10-CM | POA: Diagnosis not present

## 2019-09-04 DIAGNOSIS — I429 Cardiomyopathy, unspecified: Secondary | ICD-10-CM | POA: Diagnosis not present

## 2019-09-04 DIAGNOSIS — F411 Generalized anxiety disorder: Secondary | ICD-10-CM

## 2019-09-04 DIAGNOSIS — Z720 Tobacco use: Secondary | ICD-10-CM

## 2019-09-04 DIAGNOSIS — I4892 Unspecified atrial flutter: Secondary | ICD-10-CM | POA: Diagnosis not present

## 2019-09-04 LAB — COAGUCHEK XS/INR WAIVED
INR: 4 — ABNORMAL HIGH (ref 0.9–1.1)
Prothrombin Time: 48.6 s

## 2019-09-04 NOTE — Progress Notes (Signed)
Subjective:    Patient ID: Jason Spencer, male    DOB: 11-20-1950, 69 y.o.   MRN: [id number]  Chief Complaint  Patient presents with  . Medical Management of Chronic Issues  . Coagulation Disorder   Pt presents to the office today for INR check. He is currently taking warfarin for A Fib. See anticoagulation flow sheet.  Denies any missed doses, extra doses, bleeding, changes in diet, or bruising.   He had his ECHO 08/06/19. He is followed by Cardiologists for Cardiomyopathy, CAD, A Fib,  and Atrial Flutter.   Reports he drinks about 6 beers a day.  Hypertension This is a chronic problem. The current episode started more than 1 year ago. The problem has been waxing and waning since onset. The problem is uncontrolled. Associated symptoms include anxiety. Pertinent negatives include no headaches, malaise/fatigue, peripheral edema or shortness of breath. Risk factors for coronary artery disease include male gender and sedentary lifestyle. The current treatment provides mild improvement. Hypertensive end-organ damage includes CAD/MI.  Nicotine Dependence Presents for follow-up visit. Symptoms include irritability. His urge triggers include company of smokers. He smokes < 1/2 a pack of cigarettes per day.  Anxiety Presents for follow-up visit. Symptoms include depressed mood, excessive worry, irritability, nervous/anxious behavior, panic and restlessness. Patient reports no shortness of breath. Symptoms occur most days. The severity of symptoms is moderate.     COPD PT does not use Breo daily. Continues to smoke 1/2 pack a day. Has wheezing.    Review of Systems  Constitutional: Positive for irritability. Negative for malaise/fatigue.  Respiratory: Negative for shortness of breath.   Neurological: Negative for headaches.  Psychiatric/Behavioral: The patient is nervous/anxious.   All other systems reviewed and are negative.      Objective:   Physical Exam Vitals reviewed.    Constitutional:      General: He is not in acute distress.    Appearance: He is well-developed.  HENT:     Head: Normocephalic.     Right Ear: Tympanic membrane normal.     Left Ear: Tympanic membrane normal.  Eyes:     General:        Right eye: No discharge.        Left eye: No discharge.     Pupils: Pupils are equal, round, and reactive to light.  Neck:     Thyroid: No thyromegaly.  Cardiovascular:     Rate and Rhythm: Tachycardia present. Rhythm irregular.     Heart sounds: Normal heart sounds. No murmur.  Pulmonary:     Effort: Pulmonary effort is normal. No respiratory distress.     Breath sounds: Wheezing and rhonchi present.  Abdominal:     General: Bowel sounds are normal. There is no distension.     Palpations: Abdomen is soft.     Tenderness: There is no abdominal tenderness.  Musculoskeletal:        General: No tenderness. Normal range of motion.     Cervical back: Normal range of motion and neck supple.  Skin:    General: Skin is warm and dry.     Findings: No erythema or rash.  Neurological:     Mental Status: He is alert and oriented to person, place, and time.     Cranial Nerves: No cranial nerve deficit.     Deep Tendon Reflexes: Reflexes are normal and symmetric.  Psychiatric:        Behavior: Behavior normal.        Thought Content:  Thought content normal.        Judgment: Judgment normal.     BP (!) 151/73   Pulse 63   Temp (!) 95.5 F (35.3 C) (Temporal)   Ht 5\' 10"  (1.778 m)   Wt 180 lb 3.2 oz (81.7 kg)   SpO2 95%   BMI 25.86 kg/m      Assessment & Plan:  Jason Spencer comes in today with chief complaint of Medical Management of Chronic Issues and Coagulation Disorder   Diagnosis and orders addressed:  1. Atrial fibrillation, new onset w/ RVR - CoaguChek XS/INR Waived Description   INR today is 4.0  (goal 2-3) (too thin)  Hold today's daose (09/04/19), then decrease to 5 mg (1 tablet) Monday, Wednesday, and Friday, then   7.5 mg  (1 1/2 tablets ) all other days.         2. Acute on chronic systolic CHF (congestive heart failure) ([place])  3. Atrial fibrillation with RVR ([place])  4. Atrial flutter, unspecified type ([place])  5. Coronary artery disease involving native coronary artery of native heart without angina pectoris  6. Cardiomyopathy, unspecified type ([place])  7. Essential hypertension, benign  8. Chronic bronchitis, unspecified chronic bronchitis type ([employer])  9. Alcohol abuse  10. Overweight (BMI 25.0-29.9)  11. Tobacco abuse  12. GAD (generalized anxiety disorder)   Labs reviewed from last month, will hold off and drawn in next 1-2 months  Health Maintenance reviewed Diet and exercise encouraged  Follow up plan: 1 week to recheck INR  [name], FNP

## 2019-09-04 NOTE — Patient Instructions (Signed)
Description   INR today is 4.0  (goal 2-3) (too thin)  Hold today's daose (09/04/19), then decrease to 5 mg (1 tablet) Monday, Wednesday, and Friday, then   7.5 mg (1 1/2 tablets ) all other days.

## 2019-09-11 ENCOUNTER — Other Ambulatory Visit: Payer: Self-pay

## 2019-09-11 ENCOUNTER — Ambulatory Visit (INDEPENDENT_AMBULATORY_CARE_PROVIDER_SITE_OTHER): Payer: Medicare HMO | Admitting: Family

## 2019-09-11 ENCOUNTER — Encounter: Payer: Self-pay | Admitting: Family

## 2019-09-11 VITALS — BP 182/79 | HR 79 | Temp 97.5°F | Ht 70.0 in | Wt 183.8 lb

## 2019-09-11 DIAGNOSIS — Z7901 Long term (current) use of anticoagulants: Secondary | ICD-10-CM | POA: Diagnosis not present

## 2019-09-11 DIAGNOSIS — I4891 Unspecified atrial fibrillation: Secondary | ICD-10-CM

## 2019-09-11 DIAGNOSIS — Z720 Tobacco use: Secondary | ICD-10-CM

## 2019-09-11 DIAGNOSIS — I10 Essential (primary) hypertension: Secondary | ICD-10-CM | POA: Diagnosis not present

## 2019-09-11 LAB — COAGUCHEK XS/INR WAIVED
INR: 2.5 — ABNORMAL HIGH (ref 0.9–1.1)
Prothrombin Time: 29.9 s

## 2019-09-11 MED ORDER — BETAMETHASONE DIPROPIONATE AUG 0.05 % EX CREA: TOPICAL_CREAM | Freq: Two times a day (BID) | CUTANEOUS | 2 refills | Status: DC

## 2019-09-11 MED ORDER — HYDROCHLOROTHIAZIDE 12.5 MG PO TABS: 12.5000 mg | ORAL_TABLET | Freq: Every day | ORAL | 3 refills | Status: DC

## 2019-09-11 NOTE — Progress Notes (Signed)
Subjective:    Patient ID: Jason Spencer, male    DOB: September 04, 1950, 69 y.o.   MRN: [id number]  Chief Complaint  Patient presents with  . Coagulation Disorder   PT presents to the office today to recheck INR. He was seen on 09/04/19 and his INR was 4.0. We decreased his dose and his INR today is 2.5 See anticoagulation flowsheet.   He states he has drastically decreased his smoking. States he has only smoked 6 cigarettes since our last visit.  Hypertension This is a chronic problem. The current episode started more than 1 year ago. The problem has been waxing and waning since onset. The problem is uncontrolled. Associated symptoms include shortness of breath. Pertinent negatives include no malaise/fatigue or peripheral edema. Risk factors for coronary artery disease include sedentary lifestyle. The current treatment provides moderate improvement. There is no history of kidney disease.      Review of Systems  Constitutional: Negative for malaise/fatigue.  Respiratory: Positive for shortness of breath.   All other systems reviewed and are negative.      Objective:   Physical Exam Vitals reviewed.  Constitutional:      General: He is not in acute distress.    Appearance: He is well-developed.  HENT:     Head: Normocephalic.  Eyes:     General:        Right eye: No discharge.        Left eye: No discharge.     Pupils: Pupils are equal, round, and reactive to light.  Neck:     Thyroid: No thyromegaly.  Cardiovascular:     Rate and Rhythm: Normal rate and regular rhythm.     Heart sounds: Normal heart sounds. No murmur.  Pulmonary:     Effort: Pulmonary effort is normal. No respiratory distress.     Breath sounds: Wheezing and rhonchi present.  Abdominal:     General: Bowel sounds are normal. There is no distension.     Palpations: Abdomen is soft.     Tenderness: There is no abdominal tenderness.  Musculoskeletal:        General: No tenderness. Normal range of motion.       Cervical back: Normal range of motion and neck supple.  Skin:    General: Skin is warm and dry.     Findings: No erythema or rash.  Neurological:     Mental Status: He is alert and oriented to person, place, and time.     Cranial Nerves: No cranial nerve deficit.     Deep Tendon Reflexes: Reflexes are normal and symmetric.  Psychiatric:        Behavior: Behavior normal.        Thought Content: Thought content normal.        Judgment: Judgment normal.     BP (!) 182/79   Pulse 79   Temp (!) 97.5 F (36.4 C) (Temporal)   Ht 5\' 10"  (1.778 m)   Wt 183 lb 12.8 oz (83.4 kg)   SpO2 97%   BMI 26.37 kg/m        Assessment & Plan:  Jason Spencer comes in today with chief complaint of Coagulation Disorder   Diagnosis and orders addressed:  1. Atrial fibrillation with RVR (HCC) Description   INR today is 2.5 (goal 2-3) (At goal!!)  Continue 7.5 mg (1 12 tablets) Monday, Wednesday, and Friday, then 5 mg (1 tablet) all other days.       2. Current use of  anticoagulant therapy  3. Tobacco abuse Continue to decrease  4. Essential hypertension, benign Will start HCTZ 12.5 mg today RTO in 4 weeks  - hydrochlorothiazide (HYDRODIURIL) 12.5 MG tablet; Take 1 tablet (12.5 mg total) by mouth daily.  Dispense: 90 tablet; Refill: [place], FNP

## 2019-09-11 NOTE — Addendum Note (Signed)
Addended by: Prescott Gum on: 09/11/2019 01:03 PM   Modules accepted: Orders

## 2019-10-09 ENCOUNTER — Telehealth: Payer: Self-pay | Admitting: Family

## 2019-10-09 NOTE — Chronic Care Management (AMB) (Signed)
  Chronic Care Management   Outreach Note  10/09/2019 Name: Asheton Scheffler MRN: [id number] DOB: May 03, 1951  Darek Eifler is a 69 y.o. year old male who is a primary care patient of [name], FNP. I reached out to Isabella Stalling by phone today in response to a referral sent by Mr. [employer]och Quach health plan.     An unsuccessful telephone outreach was attempted today. The patient was referred to the case management team for assistance with care management and care coordination.   Follow Up Plan: The care management team will reach out to the patient again over the next 7 days.  If patient returns call to provider office, please advise to call Embedded Care Management Care Guide Penne Lash  at [phone]  Penne Lash, RMA Care Guide, Embedded Care Coordination [facility]  [place], [place] [address] Direct Dial: [phone] [email or web address]

## 2019-10-10 ENCOUNTER — Other Ambulatory Visit: Payer: Self-pay | Admitting: Family

## 2019-10-10 DIAGNOSIS — I2510 Atherosclerotic heart disease of native coronary artery without angina pectoris: Secondary | ICD-10-CM

## 2019-10-10 DIAGNOSIS — I4891 Unspecified atrial fibrillation: Secondary | ICD-10-CM

## 2019-10-15 ENCOUNTER — Encounter: Payer: Self-pay | Admitting: Family

## 2019-10-15 ENCOUNTER — Ambulatory Visit (INDEPENDENT_AMBULATORY_CARE_PROVIDER_SITE_OTHER): Payer: Medicare HMO | Admitting: Family

## 2019-10-15 ENCOUNTER — Other Ambulatory Visit: Payer: Self-pay

## 2019-10-15 DIAGNOSIS — I4891 Unspecified atrial fibrillation: Secondary | ICD-10-CM | POA: Diagnosis not present

## 2019-10-15 LAB — COAGUCHEK XS/INR WAIVED
INR: 1.8 — ABNORMAL HIGH (ref 0.9–1.1)
Prothrombin Time: 22 s

## 2019-10-15 NOTE — Patient Instructions (Signed)
Description   INR today is 1.8 (goal 2-3) (too thick)  Take 10 mg today ( 10/15/19) then resume current dose of  7.5 mg (1 12 tablets) Monday, Wednesday, and Friday, then 5 mg (1 tablet) all other days.

## 2019-10-15 NOTE — Progress Notes (Signed)
   Subjective:    Patient ID: Jason Mcaffee, male    DOB: 01/03/1951, 69 y.o.   MRN: [id number]  Chief Complaint  Patient presents with  . Coagulation Disorder    HPI Pt presents to the office today for INR check. Today is INR is 1.8 today. He reports he did miss two doses. Denies any bleeding, bruising, or diet changes. See anticoagulation flowsheet.    Review of Systems  All other systems reviewed and are negative.      Objective:   Physical Exam Vitals reviewed.  Constitutional:      General: He is not in acute distress.    Appearance: He is well-developed.  HENT:     Head: Normocephalic.     Right Ear: Tympanic membrane normal.     Left Ear: Tympanic membrane normal.  Eyes:     General:        Right eye: No discharge.        Left eye: No discharge.     Pupils: Pupils are equal, round, and reactive to light.  Neck:     Thyroid: No thyromegaly.  Cardiovascular:     Rate and Rhythm: Normal rate and regular rhythm.     Heart sounds: Normal heart sounds. No murmur.  Pulmonary:     Effort: Pulmonary effort is normal. No respiratory distress.     Breath sounds: Normal breath sounds. No wheezing.  Abdominal:     General: Bowel sounds are normal. There is no distension.     Palpations: Abdomen is soft.     Tenderness: There is no abdominal tenderness.  Musculoskeletal:        General: No tenderness. Normal range of motion.     Cervical back: Normal range of motion and neck supple.  Skin:    General: Skin is warm and dry.     Findings: No erythema or rash.  Neurological:     Mental Status: He is alert and oriented to person, place, and time.     Cranial Nerves: No cranial nerve deficit.     Deep Tendon Reflexes: Reflexes are normal and symmetric.  Psychiatric:        Behavior: Behavior normal.        Thought Content: Thought content normal.        Judgment: Judgment normal.     BP 137/86   Pulse 86   Temp 98 F (36.7 C) (Temporal)   Ht 5\' 10"  (1.778 m)    Wt 187 lb 3.2 oz (84.9 kg)   SpO2 93%   BMI 26.86 kg/m      Assessment & Plan:  Tayshawn Purnell comes in today with chief complaint of Coagulation Disorder   Diagnosis and orders addressed:  1. Atrial fibrillation, new onset w/ RVR - CoaguChek XS/INR Waived  2. Atrial fibrillation with RVR (HCC) Description   INR today is 1.8 (goal 2-3) (too thick)  Take 10 mg today ( 10/15/19) then resume current dose of  7.5 mg (1 12 tablets) Monday, Wednesday, and Friday, then 5 mg (1 tablet) all other days.         Thursday, FNP

## 2019-10-20 NOTE — Chronic Care Management (AMB) (Signed)
  Chronic Care Management   Outreach Note  10/20/2019 Name: [name] MRN: [id number] DOB: Mar 27, 1951  [name] is a 69 y.o. year old male who is a primary care patient of [name], FNP. I reached out to Isabella Stalling by phone today in response to a referral sent by Mr. [employer] Unrein health plan.     A second unsuccessful telephone outreach was attempted today. The patient was referred to the case management team for assistance with care management and care coordination.   Follow Up Plan: The care management team will reach out to the patient again over the next 7 days.  If patient returns call to provider office, please advise to call Embedded Care Management Care Guide Penne Lash  at [phone]  Penne Lash, RMA Care Guide, Embedded Care Coordination [facility]  [place], [place] [address] Direct Dial: [phone] [email or web address]: [email or web address]

## 2019-10-27 NOTE — Chronic Care Management (AMB) (Signed)
  Chronic Care Management   Outreach Note  10/27/2019 Name: Ralphael Southgate MRN: [id number] DOB: [birth year]  Karmine Kauer is a 69 y.o. year old male who is a primary care patient of [name], FNP. I reached out to Isabella Stalling by phone today in response to a referral sent by Mr. [employer] Ndiaye health plan.     Third unsuccessful telephone outreach was attempted today. The patient was referred to the case management team for assistance with care management and care coordination. The patient's primary care provider has been notified of our unsuccessful attempts to make or maintain contact with the patient. The care management team is pleased to engage with this patient at any time in the future should he/she be interested in assistance from the care management team.   Follow Up Plan: The care management team is available to follow up with the patient after provider conversation with the patient regarding recommendation for care management engagement and subsequent re-referral to the care management team.   Penne Lash, RMA Care Guide, Embedded Care Coordination [facility]  [place], [place] [address] Direct Dial: [phone] [email or web address]: [email or web address]

## 2019-10-28 NOTE — Progress Notes (Signed)
Cardiology Office Note   Date:  10/29/2019   ID:  Jason Spencer, DOB 11/05/1950, MRN [id number]  PCP:  [name], FNP  Cardiologist:   Rollene Rotunda, MD   Chief Complaint  Patient presents with  . Palpitations      History of Present Illness: Jason Spencer is a 69 y.o. male who presents for follow-up of cardiomyopathy, arrhythmia and coronary disease. I last saw him in 2016.  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 He had a diagnostic cardiac catheterization and was found to have high-grade right coronary artery stenosis which was treated with stenting.  However, previously he was not really thought to have an ischemic etiology as the coronary disease is out of proportion to the global myopathy.  He is also had nonadherence or noncompliance with lifestyle changes.  He underwent cardioversion.  He went back into atrial fibrillation.   His EF was much better in Jan at 50%.     He is unfortunately still smoking cigarettes and drinks at least a sixpack a night.  He says he is try to cut back on the cigarettes.  Also unfortunate is the fact that he is not taking all his medications.  I do think he is taking his beta-blocker but probably he is not taking his Norvasc, hydrochlorothiazide or ACE inhibitor.  I do see that he is taking his anticoagulation because he has fluctuating INR and for the most part has been therapeutic.  He is not noticing any palpitations, presyncope or syncope.  Has had no chest x-ray, neck or arm discomfort.  He said no weight gain or edema.   Past Medical History:  Diagnosis Date  . Acute CHF (HCC)    Jason Spencer 08/13/2014  . Atrial fibrillation with RVR (HCC) 08/13/2014   new onset/notes 08/13/2014  . CAP (community acquired pneumonia) 07/2014  . Cardiomyopathy (HCC)   . Migraine 1970     Past Surgical History:  Procedure Laterality Date  . CARDIOVERSION N/A 08/19/2014   Procedure: CARDIOVERSION;  Surgeon: Laurey Morale, MD;  Location: [facility]  ENDOSCOPY;  Service: Cardiovascular;  Laterality: N/A;  . ENDOVENOUS ABLATION SAPHENOUS VEIN W/ LASER Right 05/01/2018   endovenous laser ablation right greater saphenous vein by [name] MD   . LEFT AND RIGHT HEART CATHETERIZATION WITH CORONARY ANGIOGRAM N/A 08/17/2014   Procedure: LEFT AND RIGHT HEART CATHETERIZATION WITH CORONARY ANGIOGRAM;  Surgeon: Lesleigh Noe, MD;  Location[facility] CATH LAB;  Service: Cardiovascular;  Laterality: N/A;  . NO PAST SURGERIES    . PERCUTANEOUS CORONARY STENT INTERVENTION (PCI-S)  08/17/2014   Procedure: PERCUTANEOUS CORONARY STENT INTERVENTION (PCI-S);  Surgeon: Lesleigh Noe, MD;  Location: [facility] CATH LAB;  Service: Cardiovascular;;  . TEE WITHOUT CARDIOVERSION N/A 08/19/2014   Procedure: TRANSESOPHAGEAL ECHOCARDIOGRAM (TEE);  Surgeon: Laurey Morale, MD;  Location: [facility] ENDOSCOPY;  Service: Cardiovascular;  Laterality: N/A;     Current Outpatient Medications  Medication Sig Dispense Refill  . augmented betamethasone dipropionate (DIPROLENE-AF) 0.05 % cream Apply topically 2 (two) times daily. 50 g 2  . fluticasone furoate-vilanterol (BREO ELLIPTA) 100-25 MCG/INH AEPB Inhale 1 puff into the lungs daily. 1 each 3  . metoprolol succinate (TOPROL XL) 100 MG 24 hr tablet Take 1 tablet (100 mg total) by mouth daily. Take with or immediately following a meal. 90 tablet 3  . warfarin (COUMADIN) 5 MG tablet Take as directed 50 tablet 0  . amLODipine (NORVASC) 10 MG tablet Take  1 tablet (10 mg total) by mouth daily. (Patient not taking: Reported on 10/29/2019) 90 tablet 3  . atorvastatin (LIPITOR) 20 MG tablet Take 1 tablet (20 mg total) by mouth daily. (Patient not taking: Reported on 10/29/2019) 90 tablet 3  . hydrochlorothiazide (HYDRODIURIL) 12.5 MG tablet Take 1 tablet (12.5 mg total) by mouth daily. 90 tablet 3  . lisinopril (ZESTRIL) 40 MG tablet Take 1 tablet (40 mg total) by mouth daily. 90 tablet 3   No current facility-administered medications for this  visit.    Allergies:   Shellfish allergy   ROS:  Please see the history of present illness.   Otherwise, review of systems are positive for none.   All other systems are reviewed and negative.    PHYSICAL EXAM: VS:  BP (!) 180/104   Pulse 94   Ht 5\' 10"  (1.778 m)   Wt 185 lb (83.9 kg)   BMI 26.54 kg/m  , BMI Body mass index is 26.54 kg/m. GENERAL:  Well appearing NECK:  No jugular venous distention, waveform within normal limits, carotid upstroke brisk and symmetric, no bruits, no thyromegaly LUNGS:  Clear to auscultation bilaterally CHEST:  Unremarkable HEART:  PMI not displaced or sustained,S1 within normal limits, no S3, no S4, no clicks, no rubs, no murmurs, irregular ABD:  Flat, positive bowel sounds normal in frequency in pitch, no bruits, no rebound, no guarding, no midline pulsatile mass, no hepatomegaly, no splenomegaly EXT:  2 plus pulses throughout, no edema, no cyanosis no clubbing   EKG:  EKG is  ordered today. The ekg ordered today demonstrates atrial fibrillation, rate 94, axis within normal limits, intervals within normal limits, repolarization changes.   Recent Labs: 05/01/2019: Hemoglobin 15.0; Platelets 279; TSH 2.960 08/01/2019: ALT 33; BUN 7; Creatinine, Ser 0.93; Potassium 4.5; Sodium 140    Lipid Panel    Component Value Date/Time   CHOL 239 (H) 08/01/2019 1318   TRIG 153 (H) 08/01/2019 1318   HDL 102 08/01/2019 1318   CHOLHDL 2.3 08/01/2019 1318   CHOLHDL 2.4 08/14/2014 0238   VLDL 9 08/14/2014 0238   LDLCALC 111 (H) 08/01/2019 1318      Wt Readings from Last 3 Encounters:  10/29/19 185 lb (83.9 kg)  10/15/19 187 lb 3.2 oz (84.9 kg)  09/11/19 183 lb 12.8 oz (83.4 kg)      Other studies Reviewed: Additional studies/ records that were reviewed today include: Labs. Review of the above records demonstrates:  Please see elsewhere in the note.     ASSESSMENT AND PLAN:  ATRIAL FLUTTER:   Mr. Jason Spencer has a CHA2DS2 - VASc score of 2  with a risk of stroke of 2.2%.    We cannot really tell the difference.  He tolerates anticoagulation.  No change in therapy.   CAD:   The patient has no new sypmtoms.  No further cardiovascular testing is indicated.  We will continue with aggressive risk reduction and meds as listed.  CARDIOMYOPATHY:  EF was much improved.   I pointed out the very significant role that medications play in this and he thinks he will get more compliant with these.  Cost would be a consideration so I cannot switch to Isabella Stalling.  HTN:   Blood pressure is markedly elevated but he is not taking the medicines as prescribed.  He says he will get back on his Norvasc, hydrochlorothiazide and lisinopril and we will continue with the beta-blocker.   DYSLIPIDEMIA:    He has not been  on a statin or other therapy because of muscle aches.  ETOH:   He understands the role that alcohol might play and he is advised to stop.   PROLONGED QT:   QT is very mildly prolonged.  He has no symptoms.  No change in therapy.   TOBACCO:   He is cutting down on his cigarettes and he understands need to stop smoking completely.  AAA SCREENING:  He had no AAA recently.    COVID EDUCATION:   He has not had his vaccines.  He was hesitant but I did educate him and hopefully would comply.   Current medicines are reviewed at length with the patient today.  The patient does not have concerns regarding medicines.  The following changes have been made:  no change  Labs/ tests ordered today include:   Orders Placed This Encounter  Procedures  . EKG 12-Lead    Disposition:   FU with me in 6 months.     Signed, Minus Breeding, MD  10/29/2019 1:16 PM    [facility]

## 2019-10-29 ENCOUNTER — Other Ambulatory Visit: Payer: Self-pay

## 2019-10-29 ENCOUNTER — Encounter: Payer: Self-pay | Admitting: Cardiology

## 2019-10-29 ENCOUNTER — Ambulatory Visit: Payer: Medicare HMO | Admitting: Cardiology

## 2019-10-29 VITALS — BP 180/104 | HR 94 | Ht 70.0 in | Wt 185.0 lb

## 2019-10-29 DIAGNOSIS — E785 Hyperlipidemia, unspecified: Secondary | ICD-10-CM | POA: Diagnosis not present

## 2019-10-29 DIAGNOSIS — I4891 Unspecified atrial fibrillation: Secondary | ICD-10-CM | POA: Diagnosis not present

## 2019-10-29 DIAGNOSIS — I2510 Atherosclerotic heart disease of native coronary artery without angina pectoris: Secondary | ICD-10-CM

## 2019-10-29 DIAGNOSIS — I4892 Unspecified atrial flutter: Secondary | ICD-10-CM

## 2019-10-29 DIAGNOSIS — Z7189 Other specified counseling: Secondary | ICD-10-CM | POA: Diagnosis not present

## 2019-10-29 DIAGNOSIS — I10 Essential (primary) hypertension: Secondary | ICD-10-CM | POA: Diagnosis not present

## 2019-10-29 DIAGNOSIS — I429 Cardiomyopathy, unspecified: Secondary | ICD-10-CM

## 2019-10-29 MED ORDER — LISINOPRIL 40 MG PO TABS: 40.0000 mg | ORAL_TABLET | Freq: Every day | ORAL | 3 refills | Status: DC

## 2019-10-29 MED ORDER — HYDROCHLOROTHIAZIDE 12.5 MG PO TABS: 12.5000 mg | ORAL_TABLET | Freq: Every day | ORAL | 3 refills | Status: DC

## 2019-10-29 MED ORDER — METOPROLOL SUCCINATE ER 100 MG PO TB24: 100.0000 mg | ORAL_TABLET | Freq: Every day | ORAL | 3 refills | Status: DC

## 2019-10-29 NOTE — Patient Instructions (Signed)
Medication Instructions:  Please take you medications as listed!  *If you need a refill on your cardiac medications before your next appointment, please call your pharmacy*  Follow-Up: At [facility], you and your health needs are our priority.  As part of our continuing mission to provide you with exceptional heart care, we have created designated Provider Care Teams.  These Care Teams include your primary Cardiologist (physician) and Advanced Practice Providers (APPs -  Physician Assistants and Nurse Practitioners) who all work together to provide you with the care you need, when you need it.  We recommend signing up for the patient portal called "MyChart".  Sign up information is provided on this After Visit Summary.  MyChart is used to connect with patients for Virtual Visits (Telemedicine).  Patients are able to view lab/test results, encounter notes, upcoming appointments, etc.  Non-urgent messages can be sent to your provider as well.   To learn more about what you can do with MyChart, go to [email or web address].    Your next appointment:   6 month(s)  The format for your next appointment:   In Person  Provider:   Rollene Rotunda, MD   Thank you for choosing [facility]!!

## 2019-11-04 ENCOUNTER — Other Ambulatory Visit: Payer: Self-pay

## 2019-11-04 ENCOUNTER — Encounter: Payer: Self-pay | Admitting: Family

## 2019-11-04 ENCOUNTER — Ambulatory Visit (INDEPENDENT_AMBULATORY_CARE_PROVIDER_SITE_OTHER): Payer: Medicare HMO | Admitting: Family

## 2019-11-04 VITALS — BP 109/63 | HR 76 | Temp 97.0°F | Ht 70.0 in | Wt 185.6 lb

## 2019-11-04 DIAGNOSIS — F17200 Nicotine dependence, unspecified, uncomplicated: Secondary | ICD-10-CM

## 2019-11-04 DIAGNOSIS — I4891 Unspecified atrial fibrillation: Secondary | ICD-10-CM

## 2019-11-04 LAB — COAGUCHEK XS/INR WAIVED
INR: 2 — ABNORMAL HIGH (ref 0.9–1.1)
Prothrombin Time: 24.1 s

## 2019-11-04 NOTE — Progress Notes (Signed)
   Subjective:    Patient ID: Jason Spencer, male    DOB: 01/11/1951, 69 y.o.   MRN: [id number]  Chief Complaint  Patient presents with  . Coagulation Disorder    HPI Pt presents to the office today for INR check. Today is INR is 2.0 today.  Denies any bleeding, bruising, or diet changes. See anticoagulation flowsheet. He currently takes warfarin for A Fib. This is stable.    Review of Systems  All other systems reviewed and are negative.      Objective:   Physical Exam Vitals reviewed.  Constitutional:      General: He is not in acute distress.    Appearance: He is well-developed.  HENT:     Head: Normocephalic.  Eyes:     General:        Right eye: No discharge.        Left eye: No discharge.     Pupils: Pupils are equal, round, and reactive to light.  Neck:     Thyroid: No thyromegaly.  Cardiovascular:     Rate and Rhythm: Normal rate and regular rhythm.     Heart sounds: Normal heart sounds. No murmur.  Pulmonary:     Effort: Pulmonary effort is normal. No respiratory distress.     Breath sounds: Rhonchi present. No wheezing.  Abdominal:     General: Bowel sounds are normal. There is no distension.     Palpations: Abdomen is soft.     Tenderness: There is no abdominal tenderness.  Musculoskeletal:        General: No tenderness. Normal range of motion.     Cervical back: Normal range of motion and neck supple.  Skin:    General: Skin is warm and dry.     Findings: No erythema or rash.  Neurological:     Mental Status: He is alert and oriented to person, place, and time.     Cranial Nerves: No cranial nerve deficit.     Deep Tendon Reflexes: Reflexes are normal and symmetric.  Psychiatric:        Behavior: Behavior normal.        Thought Content: Thought content normal.        Judgment: Judgment normal.       BP 109/63   Pulse 76   Temp (!) 97 F (36.1 C) (Temporal)   Ht 5\' 10"  (1.778 m)   Wt 185 lb 9.6 oz (84.2 kg)   SpO2 94%   BMI 26.63 kg/m        Assessment & Plan:  Emil Weigold comes in today with chief complaint of Coagulation Disorder   Diagnosis and orders addressed:  1. Atrial fibrillation, new onset w/ RVR Description   INR today is 2.0 (goal 2-3) (perfect)  Continue current dose of  7.5 mg (1 12 tablets) Monday, Wednesday, and Friday, then 5 mg (1 tablet) all other days.      - CoaguChek XS/INR Waived RTO in 4 weeks  2. Atrial fibrillation with RVR (HCC)  3. Current smoker Smoking cessation   Friday, FNP

## 2019-11-04 NOTE — Patient Instructions (Signed)
Description   INR today is 2.0 (goal 2-3) (perfect)  Continue current dose of  7.5 mg (1 12 tablets) Monday, Wednesday, and Friday, then 5 mg (1 tablet) all other days.

## 2019-11-11 ENCOUNTER — Ambulatory Visit: Payer: Medicare HMO

## 2019-11-22 ENCOUNTER — Other Ambulatory Visit: Payer: Self-pay | Admitting: Family

## 2019-11-22 DIAGNOSIS — I2510 Atherosclerotic heart disease of native coronary artery without angina pectoris: Secondary | ICD-10-CM

## 2019-11-22 DIAGNOSIS — I4891 Unspecified atrial fibrillation: Secondary | ICD-10-CM

## 2019-11-27 ENCOUNTER — Telehealth: Payer: Self-pay | Admitting: Family

## 2019-11-28 ENCOUNTER — Telehealth: Payer: Self-pay | Admitting: Family

## 2019-11-28 ENCOUNTER — Other Ambulatory Visit: Payer: Self-pay | Admitting: Family

## 2019-11-28 ENCOUNTER — Other Ambulatory Visit: Payer: Self-pay | Admitting: *Deleted

## 2019-11-28 DIAGNOSIS — I10 Essential (primary) hypertension: Secondary | ICD-10-CM

## 2019-11-28 DIAGNOSIS — I2510 Atherosclerotic heart disease of native coronary artery without angina pectoris: Secondary | ICD-10-CM

## 2019-11-28 DIAGNOSIS — I4891 Unspecified atrial fibrillation: Secondary | ICD-10-CM

## 2019-11-28 MED ORDER — AMLODIPINE BESYLATE 10 MG PO TABS: 10.0000 mg | ORAL_TABLET | Freq: Every day | ORAL | 3 refills | Status: DC

## 2019-11-28 MED ORDER — WARFARIN SODIUM 5 MG PO TABS: 5.0000 mg | ORAL_TABLET | ORAL | 0 refills | Status: DC

## 2019-11-28 NOTE — Progress Notes (Signed)
Prescription sent to pharmacy.

## 2019-11-28 NOTE — Telephone Encounter (Signed)
Prescription sent to pharmacy.

## 2019-11-28 NOTE — Telephone Encounter (Signed)
Scripts have refills.

## 2019-12-02 ENCOUNTER — Other Ambulatory Visit: Payer: Self-pay

## 2019-12-02 ENCOUNTER — Ambulatory Visit (INDEPENDENT_AMBULATORY_CARE_PROVIDER_SITE_OTHER): Payer: Medicare HMO | Admitting: Family

## 2019-12-02 ENCOUNTER — Encounter: Payer: Self-pay | Admitting: Family

## 2019-12-02 VITALS — BP 108/68 | HR 95 | Temp 97.5°F | Ht 70.0 in | Wt 180.0 lb

## 2019-12-02 DIAGNOSIS — I4891 Unspecified atrial fibrillation: Secondary | ICD-10-CM

## 2019-12-02 DIAGNOSIS — I2510 Atherosclerotic heart disease of native coronary artery without angina pectoris: Secondary | ICD-10-CM | POA: Diagnosis not present

## 2019-12-02 DIAGNOSIS — Z7901 Long term (current) use of anticoagulants: Secondary | ICD-10-CM

## 2019-12-02 DIAGNOSIS — I10 Essential (primary) hypertension: Secondary | ICD-10-CM | POA: Diagnosis not present

## 2019-12-02 DIAGNOSIS — F411 Generalized anxiety disorder: Secondary | ICD-10-CM

## 2019-12-02 DIAGNOSIS — J42 Unspecified chronic bronchitis: Secondary | ICD-10-CM | POA: Diagnosis not present

## 2019-12-02 DIAGNOSIS — F1010 Alcohol abuse, uncomplicated: Secondary | ICD-10-CM | POA: Diagnosis not present

## 2019-12-02 DIAGNOSIS — E785 Hyperlipidemia, unspecified: Secondary | ICD-10-CM | POA: Diagnosis not present

## 2019-12-02 DIAGNOSIS — I5023 Acute on chronic systolic (congestive) heart failure: Secondary | ICD-10-CM

## 2019-12-02 LAB — COAGUCHEK XS/INR WAIVED
INR: 1.6 — ABNORMAL HIGH (ref 0.9–1.1)
Prothrombin Time: 19.5 s

## 2019-12-02 MED ORDER — AMLODIPINE BESYLATE 5 MG PO TABS: 5.0000 mg | ORAL_TABLET | Freq: Every day | ORAL | 3 refills | Status: DC

## 2019-12-02 NOTE — Progress Notes (Signed)
Subjective:    Patient ID: Jason Spencer, male    DOB: 18-Jan-1951, 69 y.o.   MRN: [id number]  Chief Complaint  Patient presents with  . recheck protime   Pt presents to the office today for INR check. He is currently taking warfarin for A Fib. See anticoagulation flow sheet. Denies any missed doses, extra doses, bleeding, changes in diet, or bruising.   He had his ECHO 08/06/19. He is followed by Cardiologists for Cardiomyopathy, CAD, A Fib,  and Atrial Flutter.   Reports he drinks about 4-6 beers a day.  Hypertension This is a chronic problem. The current episode started more than 1 year ago. The problem has been resolved since onset. The problem is controlled. Associated symptoms include anxiety, malaise/fatigue and shortness of breath. Pertinent negatives include no peripheral edema. Risk factors for coronary artery disease include dyslipidemia, obesity, male gender, sedentary lifestyle and smoking/tobacco exposure. The current treatment provides moderate improvement. Hypertensive end-organ damage includes CAD/MI and heart failure.  Hyperlipidemia This is a chronic problem. The current episode started more than 1 year ago. The problem is controlled. Recent lipid tests were reviewed and are normal. Exacerbating diseases include obesity. Associated symptoms include shortness of breath. Current antihyperlipidemic treatment includes statins. The current treatment provides moderate improvement of lipids. Risk factors for coronary artery disease include dyslipidemia, diabetes mellitus, male sex, hypertension and a sedentary lifestyle.  Anxiety Presents for follow-up visit. Symptoms include excessive worry, irritability, nervous/anxious behavior, restlessness and shortness of breath. Symptoms occur most days.        Review of Systems  Constitutional: Positive for irritability and malaise/fatigue.  Respiratory: Positive for shortness of breath.   Psychiatric/Behavioral: The patient is  nervous/anxious.   All other systems reviewed and are negative.      Objective:   Physical Exam Vitals reviewed.  Constitutional:      General: He is not in acute distress.    Appearance: He is well-developed.  HENT:     Head: Normocephalic.     Right Ear: Tympanic membrane normal.     Left Ear: Tympanic membrane normal.  Eyes:     General:        Right eye: No discharge.        Left eye: No discharge.     Pupils: Pupils are equal, round, and reactive to light.  Neck:     Thyroid: No thyromegaly.  Cardiovascular:     Rate and Rhythm: Normal rate and regular rhythm.     Heart sounds: Normal heart sounds. No murmur.  Pulmonary:     Effort: Pulmonary effort is normal. No respiratory distress.     Breath sounds: Wheezing present.  Abdominal:     General: Bowel sounds are normal. There is no distension.     Palpations: Abdomen is soft.     Tenderness: There is no abdominal tenderness.  Musculoskeletal:        General: No tenderness. Normal range of motion.     Cervical back: Normal range of motion and neck supple.  Skin:    General: Skin is warm and dry.     Findings: No erythema or rash.  Neurological:     Mental Status: He is alert and oriented to person, place, and time.     Cranial Nerves: No cranial nerve deficit.     Deep Tendon Reflexes: Reflexes are normal and symmetric.  Psychiatric:        Behavior: Behavior normal.        Thought  Content: Thought content normal.        Judgment: Judgment normal.       BP 108/68   Pulse 95   Temp (!) 97.5 F (36.4 C) (Temporal)   Ht 5' 10"  (1.778 m)   Wt 180 lb (81.6 kg)   SpO2 97%   BMI 25.83 kg/m      Assessment & Plan:  Jason Spencer comes in today with chief complaint of recheck protime   Diagnosis and orders addressed:  1. Atrial fibrillation, new onset w/ RVR Description   INR today is 1.6 (goal 2-3) (too thin)  Take 10 mg today, then Continue current dose of  7.5 mg (1 12 tablets) Monday, Wednesday,  Friday and Saturday, then 5 mg (1 tablet) all other days.       - CoaguChek XS/INR Waived - CMP14+EGFR - CBC with Differential/Platelet  2. Acute on chronic systolic CHF (congestive heart failure) (HCC) - CMP14+EGFR - CBC with Differential/Platelet  3. Atrial fibrillation with RVR (HCC) - CMP14+EGFR - CBC with Differential/Platelet  4. Coronary artery disease involving native coronary artery of native heart without angina pectoris - amLODipine (NORVASC) 5 MG tablet; Take 1 tablet (5 mg total) by mouth daily.  Dispense: 90 tablet; Refill: 3 - CMP14+EGFR - CBC with Differential/Platelet  5. Essential hypertension, benign - amLODipine (NORVASC) 5 MG tablet; Take 1 tablet (5 mg total) by mouth daily.  Dispense: 90 tablet; Refill: 3 - CMP14+EGFR - CBC with Differential/Platelet  6. Chronic bronchitis, unspecified chronic bronchitis type ([facility]) - CMP14+EGFR - CBC with Differential/Platelet  7. Alcohol abuse - CMP14+EGFR - CBC with Differential/Platelet  8. Current use of anticoagulant therapy - CMP14+EGFR - CBC with Differential/Platelet  9. GAD (generalized anxiety disorder) - CMP14+EGFR - CBC with Differential/Platelet  10. Hyperlipidemia, unspecified hyperlipidemia type - CMP14+EGFR - CBC with Differential/Platelet   Labs pending Health Maintenance reviewed Diet and exercise encouraged  Follow up plan: 2 weeks to recheck HTN and INR  [name], FNP

## 2019-12-02 NOTE — Patient Instructions (Signed)
Description   INR today is 1.6 (goal 2-3) (too thin)  Take 10 mg today, then Continue current dose of  7.5 mg (1 12 tablets) Monday, Wednesday, Friday and Saturday, then 5 mg (1 tablet) all other days.

## 2019-12-03 LAB — CBC WITH DIFFERENTIAL/PLATELET
Basophils Absolute: 0 10*3/uL (ref 0.0–0.2)
Basos: 0 %
EOS (ABSOLUTE): 0.2 10*3/uL (ref 0.0–0.4)
Eos: 3 %
Hematocrit: 43.3 % (ref 37.5–51.0)
Hemoglobin: 15.4 g/dL (ref 13.0–17.7)
Immature Grans (Abs): 0.1 10*3/uL (ref 0.0–0.1)
Immature Granulocytes: 1 %
Lymphocytes Absolute: 1.2 10*3/uL (ref 0.7–3.1)
Lymphs: 19 %
MCH: 35.3 pg — ABNORMAL HIGH (ref 26.6–33.0)
MCHC: 35.6 g/dL (ref 31.5–35.7)
MCV: 99 fL — ABNORMAL HIGH (ref 79–97)
Monocytes Absolute: 0.8 10*3/uL (ref 0.1–0.9)
Monocytes: 12 %
Neutrophils Absolute: 4.3 10*3/uL (ref 1.4–7.0)
Neutrophils: 65 %
Platelets: 239 10*3/uL (ref 150–450)
RBC: 4.36 x10E6/uL (ref 4.14–5.80)
RDW: 11.3 % — ABNORMAL LOW (ref 11.6–15.4)
WBC: 6.6 10*3/uL (ref 3.4–10.8)

## 2019-12-03 LAB — CMP14+EGFR
ALT: 25 IU/L (ref 0–44)
AST: 35 IU/L (ref 0–40)
Albumin/Globulin Ratio: 1.1 — ABNORMAL LOW (ref 1.2–2.2)
Albumin: 4.2 g/dL (ref 3.8–4.8)
Alkaline Phosphatase: 62 IU/L (ref 48–121)
BUN/Creatinine Ratio: 12 (ref 10–24)
BUN: 17 mg/dL (ref 8–27)
Bilirubin Total: 0.7 mg/dL (ref 0.0–1.2)
CO2: 23 mmol/L (ref 20–29)
Calcium: 9.8 mg/dL (ref 8.6–10.2)
Chloride: 93 mmol/L — ABNORMAL LOW (ref 96–106)
Creatinine, Ser: 1.47 mg/dL — ABNORMAL HIGH (ref 0.76–1.27)
GFR calc Af Amer: 57 mL/min/{1.73_m2} — ABNORMAL LOW (ref >59)
GFR calc non Af Amer: 49 mL/min/{1.73_m2} — ABNORMAL LOW (ref >59)
Globulin, Total: 3.9 g/dL (ref 1.5–4.5)
Glucose: 112 mg/dL — ABNORMAL HIGH (ref 65–99)
Potassium: 4.8 mmol/L (ref 3.5–5.2)
Sodium: 132 mmol/L — ABNORMAL LOW (ref 134–144)
Total Protein: 8.1 g/dL (ref 6.0–8.5)

## 2019-12-16 ENCOUNTER — Encounter: Payer: Self-pay | Admitting: Family

## 2019-12-16 ENCOUNTER — Ambulatory Visit (INDEPENDENT_AMBULATORY_CARE_PROVIDER_SITE_OTHER): Payer: Medicare HMO | Admitting: Family

## 2019-12-16 ENCOUNTER — Other Ambulatory Visit: Payer: Self-pay

## 2019-12-16 VITALS — BP 119/67 | HR 77 | Temp 96.9°F | Ht 70.0 in | Wt 182.6 lb

## 2019-12-16 DIAGNOSIS — Z7901 Long term (current) use of anticoagulants: Secondary | ICD-10-CM | POA: Diagnosis not present

## 2019-12-16 DIAGNOSIS — I4891 Unspecified atrial fibrillation: Secondary | ICD-10-CM

## 2019-12-16 LAB — COAGUCHEK XS/INR WAIVED
INR: 1.9 — ABNORMAL HIGH (ref 0.9–1.1)
Prothrombin Time: 22.8 s

## 2019-12-16 NOTE — Progress Notes (Signed)
   Subjective:    Patient ID: Jason Spencer, male    DOB: Feb 13, 1951, 69 y.o.   MRN: [id number]  Chief Complaint  Patient presents with  . Coagulation Disorder  . Hypertension    HPI Pt presents to the office today for INR check. He is currently taking warfarin for A Fib. See anticoagulation flow sheet. Denies any missed doses, extra doses, bleeding, changes in diet, or bruising.    Review of Systems  All other systems reviewed and are negative.      Objective:   Physical Exam Vitals reviewed.  Constitutional:      General: He is not in acute distress.    Appearance: He is well-developed.  HENT:     Head: Normocephalic.  Eyes:     General:        Right eye: No discharge.        Left eye: No discharge.     Pupils: Pupils are equal, round, and reactive to light.  Neck:     Thyroid: No thyromegaly.  Cardiovascular:     Rate and Rhythm: Normal rate and regular rhythm.     Heart sounds: Normal heart sounds. No murmur.  Pulmonary:     Effort: Pulmonary effort is normal. No respiratory distress.     Breath sounds: Normal breath sounds. No wheezing.  Abdominal:     General: Bowel sounds are normal. There is no distension.     Palpations: Abdomen is soft.     Tenderness: There is no abdominal tenderness.  Musculoskeletal:        General: No tenderness. Normal range of motion.     Cervical back: Normal range of motion and neck supple.  Skin:    General: Skin is warm and dry.     Findings: No erythema or rash.  Neurological:     Mental Status: He is alert and oriented to person, place, and time.     Cranial Nerves: No cranial nerve deficit.     Deep Tendon Reflexes: Reflexes are normal and symmetric.  Psychiatric:        Behavior: Behavior normal.        Thought Content: Thought content normal.        Judgment: Judgment normal.       BP 119/67   Pulse 77   Temp (!) 96.9 F (36.1 C) (Temporal)   Ht 5\' 10"  (1.778 m)   Wt 182 lb 9.6 oz (82.8 kg)   SpO2 96%    BMI 26.20 kg/m      Assessment & Plan:  Jason Spencer comes in today with chief complaint of Coagulation Disorder and Hypertension   Diagnosis and orders addressed:  1. Atrial fibrillation, new onset w/ RVR - CoaguChek XS/INR Waived  2. Atrial fibrillation with RVR (HCC)  3. Current use of anticoagulant therapy   Description   INR today is 1.9 (goal 2-3) (too thin)  Take 10 mg today (12/16/19), then Increaes current dose of  7.5 mg (1 12 tablets) Monday, Wednesday, Friday and Saturday, then 5 mg (1 tablet) all other days.      RTO in 2 weeks   Monday, FNP

## 2019-12-16 NOTE — Patient Instructions (Signed)
Description   INR today is 1.9 (goal 2-3) (too thin)  Take 10 mg today (12/16/19), then Increaes current dose of  7.5 mg (1 12 tablets) Monday, Wednesday, Friday and Saturday, then 5 mg (1 tablet) all other days.

## 2019-12-29 ENCOUNTER — Other Ambulatory Visit: Payer: Self-pay

## 2019-12-29 ENCOUNTER — Encounter: Payer: Self-pay | Admitting: Family

## 2019-12-29 ENCOUNTER — Ambulatory Visit (INDEPENDENT_AMBULATORY_CARE_PROVIDER_SITE_OTHER): Payer: Medicare HMO | Admitting: Family

## 2019-12-29 VITALS — BP 88/54 | HR 81 | Temp 96.0°F | Ht 70.0 in | Wt 181.8 lb

## 2019-12-29 DIAGNOSIS — F1010 Alcohol abuse, uncomplicated: Secondary | ICD-10-CM

## 2019-12-29 DIAGNOSIS — E663 Overweight: Secondary | ICD-10-CM

## 2019-12-29 DIAGNOSIS — I959 Hypotension, unspecified: Secondary | ICD-10-CM | POA: Diagnosis not present

## 2019-12-29 DIAGNOSIS — I10 Essential (primary) hypertension: Secondary | ICD-10-CM

## 2019-12-29 DIAGNOSIS — Z7901 Long term (current) use of anticoagulants: Secondary | ICD-10-CM

## 2019-12-29 DIAGNOSIS — Z720 Tobacco use: Secondary | ICD-10-CM

## 2019-12-29 DIAGNOSIS — I4891 Unspecified atrial fibrillation: Secondary | ICD-10-CM | POA: Diagnosis not present

## 2019-12-29 LAB — COAGUCHEK XS/INR WAIVED
INR: 2.1 — ABNORMAL HIGH (ref 0.9–1.1)
Prothrombin Time: 25.6 s

## 2019-12-29 NOTE — Progress Notes (Signed)
Subjective:    Patient ID: Jason Spencer, male    DOB: 07-Sep-1950, 69 y.o.   MRN: [id number]  Chief Complaint  Patient presents with  . Coagulation Disorder   Pt presents to the office today for INR check. He is currently taking warfarin for A Fib. See anticoagulation flow sheet. Denies any extra doses, bleeding, changes in diet, or bruising.  States he missed one day of his medications.   He is an alcoholic and states he does not eat like he should. He will go without meals. His BP is low today. He is a poor historian on taking his medications.  He reports he drinks 5-6 beers a day.  Hypertension This is a chronic problem. The current episode started more than 1 year ago. The problem has been resolved since onset. The problem is controlled. Associated symptoms include malaise/fatigue. Pertinent negatives include no peripheral edema or shortness of breath. Risk factors for coronary artery disease include dyslipidemia, obesity, male gender and smoking/tobacco exposure. The current treatment provides moderate improvement.  Nicotine Dependence Presents for follow-up visit. His urge triggers include company of smokers. He smokes 1 pack of cigarettes per day. Compliance with prior treatments has been good.      Review of Systems  Constitutional: Positive for malaise/fatigue.  Respiratory: Negative for shortness of breath.   All other systems reviewed and are negative.      Objective:   Physical Exam Vitals reviewed.  Constitutional:      General: He is not in acute distress.    Appearance: He is well-developed.  HENT:     Head: Normocephalic.     Right Ear: Tympanic membrane normal.     Left Ear: Tympanic membrane normal.  Eyes:     General:        Right eye: No discharge.        Left eye: No discharge.     Pupils: Pupils are equal, round, and reactive to light.  Neck:     Thyroid: No thyromegaly.  Cardiovascular:     Rate and Rhythm: Normal rate and regular rhythm.      Heart sounds: Normal heart sounds. No murmur heard.   Pulmonary:     Effort: Pulmonary effort is normal. No respiratory distress.     Breath sounds: Decreased breath sounds and rhonchi present. No wheezing.  Abdominal:     General: Bowel sounds are normal. There is no distension.     Palpations: Abdomen is soft.     Tenderness: There is no abdominal tenderness.  Musculoskeletal:        General: No tenderness. Normal range of motion.     Cervical back: Normal range of motion and neck supple.  Skin:    General: Skin is warm and dry.     Findings: No erythema or rash.  Neurological:     Mental Status: He is alert and oriented to person, place, and time.     Cranial Nerves: No cranial nerve deficit.     Deep Tendon Reflexes: Reflexes are normal and symmetric.  Psychiatric:        Behavior: Behavior normal.        Thought Content: Thought content normal.        Judgment: Judgment normal.          BP (!) 95/59   Pulse 91   Temp (!) 96 F (35.6 C) (Temporal)   Ht 5' 10"  (1.778 m)   Wt 181 lb 12.8 oz (82.5 kg)  SpO2 96%   BMI 26.09 kg/m   Assessment & Plan:  Jason Spencer comes in today with chief complaint of Coagulation Disorder   Diagnosis and orders addressed:  1. Atrial fibrillation with RVR (HCC) - CMP14+EGFR - CBC with Differential/Platelet  2. Hypotension, unspecified hypotension type Will stop HCTZ 12.5 mg and hold all BP medications tonight - CMP14+EGFR - CBC with Differential/Platelet  3. Essential hypertension, benign - CMP14+EGFR - CBC with Differential/Platelet  4. Alcohol abuse Decrease alcohol intake - CMP14+EGFR - CBC with Differential/Platelet  5. Current use of anticoagulant therapy - CMP14+EGFR - CBC with Differential/Platelet  6. Overweight (BMI 25.0-29.9) - CMP14+EGFR - CBC with Differential/Platelet  7. Tobacco abus - CMP14+EGFR - CBC with Differential/Platelet   Labs pending Health Maintenance reviewed Diet and exercise  encouraged  Follow up plan: 1 week to recheck hypotension   [name], FNP

## 2019-12-29 NOTE — Patient Instructions (Signed)
Hypotension As your heart beats, it forces blood through your body. Hypotension, commonly called low blood pressure, is when the force of blood pumping through your arteries is too weak. Arteries are blood vessels that carry blood from the heart throughout the body. Depending on the cause and severity, hypotension may be harmless (benign) or may cause serious problems (be critical). When blood pressure is too low, you may not get enough blood to your brain or to the rest of your organs. This can cause weakness, light-headedness, rapid heartbeat, and fainting. What are the causes? This condition may be caused by:  Blood loss.  Loss of body fluids (dehydration).  Heart problems.  Hormone (endocrine) problems.  Pregnancy.  Severe infection.  Lack of certain nutrients.  Severe allergic reactions (anaphylaxis).  Certain medicines, such as blood pressure medicine or medicines that make the body lose excess fluids (diuretics). Sometimes, hypotension may be caused by not taking medicine as directed, such as taking too much of a certain medicine. What increases the risk? The following factors may make you more likely to develop this condition:  Age. Risk increases as you get older.  Conditions that affect the heart or the central nervous system.  Taking certain medicines, such as blood pressure medicine or diuretics.  Being pregnant. What are the signs or symptoms? Common symptoms of this condition include:  Weakness.  Light-headedness.  Dizziness.  Blurred vision.  Fatigue.  Rapid heartbeat.  Fainting, in severe cases. How is this diagnosed? This condition is diagnosed based on:  Your medical history.  Your symptoms.  Your blood pressure measurement. Your health care provider will check your blood pressure when you are: ? Lying down. ? Sitting. ? Standing. A blood pressure reading is recorded as two numbers, such as "120 over 80" (or 120/80). The first ("top")  number is called the systolic pressure. It is a measure of the pressure in your arteries as your heart beats. The second ("bottom") number is called the diastolic pressure. It is a measure of the pressure in your arteries when your heart relaxes between beats. Blood pressure is measured in a unit called mm Hg. Healthy blood pressure for most adults is 120/80. If your blood pressure is below 90/60, you may be diagnosed with hypotension. Other information or tests that may be used to diagnose hypotension include:  Your other vital signs, such as your heart rate and temperature.  Blood tests.  Tilt table test. For this test, you will be safely secured to a table that moves you from a lying position to an upright position. Your heart rhythm and blood pressure will be monitored during the test. How is this treated? Treatment for this condition may include:  Changing your diet. This may involve eating more salt (sodium) or drinking more water.  Taking medicines to raise your blood pressure.  Changing the dosage of certain medicines you are taking that might be lowering your blood pressure.  Wearing compression stockings. These stockings help to prevent blood clots and reduce swelling in your legs. In some cases, you may need to go to the hospital for:  Fluid replacement. This means you will receive fluids through an IV.  Blood replacement. This means you will receive donated blood through an IV (transfusion).  Treating an infection or heart problems, if this applies.  Monitoring. You may need to be monitored while medicines that you are taking wear off. Follow these instructions at home: Eating and drinking   Drink enough fluid to keep your   urine pale yellow.  Eat a healthy diet, and follow instructions from your health care provider about eating or drinking restrictions. A healthy diet includes: ? Fresh fruits and vegetables. ? Whole grains. ? Lean meats. ? Low-fat dairy  products.  Eat extra salt only as directed. Do not add extra salt to your diet unless your health care provider told you to do that.  Eat frequent, small meals.  Avoid standing up suddenly after eating. Medicines  Take over-the-counter and prescription medicines only as told by your health care provider. ? Follow instructions from your health care provider about changing the dosage of your current medicines, if this applies. ? Do not stop or adjust any of your medicines on your own. General instructions   Wear compression stockings as told by your health care provider.  Get up slowly from lying down or sitting positions. This gives your blood pressure a chance to adjust.  Avoid hot showers and excessive heat as directed by your health care provider.  Return to your normal activities as told by your health care provider. Ask your health care provider what activities are safe for you.  Do not use any products that contain nicotine or tobacco, such as cigarettes, e-cigarettes, and chewing tobacco. If you need help quitting, ask your health care provider.  Keep all follow-up visits as told by your health care provider. This is important. Contact a health care provider if you:  Vomit.  Have diarrhea.  Have a fever for more than 2-3 days.  Feel more thirsty than usual.  Feel weak and tired. Get help right away if you:  Have chest pain.  Have a fast or irregular heartbeat.  Develop numbness in any part of your body.  Cannot move your arms or your legs.  Have trouble speaking.  Become sweaty or feel light-headed.  Faint.  Feel short of breath.  Have trouble staying awake.  Feel confused. Summary  Hypotension is when the force of blood pumping through your arteries is too weak.  Hypotension may be harmless (benign) or may cause serious problems (be critical).  Treatment for this condition may include changing your diet, changing your medicines, and wearing  compression stockings.  In some cases, you may need to go to the hospital for fluid or blood replacement. This information is not intended to replace advice given to you by your health care provider. Make sure you discuss any questions you have with your health care provider. Document Revised: 12/20/2017 Document Reviewed: 12/20/2017 Elsevier Patient Education  2020 [employer]  

## 2019-12-29 NOTE — Addendum Note (Signed)
Addended by: Prescott Gum on: 12/29/2019 11:58 AM   Modules accepted: Orders

## 2019-12-30 ENCOUNTER — Other Ambulatory Visit: Payer: Self-pay | Admitting: Family

## 2019-12-30 DIAGNOSIS — R7989 Other specified abnormal findings of blood chemistry: Secondary | ICD-10-CM

## 2019-12-30 LAB — CBC WITH DIFFERENTIAL/PLATELET
Basophils Absolute: 0 10*3/uL (ref 0.0–0.2)
Basos: 0 %
EOS (ABSOLUTE): 0.2 10*3/uL (ref 0.0–0.4)
Eos: 3 %
Hematocrit: 41.5 % (ref 37.5–51.0)
Hemoglobin: 14.5 g/dL (ref 13.0–17.7)
Immature Grans (Abs): 0.1 10*3/uL (ref 0.0–0.1)
Immature Granulocytes: 1 %
Lymphocytes Absolute: 1 10*3/uL (ref 0.7–3.1)
Lymphs: 16 %
MCH: 34.9 pg — ABNORMAL HIGH (ref 26.6–33.0)
MCHC: 34.9 g/dL (ref 31.5–35.7)
MCV: 100 fL — ABNORMAL HIGH (ref 79–97)
Monocytes Absolute: 0.7 10*3/uL (ref 0.1–0.9)
Monocytes: 11 %
Neutrophils Absolute: 4.5 10*3/uL (ref 1.4–7.0)
Neutrophils: 69 %
Platelets: 231 10*3/uL (ref 150–450)
RBC: 4.15 x10E6/uL (ref 4.14–5.80)
RDW: 11.4 % — ABNORMAL LOW (ref 11.6–15.4)
WBC: 6.5 10*3/uL (ref 3.4–10.8)

## 2019-12-30 LAB — CMP14+EGFR
ALT: 27 IU/L (ref 0–44)
AST: 42 IU/L — ABNORMAL HIGH (ref 0–40)
Albumin/Globulin Ratio: 1.1 — ABNORMAL LOW (ref 1.2–2.2)
Albumin: 4.2 g/dL (ref 3.8–4.8)
Alkaline Phosphatase: 59 IU/L (ref 48–121)
BUN/Creatinine Ratio: 10 (ref 10–24)
BUN: 15 mg/dL (ref 8–27)
Bilirubin Total: 0.7 mg/dL (ref 0.0–1.2)
CO2: 25 mmol/L (ref 20–29)
Calcium: 9.9 mg/dL (ref 8.6–10.2)
Chloride: 93 mmol/L — ABNORMAL LOW (ref 96–106)
Creatinine, Ser: 1.46 mg/dL — ABNORMAL HIGH (ref 0.76–1.27)
GFR calc Af Amer: 57 mL/min/{1.73_m2} — ABNORMAL LOW (ref >59)
GFR calc non Af Amer: 49 mL/min/{1.73_m2} — ABNORMAL LOW (ref >59)
Globulin, Total: 3.7 g/dL (ref 1.5–4.5)
Glucose: 110 mg/dL — ABNORMAL HIGH (ref 65–99)
Potassium: 4.7 mmol/L (ref 3.5–5.2)
Sodium: 132 mmol/L — ABNORMAL LOW (ref 134–144)
Total Protein: 7.9 g/dL (ref 6.0–8.5)

## 2020-01-06 ENCOUNTER — Telehealth: Payer: Self-pay | Admitting: Family

## 2020-01-08 ENCOUNTER — Ambulatory Visit: Payer: Medicare HMO | Admitting: Family

## 2020-01-29 ENCOUNTER — Encounter: Payer: Self-pay | Admitting: Family

## 2020-01-29 ENCOUNTER — Other Ambulatory Visit: Payer: Self-pay

## 2020-01-29 ENCOUNTER — Ambulatory Visit (INDEPENDENT_AMBULATORY_CARE_PROVIDER_SITE_OTHER): Payer: Medicare HMO | Admitting: Family

## 2020-01-29 VITALS — BP 108/57 | HR 60 | Temp 96.4°F | Ht 70.0 in | Wt 185.2 lb

## 2020-01-29 DIAGNOSIS — Z720 Tobacco use: Secondary | ICD-10-CM | POA: Diagnosis not present

## 2020-01-29 DIAGNOSIS — I4891 Unspecified atrial fibrillation: Secondary | ICD-10-CM

## 2020-01-29 LAB — COAGUCHEK XS/INR WAIVED
INR: 2.1 — ABNORMAL HIGH (ref 0.9–1.1)
Prothrombin Time: 24.9 s

## 2020-01-29 NOTE — Progress Notes (Signed)
   Subjective:    Patient ID: Jason Spencer, male    DOB: Apr 11, 1951, 69 y.o.   MRN: [id number]  Chief Complaint  Patient presents with  . Coagulation Disorder    HPI  Pt presents to the office today for INR check. He is currently taking warfarin for A Fib. See anticoagulation flow sheet. Denies any extra doses, bleeding, changes in diet, or bruising.   Review of Systems  All other systems reviewed and are negative.      Objective:   Physical Exam Vitals reviewed.  Constitutional:      General: He is not in acute distress.    Appearance: He is well-developed.  HENT:     Head: Normocephalic.  Eyes:     General:        Right eye: No discharge.        Left eye: No discharge.     Pupils: Pupils are equal, round, and reactive to light.  Neck:     Thyroid: No thyromegaly.  Cardiovascular:     Rate and Rhythm: Normal rate and regular rhythm.     Heart sounds: Normal heart sounds. No murmur heard.   Pulmonary:     Effort: Pulmonary effort is normal. No respiratory distress.     Breath sounds: Wheezing present.  Abdominal:     General: Bowel sounds are normal. There is no distension.     Palpations: Abdomen is soft.     Tenderness: There is no abdominal tenderness.  Musculoskeletal:        General: No tenderness. Normal range of motion.     Cervical back: Normal range of motion and neck supple.  Skin:    General: Skin is warm and dry.     Findings: No erythema or rash.  Neurological:     Mental Status: He is alert and oriented to person, place, and time.     Cranial Nerves: No cranial nerve deficit.     Deep Tendon Reflexes: Reflexes are normal and symmetric.  Psychiatric:        Behavior: Behavior normal.        Thought Content: Thought content normal.        Judgment: Judgment normal.       BP (!) 108/57   Pulse 60   Temp (!) 96.4 F (35.8 C) (Temporal)   Ht 5\' 10"  (1.778 m)   Wt 185 lb 3.2 oz (84 kg)   BMI 26.57 kg/m      Assessment & Plan:    Jason Spencer comes in today with chief complaint of Coagulation Disorder   Diagnosis and orders addressed:  1. Atrial fibrillation, new onset w/ RVR Description   INR today is 2.1 (goal 2-3) (perfect!)  Continue current dose of  7.5 mg (1 12 tablets) Monday, Wednesday, Friday and Saturday, then 5 mg (1 tablet) all other days.      - CoaguChek XS/INR Waived  2. Atrial fibrillation with RVR (HCC)  3. Tobacco abuse Smoking cessation discussed   Health Maintenance reviewed Diet and exercise encouraged  Follow up plan: 1 month    Saturday, FNP

## 2020-01-29 NOTE — Patient Instructions (Signed)
Description   INR today is 2.1 (goal 2-3) (perfect!)  Continue current dose of  7.5 mg (1 12 tablets) Monday, Wednesday, Friday and Saturday, then 5 mg (1 tablet) all other days.

## 2020-02-21 ENCOUNTER — Other Ambulatory Visit: Payer: Self-pay | Admitting: Family

## 2020-02-21 DIAGNOSIS — I2510 Atherosclerotic heart disease of native coronary artery without angina pectoris: Secondary | ICD-10-CM

## 2020-02-21 DIAGNOSIS — I4891 Unspecified atrial fibrillation: Secondary | ICD-10-CM

## 2020-03-03 ENCOUNTER — Encounter: Payer: Self-pay | Admitting: Family

## 2020-03-03 ENCOUNTER — Other Ambulatory Visit: Payer: Self-pay

## 2020-03-03 ENCOUNTER — Ambulatory Visit (INDEPENDENT_AMBULATORY_CARE_PROVIDER_SITE_OTHER): Payer: Medicare HMO | Admitting: Family

## 2020-03-03 VITALS — BP 119/58 | HR 75 | Temp 96.3°F | Ht 70.0 in | Wt 177.8 lb

## 2020-03-03 DIAGNOSIS — J449 Chronic obstructive pulmonary disease, unspecified: Secondary | ICD-10-CM | POA: Diagnosis not present

## 2020-03-03 DIAGNOSIS — I4891 Unspecified atrial fibrillation: Secondary | ICD-10-CM

## 2020-03-03 DIAGNOSIS — J441 Chronic obstructive pulmonary disease with (acute) exacerbation: Secondary | ICD-10-CM

## 2020-03-03 DIAGNOSIS — Z720 Tobacco use: Secondary | ICD-10-CM | POA: Diagnosis not present

## 2020-03-03 LAB — COAGUCHEK XS/INR WAIVED
INR: 3.2 — ABNORMAL HIGH (ref 0.9–1.1)
Prothrombin Time: 38.5 s

## 2020-03-03 MED ORDER — TRELEGY ELLIPTA 100-62.5-25 MCG/INH IN AEPB
1.0000 | INHALATION_SPRAY | Freq: Every day | RESPIRATORY_TRACT | 2 refills | Status: DC
Start: 2020-03-03 — End: 2020-05-28

## 2020-03-03 NOTE — Patient Instructions (Signed)
Description   INR today is 3.1(goal 2-3) (too thin!)  Hold today's dose (03/03/20) Then 7.5 mg (1 12 tablets) Monday, Wednesday, Friday then 5 mg (1 tablet) all other days.

## 2020-03-03 NOTE — Progress Notes (Signed)
Subjective:    Patient ID: Jason Spencer, male    DOB: 1950/09/06, 69 y.o.   MRN: [id number]  Chief Complaint  Patient presents with  . Atrial Fibrillation    takes coumadin 5mg  tab 1 and 1/2  three days a week and with then other four days takes 0ne tablet. States the days he does it is written at home    HPI  Pt presents to the office today for INR check. He is currently taking warfarin for A Fib. See anticoagulation flow sheet. He states he may have taken an extra dose this week, but is unsure. Denies any bleeding, changes in diet, or bruising.   Review of Systems  All other systems reviewed and are negative.      Objective:   Physical Exam Vitals reviewed.  Constitutional:      General: He is not in acute distress.    Appearance: He is well-developed.  HENT:     Head: Normocephalic.  Eyes:     General:        Right eye: No discharge.        Left eye: No discharge.     Pupils: Pupils are equal, round, and reactive to light.  Neck:     Thyroid: No thyromegaly.  Cardiovascular:     Rate and Rhythm: Normal rate and regular rhythm.     Heart sounds: Normal heart sounds. No murmur heard.   Pulmonary:     Effort: Pulmonary effort is normal. No respiratory distress.     Breath sounds: Wheezing and rhonchi present.  Abdominal:     General: Bowel sounds are normal. There is no distension.     Palpations: Abdomen is soft.     Tenderness: There is no abdominal tenderness.  Musculoskeletal:        General: No tenderness. Normal range of motion.     Cervical back: Normal range of motion and neck supple.  Skin:    General: Skin is warm and dry.     Findings: No erythema or rash.  Neurological:     Mental Status: He is alert and oriented to person, place, and time.     Cranial Nerves: No cranial nerve deficit.     Deep Tendon Reflexes: Reflexes are normal and symmetric.  Psychiatric:        Behavior: Behavior normal.        Thought Content: Thought content  normal.        Judgment: Judgment normal.       BP (!) 119/58   Pulse 75   Temp (!) 96.3 F (35.7 C) (Temporal)   Ht 5\' 10"  (1.778 m)   Wt 177 lb 12.8 oz (80.6 kg)   SpO2 93%   BMI 25.51 kg/m      Assessment & Plan:  Thao Vanover comes in today with chief complaint of Atrial Fibrillation (takes coumadin 5mg  tab 1 and 1/2  three days a week and with then other four days takes 0ne tablet. States the days he does it is written at home)   Diagnosis and orders addressed:  1. Atrial fibrillation, new onset w/ RVR - CoaguChek XS/INR Waived Description   INR today is 3.1(goal 2-3) (too thin!)  Hold today's dose (03/03/20) Then 7.5 mg (1 12 tablets) Monday, Wednesday, Friday then 5 mg (1 tablet) all other days.        2. Atrial fibrillation with RVR (HCC)  3. Tobacco abuse Smoking cessation   4. COPD exacerbation (HCC)  5. Chronic obstructive pulmonary disease, unspecified COPD type (HCC) Will start Trelegy today     Jannifer Rodney, FNP

## 2020-03-19 ENCOUNTER — Ambulatory Visit: Payer: Medicare HMO | Admitting: Family

## 2020-03-22 ENCOUNTER — Encounter: Payer: Self-pay | Admitting: Family

## 2020-03-29 ENCOUNTER — Ambulatory Visit: Payer: Medicare HMO | Admitting: Nurse Practitioner

## 2020-03-29 ENCOUNTER — Other Ambulatory Visit: Payer: Self-pay

## 2020-04-02 ENCOUNTER — Other Ambulatory Visit: Payer: Self-pay

## 2020-04-02 ENCOUNTER — Ambulatory Visit (INDEPENDENT_AMBULATORY_CARE_PROVIDER_SITE_OTHER): Payer: Medicare HMO | Admitting: Nurse Practitioner

## 2020-04-02 ENCOUNTER — Encounter: Payer: Self-pay | Admitting: Nurse Practitioner

## 2020-04-02 VITALS — BP 131/69 | HR 73 | Temp 97.5°F | Resp 20 | Ht 70.0 in | Wt 181.0 lb

## 2020-04-02 DIAGNOSIS — I2510 Atherosclerotic heart disease of native coronary artery without angina pectoris: Secondary | ICD-10-CM | POA: Diagnosis not present

## 2020-04-02 DIAGNOSIS — I4891 Unspecified atrial fibrillation: Secondary | ICD-10-CM | POA: Diagnosis not present

## 2020-04-02 LAB — COAGUCHEK XS/INR WAIVED
INR: 2.5 — ABNORMAL HIGH (ref 0.9–1.1)
Prothrombin Time: 30.2 s

## 2020-04-02 MED ORDER — WARFARIN SODIUM 5 MG PO TABS: ORAL_TABLET | ORAL | 3 refills | Status: DC

## 2020-04-02 NOTE — Progress Notes (Signed)
. °  Subjective:     Indication: atrial fibrillation Bleeding signs/symptoms: None Thromboembolic signs/symptoms: None  Missed Coumadin doses: None Medication changes: no Dietary changes: no Bacterial/viral infection: no Other concerns: no  The following portions of the patient's history were reviewed and updated as appropriate: allergies, current medications, past family history, past medical history, past social history, past surgical history and problem list.  Review of Systems Pertinent items noted in HPI and remainder of comprehensive ROS otherwise negative.    Objective:    INR Today: 2.5 Current dose: 7.5 mg Tuesday, Thursday, Saturday                         5 mg all other days   Assessment:    Therapeutic INR for goal of 2-3   Plan:    1. New dose: no change   2. Next INR: 8 weeks    Review of Systems  All other systems reviewed and are negative.   Physical Exam Vitals reviewed.  HENT:     Head: Normocephalic.  Eyes:     Conjunctiva/sclera: Conjunctivae normal.  Cardiovascular:     Rate and Rhythm: Normal rate and regular rhythm.     Pulses: Normal pulses.     Heart sounds: Normal heart sounds.  Pulmonary:     Effort: Pulmonary effort is normal.     Breath sounds: Normal breath sounds.  Abdominal:     General: Bowel sounds are normal.  Skin:    General: Skin is warm.  Neurological:     Mental Status: He is alert and oriented to person, place, and time.  Psychiatric:        Mood and Affect: Mood normal.

## 2020-04-02 NOTE — Assessment & Plan Note (Addendum)
Patient is a 69 year old male who presents to clinic for follow-up arterial fibrillation.  Patient completed INR today.  INR 2.5 patient's goal is 2-3 patient is therapeutic No changes to current medication dose.  Provided education to patient Printed handouts given.  Patient follow-up in 8 weeks.    Refill sent to pharmacy.

## 2020-04-02 NOTE — Patient Instructions (Signed)
Prothrombin Time, International Normalized Ratio Test Why am I having this test? A prothrombin time (pro-time, PT) test may be ordered if:  You have certain medical conditions that cause abnormal bleeding or blood clotting. These can include: ? Liver disease. ? Systemic infection (sepsis). ? Inherited (genetic) bleeding disorders.  You are taking a medicine to prevent excessive blood clotting (anticoagulant), such as warfarin. ? If you are taking warfarin, you will likely be asked to have this test done at regular intervals. The results of this test will help your health care provider determine what dose of warfarin you need based on how quickly or slowly your blood clots. It is very important to have this test done as often as your health care provider recommends. What is being tested? A prothrombin time (pro-time, PT) test measures how many seconds it takes your blood to clot. The international normalized ratio (INR) is a calculation of blood clotting time based on your PT result. Most labs report both PT and INR values when reporting blood clotting times. What kind of sample is taken?  A blood sample is required for this test. It is usually collected by inserting a needle into a blood vessel. Tell a health care provider about:  Any blood disorders you have.  All medicines you are taking, including vitamins, herbs, eye drops, creams, and over-the-counter medicines. Do not stop, add, or change any medicines without letting your health care provider know.  The foods you regularly eat, especially foods that contain moderate or high amounts of vitamin K. It is important to eat a consistent amount of foods rich in vitamin K. Let your health care provider know if you have recently changed your diet.  If you drink alcohol. This can affect your lab results. How are the results reported? Your test results will be reported as values. Your health care provider will compare your results to normal  ranges that were established after testing a large group of people (reference ranges). Reference ranges may vary among different labs and hospitals. For this test, common reference ranges are:  Without anticoagulant treatment (control value): 11.0-12.5 seconds; 85-100%.  INR: 0.8-1.1. If you are taking warfarin, talk with your health care provider about what your INR result should be. Generally, an INR of 2.0-3.0 is desired for blood clot prevention. This depends on your medical conditions. What do the results mean?  A higher than normal PT or INR means that your blood takes longer to form a clot. This can result from: ? Certain medicines. ? Liver disease. ? Lack of certain proteins that form clots (coagulation factors). ? Lack of some vitamins.  A lower than normal PT or INR means that your blood can form a clot easily. This can result from: ? Supplements that contain Vitamin K. ? Medicines that contain estrogen, such as birth control pills or hormone replacement. ? Cancer. ? Some blood disorders (disseminated intravascular coagulation). Talk with your health care provider about what your test results mean. If you are taking warfarin or another anticoagulant, your result ranges may be different. Talk to your health care provider about what your results should be. Questions to ask your health care provider Ask your health care provider or the department that is doing the test:  When will my results be ready?  How will I get my results?  What are my treatment options?  What other tests do I need?  What are my next steps? Summary  A prothrombin time (pro-time, PT) test measures how   many seconds it takes your blood to clot.  You may have this test if you have a medical condition that causes abnormal bleeding or blood clotting, or if you are taking a medicine to prevent abnormal blood clotting.  A test result that is higher than normal indicates that your blood is taking too long  to form a clot. This result may occur because you lack some vitamins, take certain medicines, or have certain medical conditions.  Talk with your health care provider about what your results mean. This information is not intended to replace advice given to you by your health care provider. Make sure you discuss any questions you have with your health care provider. Document Revised: 08/11/2017 Document Reviewed: 08/11/2017 Elsevier Patient Education  2020 [employer] Atrial Fibrillation  Atrial fibrillation is a type of heartbeat that is irregular or fast. If you have this condition, your heart beats without any order. This makes it hard for your heart to pump blood in a normal way. Atrial fibrillation may come and go, or it may become a long-lasting problem. If this condition is not treated, it can put you at higher risk for stroke, heart failure, and other heart problems. What are the causes? This condition may be caused by diseases that damage the heart. They include:  High blood pressure.  Heart failure.  Heart valve disease.  Heart surgery. Other causes include:  Diabetes.  Thyroid disease.  Being overweight.  Kidney disease. Sometimes the cause is not known. What increases the risk? You are more likely to develop this condition if:  You are older.  You smoke.  You exercise often and very hard.  You have a family history of this condition.  You are a man.  You use drugs.  You drink a lot of alcohol.  You have lung conditions, such as emphysema, pneumonia, or COPD.  You have sleep apnea. What are the signs or symptoms? Common symptoms of this condition include:  A feeling that your heart is beating very fast.  Chest pain or discomfort.  Feeling short of breath.  Suddenly feeling light-headed or weak.  Getting tired easily during activity.  Fainting.  Sweating. In some cases, there are no symptoms. How is this treated? Treatment for this  condition depends on underlying conditions and how you feel when you have atrial fibrillation. They include:  Medicines to: ? Prevent blood clots. ? Treat heart rate or heart rhythm problems.  Using devices, such as a pacemaker, to correct heart rhythm problems.  Doing surgery to remove the part of the heart that sends bad signals.  Closing an area where clots can form in the heart (left atrial appendage). In some cases, your doctor will treat other underlying conditions. Follow these instructions at home: Medicines  Take over-the-counter and prescription medicines only as told by your doctor.  Do not take any new medicines without first talking to your doctor.  If you are taking blood thinners: ? Talk with your doctor before you take any medicines that have aspirin or NSAIDs, such as ibuprofen, in them. ? Take your medicine exactly as told by your doctor. Take it at the same time each day. ? Avoid activities that could hurt or bruise you. Follow instructions about how to prevent falls. ? Wear a bracelet that says you are taking blood thinners. Or, carry a card that lists what medicines you take. Lifestyle      Do not use any products that have nicotine or tobacco in them.   These include cigarettes, e-cigarettes, and chewing tobacco. If you need help quitting, ask your doctor.  Eat heart-healthy foods. Talk with your doctor about the right eating plan for you.  Exercise regularly as told by your doctor.  Do not drink alcohol.  Lose weight if you are overweight.  Do not use drugs, including cannabis. General instructions  If you have a condition that causes breathing to stop for a short period of time (apnea), treat it as told by your doctor.  Keep a healthy weight. Do not use diet pills unless your doctor says they are safe for you. Diet pills may make heart problems worse.  Keep all follow-up visits as told by your doctor. This is important. Contact a doctor if:  You  notice a change in the speed, rhythm, or strength of your heartbeat.  You are taking a blood-thinning medicine and you get more bruising.  You get tired more easily when you move or exercise.  You have a sudden change in weight. Get help right away if:   You have pain in your chest or your belly (abdomen).  You have trouble breathing.  You have side effects of blood thinners, such as blood in your vomit, poop (stool), or pee (urine), or bleeding that cannot stop.  You have any signs of a stroke. "BE FAST" is an easy way to remember the main warning signs: ? B - Balance. Signs are dizziness, sudden trouble walking, or loss of balance. ? E - Eyes. Signs are trouble seeing or a change in how you see. ? F - Face. Signs are sudden weakness or loss of feeling in the face, or the face or eyelid drooping on one side. ? A - Arms. Signs are weakness or loss of feeling in an arm. This happens suddenly and usually on one side of the body. ? S - Speech. Signs are sudden trouble speaking, slurred speech, or trouble understanding what people say. ? T - Time. Time to call emergency services. Write down what time symptoms started.  You have other signs of a stroke, such as: ? A sudden, very bad headache with no known cause. ? Feeling like you may vomit (nausea). ? Vomiting. ? A seizure. These symptoms may be an emergency. Do not wait to see if the symptoms will go away. Get medical help right away. Call your local emergency services (911 in the [place] Do not drive yourself to the hospital. Summary  Atrial fibrillation is a type of heartbeat that is irregular or fast.  You are at higher risk of this condition if you smoke, are older, have diabetes, or are overweight.  Follow your doctor's instructions about medicines, diet, exercise, and follow-up visits.  Get help right away if you have signs or symptoms of a stroke.  Get help right away if you cannot catch your breath, or you have chest pain  or discomfort. This information is not intended to replace advice given to you by your health care provider. Make sure you discuss any questions you have with your health care provider. Document Revised: 12/18/2018 Document Reviewed: 12/18/2018 Elsevier Patient Education  2020 Elsevier Inc.  

## 2020-04-09 ENCOUNTER — Telehealth: Payer: Self-pay | Admitting: Cardiology

## 2020-04-09 NOTE — Telephone Encounter (Signed)
Tried to CB pt...phone just keeps ringing.Marland KitchennoVm

## 2020-04-09 NOTE — Telephone Encounter (Signed)
New message:     Patient stating that the doctor told him that he wanted to see him in [place] in [place]. There isn't a apt till 07/14/20. Please call patient.

## 2020-04-09 NOTE — Telephone Encounter (Signed)
Unable to leave VM mailbox is full

## 2020-04-27 NOTE — Telephone Encounter (Signed)
Scheduled pt for next available in [place] with Dr Antoine Poche.

## 2020-05-05 NOTE — Telephone Encounter (Signed)
Called and attempted to schedule pt for an opening in the [place] office.  Pt states he can not make it today.  Reviewed scheduled appt date and time 12/8 at 11 AM.  He states he can come to that appt.

## 2020-05-28 ENCOUNTER — Ambulatory Visit (INDEPENDENT_AMBULATORY_CARE_PROVIDER_SITE_OTHER): Payer: Medicare HMO | Admitting: Family

## 2020-05-28 ENCOUNTER — Other Ambulatory Visit: Payer: Self-pay

## 2020-05-28 ENCOUNTER — Encounter: Payer: Self-pay | Admitting: Family

## 2020-05-28 VITALS — BP 139/89 | HR 74 | Temp 96.9°F | Ht 70.0 in | Wt 173.4 lb

## 2020-05-28 DIAGNOSIS — Z7901 Long term (current) use of anticoagulants: Secondary | ICD-10-CM | POA: Diagnosis not present

## 2020-05-28 DIAGNOSIS — Z720 Tobacco use: Secondary | ICD-10-CM

## 2020-05-28 DIAGNOSIS — I2510 Atherosclerotic heart disease of native coronary artery without angina pectoris: Secondary | ICD-10-CM

## 2020-05-28 DIAGNOSIS — I4891 Unspecified atrial fibrillation: Secondary | ICD-10-CM

## 2020-05-28 DIAGNOSIS — F411 Generalized anxiety disorder: Secondary | ICD-10-CM

## 2020-05-28 DIAGNOSIS — E785 Hyperlipidemia, unspecified: Secondary | ICD-10-CM | POA: Diagnosis not present

## 2020-05-28 DIAGNOSIS — F1010 Alcohol abuse, uncomplicated: Secondary | ICD-10-CM

## 2020-05-28 DIAGNOSIS — J42 Unspecified chronic bronchitis: Secondary | ICD-10-CM

## 2020-05-28 DIAGNOSIS — Z1211 Encounter for screening for malignant neoplasm of colon: Secondary | ICD-10-CM | POA: Diagnosis not present

## 2020-05-28 DIAGNOSIS — I10 Essential (primary) hypertension: Secondary | ICD-10-CM | POA: Diagnosis not present

## 2020-05-28 DIAGNOSIS — I429 Cardiomyopathy, unspecified: Secondary | ICD-10-CM

## 2020-05-28 LAB — COAGUCHEK XS/INR WAIVED
INR: 3 — ABNORMAL HIGH (ref 0.9–1.1)
Prothrombin Time: 35.7 s

## 2020-05-28 MED ORDER — TRELEGY ELLIPTA 100-62.5-25 MCG/INH IN AEPB: 1.0000 | INHALATION_SPRAY | Freq: Every day | RESPIRATORY_TRACT | 2 refills | Status: DC

## 2020-05-28 NOTE — Progress Notes (Signed)
Subjective:    Patient ID: Jason Spencer, male    DOB: 1950/11/14, 69 y.o.   MRN: [id number]  Chief Complaint  Patient presents with  . Anticoagulation   Pt presents to the office today for INR check. He is currently taking warfarin for A Fib. See anticoagulation flow sheet. Denies any bleeding, changes in diet, or bruising.  His INR today is 3.0. He is currently taking 7.5 mg (1 1/2 tabs)  Tuesday , Thursday,  Saturday, and 5 mg (1 tab) all other days.   He does have a hx of alcohol abuse and admits to drinking 6 larger beers a day. He is followed by Cardiologists for A Fib, CAD, and Cardiomyopathy.  Hyperlipidemia This is a chronic problem. The current episode started more than 1 year ago. The problem is uncontrolled. Pertinent negatives include no shortness of breath. Current antihyperlipidemic treatment includes statins. The current treatment provides moderate improvement of lipids. Risk factors for coronary artery disease include dyslipidemia, male sex, hypertension and a sedentary lifestyle.  Hypertension This is a chronic problem. The current episode started more than 1 year ago. The problem has been resolved since onset. The problem is controlled. Associated symptoms include malaise/fatigue. Pertinent negatives include no peripheral edema or shortness of breath. Risk factors for coronary artery disease include obesity and sedentary lifestyle. The current treatment provides moderate improvement.  Nicotine Dependence Presents for follow-up visit. His urge triggers include company of smokers. He smokes 1 pack of cigarettes per day.  COPD Continues to smoke 1/2 - 1 pack a day. He has not used his Trelegy in a few days. States his breathing is stable.     Review of Systems  Constitutional: Positive for malaise/fatigue.  Respiratory: Negative for shortness of breath.   All other systems reviewed and are negative.      Objective:   Physical Exam Vitals reviewed.    Constitutional:      General: He is not in acute distress.    Appearance: He is well-developed.  HENT:     Head: Normocephalic.     Right Ear: Tympanic membrane normal.     Left Ear: Tympanic membrane normal.  Eyes:     General:        Right eye: No discharge.        Left eye: No discharge.     Pupils: Pupils are equal, round, and reactive to light.  Neck:     Thyroid: No thyromegaly.  Cardiovascular:     Rate and Rhythm: Normal rate and regular rhythm.     Heart sounds: Normal heart sounds. No murmur heard.   Pulmonary:     Effort: Pulmonary effort is normal. No respiratory distress.     Breath sounds: Rhonchi present. No wheezing.  Abdominal:     General: Bowel sounds are normal. There is no distension.     Palpations: Abdomen is soft.     Tenderness: There is no abdominal tenderness.  Musculoskeletal:        General: No tenderness. Normal range of motion.     Cervical back: Normal range of motion and neck supple.  Skin:    General: Skin is warm and dry.     Findings: No erythema or rash.  Neurological:     Mental Status: He is alert and oriented to person, place, and time.     Cranial Nerves: No cranial nerve deficit.     Deep Tendon Reflexes: Reflexes are normal and symmetric.  Psychiatric:  Behavior: Behavior normal.        Thought Content: Thought content normal.        Judgment: Judgment normal.       BP 139/89   Pulse 74   Temp (!) 96.9 F (36.1 C) (Temporal)   Ht _0  (1.778 m)   Wt 173 lb 6.4 oz (78.7 kg)   SpO2 98%   BMI 24.88 kg/m      Assessment & Plan:  Elward Nocera comes in today with chief complaint of Anticoagulation   Diagnosis and orders addressed:  1. Atrial fibrillation, unspecified type ([facility]) - CoaguChek XS/INR Waived - BMP8+EGFR - Hepatic function panel - CBC with Differential/Platelet  2. Atrial fibrillation with RVR (HCC) - BMP8+EGFR - Hepatic function panel - CBC with Differential/Platelet  3. Essential  hypertension, benign - BMP8+EGFR - Hepatic function panel - CBC with Differential/Platelet  4. Chronic bronchitis, unspecified chronic bronchitis type ([place]) - Fluticasone-Umeclidin-Vilant (TRELEGY ELLIPTA) 100-62.5-25 MCG/INH AEPB; Inhale 1 puff into the lungs daily.  Dispense: 60 each; Refill: 2 - BMP8+EGFR - Hepatic function panel - CBC with Differential/Platelet  5. Tobacco abuse - BMP8+EGFR - Hepatic function panel - CBC with Differential/Platelet  6. Hyperlipidemia, unspecified hyperlipidemia type - BMP8+EGFR - Hepatic function panel - CBC with Differential/Platelet  7. GAD (generalized anxiety disorder) - BMP8+EGFR - Hepatic function panel - CBC with Differential/Platelet  8. Alcohol abuse - BMP8+EGFR - Hepatic function panel - CBC with Differential/Platelet  9. Current use of anticoagulant therapy - BMP8+EGFR - Hepatic function panel - CBC with Differential/Platelet  10. Colon cancer screening - Cologuard - BMP8+EGFR - Hepatic function panel - CBC with Differential/Platelet  11. Cardiomyopathy, unspecified type ([place])  12. Coronary artery disease involving native coronary artery of native heart without angina pectoris    Labs pending Health Maintenance reviewed Diet and exercise encouraged  Follow up plan: 1 month for INR   [name], FNP

## 2020-05-28 NOTE — Patient Instructions (Signed)
Description   INR today is 3.0 (goal 2-3) Perfect!  Continue current dose of 7.5 mg (1 1/2 tabs)  Tuesday , Thursday,  Saturday and  5 mg (1 tab) all other days.

## 2020-05-29 LAB — BMP8+EGFR
BUN/Creatinine Ratio: 7 — ABNORMAL LOW (ref 10–24)
BUN: 10 mg/dL (ref 8–27)
CO2: 23 mmol/L (ref 20–29)
Calcium: 9.4 mg/dL (ref 8.6–10.2)
Chloride: 94 mmol/L — ABNORMAL LOW (ref 96–106)
Creatinine, Ser: 1.39 mg/dL — ABNORMAL HIGH (ref 0.76–1.27)
GFR calc Af Amer: 60 mL/min/{1.73_m2} (ref >59)
GFR calc non Af Amer: 52 mL/min/{1.73_m2} — ABNORMAL LOW (ref >59)
Glucose: 91 mg/dL (ref 65–99)
Potassium: 4.9 mmol/L (ref 3.5–5.2)
Sodium: 131 mmol/L — ABNORMAL LOW (ref 134–144)

## 2020-05-29 LAB — HEPATIC FUNCTION PANEL
ALT: 30 IU/L (ref 0–44)
AST: 54 IU/L — ABNORMAL HIGH (ref 0–40)
Albumin: 3.8 g/dL (ref 3.8–4.8)
Alkaline Phosphatase: 80 IU/L (ref 44–121)
Bilirubin Total: 0.6 mg/dL (ref 0.0–1.2)
Bilirubin, Direct: 0.23 mg/dL (ref 0.00–0.40)
Total Protein: 7.7 g/dL (ref 6.0–8.5)

## 2020-05-29 LAB — CBC WITH DIFFERENTIAL/PLATELET
Basophils Absolute: 0 10*3/uL (ref 0.0–0.2)
Basos: 1 %
EOS (ABSOLUTE): 0.1 10*3/uL (ref 0.0–0.4)
Eos: 2 %
Hematocrit: 39.1 % (ref 37.5–51.0)
Hemoglobin: 13.5 g/dL (ref 13.0–17.7)
Immature Grans (Abs): 0 10*3/uL (ref 0.0–0.1)
Immature Granulocytes: 1 %
Lymphocytes Absolute: 1 10*3/uL (ref 0.7–3.1)
Lymphs: 18 %
MCH: 34.2 pg — ABNORMAL HIGH (ref 26.6–33.0)
MCHC: 34.5 g/dL (ref 31.5–35.7)
MCV: 99 fL — ABNORMAL HIGH (ref 79–97)
Monocytes Absolute: 0.7 10*3/uL (ref 0.1–0.9)
Monocytes: 12 %
Neutrophils Absolute: 3.8 10*3/uL (ref 1.4–7.0)
Neutrophils: 66 %
Platelets: 188 10*3/uL (ref 150–450)
RBC: 3.95 x10E6/uL — ABNORMAL LOW (ref 4.14–5.80)
RDW: 11.8 % (ref 11.6–15.4)
WBC: 5.7 10*3/uL (ref 3.4–10.8)

## 2020-06-15 NOTE — Progress Notes (Deleted)
Cardiology Office Note   Date:  06/15/2020   ID:  Jason Spencer, DOB 1951/02/10, MRN [id number]  PCP:  [name], FNP  Cardiologist:   Rollene Rotunda, MD   No chief complaint on file.     History of Present Illness: Jason Spencer is a 69 y.o. male who presents for follow-up of cardiomyopathy, arrhythmia and coronary disease. I last saw him in 2016.  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 He had a diagnostic cardiac catheterization and was found to have high-grade right coronary artery stenosis which was treated with stenting.  However, previously he was not really thought to have an ischemic etiology as the coronary disease is out of proportion to the global myopathy.  He is also had nonadherence or noncompliance with lifestyle changes.  He underwent cardioversion.  He went back into atrial fibrillation.   His EF was much better in Jan at 50%.    At the last visit he was not taking all of his meds.  ***   *** He is unfortunately still smoking cigarettes and drinks at least a sixpack a night.  He says he is try to cut back on the cigarettes.  Also unfortunate is the fact that he is not taking all his medications.  I do think he is taking his beta-blocker but probably he is not taking his Norvasc, hydrochlorothiazide or ACE inhibitor.  I do see that he is taking his anticoagulation because he has fluctuating INR and for the most part has been therapeutic.  He is not noticing any palpitations, presyncope or syncope.  Has had no chest x-ray, neck or arm discomfort.  He said no weight gain or edema.   Past Medical History:  Diagnosis Date  . Acute CHF (HCC)    [name] 08/13/2014  . Atrial fibrillation with RVR (HCC) 08/13/2014   new onset/notes 08/13/2014  . CAP (community acquired pneumonia) 07/2014  . Cardiomyopathy (HCC)   . Migraine 1970     Past Surgical History:  Procedure Laterality Date  . CARDIOVERSION N/A 08/19/2014   Procedure: CARDIOVERSION;  Surgeon:  Laurey Morale, MD;  Location: [facility] ENDOSCOPY;  Service: Cardiovascular;  Laterality: N/A;  . ENDOVENOUS ABLATION SAPHENOUS VEIN W/ LASER Right 05/01/2018   endovenous laser ablation right greater saphenous vein by [name] MD   . LEFT AND RIGHT HEART CATHETERIZATION WITH CORONARY ANGIOGRAM N/A 08/17/2014   Procedure: LEFT AND RIGHT HEART CATHETERIZATION WITH CORONARY ANGIOGRAM;  Surgeon: Lesleigh Noe, MD;  Location: [facility] CATH LAB;  Service: Cardiovascular;  Laterality: N/A;  . NO PAST SURGERIES    . PERCUTANEOUS CORONARY STENT INTERVENTION (PCI-S)  08/17/2014   Procedure: PERCUTANEOUS CORONARY STENT INTERVENTION (PCI-S);  Surgeon: Lesleigh Noe, MD;  Location[facility] C[facility] LAB;  Service: Cardiovascular;;  . TEE WITHOUT CARDIOVERSION N/A 08/19/2014   Procedure: TRANSESOPHAGEAL ECHOCARDIOGRAM (TEE);  Surgeon: Laurey Morale, MD;  Location: [facility] ENDOSCOPY;  Service: Cardiovascular;  Laterality: N/A;     Current Outpatient Medications  Medication Sig Dispense Refill  . amLODipine (NORVASC) 5 MG tablet Take 1 tablet (5 mg total) by mouth daily. 90 tablet 3  . atorvastatin (LIPITOR) 20 MG tablet Take 1 tablet (20 mg total) by mouth daily. 90 tablet 3  . augmented betamethasone dipropionate (DIPROLENE-AF) 0.05 % cream Apply topically 2 (two) times daily. 50 g 2  . Fluticasone-Umeclidin-Vilant (TRELEGY ELLIPTA) 100-62.5-25 MCG/INH AEPB Inhale 1 puff into the lungs daily. 60 each 2  . lisinopril (  ZESTRIL) 40 MG tablet Take 1 tablet (40 mg total) by mouth daily. 90 tablet 3  . metoprolol succinate (TOPROL XL) 100 MG 24 hr tablet Take 1 tablet (100 mg total) by mouth daily. Take with or immediately following a meal. 90 tablet 3  . warfarin (COUMADIN) 5 MG tablet TAKE (1) TABLET AS DIR- ECTED BY COUMADIN CLINIC 50 tablet 3   No current facility-administered medications for this visit.    Allergies:   Shellfish allergy   ROS:  Please see the history of present illness.   Otherwise, review of systems  are positive for ***.   All other systems are reviewed and negative.    PHYSICAL EXAM: VS:  There were no vitals taken for this visit. , BMI There is no height or weight on file to calculate BMI. GENERAL:  Well appearing NECK:  No jugular venous distention, waveform within normal limits, carotid upstroke brisk and symmetric, no bruits, no thyromegaly LUNGS:  Clear to auscultation bilaterally CHEST:  Unremarkable HEART:  PMI not displaced or sustained,S1 and S2 within normal limits, no S3, no clicks, no rubs, *** murmurs, irregular  ABD:  Flat, positive bowel sounds normal in frequency in pitch, no bruits, no rebound, no guarding, no midline pulsatile mass, no hepatomegaly, no splenomegaly EXT:  2 plus pulses throughout, no edema, no cyanosis no clubbing    ***GENERAL:  Well appearing NECK:  No jugular venous distention, waveform within normal limits, carotid upstroke brisk and symmetric, no bruits, no thyromegaly LUNGS:  Clear to auscultation bilaterally CHEST:  Unremarkable HEART:  PMI not displaced or sustained,S1 within normal limits, no S3, no S4, no clicks, no rubs, no murmurs, irregular ABD:  Flat, positive bowel sounds normal in frequency in pitch, no bruits, no rebound, no guarding, no midline pulsatile mass, no hepatomegaly, no splenomegaly EXT:  2 plus pulses throughout, no edema, no cyanosis no clubbing   EKG:  EKG is *** ordered today. The ekg ordered today demonstrates atrial fibrillation, rate ***, axis within normal limits, intervals within normal limits, repolarization changes.   Recent Labs: 05/28/2020: ALT 30; BUN 10; Creatinine, Ser 1.39; Hemoglobin 13.5; Platelets 188; Potassium 4.9; Sodium 131    Lipid Panel    Component Value Date/Time   CHOL 239 (H) 08/01/2019 1318   TRIG 153 (H) 08/01/2019 1318   HDL 102 08/01/2019 1318   CHOLHDL 2.3 08/01/2019 1318   CHOLHDL 2.4 08/14/2014 0238   VLDL 9 08/14/2014 0238   LDLCALC 111 (H) 08/01/2019 1318      Wt  Readings from Last 3 Encounters:  05/28/20 173 lb 6.4 oz (78.7 kg)  04/02/20 181 lb (82.1 kg)  03/03/20 177 lb 12.8 oz (80.6 kg)      Other studies Reviewed: Additional studies/ records that were reviewed today include: *** Review of the above records demonstrates:  Please see elsewhere in the note.     ASSESSMENT AND PLAN:  ATRIAL FLUTTER:   Mr. Jason Spencer has a CHA2DS2 - VASc score of 2 with *** with a risk of stroke of 2.2%.    We cannot really tell the difference.  He tolerates anticoagulation.  No change in therapy.   CAD:   The patient has *** no new sypmtoms.  No further cardiovascular testing is indicated.  We will continue with aggressive risk reduction and meds as listed.  CARDIOMYOPATHY:  EF was 50%.  ***  much improved.   I pointed out the very significant role that medications play in this and  he thinks he will get more compliant with these.  Cost would be a consideration so I cannot switch to [employer].  HTN:   Blood pressure is *** markedly elevated but he is not taking the medicines as prescribed.  He says he will get back on his Norvasc, hydrochlorothiazide and lisinopril and we will continue with the beta-blocker.   DYSLIPIDEMIA:   ***  He has not been on a statin or other therapy because of muscle aches.  ETOH:   ***He understands the role that alcohol might play and he is advised to stop.   PROLONGED QT:  *** QT is very mildly prolonged.  He has no symptoms.  No change in therapy.   TOBACCO:   *** He is cutting down on his cigarettes and he understands need to stop smoking completely.  COVID EDUCATION:   ***  He has not had his vaccines.  He was hesitant but I did educate him and hopefully would comply.   Current medicines are reviewed at length with the patient today.  The patient does not have concerns regarding medicines.  The following changes have been made:  no change  Labs/ tests ordered today include:   No orders of the defined types were placed  in this encounter.   Disposition:   FU with me in 6 months.     Signed, Rollene Rotunda, MD  06/15/2020 8:26 PM    [facility]

## 2020-06-16 ENCOUNTER — Ambulatory Visit: Payer: Medicare HMO | Admitting: Cardiology

## 2020-06-16 DIAGNOSIS — I429 Cardiomyopathy, unspecified: Secondary | ICD-10-CM

## 2020-06-16 DIAGNOSIS — I2510 Atherosclerotic heart disease of native coronary artery without angina pectoris: Secondary | ICD-10-CM

## 2020-06-16 DIAGNOSIS — I4819 Other persistent atrial fibrillation: Secondary | ICD-10-CM

## 2020-06-16 DIAGNOSIS — E785 Hyperlipidemia, unspecified: Secondary | ICD-10-CM

## 2020-06-16 DIAGNOSIS — R9431 Abnormal electrocardiogram [ECG] [EKG]: Secondary | ICD-10-CM

## 2020-06-28 ENCOUNTER — Encounter: Payer: Self-pay | Admitting: Family

## 2020-06-28 ENCOUNTER — Ambulatory Visit (INDEPENDENT_AMBULATORY_CARE_PROVIDER_SITE_OTHER): Payer: Medicare HMO | Admitting: Family

## 2020-06-28 ENCOUNTER — Other Ambulatory Visit: Payer: Self-pay

## 2020-06-28 VITALS — BP 123/75 | HR 76 | Temp 95.5°F | Ht 70.0 in | Wt 171.6 lb

## 2020-06-28 DIAGNOSIS — E663 Overweight: Secondary | ICD-10-CM | POA: Diagnosis not present

## 2020-06-28 DIAGNOSIS — I4892 Unspecified atrial flutter: Secondary | ICD-10-CM | POA: Diagnosis not present

## 2020-06-28 DIAGNOSIS — F1010 Alcohol abuse, uncomplicated: Secondary | ICD-10-CM

## 2020-06-28 DIAGNOSIS — Z7189 Other specified counseling: Secondary | ICD-10-CM | POA: Diagnosis not present

## 2020-06-28 DIAGNOSIS — Z7901 Long term (current) use of anticoagulants: Secondary | ICD-10-CM | POA: Diagnosis not present

## 2020-06-28 DIAGNOSIS — I4891 Unspecified atrial fibrillation: Secondary | ICD-10-CM

## 2020-06-28 LAB — COAGUCHEK XS/INR WAIVED
INR: 3.4 — ABNORMAL HIGH (ref 0.9–1.1)
Prothrombin Time: 41 s

## 2020-06-28 NOTE — Patient Instructions (Signed)
Description   INR today is 3.4 (goal 2-3) Too thin.   Hold today's dose (06/28/20), then decrease dose of 7.5 mg (1 1/2 tabs)  Tuesday , Thursday,  5 mg (1 tab) all other days.

## 2020-06-28 NOTE — Progress Notes (Signed)
Subjective:    Patient ID: Jason Spencer, male    DOB: [birth year], 69 y.o.   MRN: [id number]  Chief Complaint  Patient presents with  . Coagulation Disorder    HPI Pt presents to the office today for INR check. He is currently taking warfarin for A Fib. See anticoagulation flow sheet.Denies any bleeding, changes in diet, or bruising.  His INR today is 3.4. He is currently taking 7.5 mg (1 1/2 tabs)  Tuesday , Thursday,  Saturday, and 5 mg (1 tab) all other days.   He does have a hx of alcohol abuse and admits to drinking 6 larger beers a day. He is followed by Cardiologists for A Fib, CAD, and Cardiomyopathy.    Review of Systems  All other systems reviewed and are negative.      Objective:   Physical Exam Vitals reviewed.  Constitutional:      General: He is not in acute distress.    Appearance: He is well-developed and well-nourished.  HENT:     Head: Normocephalic.     Mouth/Throat:     Mouth: Oropharynx is clear and moist.  Eyes:     General:        Right eye: No discharge.        Left eye: No discharge.     Pupils: Pupils are equal, round, and reactive to light.  Neck:     Thyroid: No thyromegaly.  Cardiovascular:     Rate and Rhythm: Normal rate and regular rhythm.     Pulses: Intact distal pulses.     Heart sounds: Normal heart sounds. No murmur heard.   Pulmonary:     Effort: Pulmonary effort is normal. No respiratory distress.     Breath sounds: Normal breath sounds. No wheezing.  Abdominal:     General: Bowel sounds are normal. There is no distension.     Palpations: Abdomen is soft.     Tenderness: There is no abdominal tenderness.  Musculoskeletal:        General: No tenderness or edema. Normal range of motion.     Cervical back: Normal range of motion and neck supple.  Skin:    General: Skin is warm and dry.     Findings: Bruising and erythema present. No rash.  Neurological:     Mental Status: He is alert and oriented to person, place,  and time.     Cranial Nerves: No cranial nerve deficit.     Deep Tendon Reflexes: Reflexes are normal and symmetric.  Psychiatric:        Mood and Affect: Mood and affect normal.        Behavior: Behavior normal.        Thought Content: Thought content normal.        Judgment: Judgment normal.       BP 123/75   Pulse 76   Temp (!) 95.5 F (35.3 C) (Temporal)   Ht 5\' 10"  (1.778 m)   Wt 171 lb 9.6 oz (77.8 kg)   BMI 24.62 kg/m      Assessment & Plan:  Jason Spencer comes in today with chief complaint of Coagulation Disorder   Diagnosis and orders addressed:  1. Atrial fibrillation, unspecified type (HCC) - CoaguChek XS/INR Waived  2. Atrial fibrillation with RVR (HCC)  3. Atrial fibrillation, new onset w/ RVR  4. Atrial flutter, unspecified type (HCC)  5. Overweight (BMI 25.0-29.9)  6. Current use of anticoagulant therapy  7. Alcohol abuse  8.  Educated about COVID-19 virus infection Description   INR today is 3.4 (goal 2-3) Too thin.   Hold today's dose (06/28/20), then decrease dose of 7.5 mg (1 1/2 tabs)  Tuesday , Thursday,  5 mg (1 tab) all other days.        Health Maintenance reviewed Diet and exercise encouraged  Follow up plan: 2 weeks    Jannifer Rodney, FNP

## 2020-07-13 ENCOUNTER — Other Ambulatory Visit: Payer: Self-pay

## 2020-07-13 ENCOUNTER — Ambulatory Visit (INDEPENDENT_AMBULATORY_CARE_PROVIDER_SITE_OTHER): Payer: Medicare HMO | Admitting: Family

## 2020-07-13 ENCOUNTER — Encounter: Payer: Self-pay | Admitting: Family

## 2020-07-13 VITALS — BP 129/71 | HR 79 | Temp 95.9°F | Ht 70.0 in | Wt 176.4 lb

## 2020-07-13 DIAGNOSIS — I4891 Unspecified atrial fibrillation: Secondary | ICD-10-CM | POA: Diagnosis not present

## 2020-07-13 DIAGNOSIS — I4892 Unspecified atrial flutter: Secondary | ICD-10-CM | POA: Diagnosis not present

## 2020-07-13 LAB — COAGUCHEK XS/INR WAIVED
INR: 2.1 — ABNORMAL HIGH (ref 0.9–1.1)
Prothrombin Time: 24.7 s

## 2020-07-13 NOTE — Patient Instructions (Signed)
Description   INR today is 2.1 (goal 2-3) Perfect   Continue current  dose of 7.5 mg (1 1/2 tabs)  Tuesday , Thursday,  5 mg (1 tab) all other days.

## 2020-07-13 NOTE — Progress Notes (Signed)
Subjective:    Patient ID: Jason Spencer, male    DOB: Nov 18, 1950, 70 y.o.   MRN: [id number]  Chief Complaint  Patient presents with  . Anticoagulation    HPI Pt presents to the office today for INR check. He is currently taking warfarin for A Fib. See anticoagulation flow sheet.   Denies any bleeding, changes in diet, or bruising.  He is currently taking 7.5 mg on Tuesday and Thursday and 5 mg on all other days.   His INR today is 2.1 and is at goal today.   Review of Systems  All other systems reviewed and are negative.      Objective:   Physical Exam Vitals reviewed.  Constitutional:      General: He is not in acute distress.    Appearance: He is well-developed and well-nourished.  HENT:     Head: Normocephalic.     Right Ear: Tympanic membrane normal.     Left Ear: Tympanic membrane normal.     Mouth/Throat:     Mouth: Oropharynx is clear and moist.  Eyes:     General:        Right eye: No discharge.        Left eye: No discharge.     Pupils: Pupils are equal, round, and reactive to light.  Neck:     Thyroid: No thyromegaly.  Cardiovascular:     Rate and Rhythm: Normal rate and regular rhythm.     Pulses: Intact distal pulses.     Heart sounds: Normal heart sounds. No murmur heard.   Pulmonary:     Effort: Pulmonary effort is normal. No respiratory distress.     Breath sounds: Normal breath sounds. No wheezing.  Abdominal:     General: Bowel sounds are normal. There is no distension.     Palpations: Abdomen is soft.     Tenderness: There is no abdominal tenderness.  Musculoskeletal:        General: No tenderness or edema. Normal range of motion.     Cervical back: Normal range of motion and neck supple.  Skin:    General: Skin is warm and dry.     Coloration: Skin is pale.     Findings: No erythema or rash.  Neurological:     Mental Status: He is alert and oriented to person, place, and time.     Cranial Nerves: No cranial nerve deficit.      Deep Tendon Reflexes: Reflexes are normal and symmetric.  Psychiatric:        Mood and Affect: Mood and affect normal.        Behavior: Behavior normal.        Thought Content: Thought content normal.        Judgment: Judgment normal.       BP (!) 150/75   Pulse 81   Temp (!) 95.9 F (35.5 C) (Temporal)   Ht 5\' 10"  (1.778 m)   Wt 176 lb 6.4 oz (80 kg)   BMI 25.31 kg/m      Assessment & Plan:  Jason Spencer comes in today with chief complaint of Anticoagulation   Diagnosis and orders addressed:  1. Atrial fibrillation, unspecified type (HCC) - CoaguChek XS/INR Waived  2. Atrial fibrillation with RVR (HCC)  3. Atrial flutter, unspecified type (HCC)  4. Atrial fibrillation, new onset w/ RVR  Description   INR today is 2.1 (goal 2-3) Perfect   Continue current  dose of 7.5 mg (1 1/2  tabs)  Tuesday , Thursday,  5 mg (1 tab) all other days.        Jannifer Rodney, FNP

## 2020-08-10 ENCOUNTER — Other Ambulatory Visit: Payer: Self-pay

## 2020-08-10 ENCOUNTER — Ambulatory Visit (INDEPENDENT_AMBULATORY_CARE_PROVIDER_SITE_OTHER): Payer: Medicare HMO | Admitting: Family

## 2020-08-10 ENCOUNTER — Encounter: Payer: Self-pay | Admitting: Family

## 2020-08-10 VITALS — BP 119/70 | HR 85 | Temp 97.0°F | Ht 70.0 in | Wt 175.8 lb

## 2020-08-10 DIAGNOSIS — I509 Heart failure, unspecified: Secondary | ICD-10-CM | POA: Diagnosis not present

## 2020-08-10 DIAGNOSIS — F1010 Alcohol abuse, uncomplicated: Secondary | ICD-10-CM

## 2020-08-10 DIAGNOSIS — I4891 Unspecified atrial fibrillation: Secondary | ICD-10-CM

## 2020-08-10 DIAGNOSIS — I2510 Atherosclerotic heart disease of native coronary artery without angina pectoris: Secondary | ICD-10-CM

## 2020-08-10 DIAGNOSIS — Z7901 Long term (current) use of anticoagulants: Secondary | ICD-10-CM | POA: Diagnosis not present

## 2020-08-10 DIAGNOSIS — I10 Essential (primary) hypertension: Secondary | ICD-10-CM | POA: Diagnosis not present

## 2020-08-10 DIAGNOSIS — J42 Unspecified chronic bronchitis: Secondary | ICD-10-CM | POA: Diagnosis not present

## 2020-08-10 DIAGNOSIS — I4892 Unspecified atrial flutter: Secondary | ICD-10-CM | POA: Diagnosis not present

## 2020-08-10 DIAGNOSIS — F411 Generalized anxiety disorder: Secondary | ICD-10-CM | POA: Diagnosis not present

## 2020-08-10 LAB — CMP14+EGFR
ALT: 14 IU/L (ref 0–44)
AST: 29 IU/L (ref 0–40)
Albumin/Globulin Ratio: 0.9 — ABNORMAL LOW (ref 1.2–2.2)
Albumin: 3.6 g/dL — ABNORMAL LOW (ref 3.8–4.8)
Alkaline Phosphatase: 88 IU/L (ref 44–121)
BUN/Creatinine Ratio: 6 — ABNORMAL LOW (ref 10–24)
BUN: 7 mg/dL — ABNORMAL LOW (ref 8–27)
Bilirubin Total: 0.7 mg/dL (ref 0.0–1.2)
CO2: 25 mmol/L (ref 20–29)
Calcium: 9.3 mg/dL (ref 8.6–10.2)
Chloride: 98 mmol/L (ref 96–106)
Creatinine, Ser: 1.17 mg/dL (ref 0.76–1.27)
GFR calc Af Amer: 74 mL/min/{1.73_m2} (ref >59)
GFR calc non Af Amer: 64 mL/min/{1.73_m2} (ref >59)
Globulin, Total: 3.9 g/dL (ref 1.5–4.5)
Glucose: 104 mg/dL — ABNORMAL HIGH (ref 65–99)
Potassium: 4.7 mmol/L (ref 3.5–5.2)
Sodium: 136 mmol/L (ref 134–144)
Total Protein: 7.5 g/dL (ref 6.0–8.5)

## 2020-08-10 LAB — COAGUCHEK XS/INR WAIVED
INR: 2.4 — ABNORMAL HIGH (ref 0.9–1.1)
Prothrombin Time: 28.2 s

## 2020-08-10 LAB — CBC WITH DIFFERENTIAL/PLATELET
Basophils Absolute: 0 10*3/uL (ref 0.0–0.2)
Basos: 0 %
EOS (ABSOLUTE): 0.1 10*3/uL (ref 0.0–0.4)
Eos: 1 %
Hematocrit: 38.3 % (ref 37.5–51.0)
Hemoglobin: 13.4 g/dL (ref 13.0–17.7)
Immature Grans (Abs): 0 10*3/uL (ref 0.0–0.1)
Immature Granulocytes: 1 %
Lymphocytes Absolute: 0.7 10*3/uL (ref 0.7–3.1)
Lymphs: 13 %
MCH: 34.8 pg — ABNORMAL HIGH (ref 26.6–33.0)
MCHC: 35 g/dL (ref 31.5–35.7)
MCV: 100 fL — ABNORMAL HIGH (ref 79–97)
Monocytes Absolute: 0.8 10*3/uL (ref 0.1–0.9)
Monocytes: 15 %
Neutrophils Absolute: 3.6 10*3/uL (ref 1.4–7.0)
Neutrophils: 70 %
Platelets: 286 10*3/uL (ref 150–450)
RBC: 3.85 x10E6/uL — ABNORMAL LOW (ref 4.14–5.80)
RDW: 11.6 % (ref 11.6–15.4)
WBC: 5.2 10*3/uL (ref 3.4–10.8)

## 2020-08-10 MED ORDER — WARFARIN SODIUM 5 MG PO TABS
ORAL_TABLET | ORAL | 3 refills | Status: DC
Start: 2020-08-10 — End: 2020-11-11

## 2020-08-10 NOTE — Patient Instructions (Signed)
  Cologuard  Your provider has prescribed Cologuard, an easy-to-use, noninvasive test for colon cancer screening, based on the latest advances in stool DNA science.   Here's what will happen next:  1. You may receive a call or email from [employer] to confirm your mailing address and insurance information 2. Your kit will be shipped directly to you 3. You collect your stool sample in the privacy of your own home. Please follow the instructions that come with the kit 4. You return the kit via [employer] shipping or pick-up, in the same box it arrived in 5. You should receive a call with the results once they are available. If you do not receive a call, please contact our office at [phone]  Insurance Coverage  Cologuard is covered by Medicare and most major insurers Cologuard is covered by Medicare and Medicare Advantage with no co-pay or deductible for eligible patients ages 19-85. [employer], more than 94% of Cologuard patients have no out-of-pocket cost for screening. . Based on the [place] should be covered by most private insurers with no co-pay or deductible for eligible patients (ages 13-75; at average risk for colon cancer; without symptoms). Currently, ~74% of Cologuard patients 45-49 have had no out-of-pocket cost for screening.  . Many national and regional payers have begun paying for CRC screening at 45. [employer] continues to work with payers to expand coverage and access for patients ages 38-49.   Only your healthcare insurance provider can confirm how Cologuard will be covered for you. If you have questions about coverage, you can contact your insurance company directly or ask the specialists at [employer] to do that for you. A Customer Support Specialist can be reached at [phone].    Patient Support Screening for colon cancer is very important to your good health, so if you have any questions at all, please call [employer], commercial Customer Support Specialists at [phone]. They are available 24 hours a day, 6 days a week. An instructional video is available to view online at [email or web address]   *Information and graphics obtained from [email or web address]

## 2020-08-10 NOTE — Addendum Note (Signed)
Addended by: [name] on: 08/10/2020 10:47 AM   Modules accepted: Orders

## 2020-08-10 NOTE — Progress Notes (Signed)
Subjective:    Patient ID: Jason Spencer, male    DOB: 1950/08/24, 70 y.o.   MRN: [id number]  Chief Complaint  Patient presents with  . Anticoagulation   Pt presents to the office today for INR check and chronic follow up. He is currently taking warfarin for A Fib. See anticoagulation flow sheet.Denies any bleeding, changes in diet, or bruising.  His INR today is 2.4. He is currently taking 7.5 mg (1 1/2 tabs)  Tuesday , Thursday, and 5 mg (1 tab) all other days.   He does have a hx of alcohol abuse and admits to drinking 4-5  larger beers a day. He is followed by Cardiologists for A Fib, CAD, and Cardiomyopathy.  Hypertension This is a chronic problem. The current episode started more than 1 year ago. The problem has been resolved since onset. The problem is controlled. Associated symptoms include anxiety and malaise/fatigue. Pertinent negatives include no peripheral edema or shortness of breath. Risk factors for coronary artery disease include dyslipidemia, obesity, male gender and sedentary lifestyle. The current treatment provides moderate improvement. Hypertensive end-organ damage includes kidney disease.  Congestive Heart Failure Presents for follow-up visit. Associated symptoms include fatigue. Pertinent negatives include no claudication, edema or shortness of breath. The symptoms have been stable.  Nicotine Dependence Presents for follow-up visit. Symptoms include fatigue and irritability. His urge triggers include company of smokers. The symptoms have been stable. He smokes < 1/2 a pack of cigarettes per day.  Hyperlipidemia This is a chronic problem. The current episode started more than 1 year ago. The problem is controlled. Exacerbating diseases include obesity. Pertinent negatives include no shortness of breath. Current antihyperlipidemic treatment includes statins. The current treatment provides moderate improvement of lipids. Risk factors for coronary artery disease  include dyslipidemia, male sex, hypertension and a sedentary lifestyle.  Anxiety Presents for follow-up visit. Symptoms include depressed mood, irritability and nervous/anxious behavior. Patient reports no shortness of breath. Symptoms occur most days. The severity of symptoms is moderate. The quality of sleep is good.    COPD Using the Trelegy daily. Continues to smoke 1/2 pack a day.     Review of Systems  Constitutional: Positive for fatigue, irritability and malaise/fatigue.  Respiratory: Negative for shortness of breath.   Cardiovascular: Negative for claudication.  Psychiatric/Behavioral: The patient is nervous/anxious.   All other systems reviewed and are negative.      Objective:   Physical Exam Vitals reviewed.  Constitutional:      General: He is not in acute distress.    Appearance: He is well-developed and well-nourished.  HENT:     Head: Normocephalic.     Right Ear: Tympanic membrane normal.     Left Ear: Tympanic membrane normal.     Mouth/Throat:     Mouth: Oropharynx is clear and moist.  Eyes:     General:        Right eye: No discharge.        Left eye: No discharge.     Pupils: Pupils are equal, round, and reactive to light.  Neck:     Thyroid: No thyromegaly.  Cardiovascular:     Rate and Rhythm: Normal rate and regular rhythm.     Pulses: Intact distal pulses.     Heart sounds: Normal heart sounds. No murmur heard.   Pulmonary:     Effort: Pulmonary effort is normal. No respiratory distress.     Breath sounds: Normal breath sounds. No wheezing.  Abdominal:  General: Bowel sounds are normal. There is no distension.     Palpations: Abdomen is soft.     Tenderness: There is no abdominal tenderness.  Musculoskeletal:        General: No tenderness or edema. Normal range of motion.     Cervical back: Normal range of motion and neck supple.  Skin:    General: Skin is warm and dry.     Coloration: Skin is pale.     Findings: No erythema or  rash.  Neurological:     Mental Status: He is alert and oriented to person, place, and time.     Cranial Nerves: No cranial nerve deficit.     Deep Tendon Reflexes: Reflexes are normal and symmetric.  Psychiatric:        Mood and Affect: Mood and affect normal.        Behavior: Behavior normal.        Thought Content: Thought content normal.        Judgment: Judgment normal.       BP 119/70   Pulse 85   Temp (!) 97 F (36.1 C) (Temporal)   Ht 5' 10"  (1.778 m)   Wt 175 lb 12.8 oz (79.7 kg)   BMI 25.22 kg/m      Assessment & Plan:  Tae Vonada comes in today with chief complaint of Anticoagulation   Diagnosis and orders addressed:  1. Atrial fibrillation, unspecified type ([place])  - CoaguChek XS/INR Waived - CMP14+EGFR - CBC with Differential/Platelet  2. Atrial fibrillation with RVR (HCC)  - CMP14+EGFR - CBC with Differential/Platelet  3. Atrial flutter, unspecified type ([place]) - CMP14+EGFR - CBC with Differential/Platelet  4. Coronary artery disease involving native coronary artery of native heart without angina pectoris - CMP14+EGFR - CBC with Differential/Platelet  5. Essential hypertension, benign - CMP14+EGFR - CBC with Differential/Platelet  6. Chronic bronchitis, unspecified chronic bronchitis type ([place]) - CMP14+EGFR - CBC with Differential/Platelet  7. Alcohol abuse - CMP14+EGFR - CBC with Differential/Platelet  8. Current use of anticoagulant therapy - CMP14+EGFR - CBC with Differential/Platelet  9. GAD (generalized anxiety disorder) - CMP14+EGFR - CBC with Differential/Platelet   Labs pending Health Maintenance reviewed Diet and exercise encouraged  Follow up plan: 1 months    [name], FNP

## 2020-09-02 ENCOUNTER — Encounter: Payer: Self-pay | Admitting: Family

## 2020-09-02 ENCOUNTER — Other Ambulatory Visit: Payer: Self-pay

## 2020-09-02 ENCOUNTER — Ambulatory Visit (INDEPENDENT_AMBULATORY_CARE_PROVIDER_SITE_OTHER): Payer: Medicare HMO | Admitting: Family

## 2020-09-02 VITALS — BP 141/79 | Temp 96.2°F | Ht 70.0 in | Wt 176.4 lb

## 2020-09-02 DIAGNOSIS — M170 Bilateral primary osteoarthritis of knee: Secondary | ICD-10-CM

## 2020-09-02 MED ORDER — METHYLPREDNISOLONE ACETATE 80 MG/ML IJ SUSP: 80.0000 mg | Freq: Once | INTRAMUSCULAR | Status: DC

## 2020-09-02 NOTE — Progress Notes (Signed)
Subjective:    Patient ID: Jason Spencer, male    DOB: 1950-12-06, 71 y.o.   MRN: [id number]  Chief Complaint  Patient presents with   Knee Pain   Pt presents to the office today with bilateral knee pain and weakness.  Knee Pain  The incident occurred more than 1 week ago. There was no injury mechanism. The pain is present in the right knee and left knee. The quality of the pain is described as aching. The pain is at a severity of 2/10. The pain is mild. The pain has been intermittent since onset. Associated symptoms include muscle weakness. Pertinent negatives include no loss of motion, loss of sensation, numbness or tingling. He reports no foreign bodies present. The symptoms are aggravated by movement and weight bearing. He has tried rest and NSAIDs for the symptoms. The treatment provided mild relief.      Review of Systems  Neurological: Negative for tingling and numbness.  All other systems reviewed and are negative.      Objective:   Physical Exam Vitals reviewed.  Constitutional:      General: He is not in acute distress.    Appearance: He is well-developed and well-nourished.  HENT:     Head: Normocephalic.     Mouth/Throat:     Mouth: Oropharynx is clear and moist.  Eyes:     General:        Right eye: No discharge.        Left eye: No discharge.     Pupils: Pupils are equal, round, and reactive to light.  Neck:     Thyroid: No thyromegaly.  Cardiovascular:     Rate and Rhythm: Normal rate and regular rhythm.     Pulses: Intact distal pulses.     Heart sounds: Normal heart sounds. No murmur heard.   Pulmonary:     Effort: Pulmonary effort is normal. No respiratory distress.     Breath sounds: Normal breath sounds. No wheezing.  Musculoskeletal:        General: Tenderness present. No edema.     Cervical back: Normal range of motion and neck supple.     Comments: Bilateral pain in medial knees with flexion and extension  Skin:    General: Skin is warm  and dry.     Findings: No erythema or rash.  Neurological:     Mental Status: He is alert and oriented to person, place, and time.     Cranial Nerves: No cranial nerve deficit.     Deep Tendon Reflexes: Reflexes are normal and symmetric.  Psychiatric:        Mood and Affect: Mood and affect normal.        Behavior: Behavior normal.        Thought Content: Thought content normal.        Judgment: Judgment normal.    rightknee prepped with betadine Injected with Marcaine .5% plain and methylprednisolone with 22 guage needle x 1. Patient tolerated well.  leftknee prepped with betadine Injected with Marcaine .5% plain and methylprednisolone with 22 guage needle x 1. Patient tolerated well.   BP (!) 141/79    Temp (!) 96.2 F (35.7 C) (Temporal)    Ht 5\' 10"  (1.778 m)    Wt 176 lb 6.4 oz (80 kg)    BMI 25.31 kg/m        Assessment & Plan:  Jason Spencer comes in today with chief complaint of Knee Pain   Diagnosis and  orders addressed:  1. Primary osteoarthritis of both knees -Rest ROM exercises  Ice Keep chronic follow up  - methylPREDNISolone acetate (DEPO-MEDROL) injection 80 mg - methylPREDNISolone acetate (DEPO-MEDROL) injection 80 mg   Jannifer Rodney, FNP

## 2020-09-02 NOTE — Patient Instructions (Signed)
Osteoarthritis  Osteoarthritis is a type of arthritis. It refers to joint pain or joint disease. Osteoarthritis affects tissue that covers the ends of bones in joints (cartilage). Cartilage acts as a cushion between the bones and helps them move smoothly. Osteoarthritis occurs when cartilage in the joints gets worn down. Osteoarthritis is sometimes called "wear and tear" arthritis. Osteoarthritis is the most common form of arthritis. It often occurs in older people. It is a condition that gets worse over time. The joints most often affected by this condition are in the fingers, toes, hips, knees, and spine, including the neck and lower back. What are the causes? This condition is caused by the wearing down of cartilage that covers the ends of bones. What increases the risk? The following factors may make you more likely to develop this condition:  Being age 50 or older.  Obesity.  Overuse of joints.  Past injury of a joint.  Past surgery on a joint.  Family history of osteoarthritis. What are the signs or symptoms? The main symptoms of this condition are pain, swelling, and stiffness in the joint. Other symptoms may include:  An enlarged joint.  More pain and further damage caused by small pieces of bone or cartilage that break off and float inside of the joint.  Small deposits of bone (osteophytes) that grow on the edges of the joint.  A grating or scraping feeling inside the joint when you move it.  Popping or creaking sounds when you move.  Difficulty walking or exercising.  An inability to grip items, twist your hand(s), or control the movements of your hands and fingers. How is this diagnosed? This condition may be diagnosed based on:  Your medical history.  A physical exam.  Your symptoms.  X-rays of the affected joint(s).  Blood tests to rule out other types of arthritis. How is this treated? There is no cure for this condition, but treatment can help control  pain and improve joint function. Treatment may include a combination of therapies, such as:  Pain relief techniques, such as: ? Applying heat and cold to the joint. ? Massage. ? A form of talk therapy called cognitive behavioral therapy (CBT). This therapy helps you set goals and follow up on the changes that you make.  Medicines for pain and inflammation. The medicines can be taken by mouth or applied to the skin. They include: ? NSAIDs, such as ibuprofen. ? Prescription medicines. ? Strong anti-inflammatory medicines (corticosteroids). ? Certain nutritional supplements.  A prescribed exercise program. You may work with a physical therapist.  Assistive devices, such as a brace, wrap, splint, specialized glove, or cane.  A weight control plan.  Surgery, such as: ? An osteotomy. This is done to reposition the bones and relieve pain or to remove loose pieces of bone and cartilage. ? Joint replacement surgery. You may need this surgery if you have advanced osteoarthritis. Follow these instructions at home: Activity  Rest your affected joints as told by your health care provider.  Exercise as told by your health care provider. He or she may recommend specific types of exercise, such as: ? Strengthening exercises. These are done to strengthen the muscles that support joints affected by arthritis. ? Aerobic activities. These are exercises, such as brisk walking or water aerobics, that increase your heart rate. ? Range-of-motion activities. These help your joints move more easily. ? Balance and agility exercises. Managing pain, stiffness, and swelling  If directed, apply heat to the affected area as often   as told by your health care provider. Use the heat source that your health care provider recommends, such as a moist heat pack or a heating pad. ? If you have a removable assistive device, remove it as told by your health care provider. ? Place a towel between your skin and the heat  source. If your health care provider tells you to keep the assistive device on while you apply heat, place a towel between the assistive device and the heat source. ? Leave the heat on for 20-30 minutes. ? Remove the heat if your skin turns bright red. This is especially important if you are unable to feel pain, heat, or cold. You may have a greater risk of getting burned.  If directed, put ice on the affected area. To do this: ? If you have a removable assistive device, remove it as told by your health care provider. ? Put ice in a plastic bag. ? Place a towel between your skin and the bag. If your health care provider tells you to keep the assistive device on during icing, place a towel between the assistive device and the bag. ? Leave the ice on for 20 minutes, 2-3 times a day. ? Move your fingers or toes often to reduce stiffness and swelling. ? Raise (elevate) the injured area above the level of your heart while you are sitting or lying down.      General instructions  Take over-the-counter and prescription medicines only as told by your health care provider.  Maintain a healthy weight. Follow instructions from your health care provider for weight control.  Do not use any products that contain nicotine or tobacco, such as cigarettes, e-cigarettes, and chewing tobacco. If you need help quitting, ask your health care provider.  Use assistive devices as told by your health care provider.  Keep all follow-up visits as told by your health care provider. This is important. Where to find more information  National Institute of Arthritis and Musculoskeletal and Skin Diseases: www.niams.nih.gov  National Institute on Aging: www.nia.nih.gov  American College of Rheumatology: www.rheumatology.org Contact a health care provider if:  You have redness, swelling, or a feeling of warmth in a joint that gets worse.  You have a fever along with joint or muscle aches.  You develop a  rash.  You have trouble doing your normal activities. Get help right away if:  You have pain that gets worse and is not relieved by pain medicine. Summary  Osteoarthritis is a type of arthritis that affects tissue covering the ends of bones in joints (cartilage).  This condition is caused by the wearing down of cartilage that covers the ends of bones.  The main symptom of this condition is pain, swelling, and stiffness in the joint.  There is no cure for this condition, but treatment can help control pain and improve joint function. This information is not intended to replace advice given to you by your health care provider. Make sure you discuss any questions you have with your health care provider. Document Revised: 06/23/2019 Document Reviewed: 06/23/2019 Elsevier Patient Education  2021 Elsevier Inc.  

## 2020-09-03 ENCOUNTER — Other Ambulatory Visit: Payer: Self-pay | Admitting: Family

## 2020-09-10 ENCOUNTER — Ambulatory Visit (INDEPENDENT_AMBULATORY_CARE_PROVIDER_SITE_OTHER): Payer: Medicare HMO | Admitting: Family

## 2020-09-10 ENCOUNTER — Encounter: Payer: Self-pay | Admitting: Family

## 2020-09-10 ENCOUNTER — Other Ambulatory Visit: Payer: Self-pay

## 2020-09-10 VITALS — BP 110/63 | HR 76 | Temp 97.0°F | Ht 70.0 in | Wt 172.4 lb

## 2020-09-10 DIAGNOSIS — I4891 Unspecified atrial fibrillation: Secondary | ICD-10-CM

## 2020-09-10 DIAGNOSIS — M170 Bilateral primary osteoarthritis of knee: Secondary | ICD-10-CM

## 2020-09-10 DIAGNOSIS — Z7901 Long term (current) use of anticoagulants: Secondary | ICD-10-CM | POA: Diagnosis not present

## 2020-09-10 LAB — COAGUCHEK XS/INR WAIVED
INR: 2.9 — ABNORMAL HIGH (ref 0.9–1.1)
Prothrombin Time: 35.2 s

## 2020-09-10 NOTE — Patient Instructions (Signed)
Description   INR today is 2.9(goal 2-3) Perfect   Continue current  dose of 7.5 mg (1 1/2 tabs)  Tuesday , Thursday,  5 mg (1 tab) all other days.

## 2020-09-10 NOTE — Progress Notes (Signed)
Subjective:    Patient ID: Jason Spencer, male    DOB: 12-16-1950, 70 y.o.   MRN: [id number]  Chief Complaint  Patient presents with  . Anticoagulation    HPI Pt presents to the office today for INR check and chronic follow up. He is currently taking warfarin for A Fib. See anticoagulation flow sheet.Denies any bleeding, changes in diet, missed doses, or bruising.  His INR today is 2.9. He is currently taking7.5 mg (1 1/2 tabs) Tuesday , Thursday, and5 mg (1 tab) all other days.  He reports since getting steroid injection in bilateral knees, his pain is greatly improved. States this is doing well.   Review of Systems  All other systems reviewed and are negative.      Objective:   Physical Exam Vitals reviewed.  Constitutional:      General: He is not in acute distress.    Appearance: He is well-developed and well-nourished.  HENT:     Head: Normocephalic.     Right Ear: Tympanic membrane normal.     Left Ear: Tympanic membrane normal.     Mouth/Throat:     Mouth: Oropharynx is clear and moist.  Eyes:     General:        Right eye: No discharge.        Left eye: No discharge.     Pupils: Pupils are equal, round, and reactive to light.  Neck:     Thyroid: No thyromegaly.  Cardiovascular:     Rate and Rhythm: Normal rate and regular rhythm.     Pulses: Intact distal pulses.     Heart sounds: Normal heart sounds. No murmur heard.   Pulmonary:     Effort: Pulmonary effort is normal. No respiratory distress.     Breath sounds: Decreased air movement present. Wheezing present.  Abdominal:     General: Bowel sounds are normal. There is no distension.     Palpations: Abdomen is soft.     Tenderness: There is no abdominal tenderness.  Musculoskeletal:        General: No tenderness or edema. Normal range of motion.     Cervical back: Normal range of motion and neck supple.  Skin:    General: Skin is warm and dry.     Findings: No erythema or rash.   Neurological:     Mental Status: He is alert and oriented to person, place, and time.     Cranial Nerves: No cranial nerve deficit.     Deep Tendon Reflexes: Reflexes are normal and symmetric.  Psychiatric:        Mood and Affect: Mood and affect normal.        Behavior: Behavior normal.        Thought Content: Thought content normal.        Judgment: Judgment normal.       BP 110/63   Pulse 76   Temp (!) 97 F (36.1 C) (Temporal)   Ht 5\' 10"  (1.778 m)   Wt 172 lb 6.4 oz (78.2 kg)   SpO2 98%   BMI 24.74 kg/m      Assessment & Plan:  Jason Spencer comes in today with chief complaint of Anticoagulation   Diagnosis and orders addressed:  1. Atrial fibrillation, unspecified type (HCC) - CoaguChek XS/INR Waived  2. Atrial fibrillation with RVR (HCC)   3. Current use of anticoagulant therapy  Description   INR today is 2.9(goal 2-3) Perfect   Continue current  dose  of 7.5 mg (1 1/2 tabs)  Tuesday , Thursday,  5 mg (1 tab) all other days.     RTO in 1 month   4. Primary osteoarthritis of both knees    Jannifer Rodney, FNP

## 2020-10-11 ENCOUNTER — Ambulatory Visit (INDEPENDENT_AMBULATORY_CARE_PROVIDER_SITE_OTHER): Payer: Medicare HMO | Admitting: Family

## 2020-10-11 ENCOUNTER — Other Ambulatory Visit: Payer: Self-pay

## 2020-10-11 ENCOUNTER — Encounter: Payer: Self-pay | Admitting: Family

## 2020-10-11 VITALS — BP 115/67 | HR 90 | Temp 97.4°F | Ht 70.0 in | Wt 169.4 lb

## 2020-10-11 DIAGNOSIS — F411 Generalized anxiety disorder: Secondary | ICD-10-CM

## 2020-10-11 DIAGNOSIS — I2510 Atherosclerotic heart disease of native coronary artery without angina pectoris: Secondary | ICD-10-CM

## 2020-10-11 DIAGNOSIS — Z720 Tobacco use: Secondary | ICD-10-CM | POA: Diagnosis not present

## 2020-10-11 DIAGNOSIS — I4891 Unspecified atrial fibrillation: Secondary | ICD-10-CM

## 2020-10-11 DIAGNOSIS — I10 Essential (primary) hypertension: Secondary | ICD-10-CM

## 2020-10-11 DIAGNOSIS — E785 Hyperlipidemia, unspecified: Secondary | ICD-10-CM

## 2020-10-11 DIAGNOSIS — I429 Cardiomyopathy, unspecified: Secondary | ICD-10-CM | POA: Diagnosis not present

## 2020-10-11 DIAGNOSIS — Z7901 Long term (current) use of anticoagulants: Secondary | ICD-10-CM

## 2020-10-11 DIAGNOSIS — F1010 Alcohol abuse, uncomplicated: Secondary | ICD-10-CM

## 2020-10-11 DIAGNOSIS — J42 Unspecified chronic bronchitis: Secondary | ICD-10-CM | POA: Diagnosis not present

## 2020-10-11 LAB — COAGUCHEK XS/INR WAIVED
INR: 2.3 — ABNORMAL HIGH (ref 0.9–1.1)
Prothrombin Time: 27 s

## 2020-10-11 NOTE — Patient Instructions (Signed)
Pneumococcal Polysaccharide Vaccine (PPSV23): What You Need to Know 1. Why get vaccinated? Pneumococcal polysaccharide vaccine (PPSV23) can prevent pneumococcal disease. Pneumococcal disease refers to any illness caused by pneumococcal bacteria. These bacteria can cause many types of illnesses, including pneumonia, which is an infection of the lungs. Pneumococcal bacteria are one of the most common causes of pneumonia. Besides pneumonia, pneumococcal bacteria can also cause:  Ear infections  Sinus infections  Meningitis (infection of the tissue covering the brain and spinal cord)  Bacteremia (bloodstream infection) Anyone can get pneumococcal disease, but children under 2 years of age, people with certain medical conditions, adults 65 years or older, and cigarette smokers are at the highest risk. Most pneumococcal infections are mild. However, some can result in long-term problems, such as brain damage or hearing loss. Meningitis, bacteremia, and pneumonia caused by pneumococcal disease can be fatal. 2. PPSV23 PPSV23 protects against 23 types of bacteria that cause pneumococcal disease. PPSV23 is recommended for:  All adults 65 years or older,  Anyone 2 years or older with certain medical conditions that can lead to an increased risk for pneumococcal disease. Most people need only one dose of PPSV23. A second dose of PPSV23, and another type of pneumococcal vaccine called PCV13, are recommended for certain high-risk groups. Your health care provider can give you more information. People 65 years or older should get a dose of PPSV23 even if they have already gotten one or more doses of the vaccine before they turned 65. 3. Talk with your health care provider Tell your vaccine provider if the person getting the vaccine:  Has had an allergic reaction after a previous dose of PPSV23, or has any severe, life-threatening allergies. In some cases, your health care provider may decide to postpone  PPSV23 vaccination to a future visit. People with minor illnesses, such as a cold, may be vaccinated. People who are moderately or severely ill should usually wait until they recover before getting PPSV23. Your health care provider can give you more information. 4. Risks of a vaccine reaction  Redness or pain where the shot is given, feeling tired, fever, or muscle aches can happen after PPSV23. People sometimes faint after medical procedures, including vaccination. Tell your provider if you feel dizzy or have vision changes or ringing in the ears. As with any medicine, there is a very remote chance of a vaccine causing a severe allergic reaction, other serious injury, or death. 5. What if there is a serious problem? An allergic reaction could occur after the vaccinated person leaves the clinic. If you see signs of a severe allergic reaction (hives, swelling of the face and throat, difficulty breathing, a fast heartbeat, dizziness, or weakness), call 9-1-1 and get the person to the nearest hospital. For other signs that concern you, call your health care provider. Adverse reactions should be reported to the Vaccine Adverse Event Reporting System (VAERS). Your health care provider will usually file this report, or you can do it yourself. Visit the VAERS website at www.vaers.hhs.gov or call 1-800-822-7967. VAERS is only for reporting reactions, and VAERS staff do not give medical advice. 6. How can I learn more?  Ask your health care provider.  Call your local or state health department.  Contact the Centers for Disease Control and Prevention (CDC): ? Call 1-800-232-4636 (1-800-CDC-INFO) or ? Visit CDC's website at www.cdc.gov/vaccines Vaccine Information Statement PPSV23 Vaccine (05/08/2018) This information is not intended to replace advice given to you by your health care provider. Make sure you discuss   any questions you have with your health care provider. Document Revised: 02/27/2020  Document Reviewed: 02/27/2020 Elsevier Patient Education  2021 Elsevier Inc.   

## 2020-10-11 NOTE — Progress Notes (Signed)
Subjective:    Patient ID: Jason Spencer, male    DOB: 04/16/1951, 70 y.o.   MRN: [id number]  Chief Complaint  Patient presents with  . Atrial Fibrillation  . Coagulation Disorder    Pt presents to the office today for INR check and chronic follow up. He is currently taking warfarin for A Fib. See anticoagulation flow sheet.Denies any bleeding, changes in diet, or bruising.  His INR today is 2.3. He is currently taking7.5 mg (1 1/2 tabs) Tuesday , Thursday, and5 mg (1 tab) all other days.  He does have a hx of alcohol abuse and admits to drinking 4-5  larger beers a day. He is followed by Cardiologists for A Fib, CAD, and Cardiomyopathy. Atrial Fibrillation Presents for follow-up visit. Symptoms include hypertension and shortness of breath. Symptoms are negative for dizziness and pacemaker problem. Past medical history includes atrial fibrillation, CHF and hyperlipidemia.  Hypertension This is a chronic problem. The current episode started more than 1 year ago. The problem has been resolved since onset. Associated symptoms include anxiety and shortness of breath. Pertinent negatives include no malaise/fatigue or peripheral edema. Risk factors for coronary artery disease include male gender, sedentary lifestyle and smoking/tobacco exposure. The current treatment provides moderate improvement.  Congestive Heart Failure Presents for follow-up visit. Associated symptoms include shortness of breath. Pertinent negatives include no edema or fatigue. The symptoms have been stable.  Nicotine Dependence Presents for follow-up visit. Symptoms include irritability. Symptoms are negative for fatigue. His urge triggers include company of smokers. The symptoms have been stable. He smokes < 1/2 a pack of cigarettes per day.  Hyperlipidemia This is a chronic problem. The current episode started more than 1 year ago. Factors aggravating his hyperlipidemia include smoking. Associated symptoms  include shortness of breath. The current treatment provides moderate improvement of lipids. Risk factors for coronary artery disease include dyslipidemia, male sex and hypertension.  Anxiety Presents for follow-up visit. Symptoms include depressed mood, excessive worry, irritability, nervous/anxious behavior, restlessness and shortness of breath. Patient reports no dizziness. Symptoms occur most days. The severity of symptoms is moderate.        Review of Systems  Constitutional: Positive for irritability. Negative for fatigue and malaise/fatigue.  Respiratory: Positive for shortness of breath.   Neurological: Negative for dizziness.  Psychiatric/Behavioral: The patient is nervous/anxious.   All other systems reviewed and are negative.      Objective:   Physical Exam Vitals reviewed.  Constitutional:      General: He is not in acute distress.    Appearance: He is well-developed.  HENT:     Head: Normocephalic.     Right Ear: Tympanic membrane normal.     Left Ear: Tympanic membrane normal.  Eyes:     General:        Right eye: No discharge.        Left eye: No discharge.     Pupils: Pupils are equal, round, and reactive to light.  Neck:     Thyroid: No thyromegaly.  Cardiovascular:     Rate and Rhythm: Normal rate and regular rhythm.     Heart sounds: Normal heart sounds. No murmur heard.   Pulmonary:     Effort: Pulmonary effort is normal. No respiratory distress.     Breath sounds: Rhonchi present. No wheezing.  Abdominal:     General: Bowel sounds are normal. There is no distension.     Palpations: Abdomen is soft.     Tenderness: There is no  abdominal tenderness.  Musculoskeletal:        General: No tenderness. Normal range of motion.     Cervical back: Normal range of motion and neck supple.  Skin:    General: Skin is warm and dry.     Findings: No erythema or rash.  Neurological:     Mental Status: He is alert and oriented to person, place, and time.      Cranial Nerves: No cranial nerve deficit.     Deep Tendon Reflexes: Reflexes are normal and symmetric.  Psychiatric:        Behavior: Behavior normal.        Thought Content: Thought content normal.        Judgment: Judgment normal.       BP 115/67   Pulse 90   Temp (!) 97.4 F (36.3 C)   Ht 5\' 10"  (1.778 m)   Wt 169 lb 6.4 oz (76.8 kg)   SpO2 97%   BMI 24.31 kg/m      Assessment & Plan:  Jason Spencer comes in today with chief complaint of Atrial Fibrillation and Coagulation Disorder   Diagnosis and orders addressed:  1. Atrial fibrillation, unspecified type (HCC) - CoaguChek XS/INR Waived - Description   INR today is 2.3(goal 2-3) Perfect   Continue current  dose of 7.5 mg (1 1/2 tabs)  Tuesday , Thursday,  5 mg (1 tab) all other days.       2. Atrial fibrillation with RVR (HCC)  3. Coronary artery disease involving native coronary artery of native heart without angina pectoris  4. Essential hypertension, benign  5. Cardiomyopathy, unspecified type (HCC)  6. Chronic bronchitis, unspecified chronic bronchitis type (HCC)  7. Tobacco abuse  8. Hyperlipidemia, unspecified hyperlipidemia type  9. Current use of anticoagulant therapy   10. GAD (generalized anxiety disorder)  11. Alcohol abuse    Labs pending Health Maintenance reviewed Diet and exercise encouraged  Follow up plan:  1 month   Tuesday, FNP

## 2020-11-11 ENCOUNTER — Encounter: Payer: Self-pay | Admitting: Family

## 2020-11-11 ENCOUNTER — Ambulatory Visit (INDEPENDENT_AMBULATORY_CARE_PROVIDER_SITE_OTHER): Payer: Medicare HMO | Admitting: Family

## 2020-11-11 ENCOUNTER — Other Ambulatory Visit: Payer: Self-pay

## 2020-11-11 VITALS — BP 119/60 | HR 76 | Temp 97.9°F | Ht 70.0 in | Wt 167.2 lb

## 2020-11-11 DIAGNOSIS — I4892 Unspecified atrial flutter: Secondary | ICD-10-CM | POA: Diagnosis not present

## 2020-11-11 DIAGNOSIS — Z7901 Long term (current) use of anticoagulants: Secondary | ICD-10-CM

## 2020-11-11 DIAGNOSIS — I4891 Unspecified atrial fibrillation: Secondary | ICD-10-CM

## 2020-11-11 DIAGNOSIS — I2510 Atherosclerotic heart disease of native coronary artery without angina pectoris: Secondary | ICD-10-CM

## 2020-11-11 DIAGNOSIS — Z720 Tobacco use: Secondary | ICD-10-CM | POA: Diagnosis not present

## 2020-11-11 LAB — COAGUCHEK XS/INR WAIVED
INR: 1.8 — ABNORMAL HIGH (ref 0.9–1.1)
Prothrombin Time: 21.7 s

## 2020-11-11 MED ORDER — WARFARIN SODIUM 5 MG PO TABS
ORAL_TABLET | ORAL | 3 refills | Status: DC
Start: 2020-11-11 — End: 2020-12-31

## 2020-11-11 NOTE — Patient Instructions (Addendum)
Description   INR today is 2.3(goal 2-3) Perfect   Tomorrow 11/12/20 take 7.5 mg, then Continue current  dose of 7.5 mg (1 1/2 tabs)  Tuesday , Thursday,  5 mg (1 tab) all other days.

## 2020-11-11 NOTE — Progress Notes (Signed)
Subjective:    Patient ID: Jason Spencer, male    DOB: 04-Dec-1950, 70 y.o.   MRN: [id number]  Chief Complaint  Patient presents with  . Follow-up  . Atrial Fibrillation    HPI Pt presents to the office today for INR checkand chronic follow up. He is currently taking warfarin for A Fib. See anticoagulation flow sheet.Denies any bleeding, changes in diet. States he has bruising.  His INR today is1.8.. He is currently taking7.5 mg (1 1/2 tabs) Tuesday , Thursday, and5 mg (1 tab) all other days.  He does have a hx of alcohol abuse and admits to drinking4-5larger beers a day. He is followed by Cardiologists for A Fib, CAD, and Cardiomyopathy.   Review of Systems  All other systems reviewed and are negative.      Objective:   Physical Exam Vitals reviewed.  Constitutional:      General: He is not in acute distress.    Appearance: He is well-developed.  HENT:     Head: Normocephalic.     Right Ear: Tympanic membrane normal.     Left Ear: Tympanic membrane normal.  Eyes:     General:        Right eye: No discharge.        Left eye: No discharge.     Pupils: Pupils are equal, round, and reactive to light.  Neck:     Thyroid: No thyromegaly.  Cardiovascular:     Rate and Rhythm: Normal rate and regular rhythm.     Heart sounds: Normal heart sounds. No murmur heard.   Pulmonary:     Effort: Pulmonary effort is normal. No respiratory distress.     Breath sounds: Normal breath sounds. No wheezing.  Abdominal:     General: Bowel sounds are normal. There is no distension.     Palpations: Abdomen is soft.     Tenderness: There is no abdominal tenderness.  Musculoskeletal:        General: No tenderness. Normal range of motion.     Cervical back: Normal range of motion and neck supple.  Skin:    General: Skin is warm and dry.     Findings: Bruising present. No erythema or rash.  Neurological:     Mental Status: He is alert and oriented to person, place, and  time.     Cranial Nerves: No cranial nerve deficit.     Deep Tendon Reflexes: Reflexes are normal and symmetric.  Psychiatric:        Behavior: Behavior normal.        Thought Content: Thought content normal.        Judgment: Judgment normal.     BP 119/60   Pulse 76   Temp 97.9 F (36.6 C)   Ht 5\' 10"  (1.778 m)   Wt 167 lb 3.2 oz (75.8 kg)   SpO2 98%   BMI 23.99 kg/m        Assessment & Plan:  Jason Spencer comes in today with chief complaint of Follow-up and Atrial Fibrillation   Diagnosis and orders addressed:  1. Atrial fibrillation with RVR (HCC) - CoaguChek XS/INR Waived - warfarin (COUMADIN) 5 MG tablet; TAKE (1) TABLET AS DIR- ECTED BY [facility]  Dispense: 50 tablet; Refill: 3  2. Atrial flutter, unspecified type (HCC)  3. Current use of anticoagulant therapy  4. Tobacco abuse  5. Atrial fibrillation, unspecified type (HCC) - warfarin (COUMADIN) 5 MG tablet; TAKE (1) TABLET AS DIR- ECTED BY [facility]  Dispense: 50 tablet; Refill: 3  6. Coronary artery disease involving native coronary artery of native heart without angina pectoris - warfarin (COUMADIN) 5 MG tablet; TAKE (1) TABLET AS DIR- ECTED BY [facility]  Dispense: 50 tablet; Refill: 3  7. Atrial fibrillation, new onset (HCC) - warfarin (COUMADIN) 5 MG tablet; TAKE (1) TABLET AS DIR- ECTED BY [facility]IN CLINIC  Dispense: 50 tablet; Refill: 3  8. Atrial fibrillation, new onset w/ RVR - warfarin (COUMADIN) 5 MG tablet; TAKE (1) TABLET AS DIR- ECTED BY [facility]  Dispense: 50 tablet; Refill: 3   Description   INR today is 1.8(goal 2-3) too thick   Tomorrow 11/12/20 take 7.5 mg, then Continue current  dose of 7.5 mg (1 1/2 tabs)  Tuesday , Thursday,  5 mg (1 tab) all other days.       Follow up plan: 2 weeks    Jannifer Rodney, FNP

## 2020-11-30 ENCOUNTER — Other Ambulatory Visit: Payer: Self-pay

## 2020-11-30 ENCOUNTER — Ambulatory Visit (INDEPENDENT_AMBULATORY_CARE_PROVIDER_SITE_OTHER): Payer: Medicare HMO | Admitting: Family

## 2020-11-30 ENCOUNTER — Encounter: Payer: Self-pay | Admitting: Family

## 2020-11-30 VITALS — BP 132/70 | HR 78 | Temp 97.0°F | Ht 70.0 in | Wt 164.6 lb

## 2020-11-30 DIAGNOSIS — Z7901 Long term (current) use of anticoagulants: Secondary | ICD-10-CM | POA: Diagnosis not present

## 2020-11-30 DIAGNOSIS — I10 Essential (primary) hypertension: Secondary | ICD-10-CM | POA: Diagnosis not present

## 2020-11-30 DIAGNOSIS — Z720 Tobacco use: Secondary | ICD-10-CM | POA: Diagnosis not present

## 2020-11-30 DIAGNOSIS — I4891 Unspecified atrial fibrillation: Secondary | ICD-10-CM

## 2020-11-30 LAB — COAGUCHEK XS/INR WAIVED
INR: 2.2 — ABNORMAL HIGH (ref 0.9–1.1)
Prothrombin Time: 26.5 s

## 2020-11-30 MED ORDER — LISINOPRIL 40 MG PO TABS: 40.0000 mg | ORAL_TABLET | Freq: Every day | ORAL | 3 refills | Status: DC

## 2020-11-30 NOTE — Progress Notes (Signed)
Subjective:    Patient ID: Jason Spencer, male    DOB: August 05, 1950, 70 y.o.   MRN: [id number]  Chief Complaint  Patient presents with  . Coagulation Disorder  . Anticoagulation   Pt presents to the office today for INR check.  He is currently taking warfarin for A Fib. See anticoagulation flow sheet.Denies any bleeding, changes in diet. States he has bruising.  His INR today is2.2. He is currently taking7.5 mg (1 1/2 tabs) Tuesday , Thursday, and5 mg (1 tab) all other days.  He does have a hx of alcohol abuse and admits to drinking4-5larger beers a day. He is followed by Cardiologists for A Fib, CAD, and Cardiomyopathy. Hypertension This is a chronic problem. The current episode started more than 1 year ago. The problem has been resolved since onset. The problem is controlled. Pertinent negatives include no malaise/fatigue, peripheral edema or shortness of breath. Risk factors for coronary artery disease include dyslipidemia, male gender, smoking/tobacco exposure and sedentary lifestyle. The current treatment provides moderate improvement.      Review of Systems  Constitutional: Negative for malaise/fatigue.  Respiratory: Negative for shortness of breath.   All other systems reviewed and are negative.      Objective:   Physical Exam Vitals reviewed.  Constitutional:      General: He is not in acute distress.    Appearance: He is well-developed.  HENT:     Head: Normocephalic.     Right Ear: Tympanic membrane normal.     Left Ear: Tympanic membrane normal.  Eyes:     General:        Right eye: No discharge.        Left eye: No discharge.     Pupils: Pupils are equal, round, and reactive to light.  Neck:     Thyroid: No thyromegaly.  Cardiovascular:     Rate and Rhythm: Normal rate and regular rhythm.     Heart sounds: Normal heart sounds. No murmur heard.   Pulmonary:     Effort: Pulmonary effort is normal. No respiratory distress.     Breath sounds:  Normal breath sounds. No wheezing.  Abdominal:     General: Bowel sounds are normal. There is no distension.     Palpations: Abdomen is soft.     Tenderness: There is no abdominal tenderness.  Musculoskeletal:        General: No tenderness. Normal range of motion.     Cervical back: Normal range of motion and neck supple.  Skin:    General: Skin is warm and dry.     Findings: Bruising present. No erythema or rash.  Neurological:     Mental Status: He is alert and oriented to person, place, and time.     Cranial Nerves: No cranial nerve deficit.     Deep Tendon Reflexes: Reflexes are normal and symmetric.  Psychiatric:        Behavior: Behavior normal.        Thought Content: Thought content normal.        Judgment: Judgment normal.          BP 132/70   Pulse 78   Temp (!) 97 F (36.1 C) (Temporal)   Ht 5\' 10"  (1.778 m)   Wt 164 lb 9.6 oz (74.7 kg)   BMI 23.62 kg/m   Assessment & Plan:  Jason Spencer comes in today with chief complaint of Coagulation Disorder and Anticoagulation   Diagnosis and orders addressed:  1. Atrial fibrillation  with RVR (HCC) - CoaguChek XS/INR Waived  2. Essential hypertension, benign - lisinopril (ZESTRIL) 40 MG tablet; Take 1 tablet (40 mg total) by mouth daily.  Dispense: 90 tablet; Refill: 3  3. Tobacco abuse  4. Current use of anticoagulant therapy  Description   INR today is 2.2(goal 2-3)  perfect!  Continue current  dose of 7.5 mg (1 1/2 tabs)  Tuesday , Thursday,  5 mg (1 tab) all other days.     RTO in 1 month  Jason Spencer, [place]

## 2020-11-30 NOTE — Patient Instructions (Signed)
Description   INR today is 2.2(goal 2-3)  perfect!  Continue current  dose of 7.5 mg (1 1/2 tabs)  Tuesday , Thursday,  5 mg (1 tab) all other days.      

## 2020-12-31 ENCOUNTER — Ambulatory Visit (INDEPENDENT_AMBULATORY_CARE_PROVIDER_SITE_OTHER): Payer: Medicare HMO | Admitting: Family

## 2020-12-31 ENCOUNTER — Encounter: Payer: Self-pay | Admitting: Family

## 2020-12-31 ENCOUNTER — Other Ambulatory Visit: Payer: Self-pay

## 2020-12-31 VITALS — BP 115/63 | HR 90 | Temp 96.4°F | Resp 20 | Ht 70.0 in | Wt 161.0 lb

## 2020-12-31 DIAGNOSIS — I4891 Unspecified atrial fibrillation: Secondary | ICD-10-CM

## 2020-12-31 DIAGNOSIS — I2510 Atherosclerotic heart disease of native coronary artery without angina pectoris: Secondary | ICD-10-CM | POA: Diagnosis not present

## 2020-12-31 DIAGNOSIS — I4892 Unspecified atrial flutter: Secondary | ICD-10-CM | POA: Diagnosis not present

## 2020-12-31 DIAGNOSIS — F411 Generalized anxiety disorder: Secondary | ICD-10-CM

## 2020-12-31 DIAGNOSIS — E785 Hyperlipidemia, unspecified: Secondary | ICD-10-CM | POA: Diagnosis not present

## 2020-12-31 DIAGNOSIS — I10 Essential (primary) hypertension: Secondary | ICD-10-CM | POA: Diagnosis not present

## 2020-12-31 DIAGNOSIS — Z7901 Long term (current) use of anticoagulants: Secondary | ICD-10-CM

## 2020-12-31 DIAGNOSIS — F1010 Alcohol abuse, uncomplicated: Secondary | ICD-10-CM | POA: Diagnosis not present

## 2020-12-31 DIAGNOSIS — J42 Unspecified chronic bronchitis: Secondary | ICD-10-CM

## 2020-12-31 DIAGNOSIS — Z720 Tobacco use: Secondary | ICD-10-CM

## 2020-12-31 LAB — CBC WITH DIFFERENTIAL/PLATELET
Basophils Absolute: 0 10*3/uL (ref 0.0–0.2)
Basos: 0 %
EOS (ABSOLUTE): 0.1 10*3/uL (ref 0.0–0.4)
Eos: 1 %
Hematocrit: 40.9 % (ref 37.5–51.0)
Hemoglobin: 14.5 g/dL (ref 13.0–17.7)
Immature Grans (Abs): 0.1 10*3/uL (ref 0.0–0.1)
Immature Granulocytes: 1 %
Lymphocytes Absolute: 1 10*3/uL (ref 0.7–3.1)
Lymphs: 14 %
MCH: 35.9 pg — ABNORMAL HIGH (ref 26.6–33.0)
MCHC: 35.5 g/dL (ref 31.5–35.7)
MCV: 101 fL — ABNORMAL HIGH (ref 79–97)
Monocytes Absolute: 1 10*3/uL — ABNORMAL HIGH (ref 0.1–0.9)
Monocytes: 14 %
Neutrophils Absolute: 4.9 10*3/uL (ref 1.4–7.0)
Neutrophils: 70 %
Platelets: 245 10*3/uL (ref 150–450)
RBC: 4.04 x10E6/uL — ABNORMAL LOW (ref 4.14–5.80)
RDW: 12.2 % (ref 11.6–15.4)
WBC: 7.1 10*3/uL (ref 3.4–10.8)

## 2020-12-31 LAB — COAGUCHEK XS/INR WAIVED
INR: 2.2 — ABNORMAL HIGH (ref 0.9–1.1)
Prothrombin Time: 26.1 s

## 2020-12-31 LAB — CMP14+EGFR
ALT: 16 IU/L (ref 0–44)
AST: 33 IU/L (ref 0–40)
Albumin/Globulin Ratio: 1.2 (ref 1.2–2.2)
Albumin: 3.8 g/dL (ref 3.8–4.8)
Alkaline Phosphatase: 75 IU/L (ref 44–121)
BUN/Creatinine Ratio: 5 — ABNORMAL LOW (ref 10–24)
BUN: 6 mg/dL — ABNORMAL LOW (ref 8–27)
Bilirubin Total: 0.6 mg/dL (ref 0.0–1.2)
CO2: 22 mmol/L (ref 20–29)
Calcium: 9.2 mg/dL (ref 8.6–10.2)
Chloride: 95 mmol/L — ABNORMAL LOW (ref 96–106)
Creatinine, Ser: 1.28 mg/dL — ABNORMAL HIGH (ref 0.76–1.27)
Globulin, Total: 3.3 g/dL (ref 1.5–4.5)
Glucose: 102 mg/dL — ABNORMAL HIGH (ref 65–99)
Potassium: 4.5 mmol/L (ref 3.5–5.2)
Sodium: 134 mmol/L (ref 134–144)
Total Protein: 7.1 g/dL (ref 6.0–8.5)
eGFR: 61 mL/min/{1.73_m2} (ref >59)

## 2020-12-31 MED ORDER — WARFARIN SODIUM 5 MG PO TABS: ORAL_TABLET | ORAL | 3 refills | Status: DC

## 2020-12-31 MED ORDER — METOPROLOL SUCCINATE ER 100 MG PO TB24
100.0000 mg | ORAL_TABLET | Freq: Every day | ORAL | 3 refills | Status: DC
Start: 2020-12-31 — End: 2021-06-23

## 2020-12-31 NOTE — Patient Instructions (Signed)
Description   INR today is 2.2(goal 2-3)  perfect!  Continue current  dose of 7.5 mg (1 1/2 tabs)  Tuesday , Thursday,  5 mg (1 tab) all other days.

## 2020-12-31 NOTE — Progress Notes (Signed)
Subjective:    Patient ID: Jason Spencer, male    DOB: 10/22/1950, 70 y.o.   MRN: [id number]  No chief complaint on file.  Pt presents to the office today for INR check.  He is currently taking warfarin for A Fib. See anticoagulation flow sheet. Denies any bleeding, changes in diet. States he has bruising.     His INR today is 2.2. He is currently taking 7.5 mg (1 1/2 tabs)  Tuesday , Thursday, and 5 mg (1 tab) all other days.    He does have a hx of alcohol abuse and admits to drinking 4-5  larger beers a day. He is followed by Cardiologists for A Fib, CAD, and Cardiomyopathy.  Hypertension This is a chronic problem. The current episode started more than 1 year ago. The problem has been resolved since onset. The problem is controlled. Associated symptoms include anxiety. Pertinent negatives include no malaise/fatigue, peripheral edema or shortness of breath. Risk factors for coronary artery disease include dyslipidemia, diabetes mellitus, obesity, male gender and sedentary lifestyle. The current treatment provides moderate improvement. Hypertensive end-organ damage includes kidney disease, CAD/MI and heart failure.  Hyperlipidemia This is a chronic problem. The current episode started more than 1 year ago. Recent lipid tests were reviewed and are normal. Pertinent negatives include no shortness of breath. Current antihyperlipidemic treatment includes statins. The current treatment provides moderate improvement of lipids. Risk factors for coronary artery disease include male sex, hypertension and a sedentary lifestyle.  Anxiety Presents for follow-up visit. Symptoms include depressed mood, excessive worry, irritability, nervous/anxious behavior and restlessness. Patient reports no shortness of breath. Symptoms occur most days. The severity of symptoms is moderate.    COPD States his breathing is stable. He continues to smoke 1/2 pack a day.  Review of Systems  Constitutional:  Positive for  irritability. Negative for malaise/fatigue.  Respiratory:  Negative for shortness of breath.   Psychiatric/Behavioral:  The patient is nervous/anxious.   All other systems reviewed and are negative.     Objective:   Physical Exam Vitals reviewed.  Constitutional:      General: He is not in acute distress.    Appearance: He is well-developed.  HENT:     Head: Normocephalic.     Right Ear: Tympanic membrane normal.     Left Ear: Tympanic membrane normal.  Eyes:     General:        Right eye: No discharge.        Left eye: No discharge.     Pupils: Pupils are equal, round, and reactive to light.  Neck:     Thyroid: No thyromegaly.  Cardiovascular:     Rate and Rhythm: Normal rate and regular rhythm.     Heart sounds: Normal heart sounds. No murmur heard. Pulmonary:     Effort: Pulmonary effort is normal. No respiratory distress.     Breath sounds: Rhonchi present. No wheezing.  Abdominal:     General: Bowel sounds are normal. There is no distension.     Palpations: Abdomen is soft.     Tenderness: There is no abdominal tenderness.  Musculoskeletal:        General: No tenderness. Normal range of motion.     Cervical back: Normal range of motion and neck supple.  Skin:    General: Skin is warm and dry.     Findings: No erythema or rash.     Comments: Strong body odor  Neurological:     Mental Status: He  is alert and oriented to person, place, and time.     Cranial Nerves: No cranial nerve deficit.     Deep Tendon Reflexes: Reflexes are normal and symmetric.  Psychiatric:        Behavior: Behavior normal.        Thought Content: Thought content normal.        Judgment: Judgment normal.      BP 115/63   Pulse 90   Temp (!) 96.4 F (35.8 C) (Temporal)   Resp 20   Ht _0  (1.778 m)   Wt 161 lb (73 kg)   SpO2 96%   BMI 23.10 kg/m      Assessment & Plan:  Jason Spencer comes in today with chief complaint of No chief complaint on file.   Diagnosis and orders  addressed:  1. Atrial fibrillation with RVR ([place]) Description   INR today is 2.2(goal 2-3)  perfect!  Continue current  dose of 7.5 mg (1 1/2 tabs)  Tuesday , Thursday,  5 mg (1 tab) all other days.     - CoaguChek XS/INR Waived - warfarin (COUMADIN) 5 MG tablet; TAKE (1) TABLET AS DIR- ECTED BY [facility]  Dispense: 50 tablet; Refill: 3 - CMP14+EGFR - CBC with Differential/Platelet  2. Atrial fibrillation, new onset w/ RVR - metoprolol succinate (TOPROL XL) 100 MG 24 hr tablet; Take 1 tablet (100 mg total) by mouth daily. Take with or immediately following a meal.  Dispense: 90 tablet; Refill: 3 - warfarin (COUMADIN) 5 MG tablet; TAKE (1) TABLET AS DIR- ECTED BY [facility]  Dispense: 50 tablet; Refill: 3 - CMP14+EGFR - CBC with Differential/Platelet  3. Essential hypertension, benign  - metoprolol succinate (TOPROL XL) 100 MG 24 hr tablet; Take 1 tablet (100 mg total) by mouth daily. Take with or immediately following a meal.  Dispense: 90 tablet; Refill: 3 - CMP14+EGFR - CBC with Differential/Platelet  4. Atrial fibrillation, unspecified type (HCC) - warfarin (COUMADIN) 5 MG tablet; TAKE (1) TABLET AS DIR- ECTED BY [facility]  Dispense: 50 tablet; Refill: 3 - CMP14+EGFR - CBC with Differential/Platelet  5. Coronary artery disease involving native coronary artery of native heart without angina pectoris - warfarin (COUMADIN) 5 MG tablet; TAKE (1) TABLET AS DIR- ECTED BY [facility]  Dispense: 50 tablet; Refill: 3 - CMP14+EGFR - CBC with Differential/Platelet  6. Atrial fibrillation, new onset (HCC) - metoprolol succinate (TOPROL XL) 100 MG 24 hr tablet; Take 1 tablet (100 mg total) by mouth daily. Take with or immediately following a meal.  Dispense: 90 tablet; Refill: 3 - warfarin (COUMADIN) 5 MG tablet; TAKE (1) TABLET AS DIR- ECTED BY [facility]  Dispense: 50 tablet; Refill: 3 - CMP14+EGFR - CBC with Differential/Platelet  7. Atrial flutter,  unspecified type ([place]) - CMP14+EGFR - CBC with Differential/Platelet  8. Chronic bronchitis, unspecified chronic bronchitis type ([place]) - CMP14+EGFR - CBC with Differential/Platelet  9. Hyperlipidemia, unspecified hyperlipidemia type - CMP14+EGFR - CBC with Differential/Platelet  10. Current use of anticoagulant therapy - CMP14+EGFR - CBC with Differential/Platelet  11. GAD (generalized anxiety disorder) - CMP14+EGFR - CBC with Differential/Platelet  12. Alcohol abuse - CMP14+EGFR - CBC with Differential/Platelet  13. Tobacco abuse - CMP14+EGFR - CBC with Differential/Platelet   Labs pending Health Maintenance reviewed Diet and exercise encouraged  Follow up plan: 1 month   [name], FNP

## 2021-01-31 ENCOUNTER — Ambulatory Visit (INDEPENDENT_AMBULATORY_CARE_PROVIDER_SITE_OTHER): Payer: Medicare HMO | Admitting: Family

## 2021-01-31 ENCOUNTER — Encounter: Payer: Self-pay | Admitting: Family

## 2021-01-31 ENCOUNTER — Other Ambulatory Visit: Payer: Self-pay

## 2021-01-31 VITALS — BP 133/75 | HR 71 | Temp 97.1°F | Ht 70.0 in | Wt 166.5 lb

## 2021-01-31 DIAGNOSIS — J42 Unspecified chronic bronchitis: Secondary | ICD-10-CM | POA: Diagnosis not present

## 2021-01-31 DIAGNOSIS — Z7901 Long term (current) use of anticoagulants: Secondary | ICD-10-CM | POA: Diagnosis not present

## 2021-01-31 DIAGNOSIS — I4892 Unspecified atrial flutter: Secondary | ICD-10-CM

## 2021-01-31 DIAGNOSIS — F411 Generalized anxiety disorder: Secondary | ICD-10-CM

## 2021-01-31 DIAGNOSIS — I10 Essential (primary) hypertension: Secondary | ICD-10-CM

## 2021-01-31 DIAGNOSIS — E785 Hyperlipidemia, unspecified: Secondary | ICD-10-CM | POA: Diagnosis not present

## 2021-01-31 DIAGNOSIS — I2510 Atherosclerotic heart disease of native coronary artery without angina pectoris: Secondary | ICD-10-CM

## 2021-01-31 DIAGNOSIS — I429 Cardiomyopathy, unspecified: Secondary | ICD-10-CM | POA: Diagnosis not present

## 2021-01-31 DIAGNOSIS — F1010 Alcohol abuse, uncomplicated: Secondary | ICD-10-CM

## 2021-01-31 DIAGNOSIS — I4891 Unspecified atrial fibrillation: Secondary | ICD-10-CM

## 2021-01-31 LAB — COAGUCHEK XS/INR WAIVED
INR: 3.2 — ABNORMAL HIGH (ref 0.9–1.1)
Prothrombin Time: 37.9 s

## 2021-01-31 NOTE — Progress Notes (Signed)
Subjective:    Patient ID: Jason Spencer, male    DOB: [birth year], 70 y.o.   MRN: [id number]  Chief Complaint  Patient presents with   Anticoagulation   Pt presents to the office today for INR check and chronic follow up. He is currently taking warfarin for A Fib. See anticoagulation flow sheet. Denies any bleeding, missed, extra, or changes in diet.     His INR today is 3.2. He is currently taking 7.5 mg (1 1/2 tabs)  Tuesday , Thursday, and 5 mg (1 tab) all other days.    He does have a hx of alcohol abuse and admits to drinking 4-5  larger beers a day. He is followed by Cardiologists for A Fib, CAD, and Cardiomyopathy.   Hypertension This is a chronic problem. The current episode started more than 1 year ago. The problem has been resolved since onset. The problem is controlled. Associated symptoms include anxiety. Pertinent negatives include no malaise/fatigue, peripheral edema or shortness of breath. Risk factors for coronary artery disease include dyslipidemia, male gender and sedentary lifestyle. The current treatment provides moderate improvement.  Hyperlipidemia This is a chronic problem. The current episode started more than 1 year ago. Pertinent negatives include no shortness of breath. Current antihyperlipidemic treatment includes statins. The current treatment provides moderate improvement of lipids. Risk factors for coronary artery disease include dyslipidemia, hypertension, male sex and a sedentary lifestyle.  Anxiety Presents for follow-up visit. Symptoms include insomnia. Patient reports no depressed mood, irritability, nervous/anxious behavior or shortness of breath. Symptoms occur most days. The severity of symptoms is moderate.    COPD Continues to smoke 1/2 pack a day. Has intermittent SOB.    Review of Systems  Constitutional:  Negative for irritability and malaise/fatigue.  Respiratory:  Negative for shortness of breath.   Psychiatric/Behavioral:  The patient  has insomnia. The patient is not nervous/anxious.   All other systems reviewed and are negative.     Objective:   Physical Exam Vitals reviewed.  Constitutional:      General: He is not in acute distress.    Appearance: He is well-developed.  HENT:     Head: Normocephalic.     Right Ear: Tympanic membrane normal.     Left Ear: Tympanic membrane normal.  Eyes:     General:        Right eye: No discharge.        Left eye: No discharge.     Pupils: Pupils are equal, round, and reactive to light.  Neck:     Thyroid: No thyromegaly.  Cardiovascular:     Rate and Rhythm: Normal rate and regular rhythm.     Heart sounds: Normal heart sounds. No murmur heard. Pulmonary:     Effort: Pulmonary effort is normal. No respiratory distress.     Breath sounds: Normal breath sounds. No wheezing.  Abdominal:     General: Bowel sounds are normal. There is no distension.     Palpations: Abdomen is soft.     Tenderness: There is no abdominal tenderness.  Musculoskeletal:        General: No tenderness. Normal range of motion.     Cervical back: Normal range of motion and neck supple.  Skin:    General: Skin is warm and dry.     Findings: No erythema or rash.  Neurological:     Mental Status: He is alert and oriented to person, place, and time.     Cranial Nerves: No cranial nerve  deficit.     Deep Tendon Reflexes: Reflexes are normal and symmetric.  Psychiatric:        Behavior: Behavior normal.        Thought Content: Thought content normal.        Judgment: Judgment normal.      BP 133/75   Pulse 71   Temp (!) 97.1 F (36.2 C) (Temporal)   Ht 5' 10"  (1.778 m)   Wt 166 lb 8 oz (75.5 kg)   SpO2 97%   BMI 23.89 kg/m      Assessment & Plan:  Jason Spencer comes in today with chief complaint of Anticoagulation   Diagnosis and orders addressed:  1. Atrial fibrillation with RVR (HCC) - CoaguChek XS/INR Waived - CMP14+EGFR  2. Atrial flutter, unspecified type ([place]) -  CMP14+EGFR  3. Cardiomyopathy, unspecified type ([place])  - CMP14+EGFR  4. Coronary artery disease involving native coronary artery of native heart without angina pectoris - CMP14+EGFR  5. Essential hypertension, benign - CMP14+EGFR  6. Chronic bronchitis, unspecified chronic bronchitis type ([place]) - CMP14+EGFR  7. Hyperlipidemia, unspecified hyperlipidemia type - CMP14+EGFR - Lipid panel  8. Current use of anticoagulant therapy  - CMP14+EGFR  9. GAD (generalized anxiety disorder) - CMP14+EGFR  10. Alcohol abuse - CMP14+EGFR   Labs pending Health Maintenance reviewed Diet and exercise encouraged  Follow up plan: 2 weeks for INR recheck    [name], FNP

## 2021-01-31 NOTE — Patient Instructions (Signed)
Description   INR today is 3.2 (goal 2-3)  Too thin.   Hold today's (01/31/21), then Continue current  dose of 7.5 mg (1 1/2 tabs)  Tuesday , Thursday,  5 mg (1 tab) all other days.

## 2021-02-15 ENCOUNTER — Encounter: Payer: Self-pay | Admitting: Family

## 2021-02-15 ENCOUNTER — Other Ambulatory Visit: Payer: Self-pay

## 2021-02-15 ENCOUNTER — Ambulatory Visit (INDEPENDENT_AMBULATORY_CARE_PROVIDER_SITE_OTHER): Payer: Medicare HMO | Admitting: Family

## 2021-02-15 VITALS — BP 116/67 | HR 79 | Temp 97.0°F | Ht 70.0 in | Wt 165.0 lb

## 2021-02-15 DIAGNOSIS — Z720 Tobacco use: Secondary | ICD-10-CM

## 2021-02-15 DIAGNOSIS — I4892 Unspecified atrial flutter: Secondary | ICD-10-CM

## 2021-02-15 DIAGNOSIS — Z7901 Long term (current) use of anticoagulants: Secondary | ICD-10-CM

## 2021-02-15 DIAGNOSIS — I4891 Unspecified atrial fibrillation: Secondary | ICD-10-CM

## 2021-02-15 LAB — COAGUCHEK XS/INR WAIVED
INR: 2.7 — ABNORMAL HIGH (ref 0.9–1.1)
Prothrombin Time: 32.6 s

## 2021-02-15 NOTE — Progress Notes (Signed)
Subjective:    Patient ID: Jason Spencer, male    DOB: 09/06/1950, 70 y.o.   MRN: [id number]  Chief Complaint  Patient presents with   Anticoagulation    HPI Pt presents to the office today for INR check.  He is currently taking warfarin for A Fib. See anticoagulation flow sheet. Denies any bleeding, missed, extra, or changes in diet.     His INR today is 2.7. He is currently taking 7.5 mg (1 1/2 tabs)  Tuesday , Thursday, and 5 mg (1 tab) all other days.    He does have a hx of alcohol abuse and admits to drinking 4-5  larger beers a day. He is followed by Cardiologists for A Fib, CAD, and Cardiomyopathy.   Review of Systems  All other systems reviewed and are negative.     Objective:   Physical Exam Vitals reviewed.  Constitutional:      General: He is not in acute distress.    Appearance: He is well-developed.  HENT:     Head: Normocephalic.  Eyes:     General:        Right eye: No discharge.        Left eye: No discharge.     Pupils: Pupils are equal, round, and reactive to light.  Neck:     Thyroid: No thyromegaly.  Cardiovascular:     Rate and Rhythm: Normal rate and regular rhythm.     Heart sounds: Normal heart sounds. No murmur heard. Pulmonary:     Effort: Pulmonary effort is normal. No respiratory distress.     Breath sounds: Normal breath sounds. No wheezing.  Abdominal:     General: Bowel sounds are normal. There is no distension.     Palpations: Abdomen is soft.     Tenderness: There is no abdominal tenderness.  Musculoskeletal:        General: No tenderness. Normal range of motion.     Cervical back: Normal range of motion and neck supple.  Skin:    General: Skin is warm and dry.     Findings: Bruising present. No erythema or rash.  Neurological:     Mental Status: He is alert and oriented to person, place, and time.     Cranial Nerves: No cranial nerve deficit.     Deep Tendon Reflexes: Reflexes are normal and symmetric.  Psychiatric:         Behavior: Behavior normal.        Thought Content: Thought content normal.        Judgment: Judgment normal.     BP 116/67   Pulse 79   Temp (!) 97 F (36.1 C) (Temporal)   Ht 5\' 10"  (1.778 m)   Wt 165 lb (74.8 kg)   SpO2 100%   BMI 23.68 kg/m       Assessment & Plan:  Jason Spencer comes in today with chief complaint of Anticoagulation   Diagnosis and orders addressed:  1. Atrial fibrillation with RVR (HCC) - CoaguChek XS/INR Waived  2. Atrial flutter, unspecified type (HCC)  3. Atrial fibrillation, new onset w/ RVR  4. Current use of anticoagulant therapy  5. Tobacco abuse Description   INR today is 2.7 (goal 2-3)  Too thin.   Continue current  dose of 7.5 mg (1 1/2 tabs)  Tuesday , Thursday, 5 mg (1 tab) all other days.        Health Maintenance reviewed Diet and exercise encouraged  Follow up plan: 1  month   Jannifer Rodney, FNP

## 2021-02-15 NOTE — Patient Instructions (Signed)
Description   INR today is 2.7 (goal 2-3)  Too thin.   Continue current  dose of 7.5 mg (1 1/2 tabs)  Tuesday , Thursday, 5 mg (1 tab) all other days.

## 2021-03-18 ENCOUNTER — Ambulatory Visit (INDEPENDENT_AMBULATORY_CARE_PROVIDER_SITE_OTHER): Payer: Medicare HMO | Admitting: Family

## 2021-03-18 ENCOUNTER — Other Ambulatory Visit: Payer: Self-pay

## 2021-03-18 ENCOUNTER — Encounter: Payer: Self-pay | Admitting: Family

## 2021-03-18 VITALS — BP 142/81 | HR 66 | Temp 96.3°F | Ht 70.0 in | Wt 165.0 lb

## 2021-03-18 DIAGNOSIS — I2510 Atherosclerotic heart disease of native coronary artery without angina pectoris: Secondary | ICD-10-CM

## 2021-03-18 DIAGNOSIS — Z720 Tobacco use: Secondary | ICD-10-CM | POA: Diagnosis not present

## 2021-03-18 DIAGNOSIS — I4892 Unspecified atrial flutter: Secondary | ICD-10-CM | POA: Diagnosis not present

## 2021-03-18 DIAGNOSIS — I429 Cardiomyopathy, unspecified: Secondary | ICD-10-CM | POA: Diagnosis not present

## 2021-03-18 DIAGNOSIS — Z7901 Long term (current) use of anticoagulants: Secondary | ICD-10-CM

## 2021-03-18 DIAGNOSIS — J42 Unspecified chronic bronchitis: Secondary | ICD-10-CM

## 2021-03-18 DIAGNOSIS — I4891 Unspecified atrial fibrillation: Secondary | ICD-10-CM

## 2021-03-18 DIAGNOSIS — F1010 Alcohol abuse, uncomplicated: Secondary | ICD-10-CM | POA: Diagnosis not present

## 2021-03-18 DIAGNOSIS — I10 Essential (primary) hypertension: Secondary | ICD-10-CM

## 2021-03-18 LAB — COAGUCHEK XS/INR WAIVED
INR: 2.6 — ABNORMAL HIGH (ref 0.9–1.1)
Prothrombin Time: 30.9 s

## 2021-03-18 MED ORDER — WARFARIN SODIUM 5 MG PO TABS: ORAL_TABLET | ORAL | 3 refills | Status: DC

## 2021-03-18 MED ORDER — AMLODIPINE BESYLATE 5 MG PO TABS: 5.0000 mg | ORAL_TABLET | Freq: Every day | ORAL | 3 refills | Status: DC

## 2021-03-18 MED ORDER — LISINOPRIL 40 MG PO TABS: 40.0000 mg | ORAL_TABLET | Freq: Every day | ORAL | 3 refills | Status: DC

## 2021-03-18 NOTE — Progress Notes (Signed)
Subjective:    Patient ID: Jason Spencer, male    DOB: 1950-12-13, 70 y.o.   MRN: [id number]  Chief Complaint  Patient presents with   Anticoagulation   Pt presents to the office today for INR check and chronic follow up. He is currently taking warfarin for A Fib. See anticoagulation flow sheet. Denies any bleeding, missed, extra, or changes in diet.     His INR today is 2.6. He is currently taking 7.5 mg (1 1/2 tabs)  Tuesday , Thursday, and 5 mg (1 tab) all other days.    He does have a hx of alcohol abuse and admits to drinking 4-5  larger beers a day. He is followed by Cardiologists for A Fib, CAD, and Cardiomyopathy.  Hypertension This is a chronic problem. The current episode started more than 1 year ago. The problem has been waxing and waning since onset. The problem is uncontrolled. Pertinent negatives include no malaise/fatigue, peripheral edema or shortness of breath. Risk factors for coronary artery disease include dyslipidemia, obesity, male gender and sedentary lifestyle. The current treatment provides moderate improvement. Hypertensive end-organ damage includes CAD/MI.  Nicotine Dependence His urge triggers include company of smokers.  COPD States his breathing is the "same". He continues to smoke 1 pack a day. Wheezing today.    Review of Systems  Constitutional:  Negative for malaise/fatigue.  Respiratory:  Negative for shortness of breath.   All other systems reviewed and are negative.     Objective:   Physical Exam Vitals reviewed.  Constitutional:      General: He is not in acute distress.    Appearance: He is well-developed.     Comments: Foul body odor  HENT:     Head: Normocephalic.     Right Ear: Tympanic membrane normal.     Left Ear: Tympanic membrane normal.  Eyes:     General:        Right eye: No discharge.        Left eye: No discharge.     Pupils: Pupils are equal, round, and reactive to light.  Neck:     Thyroid: No thyromegaly.   Cardiovascular:     Rate and Rhythm: Normal rate and regular rhythm.     Heart sounds: Normal heart sounds. No murmur heard. Pulmonary:     Effort: Pulmonary effort is normal. No respiratory distress.     Breath sounds: Normal breath sounds. No wheezing.  Abdominal:     General: Bowel sounds are normal. There is no distension.     Palpations: Abdomen is soft.     Tenderness: There is no abdominal tenderness.  Musculoskeletal:        General: No tenderness. Normal range of motion.     Cervical back: Normal range of motion and neck supple.  Skin:    General: Skin is warm and dry.     Findings: Bruising present. No erythema or rash.  Neurological:     Mental Status: He is alert and oriented to person, place, and time.     Cranial Nerves: No cranial nerve deficit.     Deep Tendon Reflexes: Reflexes are normal and symmetric.  Psychiatric:        Behavior: Behavior normal.        Thought Content: Thought content normal.        Judgment: Judgment normal.      BP (!) 142/81   Pulse 66   Temp (!) 96.3 F (35.7 C) (Temporal)  Ht 5' 10"  (1.778 m)   Wt 165 lb (74.8 kg)   SpO2 98%   BMI 23.68 kg/m      Assessment & Plan:  Emad Brechtel comes in today with chief complaint of Anticoagulation   Diagnosis and orders addressed:  1. Atrial fibrillation with RVR ([place]) Description   INR today is 2.6 (goal 2-3)  At goal!  Continue current  dose of 7.5 mg (1 1/2 tabs)  Tuesday , Thursday, 5 mg (1 tab) all other days.      - CoaguChek XS/INR Waived - warfarin (COUMADIN) 5 MG tablet; TAKE (1) TABLET AS DIR- ECTED BY [facility]  Dispense: 50 tablet; Refill: 3 - CMP14+EGFR  2. Essential hypertension, benign - lisinopril (ZESTRIL) 40 MG tablet; Take 1 tablet (40 mg total) by mouth daily.  Dispense: 90 tablet; Refill: 3 - amLODipine (NORVASC) 5 MG tablet; Take 1 tablet (5 mg total) by mouth daily.  Dispense: 90 tablet; Refill: 3 - CMP14+EGFR  3. Atrial fibrillation,  unspecified type (HCC) - warfarin (COUMADIN) 5 MG tablet; TAKE (1) TABLET AS DIR- ECTED BY [facility]  Dispense: 50 tablet; Refill: 3 - CMP14+EGFR  4. Coronary artery disease involving native coronary artery of native heart without angina pectoris - warfarin (COUMADIN) 5 MG tablet; TAKE (1) TABLET AS DIR- ECTED BY [facility]  Dispense: 50 tablet; Refill: 3 - amLODipine (NORVASC) 5 MG tablet; Take 1 tablet (5 mg total) by mouth daily.  Dispense: 90 tablet; Refill: 3 - CMP14+EGFR  5. Atrial fibrillation, new onset (HCC) - warfarin (COUMADIN) 5 MG tablet; TAKE (1) TABLET AS DIR- ECTED BY [facility]  Dispense: 50 tablet; Refill: 3 - CMP14+EGFR  6. Atrial flutter, unspecified type ([place]) - CMP14+EGFR  7. Cardiomyopathy, unspecified type ([place]) - CMP14+EGFR  8. Chronic bronchitis, unspecified chronic bronchitis type ([place]) - CMP14+EGFR  9. Current use of anticoagulant therapy - CMP14+EGFR  10. Alcohol abuse - CMP14+EGFR  11. Tobacco abuse - CMP14+EGFR   Labs pending Health Maintenance reviewed Diet and exercise encouraged  Follow up plan: 1 month   [name], FNP

## 2021-03-18 NOTE — Patient Instructions (Signed)
Description   INR today is 2.6 (goal 2-3)  At goal!  Continue current  dose of 7.5 mg (1 1/2 tabs)  Tuesday , Thursday, 5 mg (1 tab) all other days.

## 2021-04-18 ENCOUNTER — Other Ambulatory Visit: Payer: Self-pay

## 2021-04-18 ENCOUNTER — Ambulatory Visit (INDEPENDENT_AMBULATORY_CARE_PROVIDER_SITE_OTHER): Payer: Medicare HMO | Admitting: Family

## 2021-04-18 ENCOUNTER — Encounter: Payer: Self-pay | Admitting: Family

## 2021-04-18 VITALS — BP 125/71 | HR 80 | Temp 96.0°F | Ht 70.0 in | Wt 169.2 lb

## 2021-04-18 DIAGNOSIS — I10 Essential (primary) hypertension: Secondary | ICD-10-CM

## 2021-04-18 DIAGNOSIS — Z7901 Long term (current) use of anticoagulants: Secondary | ICD-10-CM

## 2021-04-18 DIAGNOSIS — I4891 Unspecified atrial fibrillation: Secondary | ICD-10-CM | POA: Diagnosis not present

## 2021-04-18 DIAGNOSIS — I2510 Atherosclerotic heart disease of native coronary artery without angina pectoris: Secondary | ICD-10-CM

## 2021-04-18 DIAGNOSIS — F411 Generalized anxiety disorder: Secondary | ICD-10-CM | POA: Diagnosis not present

## 2021-04-18 DIAGNOSIS — Z720 Tobacco use: Secondary | ICD-10-CM | POA: Diagnosis not present

## 2021-04-18 DIAGNOSIS — F1010 Alcohol abuse, uncomplicated: Secondary | ICD-10-CM

## 2021-04-18 DIAGNOSIS — J42 Unspecified chronic bronchitis: Secondary | ICD-10-CM

## 2021-04-18 DIAGNOSIS — E785 Hyperlipidemia, unspecified: Secondary | ICD-10-CM

## 2021-04-18 LAB — COAGUCHEK XS/INR WAIVED
INR: 1.2 — ABNORMAL HIGH (ref 0.9–1.1)
Prothrombin Time: 15 s

## 2021-04-18 NOTE — Progress Notes (Signed)
Subjective:    Patient ID: Jason Spencer, male    DOB: March 11, 1951, 70 y.o.   MRN: [id number]  Chief Complaint  Patient presents with   Anticoagulation   Pt presents to the office today for INR check and chronic follow up. He is currently taking warfarin for A Fib. See anticoagulation flow sheet. Denies any bleeding, missed, extra, or changes in diet.     His INR today is 1.2. He is currently taking 7.5 mg (1 1/2 tabs)  Tuesday , Thursday, and 5 mg (1 tab) all other days.  He states he forgot his dose on Saturday. See anticoagulation flowsheet.    He does have a hx of alcohol abuse and admits to drinking 4-5  larger beers a day. He is followed by Cardiologists for A Fib, CAD, and Cardiomyopathy.  Hypertension This is a chronic problem. The current episode started more than 1 year ago. The problem has been resolved since onset. The problem is controlled. Associated symptoms include anxiety. Pertinent negatives include no malaise/fatigue, peripheral edema or shortness of breath. Risk factors for coronary artery disease include dyslipidemia and obesity. The current treatment provides moderate improvement.  Nicotine Dependence Presents for follow-up visit. Symptoms include irritability. His urge triggers include company of smokers. The symptoms have been stable. He smokes < 1/2 a pack of cigarettes per day.  Anxiety Presents for follow-up visit. Symptoms include depressed mood, irritability and nervous/anxious behavior. Patient reports no shortness of breath. Symptoms occur most days. The severity of symptoms is moderate.    Hyperlipidemia This is a chronic problem. The current episode started more than 1 year ago. Pertinent negatives include no shortness of breath. Current antihyperlipidemic treatment includes statins. The current treatment provides moderate improvement of lipids.  COPD Continues to smoke a 1/2 pack. Has intermittent SOB.    Review of Systems  Constitutional:  Positive  for irritability. Negative for malaise/fatigue.  Respiratory:  Negative for shortness of breath.   Psychiatric/Behavioral:  The patient is nervous/anxious.   All other systems reviewed and are negative.     Objective:   Physical Exam Vitals reviewed.  Constitutional:      General: He is not in acute distress.    Appearance: He is well-developed.  HENT:     Head: Normocephalic.  Eyes:     General:        Right eye: No discharge.        Left eye: No discharge.     Pupils: Pupils are equal, round, and reactive to light.  Neck:     Thyroid: No thyromegaly.  Cardiovascular:     Rate and Rhythm: Normal rate and regular rhythm.     Heart sounds: Normal heart sounds. No murmur heard. Pulmonary:     Effort: Pulmonary effort is normal. No respiratory distress.     Breath sounds: Rhonchi present. No wheezing.  Abdominal:     General: Bowel sounds are normal. There is no distension.     Palpations: Abdomen is soft.     Tenderness: There is no abdominal tenderness.  Musculoskeletal:        General: No tenderness. Normal range of motion.     Cervical back: Normal range of motion and neck supple.  Skin:    General: Skin is warm and dry.     Findings: No erythema or rash.  Neurological:     Mental Status: He is alert and oriented to person, place, and time.     Cranial Nerves: No cranial nerve  deficit.     Deep Tendon Reflexes: Reflexes are normal and symmetric.  Psychiatric:        Behavior: Behavior normal.        Thought Content: Thought content normal.        Judgment: Judgment normal.      BP 125/71   Pulse 80   Temp (!) 96 F (35.6 C) (Temporal)   Ht 5' 10"  (1.778 m)   Wt 169 lb 3.2 oz (76.7 kg)   BMI 24.28 kg/m      Assessment & Plan:  Audiel Scheiber comes in today with chief complaint of Anticoagulation   Diagnosis and orders addressed:  1. Atrial fibrillation with RVR ([place]) Description   INR today is 1.2 (goal 2-3) Too thick.   Take 10 mg today (04/18/21)  then resume current  dose of 7.5 mg (1 1/2 tabs)  Tuesday , Thursday, 5 mg (1 tab) all other days.      - CoaguChek XS/INR Waived - CMP14+EGFR  2. Essential hypertension, benign - CMP14+EGFR  3. Coronary artery disease involving native coronary artery of native heart without angina pectoris - CMP14+EGFR  4. Chronic bronchitis, unspecified chronic bronchitis type ([place]) - CMP14+EGFR  5. Hyperlipidemia, unspecified hyperlipidemia type - CMP14+EGFR  6. Current use of anticoagulant therapy - CMP14+EGFR  7. GAD (generalized anxiety disorder) - CMP14+EGFR  8. Alcohol abuse - CMP14+EGFR  9. Tobacco abuse - CMP14+EGFR   Labs pending Health Maintenance reviewed Diet and exercise encouraged  Follow up plan: 2 weeks    [name], FNP

## 2021-04-18 NOTE — Patient Instructions (Signed)
Description   INR today is 1.2 (goal 2-3) Too thick.   Take 10 mg today (04/18/21) then resume current  dose of 7.5 mg (1 1/2 tabs)  Tuesday , Thursday, 5 mg (1 tab) all other days.

## 2021-04-19 LAB — CMP14+EGFR
ALT: 15 IU/L (ref 0–44)
AST: 28 IU/L (ref 0–40)
Albumin/Globulin Ratio: 1.3 (ref 1.2–2.2)
Albumin: 4.2 g/dL (ref 3.8–4.8)
Alkaline Phosphatase: 71 IU/L (ref 44–121)
BUN/Creatinine Ratio: 7 — ABNORMAL LOW (ref 10–24)
BUN: 6 mg/dL — ABNORMAL LOW (ref 8–27)
Bilirubin Total: 0.8 mg/dL (ref 0.0–1.2)
CO2: 26 mmol/L (ref 20–29)
Calcium: 9.2 mg/dL (ref 8.6–10.2)
Chloride: 95 mmol/L — ABNORMAL LOW (ref 96–106)
Creatinine, Ser: 0.92 mg/dL (ref 0.76–1.27)
Globulin, Total: 3.3 g/dL (ref 1.5–4.5)
Glucose: 89 mg/dL (ref 70–99)
Potassium: 5.1 mmol/L (ref 3.5–5.2)
Sodium: 134 mmol/L (ref 134–144)
Total Protein: 7.5 g/dL (ref 6.0–8.5)
eGFR: 91 mL/min/{1.73_m2} (ref >59)

## 2021-05-03 ENCOUNTER — Ambulatory Visit (INDEPENDENT_AMBULATORY_CARE_PROVIDER_SITE_OTHER): Payer: Medicare HMO | Admitting: Family

## 2021-05-03 ENCOUNTER — Other Ambulatory Visit: Payer: Self-pay

## 2021-05-03 ENCOUNTER — Encounter: Payer: Self-pay | Admitting: Family

## 2021-05-03 VITALS — BP 134/70 | HR 81 | Temp 96.8°F | Ht 70.0 in | Wt 172.4 lb

## 2021-05-03 DIAGNOSIS — I10 Essential (primary) hypertension: Secondary | ICD-10-CM

## 2021-05-03 DIAGNOSIS — Z7901 Long term (current) use of anticoagulants: Secondary | ICD-10-CM | POA: Diagnosis not present

## 2021-05-03 DIAGNOSIS — I4891 Unspecified atrial fibrillation: Secondary | ICD-10-CM

## 2021-05-03 LAB — COAGUCHEK XS/INR WAIVED
INR: 2.2 — ABNORMAL HIGH (ref 0.9–1.1)
Prothrombin Time: 25.9 s

## 2021-05-03 NOTE — Progress Notes (Signed)
Subjective:    Patient ID: Jason Spencer, male    DOB: July 24, 1950, 71 y.o.   MRN: [id number]  Chief Complaint  Patient presents with   Anticoagulation   Pt presents to the office today for INR check and chronic follow up. He is currently taking warfarin for A Fib. See anticoagulation flow sheet. Denies any bleeding, missed, extra, or changes in diet.     His INR today is 2.2. He is currently taking 7.5 mg (1 1/2 tabs)  Tuesday , Thursday, and 5 mg (1 tab) all other days.  See anticoagulation flowsheet.    He does have a hx of alcohol abuse and admits to drinking 4-5  larger beers a day. He is followed by Cardiologists for A Fib, CAD, and Cardiomyopathy.   Hypertension This is a chronic problem. The current episode started more than 1 year ago. The problem has been resolved since onset. The problem is controlled. Associated symptoms include malaise/fatigue. Pertinent negatives include no peripheral edema or shortness of breath.     Review of Systems  Constitutional:  Positive for malaise/fatigue.  Respiratory:  Negative for shortness of breath.   All other systems reviewed and are negative.     Objective:   Physical Exam Vitals reviewed.  Constitutional:      General: He is not in acute distress.    Appearance: He is well-developed.  HENT:     Head: Normocephalic.     Right Ear: Tympanic membrane normal.     Left Ear: Tympanic membrane normal.  Eyes:     General:        Right eye: No discharge.        Left eye: No discharge.     Pupils: Pupils are equal, round, and reactive to light.  Neck:     Thyroid: No thyromegaly.  Cardiovascular:     Rate and Rhythm: Normal rate and regular rhythm.     Heart sounds: Normal heart sounds. No murmur heard. Pulmonary:     Effort: Pulmonary effort is normal. No respiratory distress.     Breath sounds: Normal breath sounds. No wheezing.  Abdominal:     General: Bowel sounds are normal. There is no distension.     Palpations:  Abdomen is soft.     Tenderness: There is no abdominal tenderness.  Musculoskeletal:        General: No tenderness. Normal range of motion.     Cervical back: Normal range of motion and neck supple.  Skin:    General: Skin is warm and dry.     Findings: No erythema or rash.  Neurological:     Mental Status: He is alert and oriented to person, place, and time.     Cranial Nerves: No cranial nerve deficit.     Deep Tendon Reflexes: Reflexes are normal and symmetric.  Psychiatric:        Behavior: Behavior normal.        Thought Content: Thought content normal.        Judgment: Judgment normal.      BP 134/70   Pulse 81   Temp (!) 96.8 F (36 C) (Temporal)   Ht 5\' 10"  (1.778 m)   Wt 172 lb 6.4 oz (78.2 kg)   SpO2 91%   BMI 24.74 kg/m      Assessment & Plan:  Jason Spencer comes in today with chief complaint of Anticoagulation   Diagnosis and orders addressed:  1. Atrial fibrillation with RVR (HCC) - CoaguChek  XS/INR Waived  2. Essential hypertension, benign  3. Current use of anticoagulant therapy Description   INR today is 2.2 (goal 2-3) Perfect!   Continue current  dose of 7.5 mg (1 1/2 tabs)  Tuesday , Thursday, 5 mg (1 tab) all other days.     RTO in 1 month   Jannifer Rodney, [place]

## 2021-05-03 NOTE — Patient Instructions (Signed)
Description   INR today is 2.2 (goal 2-3) Perfect!   Continue current  dose of 7.5 mg (1 1/2 tabs)  Tuesday , Thursday, 5 mg (1 tab) all other days.

## 2021-05-31 ENCOUNTER — Encounter: Payer: Self-pay | Admitting: Family

## 2021-05-31 ENCOUNTER — Ambulatory Visit: Payer: Medicare HMO | Admitting: Family

## 2021-06-06 ENCOUNTER — Telehealth: Payer: Self-pay | Admitting: Family

## 2021-06-23 ENCOUNTER — Telehealth: Payer: Self-pay | Admitting: Family

## 2021-06-23 DIAGNOSIS — I4891 Unspecified atrial fibrillation: Secondary | ICD-10-CM

## 2021-06-23 DIAGNOSIS — I2510 Atherosclerotic heart disease of native coronary artery without angina pectoris: Secondary | ICD-10-CM

## 2021-06-23 DIAGNOSIS — I10 Essential (primary) hypertension: Secondary | ICD-10-CM

## 2021-06-23 MED ORDER — WARFARIN SODIUM 5 MG PO TABS: ORAL_TABLET | ORAL | 0 refills | Status: DC

## 2021-06-23 MED ORDER — LISINOPRIL 40 MG PO TABS
40.0000 mg | ORAL_TABLET | Freq: Every day | ORAL | 0 refills | Status: DC
Start: 2021-06-23 — End: 2021-07-29

## 2021-06-23 MED ORDER — AMLODIPINE BESYLATE 5 MG PO TABS: 5.0000 mg | ORAL_TABLET | Freq: Every day | ORAL | 0 refills | Status: DC

## 2021-06-23 MED ORDER — METOPROLOL SUCCINATE ER 100 MG PO TB24: 100.0000 mg | ORAL_TABLET | Freq: Every day | ORAL | 0 refills | Status: DC

## 2021-06-23 NOTE — Telephone Encounter (Signed)
°  Prescription Request  06/23/2021  Is this a "Controlled Substance" medicine? NO  Have you seen your PCP in the last 2 weeks? No pt has appt on 07/05/21 with Christy   If YES, route message to pool  -  If NO, patient needs to be scheduled for appointment.  What is the name of the medication or equipment? lisinopril (ZESTRIL) 40 MG tablet warfarin (COUMADIN) 5 MG tablet amLODipine (NORVASC) 5 MG tablet  metoprolol succinate (TOPROL XL) 100 MG 24 hr tablet Have you contacted your pharmacy to request a refill? yes   Which pharmacy would you like this sent to? [facility] pt is about to be out wants enough until he is seen    Patient notified that their request is being sent to the clinical staff for review and that they should receive a response within 2 business days.

## 2021-06-23 NOTE — Telephone Encounter (Signed)
30 day supply sent to [facility], patient aware.

## 2021-07-05 ENCOUNTER — Encounter: Payer: Self-pay | Admitting: Family

## 2021-07-05 ENCOUNTER — Ambulatory Visit (INDEPENDENT_AMBULATORY_CARE_PROVIDER_SITE_OTHER): Payer: Medicare HMO | Admitting: Family

## 2021-07-05 VITALS — BP 134/81 | HR 82 | Temp 96.3°F | Ht 70.0 in | Wt 174.0 lb

## 2021-07-05 DIAGNOSIS — Z720 Tobacco use: Secondary | ICD-10-CM | POA: Diagnosis not present

## 2021-07-05 DIAGNOSIS — I4892 Unspecified atrial flutter: Secondary | ICD-10-CM

## 2021-07-05 DIAGNOSIS — Z7901 Long term (current) use of anticoagulants: Secondary | ICD-10-CM | POA: Diagnosis not present

## 2021-07-05 DIAGNOSIS — J441 Chronic obstructive pulmonary disease with (acute) exacerbation: Secondary | ICD-10-CM | POA: Diagnosis not present

## 2021-07-05 DIAGNOSIS — I2510 Atherosclerotic heart disease of native coronary artery without angina pectoris: Secondary | ICD-10-CM

## 2021-07-05 DIAGNOSIS — F1010 Alcohol abuse, uncomplicated: Secondary | ICD-10-CM

## 2021-07-05 DIAGNOSIS — I4891 Unspecified atrial fibrillation: Secondary | ICD-10-CM | POA: Diagnosis not present

## 2021-07-05 LAB — COAGUCHEK XS/INR WAIVED
INR: 2.5 — ABNORMAL HIGH (ref 0.9–1.1)
Prothrombin Time: 29.7 s

## 2021-07-05 NOTE — Patient Instructions (Signed)
Description   INR today is 2.5 (goal 2-3) Perfect!   Continue current  dose of 7.5 mg (1 1/2 tabs)  Tuesday , Thursday, 5 mg (1 tab) all other days.

## 2021-07-05 NOTE — Progress Notes (Signed)
Subjective:    Patient ID: Jason Spencer, male    DOB: 1951-04-05, 70 y.o.   MRN: [id number]  Chief Complaint  Patient presents with   Anticoagulation   Pt presents to the office today for INR check and chronic follow up. He is currently taking warfarin for A Fib. See anticoagulation flow sheet. Denies any bleeding, missed, extra, or changes in diet.     His INR today is 2.5. He is currently taking 7.5 mg (1 1/2 tabs)  Tuesday , Thursday, and 5 mg (1 tab) all other days.  See anticoagulation flowsheet.    He does have a hx of alcohol abuse and admits to drinking 4-5  larger beers a day. He is followed by Cardiologists for A Fib, CAD, and Cardiomyopathy.    Hypertension This is a chronic problem. The current episode started more than 1 year ago. The problem has been resolved since onset. The problem is controlled. Associated symptoms include malaise/fatigue. Pertinent negatives include no headaches, peripheral edema or shortness of breath. Risk factors for coronary artery disease include dyslipidemia, obesity, male gender, smoking/tobacco exposure and sedentary lifestyle. The current treatment provides moderate improvement.  Nicotine Dependence Presents for follow-up visit. His urge triggers include company of smokers. The symptoms have been stable. He smokes < 1/2 a pack of cigarettes per day.  Wheezing  This is a chronic problem. The current episode started more than 1 year ago. The problem has been waxing and waning. Associated symptoms include coughing and sputum production. Pertinent negatives include no chills, fever, headaches or shortness of breath. The symptoms are aggravated by smoke.     Review of Systems  Constitutional:  Positive for malaise/fatigue. Negative for chills and fever.  Respiratory:  Positive for cough, sputum production and wheezing. Negative for shortness of breath.   Neurological:  Negative for headaches.  All other systems reviewed and are negative.      Objective:   Physical Exam Vitals reviewed.  Constitutional:      General: He is not in acute distress.    Appearance: He is well-developed.  HENT:     Head: Normocephalic.     Right Ear: Tympanic membrane normal.     Left Ear: Tympanic membrane normal.  Eyes:     General:        Right eye: No discharge.        Left eye: No discharge.     Pupils: Pupils are equal, round, and reactive to light.  Neck:     Thyroid: No thyromegaly.  Cardiovascular:     Rate and Rhythm: Normal rate and regular rhythm.     Heart sounds: Normal heart sounds. No murmur heard. Pulmonary:     Effort: Pulmonary effort is normal. No respiratory distress.     Breath sounds: Wheezing and rhonchi present.  Abdominal:     General: Bowel sounds are normal. There is no distension.     Palpations: Abdomen is soft.     Tenderness: There is no abdominal tenderness.  Musculoskeletal:        General: No tenderness. Normal range of motion.     Cervical back: Normal range of motion and neck supple.  Skin:    General: Skin is warm and dry.     Findings: No erythema or rash.  Neurological:     Mental Status: He is alert and oriented to person, place, and time.     Cranial Nerves: No cranial nerve deficit.     Deep Tendon Reflexes:  Reflexes are normal and symmetric.  Psychiatric:        Behavior: Behavior normal.        Thought Content: Thought content normal.        Judgment: Judgment normal.      BP 134/81    Pulse 82    Temp (!) 96.3 F (35.7 C) (Temporal)    Ht 5\' 10"  (1.778 m)    Wt 174 lb (78.9 kg)    BMI 24.97 kg/m      Assessment & Plan:  Jason Spencer comes in today with chief complaint of Anticoagulation   Diagnosis and orders addressed:  1. Atrial fibrillation, unspecified type [facility]) Description   INR today is 2.5 (goal 2-3) Perfect!   Continue current  dose of 7.5 mg (1 1/2 tabs)  Tuesday , Thursday, 5 mg (1 tab) all other days.      - CoaguChek XS/INR Waived  2. Atrial  fibrillation with RVR (HCC)  3. Atrial flutter, unspecified type (HCC)  4. Coronary artery disease involving native coronary artery of native heart without angina pectoris  5. COPD exacerbation (HCC) Restart Trelegy. Sample given.  6. Alcohol abuse  7. Current use of anticoagulant therapy  8. Tobacco abuse     Health Maintenance reviewed Diet and exercise encouraged  Follow up plan: 1 months   Friday, FNP

## 2021-07-20 ENCOUNTER — Telehealth: Payer: Self-pay | Admitting: Family

## 2021-07-20 NOTE — Telephone Encounter (Signed)
°  Left message for patient to call back and schedule Medicare Annual Wellness Visit (AWV) to be completed by video or phone. ° °No hx of AWV  ° °Please schedule at anytime with [facility] Nurse Health Advisor --- Mary Jane ° °45 Minutes appointment  ° °Any questions, please call me at [phone]   °

## 2021-07-29 ENCOUNTER — Other Ambulatory Visit: Payer: Self-pay | Admitting: Family

## 2021-07-29 DIAGNOSIS — I10 Essential (primary) hypertension: Secondary | ICD-10-CM

## 2021-07-29 DIAGNOSIS — I4891 Unspecified atrial fibrillation: Secondary | ICD-10-CM

## 2021-07-29 DIAGNOSIS — I2510 Atherosclerotic heart disease of native coronary artery without angina pectoris: Secondary | ICD-10-CM

## 2021-07-29 MED ORDER — AMLODIPINE BESYLATE 5 MG PO TABS: 5.0000 mg | ORAL_TABLET | Freq: Every day | ORAL | 0 refills | Status: DC

## 2021-07-29 MED ORDER — WARFARIN SODIUM 5 MG PO TABS: ORAL_TABLET | ORAL | 0 refills | Status: DC

## 2021-07-29 MED ORDER — LISINOPRIL 40 MG PO TABS: 40.0000 mg | ORAL_TABLET | Freq: Every day | ORAL | 0 refills | Status: DC

## 2021-07-29 NOTE — Telephone Encounter (Signed)
°  Prescription Request  07/29/2021  What is the name of the medication or equipment? LISINOPRIL,AMLODIPINE, and WARFARIN  Have you contacted your pharmacy to request a refill? YES  Which pharmacy would you like this sent to? [facility]  Pt saw PCP 1 month ago and is scheduled to see her again on 08/05/21 but is out of these meds and needs refill sent to [facility] asap.

## 2021-08-01 DIAGNOSIS — S0532XA Ocular laceration without prolapse or loss of intraocular tissue, left eye, initial encounter: Secondary | ICD-10-CM | POA: Diagnosis not present

## 2021-08-01 DIAGNOSIS — T1502XA Foreign body in cornea, left eye, initial encounter: Secondary | ICD-10-CM | POA: Diagnosis not present

## 2021-08-02 DIAGNOSIS — S0532XD Ocular laceration without prolapse or loss of intraocular tissue, left eye, subsequent encounter: Secondary | ICD-10-CM | POA: Diagnosis not present

## 2021-08-02 DIAGNOSIS — H209 Unspecified iridocyclitis: Secondary | ICD-10-CM | POA: Diagnosis not present

## 2021-08-04 DIAGNOSIS — S0532XD Ocular laceration without prolapse or loss of intraocular tissue, left eye, subsequent encounter: Secondary | ICD-10-CM | POA: Diagnosis not present

## 2021-08-04 DIAGNOSIS — H209 Unspecified iridocyclitis: Secondary | ICD-10-CM | POA: Diagnosis not present

## 2021-08-05 ENCOUNTER — Encounter: Payer: Self-pay | Admitting: Family

## 2021-08-05 ENCOUNTER — Ambulatory Visit (INDEPENDENT_AMBULATORY_CARE_PROVIDER_SITE_OTHER): Payer: Medicare HMO | Admitting: Family

## 2021-08-05 VITALS — BP 122/59 | HR 78 | Temp 97.4°F | Ht 70.0 in | Wt 172.0 lb

## 2021-08-05 DIAGNOSIS — I10 Essential (primary) hypertension: Secondary | ICD-10-CM

## 2021-08-05 DIAGNOSIS — Z7901 Long term (current) use of anticoagulants: Secondary | ICD-10-CM | POA: Diagnosis not present

## 2021-08-05 DIAGNOSIS — F1010 Alcohol abuse, uncomplicated: Secondary | ICD-10-CM

## 2021-08-05 DIAGNOSIS — I4891 Unspecified atrial fibrillation: Secondary | ICD-10-CM

## 2021-08-05 DIAGNOSIS — J42 Unspecified chronic bronchitis: Secondary | ICD-10-CM

## 2021-08-05 LAB — COAGUCHEK XS/INR WAIVED
INR: 1.7 — ABNORMAL HIGH (ref 0.9–1.1)
Prothrombin Time: 20.9 s

## 2021-08-05 NOTE — Progress Notes (Signed)
Subjective:    Patient ID: Jason Spencer, male    DOB: [birth year], 71 y.o.   MRN: [id number]  Chief Complaint  Patient presents with   Anticoagulation   Pt presents to the office today for INR check and chronic follow up. He is currently taking warfarin for A Fib. See anticoagulation flow sheet. Denies any bleeding, missed, extra, or changes in diet.     His INR today is 1.7. He is currently taking 7.5 mg (1 1/2 tabs)  Tuesday , Thursday, and 5 mg (1 tab) all other days.  See anticoagulation flowsheet.    He does have a hx of alcohol abuse and admits to drinking 4-5  larger beers a day. He is followed by Cardiologists for A Fib, CAD, and Cardiomyopathy.   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 He is currently using antibiotic eye drops.   He has COPD and smokes a 1/2 pack and uses a wood stove. He has intermittent SOB and wheezing. He is using Telegy daily.  Hypertension This is a chronic problem. The current episode started more than 1 year ago. The problem has been resolved since onset. The problem is controlled. Pertinent negatives include no malaise/fatigue, peripheral edema or shortness of breath. Risk factors for coronary artery disease include dyslipidemia, male gender, smoking/tobacco exposure and sedentary lifestyle. The current treatment provides moderate improvement.     Review of Systems  Constitutional:  Negative for malaise/fatigue.  Respiratory:  Negative for shortness of breath.   All other systems reviewed and are negative.     Objective:   Physical Exam Vitals reviewed.  Constitutional:      General: He is not in acute distress.    Appearance: He is well-developed.  HENT:     Head: Normocephalic.     Right Ear: Tympanic membrane normal.     Left Ear: Tympanic membrane normal.  Eyes:     General:        Right eye: No discharge.        Left eye: No discharge.     Pupils: Pupils are equal, round, and reactive to light.   Neck:     Thyroid: No thyromegaly.  Cardiovascular:     Rate and Rhythm: Normal rate and regular rhythm.     Heart sounds: Normal heart sounds. No murmur heard. Pulmonary:     Effort: Pulmonary effort is normal. No respiratory distress.     Breath sounds: Rhonchi present. No wheezing.  Abdominal:     General: Bowel sounds are normal. There is no distension.     Palpations: Abdomen is soft.     Tenderness: There is no abdominal tenderness.  Musculoskeletal:        General: No tenderness. Normal range of motion.     Cervical back: Normal range of motion and neck supple.  Skin:    General: Skin is warm and dry.     Findings: No erythema or rash.  Neurological:     Mental Status: He is alert and oriented to person, place, and time.     Cranial Nerves: No cranial nerve deficit.     Deep Tendon Reflexes: Reflexes are normal and symmetric.  Psychiatric:        Behavior: Behavior normal.        Thought Content: Thought content normal.        Judgment: Judgment normal.      BP (!) 122/59    Pulse 78    Temp (!)  97.4 F (36.3 C) (Temporal)    Ht 5\' 10"  (1.778 m)    Wt 172 lb (78 kg)    BMI 24.68 kg/m      Assessment & Plan:  Jason Spencer comes in today with chief complaint of Anticoagulation   Diagnosis and orders addressed:  1. Atrial fibrillation, unspecified type ([place]) - CoaguChek XS/INR Waived  2. Essential hypertension, benign  3. Chronic bronchitis, unspecified chronic bronchitis type ([place])  4. Alcohol abuse  5. Current use of anticoagulant therapy  6. Atrial fibrillation with RVR ([place])   Description   INR today is 1.7 (goal 2-3) Too thick.   Take 7. 5 mg (1 1/2 tabs) today, 08/05/21, then continue current  dose of 7.5 mg (1 1/2 tabs)  Tuesday , Thursday, 5 mg (1 tab) all other days.     Refuses all health maintenance today Follow up in 2 weeks     [name], FNP

## 2021-08-05 NOTE — Patient Instructions (Signed)
Description   INR today is 1.7 (goal 2-3) Too thick.   Take 7. 5 mg (1 1/2 tabs) today, 08/05/21, then continue current  dose of 7.5 mg (1 1/2 tabs)  Tuesday , Thursday, 5 mg (1 tab) all other days.

## 2021-08-10 ENCOUNTER — Encounter: Payer: Self-pay | Admitting: Family

## 2021-08-15 ENCOUNTER — Telehealth: Payer: Self-pay | Admitting: Family

## 2021-08-15 NOTE — Telephone Encounter (Signed)
No answer unable to leave a message for patient to call back and schedule Medicare Annual Wellness Visit (AWV) to be completed by video or phone. ° °No hx of AWV eligible for AWVI as of 05/11/2019 per [facility]  ° °Please schedule at anytime with [facility] Nurse Health Advisor --- Mary Jane ° °45 Minutes appointment  ° °Any questions, please call me at [phone]   °

## 2021-08-17 ENCOUNTER — Ambulatory Visit (INDEPENDENT_AMBULATORY_CARE_PROVIDER_SITE_OTHER): Payer: Medicare HMO | Admitting: Family

## 2021-08-17 ENCOUNTER — Encounter: Payer: Self-pay | Admitting: Family

## 2021-08-17 VITALS — BP 151/77 | HR 83 | Temp 97.0°F | Ht 70.0 in | Wt 174.4 lb

## 2021-08-17 DIAGNOSIS — Z7901 Long term (current) use of anticoagulants: Secondary | ICD-10-CM

## 2021-08-17 DIAGNOSIS — I4891 Unspecified atrial fibrillation: Secondary | ICD-10-CM | POA: Diagnosis not present

## 2021-08-17 DIAGNOSIS — F1010 Alcohol abuse, uncomplicated: Secondary | ICD-10-CM | POA: Diagnosis not present

## 2021-08-17 DIAGNOSIS — I10 Essential (primary) hypertension: Secondary | ICD-10-CM

## 2021-08-17 LAB — COAGUCHEK XS/INR WAIVED
INR: 2.2 — ABNORMAL HIGH (ref 0.9–1.1)
Prothrombin Time: 26.9 s

## 2021-08-17 NOTE — Patient Instructions (Signed)
Description   INR today is 2.2 (goal 2-3) Perfect!   Continue current  dose of 7.5 mg (1 1/2 tabs)  Tuesday , Thursday, 5 mg (1 tab) all other days.      

## 2021-08-17 NOTE — Progress Notes (Signed)
Subjective:    Patient ID: Jason Spencer, male    DOB: December 24, 1950, 71 y.o.   MRN: [id number]  Chief Complaint  Patient presents with   Anticoagulation   Pt presents to the office today for INR check and chronic follow up. He is currently taking warfarin for A Fib. See anticoagulation flow sheet. Denies any bleeding, missed, extra, or changes in diet.     His INR today is 2.2.  He is currently taking 7.5 mg (1 1/2 tabs)  Tuesday , Thursday, and 5 mg (1 tab) all other days.  See anticoagulation flowsheet.    He does have a hx of alcohol abuse and admits to drinking 4-5  larger beers a day. He is followed by Cardiologists for A Fib, CAD, and Cardiomyopathy.    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 He is currently using antibiotic eye drops. Has follow up tomorrow.    He has COPD and smokes a 1/2 pack and uses a wood stove. He has intermittent SOB and wheezing. He is using Telegy daily.  Hypertension This is a chronic problem. The current episode started more than 1 year ago. The problem has been waxing and waning since onset. The problem is uncontrolled. Associated symptoms include malaise/fatigue and shortness of breath. Pertinent negatives include no peripheral edema. Risk factors for coronary artery disease include dyslipidemia, male gender, obesity and smoking/tobacco exposure. The current treatment provides moderate improvement.     Review of Systems  Constitutional:  Positive for malaise/fatigue.  Respiratory:  Positive for shortness of breath.   All other systems reviewed and are negative.     Objective:   Physical Exam Vitals reviewed.  Constitutional:      General: He is not in acute distress.    Appearance: He is well-developed.  HENT:     Head: Normocephalic.     Right Ear: Tympanic membrane normal.     Left Ear: Tympanic membrane normal.  Neck:     Thyroid: No thyromegaly.  Cardiovascular:     Rate and Rhythm: Normal rate and  regular rhythm.     Heart sounds: Normal heart sounds. No murmur heard. Pulmonary:     Effort: Pulmonary effort is normal. No respiratory distress.     Breath sounds: Normal breath sounds. No wheezing.  Abdominal:     General: Bowel sounds are normal. There is no distension.     Palpations: Abdomen is soft.     Tenderness: There is no abdominal tenderness.  Musculoskeletal:        General: No tenderness. Normal range of motion.     Cervical back: Normal range of motion and neck supple.  Skin:    General: Skin is warm and dry.     Findings: No erythema or rash.  Neurological:     Mental Status: He is alert and oriented to person, place, and time.     Cranial Nerves: No cranial nerve deficit.     Deep Tendon Reflexes: Reflexes are normal and symmetric.  Psychiatric:        Behavior: Behavior normal.        Thought Content: Thought content normal.        Judgment: Judgment normal.    BP (!) 151/77    Pulse 83    Temp (!) 97 F (36.1 C) (Temporal)    Ht 5\' 10"  (1.778 m)    Wt 174 lb 6.4 oz (79.1 kg)    BMI 25.02 kg/m  Assessment & Plan:  Jason Spencer comes in today with chief complaint of Anticoagulation   Diagnosis and orders addressed:  1. Atrial fibrillation, unspecified type [facility]) Description   INR today is 2.2 (goal 2-3) Perfect  Continue current  dose of 7.5 mg (1 1/2 tabs)  Tuesday , Thursday, 5 mg (1 tab) all other days.      - CoaguChek XS/INR Waived  2. Atrial fibrillation with RVR (HCC)  3. Essential hypertension, benign  4. Current use of anticoagulant therapy  5. Alcohol abuse    Health Maintenance reviewed Diet and exercise encouraged  Follow up plan: 1 month    Jason Rodney, FNP

## 2021-09-09 DIAGNOSIS — H25813 Combined forms of age-related cataract, bilateral: Secondary | ICD-10-CM | POA: Diagnosis not present

## 2021-09-09 DIAGNOSIS — S0532XS Ocular laceration without prolapse or loss of intraocular tissue, left eye, sequela: Secondary | ICD-10-CM | POA: Diagnosis not present

## 2021-09-12 ENCOUNTER — Telehealth: Payer: Self-pay | Admitting: Family

## 2021-09-12 NOTE — Telephone Encounter (Signed)
?  Left message for patient to call back and schedule Medicare Annual Wellness Visit (AWV) to be completed by video or phone. ? ?No hx of AWV eligible for AWVI per [facility] as of 05/11/2019 ? ?Please schedule at anytime with [place] --- Karle Starch ? ?52 Minutes appointment  ? ?Any questions, please call me at [phone]   ?

## 2021-09-14 ENCOUNTER — Ambulatory Visit (INDEPENDENT_AMBULATORY_CARE_PROVIDER_SITE_OTHER): Payer: Medicare HMO | Admitting: Family

## 2021-09-14 ENCOUNTER — Encounter: Payer: Self-pay | Admitting: Family

## 2021-09-14 VITALS — BP 122/69 | HR 78 | Temp 96.3°F | Ht 70.0 in | Wt 173.0 lb

## 2021-09-14 DIAGNOSIS — Z720 Tobacco use: Secondary | ICD-10-CM

## 2021-09-14 DIAGNOSIS — I2510 Atherosclerotic heart disease of native coronary artery without angina pectoris: Secondary | ICD-10-CM | POA: Diagnosis not present

## 2021-09-14 DIAGNOSIS — J42 Unspecified chronic bronchitis: Secondary | ICD-10-CM

## 2021-09-14 DIAGNOSIS — I4891 Unspecified atrial fibrillation: Secondary | ICD-10-CM

## 2021-09-14 DIAGNOSIS — F1010 Alcohol abuse, uncomplicated: Secondary | ICD-10-CM | POA: Diagnosis not present

## 2021-09-14 DIAGNOSIS — I10 Essential (primary) hypertension: Secondary | ICD-10-CM

## 2021-09-14 DIAGNOSIS — Z7901 Long term (current) use of anticoagulants: Secondary | ICD-10-CM | POA: Diagnosis not present

## 2021-09-14 LAB — COAGUCHEK XS/INR WAIVED
INR: 3 — ABNORMAL HIGH (ref 0.9–1.1)
Prothrombin Time: 35.7 s

## 2021-09-14 NOTE — Progress Notes (Signed)
? ?Subjective:  ? ? Patient ID: Jason Spencer, male    DOB: [birth year], 71 y.o.   MRN: [id number] ? ?Chief Complaint  ?Patient presents with  ? Anticoagulation  ? ?Pt presents to the office today for INR check and chronic follow up. He is currently taking warfarin for A Fib. See anticoagulation flow sheet. Denies any bleeding, missed, extra, or changes in diet.   ?  ?His INR today is 3.0.  He is currently taking 7.5 mg (1 1/2 tabs)  Tuesday , Thursday, and 5 mg (1 tab) all other days.  See anticoagulation flowsheet.  ?  ?He does have a hx of alcohol abuse and admits to drinking 4-5  larger beers a day. He is followed by Cardiologists for A Fib, CAD, and Cardiomyopathy.  ?  ?He has COPD and smokes a 1/2 pack and uses a wood stove. He has intermittent SOB and wheezing. He is using Telegy daily.  ? ?Hypertension ?This is a chronic problem. The current episode started more than 1 year ago. The problem has been resolved since onset. The problem is controlled. Associated symptoms include shortness of breath. Pertinent negatives include no malaise/fatigue or peripheral edema. Risk factors for coronary artery disease include dyslipidemia, male gender, sedentary lifestyle and smoking/tobacco exposure.  ?Nicotine Dependence ?Presents for follow-up visit. His urge triggers include company of smokers. The symptoms have been stable. He smokes < 1/2 a pack of cigarettes per day.  ? ? ? ?Review of Systems  ?Constitutional:  Negative for malaise/fatigue.  ?Respiratory:  Positive for shortness of breath.   ?All other systems reviewed and are negative. ? ?   ?Objective:  ? Physical Exam ?Vitals reviewed.  ?Constitutional:   ?   General: He is not in acute distress. ?   Appearance: He is well-developed.  ?HENT:  ?   Head: Normocephalic.  ?Eyes:  ?   General:     ?   Right eye: No discharge.     ?   Left eye: No discharge.  ?   Pupils: Pupils are equal, round, and reactive to light.  ?Neck:  ?   Thyroid: No thyromegaly.   ?Cardiovascular:  ?   Rate and Rhythm: Normal rate and regular rhythm.  ?   Heart sounds: Normal heart sounds. No murmur heard. ?Pulmonary:  ?   Effort: Pulmonary effort is normal. No respiratory distress.  ?   Breath sounds: Rhonchi present. No wheezing.  ?Abdominal:  ?   General: Bowel sounds are normal. There is no distension.  ?   Palpations: Abdomen is soft.  ?   Tenderness: There is no abdominal tenderness.  ?Musculoskeletal:     ?   General: No tenderness. Normal range of motion.  ?   Cervical back: Normal range of motion and neck supple.  ?Skin: ?   General: Skin is warm and dry.  ?   Findings: No erythema or rash.  ?Neurological:  ?   Mental Status: He is alert and oriented to person, place, and time.  ?   Cranial Nerves: No cranial nerve deficit.  ?   Deep Tendon Reflexes: Reflexes are normal and symmetric.  ?Psychiatric:     ?   Behavior: Behavior normal.     ?   Thought Content: Thought content normal.     ?   Judgment: Judgment normal.  ? ? ? ? ? ?  BP 122/69   Pulse 78   Temp (!) 96.3 ?F (35.7 ?C) (Temporal)   Ht  5\' 10"  (1.778 m)   Wt 173 lb (78.5 kg)   BMI 24.82 kg/m?  ? ?Assessment & Plan:  ?Ry Moody comes in today with chief complaint of Anticoagulation ? ? ?Diagnosis and orders addressed: ? ?1. Atrial fibrillation, unspecified type (HCC) ?- CoaguChek XS/INR Waived ? ?2. Atrial fibrillation with RVR (HCC) ? ?3. Essential hypertension, benign ? ?4. Chronic bronchitis, unspecified chronic bronchitis type (HCC) ? ?5. Coronary artery disease involving native coronary artery of native heart without angina pectoris ? ?6. Tobacco abuse ? ?7. Current use of anticoagulant therapy ? ? ?8. Alcohol abuse ? ?Description   ?INR today is 3.0 (goal 2-3) Perfect ? ?Continue current  dose of 7.5 mg (1 1/2 tabs)  Tuesday , Thursday, 5 mg (1 tab) all other days.  ?  ? ?Health Maintenance reviewed- Refuses all vaccines ?Diet and exercise encouraged ? ?Follow up plan: ?1 month ? ? ?Monday, FNP ? ? ? ?

## 2021-09-14 NOTE — Patient Instructions (Signed)
Description   ?INR today is 3.0 (goal 2-3) Perfect ? ?Continue current  dose of 7.5 mg (1 1/2 tabs)  Tuesday , Thursday, 5 mg (1 tab) all other days.  ?  ? ? ?

## 2021-10-12 ENCOUNTER — Ambulatory Visit (INDEPENDENT_AMBULATORY_CARE_PROVIDER_SITE_OTHER): Payer: Medicare HMO | Admitting: Family

## 2021-10-12 ENCOUNTER — Encounter: Payer: Self-pay | Admitting: Family

## 2021-10-12 VITALS — BP 124/73 | HR 71 | Temp 97.2°F | Ht 70.0 in | Wt 173.0 lb

## 2021-10-12 DIAGNOSIS — F1010 Alcohol abuse, uncomplicated: Secondary | ICD-10-CM

## 2021-10-12 DIAGNOSIS — I10 Essential (primary) hypertension: Secondary | ICD-10-CM

## 2021-10-12 DIAGNOSIS — J42 Unspecified chronic bronchitis: Secondary | ICD-10-CM | POA: Diagnosis not present

## 2021-10-12 DIAGNOSIS — I4891 Unspecified atrial fibrillation: Secondary | ICD-10-CM | POA: Diagnosis not present

## 2021-10-12 DIAGNOSIS — Z720 Tobacco use: Secondary | ICD-10-CM

## 2021-10-12 LAB — COAGUCHEK XS/INR WAIVED
INR: 1.7 — ABNORMAL HIGH (ref 0.9–1.1)
Prothrombin Time: 20.8 s

## 2021-10-12 NOTE — Progress Notes (Signed)
? ?Subjective:  ? ? Patient ID: Jason Spencer, male    DOB: 03/06/1951, 71 y.o.   MRN: [id number] ? ?Chief Complaint  ?Patient presents with  ? Anticoagulation  ? ?Pt presents to the office today for INR check and chronic follow up. He is currently taking warfarin for A Fib. See anticoagulation flow sheet. Denies any bleeding, missed, extra, or changes in diet.   ?  ?His INR today is 1.7.  He is currently taking 7.5 mg (1 1/2 tabs)  Tuesday , Thursday, and 5 mg (1 tab) all other days.  See anticoagulation flowsheet.  ?  ?He does have a hx of alcohol abuse and admits to drinking 4-5  larger beers a day. He is followed by Cardiologists for A Fib, CAD, and Cardiomyopathy.  ?  ?He has COPD and smokes a 1/2 pack and uses a wood stove. He has intermittent SOB and wheezing. He is using Telegy daily.  ?Hypertension ?This is a chronic problem. The current episode started more than 1 year ago. The problem has been waxing and waning since onset. The problem is uncontrolled. Associated symptoms include malaise/fatigue and shortness of breath. Pertinent negatives include no peripheral edema. Risk factors for coronary artery disease include dyslipidemia, male gender, smoking/tobacco exposure and sedentary lifestyle.  ? ? ? ?Review of Systems  ?Constitutional:  Positive for malaise/fatigue.  ?Respiratory:  Positive for shortness of breath.   ?All other systems reviewed and are negative. ? ?   ?Objective:  ? Physical Exam ?Vitals reviewed.  ?Constitutional:   ?   General: He is not in acute distress. ?   Appearance: He is well-developed.  ?HENT:  ?   Head: Normocephalic.  ?Eyes:  ?   General:     ?   Right eye: No discharge.     ?   Left eye: No discharge.  ?   Pupils: Pupils are equal, round, and reactive to light.  ?Neck:  ?   Thyroid: No thyromegaly.  ?Cardiovascular:  ?   Rate and Rhythm: Normal rate and regular rhythm.  ?   Heart sounds: Normal heart sounds. No murmur heard. ?Pulmonary:  ?   Effort: Pulmonary effort is  normal. No respiratory distress.  ?   Breath sounds: Wheezing and rhonchi present.  ?Abdominal:  ?   General: Bowel sounds are normal. There is no distension.  ?   Palpations: Abdomen is soft.  ?   Tenderness: There is no abdominal tenderness.  ?Musculoskeletal:     ?   General: No tenderness. Normal range of motion.  ?   Cervical back: Normal range of motion and neck supple.  ?Skin: ?   General: Skin is warm and dry.  ?   Findings: No erythema or rash.  ?Neurological:  ?   Mental Status: He is alert and oriented to person, place, and time.  ?   Cranial Nerves: No cranial nerve deficit.  ?   Deep Tendon Reflexes: Reflexes are normal and symmetric.  ?Psychiatric:     ?   Behavior: Behavior normal.     ?   Thought Content: Thought content normal.     ?   Judgment: Judgment normal.  ? ? ? ?BP 124/73   Pulse 71   Temp (!) 97.2 ?F (36.2 ?C) (Temporal)   Ht 5\' 10"  (1.778 m)   Wt 173 lb (78.5 kg)   BMI 24.82 kg/m?  ? ? ?   ?Assessment & Plan:  ?Jason Spencer comes in today with  chief complaint of Anticoagulation ? ? ?Diagnosis and orders addressed: ? ?1. Atrial fibrillation, unspecified type (HCC) ?Description   ?INR today is 1.7 (goal 2-3) too thick ? ?Increase to 7.5 mg today 10/12/21, then Continue current  dose of 7.5 mg (1 1/2 tabs)  Tuesday , Thursday, 5 mg (1 tab) all other days.  ?  ? ?- CoaguChek XS/INR Waived ? ?2. Atrial fibrillation with RVR (HCC) ? ?3. Essential hypertension, benign ? ?4. Chronic bronchitis, unspecified chronic bronchitis type (HCC) ? ?5. Tobacco abuse ? ?6. Alcohol abuse ? ? ?Encourage decreasing alcohol and tobacco- not interested at this time ?Health Maintenance reviewed- Refused  ?Diet and exercise encouraged ? ?Follow up plan: ?1 weeks  ? ? ?Jannifer Rodney, FNP ? ? ? ?

## 2021-10-12 NOTE — Patient Instructions (Signed)
Description   ?INR today is 1.7 (goal 2-3) Perfect ? ?Increase to 7.5 mg today 10/12/21, then Continue current  dose of 7.5 mg (1 1/2 tabs)  Tuesday , Thursday, 5 mg (1 tab) all other days.  ?  ? ? ?

## 2021-10-25 ENCOUNTER — Ambulatory Visit: Payer: Medicare HMO | Admitting: Family

## 2021-10-25 ENCOUNTER — Encounter: Payer: Self-pay | Admitting: Family

## 2021-11-08 ENCOUNTER — Telehealth: Payer: Self-pay | Admitting: Family

## 2021-11-08 NOTE — Telephone Encounter (Signed)
?  Left message for patient to call back and schedule Medicare Annual Wellness Visit (AWV) to be completed by video or phone. ? ?No hx of AWV eligible for AWVI per [facility] as of  05/11/2019 ? ?Please schedule at anytime with [place] ? ?45 Minutes appointment  ? ?Any questions, please call me at [phone]   ?

## 2021-11-22 ENCOUNTER — Ambulatory Visit (INDEPENDENT_AMBULATORY_CARE_PROVIDER_SITE_OTHER): Payer: Medicare HMO | Admitting: Family

## 2021-11-22 ENCOUNTER — Encounter: Payer: Self-pay | Admitting: Family

## 2021-11-22 VITALS — BP 138/77 | HR 74 | Temp 97.3°F | Ht 70.0 in | Wt 172.1 lb

## 2021-11-22 DIAGNOSIS — Z720 Tobacco use: Secondary | ICD-10-CM | POA: Diagnosis not present

## 2021-11-22 DIAGNOSIS — I5023 Acute on chronic systolic (congestive) heart failure: Secondary | ICD-10-CM

## 2021-11-22 DIAGNOSIS — Z0000 Encounter for general adult medical examination without abnormal findings: Secondary | ICD-10-CM

## 2021-11-22 DIAGNOSIS — I2510 Atherosclerotic heart disease of native coronary artery without angina pectoris: Secondary | ICD-10-CM

## 2021-11-22 DIAGNOSIS — Z125 Encounter for screening for malignant neoplasm of prostate: Secondary | ICD-10-CM | POA: Diagnosis not present

## 2021-11-22 DIAGNOSIS — Z0001 Encounter for general adult medical examination with abnormal findings: Secondary | ICD-10-CM

## 2021-11-22 DIAGNOSIS — I429 Cardiomyopathy, unspecified: Secondary | ICD-10-CM

## 2021-11-22 DIAGNOSIS — J42 Unspecified chronic bronchitis: Secondary | ICD-10-CM | POA: Diagnosis not present

## 2021-11-22 DIAGNOSIS — I4891 Unspecified atrial fibrillation: Secondary | ICD-10-CM

## 2021-11-22 DIAGNOSIS — E785 Hyperlipidemia, unspecified: Secondary | ICD-10-CM

## 2021-11-22 DIAGNOSIS — I10 Essential (primary) hypertension: Secondary | ICD-10-CM | POA: Diagnosis not present

## 2021-11-22 DIAGNOSIS — Z7901 Long term (current) use of anticoagulants: Secondary | ICD-10-CM

## 2021-11-22 DIAGNOSIS — F1010 Alcohol abuse, uncomplicated: Secondary | ICD-10-CM

## 2021-11-22 LAB — COAGUCHEK XS/INR WAIVED
INR: 2.9 — ABNORMAL HIGH (ref 0.9–1.1)
Prothrombin Time: 34.3 s

## 2021-11-22 NOTE — Patient Instructions (Signed)
Description   ?INR today is 2.9 (goal 2-3) too thick ? ?Continue current  dose of 7.5 mg (1 1/2 tabs)  Tuesday , Thursday, 5 mg (1 tab) all other days.  ?  ? ? ?

## 2021-11-22 NOTE — Progress Notes (Signed)
? ?Subjective:  ? ? Patient ID: Jason Spencer, male    DOB: 1950/12/27, 71 y.o.   MRN: [id number] ? ?Chief Complaint  ?Patient presents with  ? Atrial Fibrillation  ? ?Pt presents to the office today for INR check and chronic follow up. He is currently taking warfarin for A Fib. See anticoagulation flow sheet. Denies any bleeding, missed, extra, or changes in diet.   ?  ?His INR today is 2.9.  He is currently taking 7.5 mg (1 1/2 tabs)  Tuesday , Thursday, and 5 mg (1 tab) all other days.  See anticoagulation flowsheet.  ?  ?He does have a hx of alcohol abuse and admits to drinking 4-5  larger beers a day. He is followed by Cardiologists for A Fib, CAD, and Cardiomyopathy.  ?  ?He has COPD and smokes a 1/2 pack and uses a wood stove. He has intermittent SOB and wheezing. He is using Telegy daily.  ?Atrial Fibrillation ?Presents for follow-up visit. Symptoms include hypertension. Symptoms are negative for bradycardia and shortness of breath. The symptoms have been stable. Past medical history includes atrial fibrillation and hyperlipidemia.  ?Hypertension ?This is a chronic problem. The current episode started more than 1 year ago. The problem has been resolved since onset. The problem is controlled. Associated symptoms include malaise/fatigue. Pertinent negatives include no peripheral edema or shortness of breath. Risk factors for coronary artery disease include dyslipidemia, obesity and male gender. The current treatment provides moderate improvement.  ?Nicotine Dependence ?Presents for follow-up visit. His urge triggers include company of smokers. The symptoms have been stable. He smokes < 1/2 a pack of cigarettes per day.  ?Hyperlipidemia ?This is a chronic problem. The current episode started more than 1 year ago. The problem is controlled. Pertinent negatives include no shortness of breath. Current antihyperlipidemic treatment includes statins. The current treatment provides moderate improvement of lipids.  Risk factors for coronary artery disease include hypertension, post-menopausal, dyslipidemia and male sex.  ? ? ? ?Review of Systems  ?Constitutional:  Positive for malaise/fatigue.  ?Respiratory:  Negative for shortness of breath.   ?All other systems reviewed and are negative. ? ?Family History  ?Problem Relation Age of Onset  ? Heart disease Mother   ?     VALUE REPLACEMENT  ? COPD Father   ? Aneurysm Father   ? ?Social History  ? ?Socioeconomic History  ? Marital status: Single  ?  Spouse name: Not on file  ? Number of children: Not on file  ? Years of education: Not on file  ? Highest education level: Not on file  ?Occupational History  ? Not on file  ?Tobacco Use  ? Smoking status: Every Day  ?  Packs/day: 0.50  ?  Years: 44.00  ?  Pack years: 22.00  ?  Types: Cigarettes  ? Smokeless tobacco: Never  ?Vaping Use  ? Vaping Use: Never used  ?Substance and Sexual Activity  ? Alcohol use: Yes  ?  Alcohol/week: 0.0 standard drinks  ?  Comment: 08/13/2014 "I haven't drank in a month or so; if I drink it would be beer on a warm day"  ? Drug use: No  ? Sexual activity: Not Currently  ?Other Topics Concern  ? Not on file  ?Social History Narrative  ? Not on file  ? ?Social Determinants of Health  ? ?Financial Resource Strain: Not on file  ?Food Insecurity: Not on file  ?Transportation Needs: Not on file  ?Physical Activity: Not on file  ?Stress:  Not on file  ?Social Connections: Not on file  ? ? ?   ?Objective:  ? Physical Exam ?Vitals reviewed.  ?Constitutional:   ?   General: He is not in acute distress. ?   Appearance: He is well-developed.  ?HENT:  ?   Head: Normocephalic.  ?   Right Ear: External ear normal.  ?   Left Ear: External ear normal.  ?Eyes:  ?   General:     ?   Right eye: No discharge.     ?   Left eye: No discharge.  ?   Pupils: Pupils are equal, round, and reactive to light.  ?Neck:  ?   Thyroid: No thyromegaly.  ?Cardiovascular:  ?   Rate and Rhythm: Normal rate and regular rhythm.  ?   Heart sounds:  Normal heart sounds. No murmur heard. ?Pulmonary:  ?   Effort: Pulmonary effort is normal. No respiratory distress.  ?   Breath sounds: Wheezing present.  ?Abdominal:  ?   General: Bowel sounds are normal. There is no distension.  ?   Palpations: Abdomen is soft.  ?   Tenderness: There is no abdominal tenderness.  ?Musculoskeletal:     ?   General: No tenderness. Normal range of motion.  ?   Cervical back: Normal range of motion and neck supple.  ?Skin: ?   General: Skin is warm and dry.  ?   Findings: No erythema or rash.  ?Neurological:  ?   Mental Status: He is alert and oriented to person, place, and time.  ?   Cranial Nerves: No cranial nerve deficit.  ?   Deep Tendon Reflexes: Reflexes are normal and symmetric.  ?Psychiatric:     ?   Behavior: Behavior normal.     ?   Thought Content: Thought content normal.     ?   Judgment: Judgment normal.  ? ? ? ? ?BP 138/77   Pulse 74   Temp (!) 97.3 ?F (36.3 ?C) (Temporal)   Ht _0  (1.778 m)   Wt 172 lb 2 oz (78.1 kg)   SpO2 98%   BMI 24.70 kg/m?  ? ?   ?Assessment & Plan:  ?Kenta Laster comes in today with chief complaint of Atrial Fibrillation ? ? ?Diagnosis and orders addressed: ? ?1. Atrial fibrillation, unspecified type ([place]) ?Description   ?INR today is 2.9 (goal 2-3) too thick ? ?Continue current  dose of 7.5 mg (1 1/2 tabs)  Tuesday , Thursday, 5 mg (1 tab) all other days.  ?  ? ?- CoaguChek XS/INR Waived ?- CMP14+EGFR ?- CBC with Differential/Platelet ? ?2. Annual physical exam ?- CMP14+EGFR ?- CBC with Differential/Platelet ?- Lipid panel ?- PSA, total and free ?- TSH ? ?3. Essential hypertension, benign ?- CMP14+EGFR ?- CBC with Differential/Platelet ? ?4. Cardiomyopathy, unspecified type ([place]) ?- CMP14+EGFR ?- CBC with Differential/Platelet ? ?5. Coronary artery disease involving native coronary artery of native heart without angina pectoris ?- CMP14+EGFR ?- CBC with Differential/Platelet ? ?6. Acute on chronic systolic CHF (congestive heart  failure) ([place]) ?- CMP14+EGFR ?- CBC with Differential/Platelet ? ?7. Chronic bronchitis, unspecified chronic bronchitis type ([place]) ?- CMP14+EGFR ?- CBC with Differential/Platelet ? ?8. Tobacco abuse ?- CMP14+EGFR ?- CBC with Differential/Platelet ? ?9. Hyperlipidemia, unspecified hyperlipidemia type ?- CMP14+EGFR ?- CBC with Differential/Platelet ?- Lipid panel ? ?10. Alcohol abuse ?- CMP14+EGFR ?- CBC with Differential/Platelet ? ?11. Current use of anticoagulant therapy ?- CMP14+EGFR ?- CBC with Differential/Platelet ? ?12. Atrial fibrillation with  RVR ([place]) ? ? ?Labs pending ?Health Maintenance reviewed ?Diet and exercise encouraged ? ?Follow up plan: ?1 month ? ? ?[name], FNP ? ? ? ?

## 2021-11-23 LAB — LIPID PANEL
Chol/HDL Ratio: 2.3 ratio (ref 0.0–5.0)
Cholesterol, Total: 207 mg/dL — ABNORMAL HIGH (ref 100–199)
HDL: 89 mg/dL (ref >39)
LDL Chol Calc (NIH): 92 mg/dL (ref 0–99)
Triglycerides: 158 mg/dL — ABNORMAL HIGH (ref 0–149)
VLDL Cholesterol Cal: 26 mg/dL (ref 5–40)

## 2021-11-23 LAB — CMP14+EGFR
ALT: 14 IU/L (ref 0–44)
AST: 31 IU/L (ref 0–40)
Albumin/Globulin Ratio: 1.1 — ABNORMAL LOW (ref 1.2–2.2)
Albumin: 3.9 g/dL (ref 3.8–4.8)
Alkaline Phosphatase: 68 IU/L (ref 44–121)
BUN/Creatinine Ratio: 5 — ABNORMAL LOW (ref 10–24)
BUN: 7 mg/dL — ABNORMAL LOW (ref 8–27)
Bilirubin Total: 0.6 mg/dL (ref 0.0–1.2)
CO2: 25 mmol/L (ref 20–29)
Calcium: 9.2 mg/dL (ref 8.6–10.2)
Chloride: 96 mmol/L (ref 96–106)
Creatinine, Ser: 1.4 mg/dL — ABNORMAL HIGH (ref 0.76–1.27)
Globulin, Total: 3.5 g/dL (ref 1.5–4.5)
Glucose: 91 mg/dL (ref 70–99)
Potassium: 4.6 mmol/L (ref 3.5–5.2)
Sodium: 134 mmol/L (ref 134–144)
Total Protein: 7.4 g/dL (ref 6.0–8.5)
eGFR: 55 mL/min/{1.73_m2} — ABNORMAL LOW (ref >59)

## 2021-11-23 LAB — CBC WITH DIFFERENTIAL/PLATELET
Basophils Absolute: 0 10*3/uL (ref 0.0–0.2)
Basos: 1 %
EOS (ABSOLUTE): 0.1 10*3/uL (ref 0.0–0.4)
Eos: 2 %
Hematocrit: 45.8 % (ref 37.5–51.0)
Hemoglobin: 15.8 g/dL (ref 13.0–17.7)
Immature Grans (Abs): 0.1 10*3/uL (ref 0.0–0.1)
Immature Granulocytes: 1 %
Lymphocytes Absolute: 1.1 10*3/uL (ref 0.7–3.1)
Lymphs: 14 %
MCH: 34.8 pg — ABNORMAL HIGH (ref 26.6–33.0)
MCHC: 34.5 g/dL (ref 31.5–35.7)
MCV: 101 fL — ABNORMAL HIGH (ref 79–97)
Monocytes Absolute: 1.2 10*3/uL — ABNORMAL HIGH (ref 0.1–0.9)
Monocytes: 16 %
Neutrophils Absolute: 4.9 10*3/uL (ref 1.4–7.0)
Neutrophils: 66 %
Platelets: 200 10*3/uL (ref 150–450)
RBC: 4.54 x10E6/uL (ref 4.14–5.80)
RDW: 12.5 % (ref 11.6–15.4)
WBC: 7.4 10*3/uL (ref 3.4–10.8)

## 2021-11-23 LAB — PSA, TOTAL AND FREE
PSA, Free Pct: 30 %
PSA, Free: 0.21 ng/mL
Prostate Specific Ag, Serum: 0.7 ng/mL (ref 0.0–4.0)

## 2021-11-23 LAB — TSH: TSH: 3.5 u[IU]/mL (ref 0.450–4.500)

## 2021-11-25 ENCOUNTER — Other Ambulatory Visit: Payer: Self-pay | Admitting: Family

## 2021-11-28 ENCOUNTER — Encounter: Payer: Self-pay | Admitting: Emergency Medicine

## 2021-12-06 ENCOUNTER — Telehealth: Payer: Self-pay | Admitting: Family

## 2021-12-06 DIAGNOSIS — I10 Essential (primary) hypertension: Secondary | ICD-10-CM

## 2021-12-06 MED ORDER — LISINOPRIL 40 MG PO TABS: 40.0000 mg | ORAL_TABLET | Freq: Every day | ORAL | 0 refills | Status: DC

## 2021-12-06 NOTE — Telephone Encounter (Signed)
Rx sent. Patient aware.  

## 2021-12-06 NOTE — Telephone Encounter (Signed)
  Prescription Request  12/06/2021  Is this a "Controlled Substance" medicine? no  Have you seen your PCP in the last 2 weeks? 11/22/21  If YES, route message to pool  -  If NO, patient needs to be scheduled for appointment.  What is the name of the medication or equipment? lisinipril  Have you contacted your pharmacy to request a refill? yes   Which pharmacy would you like this sent to? [facility]   Patient notified that their request is being sent to the clinical staff for review and that they should receive a response within 2 business days.

## 2021-12-19 ENCOUNTER — Ambulatory Visit: Payer: Medicare HMO | Admitting: Family

## 2021-12-19 ENCOUNTER — Encounter: Payer: Self-pay | Admitting: Family

## 2022-01-02 ENCOUNTER — Ambulatory Visit (INDEPENDENT_AMBULATORY_CARE_PROVIDER_SITE_OTHER): Payer: Medicare HMO | Admitting: Family

## 2022-01-02 ENCOUNTER — Encounter: Payer: Self-pay | Admitting: Family

## 2022-01-02 VITALS — BP 146/83 | HR 88 | Temp 98.0°F | Ht 70.0 in | Wt 175.2 lb

## 2022-01-02 DIAGNOSIS — Z720 Tobacco use: Secondary | ICD-10-CM

## 2022-01-02 DIAGNOSIS — F1010 Alcohol abuse, uncomplicated: Secondary | ICD-10-CM

## 2022-01-02 DIAGNOSIS — I5023 Acute on chronic systolic (congestive) heart failure: Secondary | ICD-10-CM | POA: Diagnosis not present

## 2022-01-02 DIAGNOSIS — I10 Essential (primary) hypertension: Secondary | ICD-10-CM | POA: Diagnosis not present

## 2022-01-02 DIAGNOSIS — Z23 Encounter for immunization: Secondary | ICD-10-CM | POA: Diagnosis not present

## 2022-01-02 DIAGNOSIS — I4891 Unspecified atrial fibrillation: Secondary | ICD-10-CM

## 2022-01-02 DIAGNOSIS — I2510 Atherosclerotic heart disease of native coronary artery without angina pectoris: Secondary | ICD-10-CM

## 2022-01-02 DIAGNOSIS — J42 Unspecified chronic bronchitis: Secondary | ICD-10-CM | POA: Diagnosis not present

## 2022-01-02 DIAGNOSIS — Z7901 Long term (current) use of anticoagulants: Secondary | ICD-10-CM

## 2022-01-02 DIAGNOSIS — E785 Hyperlipidemia, unspecified: Secondary | ICD-10-CM

## 2022-01-02 DIAGNOSIS — F411 Generalized anxiety disorder: Secondary | ICD-10-CM

## 2022-01-02 LAB — COAGUCHEK XS/INR WAIVED
INR: 2 — ABNORMAL HIGH (ref 0.9–1.1)
Prothrombin Time: 24.3 s

## 2022-01-02 MED ORDER — TRELEGY ELLIPTA 100-62.5-25 MCG/ACT IN AEPB: 1.0000 | INHALATION_SPRAY | Freq: Every day | RESPIRATORY_TRACT | 11 refills | Status: DC

## 2022-01-05 DIAGNOSIS — H524 Presbyopia: Secondary | ICD-10-CM | POA: Diagnosis not present

## 2022-01-06 DIAGNOSIS — H5213 Myopia, bilateral: Secondary | ICD-10-CM | POA: Diagnosis not present

## 2022-01-16 ENCOUNTER — Telehealth: Payer: Self-pay | Admitting: Family

## 2022-01-16 DIAGNOSIS — I2510 Atherosclerotic heart disease of native coronary artery without angina pectoris: Secondary | ICD-10-CM

## 2022-01-16 DIAGNOSIS — I4891 Unspecified atrial fibrillation: Secondary | ICD-10-CM

## 2022-01-16 MED ORDER — WARFARIN SODIUM 5 MG PO TABS: ORAL_TABLET | ORAL | 2 refills | Status: DC

## 2022-01-16 NOTE — Telephone Encounter (Signed)
  Prescription Request  01/16/2022  Is this a "Controlled Substance" medicine? Not sure  Have you seen your PCP in the last 2 weeks? Not sure  If YES, route message to pool  -  If NO, patient needs to be scheduled for appointment.  What is the name of the medication or equipment? Warfarin  Have you contacted your pharmacy to request a refill? no   Which pharmacy would you like this sent to? [facility]   Patient notified that their request is being sent to the clinical staff for review and that they should receive a response within 2 business days.

## 2022-01-16 NOTE — Telephone Encounter (Signed)
Prescription sent to pharmacy.

## 2022-01-30 ENCOUNTER — Encounter: Payer: Self-pay | Admitting: Family

## 2022-01-30 ENCOUNTER — Ambulatory Visit (INDEPENDENT_AMBULATORY_CARE_PROVIDER_SITE_OTHER): Payer: Medicare HMO | Admitting: Family

## 2022-01-30 VITALS — BP 125/67 | HR 68 | Temp 97.5°F | Ht 70.0 in | Wt 176.2 lb

## 2022-01-30 DIAGNOSIS — I10 Essential (primary) hypertension: Secondary | ICD-10-CM | POA: Diagnosis not present

## 2022-01-30 DIAGNOSIS — I4892 Unspecified atrial flutter: Secondary | ICD-10-CM

## 2022-01-30 DIAGNOSIS — Z7901 Long term (current) use of anticoagulants: Secondary | ICD-10-CM

## 2022-01-30 DIAGNOSIS — J441 Chronic obstructive pulmonary disease with (acute) exacerbation: Secondary | ICD-10-CM

## 2022-01-30 DIAGNOSIS — J42 Unspecified chronic bronchitis: Secondary | ICD-10-CM | POA: Diagnosis not present

## 2022-01-30 DIAGNOSIS — I4891 Unspecified atrial fibrillation: Secondary | ICD-10-CM | POA: Diagnosis not present

## 2022-01-30 DIAGNOSIS — D692 Other nonthrombocytopenic purpura: Secondary | ICD-10-CM | POA: Diagnosis not present

## 2022-01-30 LAB — COAGUCHEK XS/INR WAIVED
INR: 2.9 — ABNORMAL HIGH (ref 0.9–1.1)
Prothrombin Time: 35 s

## 2022-01-30 MED ORDER — TRELEGY ELLIPTA 100-62.5-25 MCG/ACT IN AEPB: 1.0000 | INHALATION_SPRAY | Freq: Every day | RESPIRATORY_TRACT | 11 refills | Status: DC

## 2022-01-30 NOTE — Progress Notes (Signed)
Subjective:    Patient ID: Jason Spencer, male    DOB: [birth year], 71 y.o.   MRN: [id number]  Chief Complaint  Patient presents with   Medical Management of Chronic Issues   Pt presents to the office today for INR check and chronic follow up. He is currently taking warfarin for A Fib. See anticoagulation flow sheet. Denies any bleeding, missed, extra, or changes in diet.  Has purpura senilis.    His INR today is 2.9.  He is currently taking 7.5 mg (1 1/2 tabs)  Tuesday , Thursday, and 5 mg (1 tab) all other days.  See anticoagulation flowsheet.    He does have a hx of alcohol abuse and admits to drinking 4-5  larger beers a day. He is followed by Cardiologists for A Fib, CAD, and Cardiomyopathy.    He has COPD and smokes a 1/2 pack and uses a wood stove. He has intermittent SOB and wheezing. He has not used his Telegy recently.  Hypertension This is a chronic problem. The current episode started more than 1 year ago. The problem has been resolved since onset. The problem is controlled. Associated symptoms include malaise/fatigue and shortness of breath. Pertinent negatives include no peripheral edema. The current treatment provides moderate improvement.  Cough This is a chronic problem. The current episode started more than 1 year ago. The problem has been waxing and waning. The cough is Non-productive. Associated symptoms include shortness of breath. He has tried rest and OTC cough suppressant for the symptoms. The treatment provided mild relief. His past medical history is significant for COPD.      Review of Systems  Constitutional:  Positive for malaise/fatigue.  Respiratory:  Positive for cough and shortness of breath.   All other systems reviewed and are negative.      Objective:   Physical Exam Vitals reviewed.  Constitutional:      General: He is not in acute distress.    Appearance: He is well-developed.  HENT:     Head: Normocephalic.     Right Ear: Tympanic  membrane normal.     Left Ear: Tympanic membrane normal.  Eyes:     General:        Right eye: No discharge.        Left eye: No discharge.     Pupils: Pupils are equal, round, and reactive to light.  Neck:     Thyroid: No thyromegaly.  Cardiovascular:     Rate and Rhythm: Normal rate and regular rhythm.     Heart sounds: Normal heart sounds. No murmur heard. Pulmonary:     Effort: Pulmonary effort is normal. No respiratory distress.     Breath sounds: Wheezing and rhonchi present.  Abdominal:     General: Bowel sounds are normal. There is no distension.     Palpations: Abdomen is soft.     Tenderness: There is no abdominal tenderness.  Musculoskeletal:        General: No tenderness. Normal range of motion.     Cervical back: Normal range of motion and neck supple.  Skin:    General: Skin is warm and dry.     Findings: Bruising present. No erythema or rash.  Neurological:     Mental Status: He is alert and oriented to person, place, and time.     Cranial Nerves: No cranial nerve deficit.     Deep Tendon Reflexes: Reflexes are normal and symmetric.  Psychiatric:  Behavior: Behavior normal.        Thought Content: Thought content normal.        Judgment: Judgment normal.       BP 125/67   Pulse 68   Temp (!) 97.5 F (36.4 C)   Ht 5\' 10"  (1.778 m)   Wt 176 lb 3.2 oz (79.9 kg)   SpO2 96%   BMI 25.28 kg/m      Assessment & Plan:  Jason Spencer comes in today with chief complaint of Medical Management of Chronic Issues   Diagnosis and orders addressed: 1. Atrial flutter, unspecified type (HCC) - CoaguChek XS/INR Waived  2. Atrial fibrillation with RVR (HCC) Description   INR today is 2.9 (goal 2-3) At goal!  Continue current  dose of 7.5 mg (1 1/2 tabs)  Tuesday , Thursday, 5 mg (1 tab) all other days.       3. Essential hypertension, benign  4. COPD exacerbation (HCC) Restart Trelegy daily If wheezing continues may need steroids -  Fluticasone-Umeclidin-Vilant (TRELEGY ELLIPTA) 100-62.5-25 MCG/ACT AEPB; Inhale 1 puff into the lungs daily.  Dispense: 1 each; Refill: 11  5. Current use of anticoagulant therapy  6. Chronic bronchitis, unspecified chronic bronchitis type (HCC) - Fluticasone-Umeclidin-Vilant (TRELEGY ELLIPTA) 100-62.5-25 MCG/ACT AEPB; Inhale 1 puff into the lungs daily.  Dispense: 1 each; Refill: 11  7. Purpura senilis (HCC)    Labs pending Health Maintenance reviewed Diet and exercise encouraged  Follow up plan: 1 month   08-08-1985, FNP

## 2022-01-30 NOTE — Patient Instructions (Signed)
Description   INR today is 2.9 (goal 2-3) At goal!  Continue current  dose of 7.5 mg (1 1/2 tabs)  Tuesday , Thursday, 5 mg (1 tab) all other days.

## 2022-02-24 ENCOUNTER — Other Ambulatory Visit: Payer: Self-pay | Admitting: Family

## 2022-02-24 DIAGNOSIS — I10 Essential (primary) hypertension: Secondary | ICD-10-CM

## 2022-02-24 DIAGNOSIS — I4891 Unspecified atrial fibrillation: Secondary | ICD-10-CM

## 2022-03-02 ENCOUNTER — Encounter: Payer: Self-pay | Admitting: Family

## 2022-03-02 ENCOUNTER — Ambulatory Visit (INDEPENDENT_AMBULATORY_CARE_PROVIDER_SITE_OTHER): Payer: Medicare HMO | Admitting: Family

## 2022-03-02 VITALS — BP 142/74 | HR 76 | Temp 97.4°F | Ht 70.0 in | Wt 173.0 lb

## 2022-03-02 DIAGNOSIS — J42 Unspecified chronic bronchitis: Secondary | ICD-10-CM | POA: Diagnosis not present

## 2022-03-02 DIAGNOSIS — I429 Cardiomyopathy, unspecified: Secondary | ICD-10-CM | POA: Diagnosis not present

## 2022-03-02 DIAGNOSIS — F1010 Alcohol abuse, uncomplicated: Secondary | ICD-10-CM

## 2022-03-02 DIAGNOSIS — I5023 Acute on chronic systolic (congestive) heart failure: Secondary | ICD-10-CM | POA: Diagnosis not present

## 2022-03-02 DIAGNOSIS — I4891 Unspecified atrial fibrillation: Secondary | ICD-10-CM | POA: Diagnosis not present

## 2022-03-02 DIAGNOSIS — I700 Atherosclerosis of aorta: Secondary | ICD-10-CM

## 2022-03-02 DIAGNOSIS — Z7901 Long term (current) use of anticoagulants: Secondary | ICD-10-CM

## 2022-03-02 DIAGNOSIS — I10 Essential (primary) hypertension: Secondary | ICD-10-CM | POA: Diagnosis not present

## 2022-03-02 DIAGNOSIS — E785 Hyperlipidemia, unspecified: Secondary | ICD-10-CM

## 2022-03-02 DIAGNOSIS — Z0001 Encounter for general adult medical examination with abnormal findings: Secondary | ICD-10-CM

## 2022-03-02 DIAGNOSIS — Z0000 Encounter for general adult medical examination without abnormal findings: Secondary | ICD-10-CM

## 2022-03-02 DIAGNOSIS — F411 Generalized anxiety disorder: Secondary | ICD-10-CM

## 2022-03-02 DIAGNOSIS — D692 Other nonthrombocytopenic purpura: Secondary | ICD-10-CM

## 2022-03-02 DIAGNOSIS — Z720 Tobacco use: Secondary | ICD-10-CM

## 2022-03-02 LAB — COAGUCHEK XS/INR WAIVED
INR: 2.5 — ABNORMAL HIGH (ref 0.9–1.1)
Prothrombin Time: 29.8 s

## 2022-03-02 MED ORDER — METOPROLOL SUCCINATE ER 100 MG PO TB24: ORAL_TABLET | ORAL | 2 refills | Status: DC

## 2022-03-02 MED ORDER — ROSUVASTATIN CALCIUM 5 MG PO TABS: 5.0000 mg | ORAL_TABLET | Freq: Every day | ORAL | 3 refills | Status: DC

## 2022-03-02 NOTE — Progress Notes (Signed)
Subjective:    Patient ID: Jason Spencer, male    DOB: December 19, 1950, 71 y.o.   MRN: [id number]  Chief Complaint  Patient presents with   Anticoagulation   Pt presents to the office today for  CPE and INR check and chronic follow up. He is currently taking warfarin for A Fib. See anticoagulation flow sheet. Denies any bleeding, missed, extra, or changes in diet.  Has purpura senilis.    His INR today is 2.5.  He is currently taking 7.5 mg (1 1/2 tabs)  Tuesday , Thursday, and 5 mg (1 tab) all other days.  See anticoagulation flowsheet.    He does have a hx of alcohol abuse and admits to drinking 4-5  larger beers a day. He is followed by Cardiologists for A Fib, CAD, and Cardiomyopathy.    He has COPD and smokes a 1/2 pack and uses a wood stove. He has intermittent SOB and wheezing. He has not used his Telegy recently.   He has purpura senilis and takes warfarin. Stable.  Hypertension This is a chronic problem. The current episode started more than 1 year ago. The problem has been resolved since onset. The problem is controlled. Associated symptoms include malaise/fatigue and shortness of breath. Pertinent negatives include no peripheral edema. Risk factors for coronary artery disease include dyslipidemia, obesity and male gender. The current treatment provides moderate improvement.  Congestive Heart Failure Presents for follow-up visit. Associated symptoms include fatigue and shortness of breath. Pertinent negatives include no edema. The symptoms have been stable.      Review of Systems  Constitutional:  Positive for fatigue and malaise/fatigue.  Respiratory:  Positive for shortness of breath.   All other systems reviewed and are negative.  Family History  Problem Relation Age of Onset   Heart disease Mother        VALUE REPLACEMENT   COPD Father    Aneurysm Father    Social History   Socioeconomic History   Marital status: Single    Spouse name: Not on file   Number of  children: Not on file   Years of education: Not on file   Highest education level: Not on file  Occupational History   Not on file  Tobacco Use   Smoking status: Every Day    Packs/day: 0.50    Years: 44.00    Total pack years: 22.00    Types: Cigarettes   Smokeless tobacco: Never  Vaping Use   Vaping Use: Never used  Substance and Sexual Activity   Alcohol use: Yes    Alcohol/week: 0.0 standard drinks of alcohol    Comment: 08/13/2014 "I haven't drank in a month or so; if I drink it would be beer on a warm day"   Drug use: No   Sexual activity: Not Currently  Other Topics Concern   Not on file  Social History Narrative   Not on file   Social Determinants of Health   Financial Resource Strain: Not on file  Food Insecurity: Not on file  Transportation Needs: Not on file  Physical Activity: Not on file  Stress: Not on file  Social Connections: Not on file       Objective:   Physical Exam Vitals reviewed.  Constitutional:      General: He is not in acute distress.    Appearance: He is well-developed.     Comments: Body odor  HENT:     Head: Normocephalic.     Right Ear: Tympanic  membrane normal.     Left Ear: Tympanic membrane normal.  Eyes:     General:        Right eye: No discharge.        Left eye: No discharge.     Pupils: Pupils are equal, round, and reactive to light.  Neck:     Thyroid: No thyromegaly.  Cardiovascular:     Rate and Rhythm: Normal rate and regular rhythm.     Heart sounds: Normal heart sounds. No murmur heard. Pulmonary:     Effort: Pulmonary effort is normal. No respiratory distress.     Breath sounds: Normal breath sounds. No wheezing.  Abdominal:     General: Bowel sounds are normal. There is no distension.     Palpations: Abdomen is soft.     Tenderness: There is no abdominal tenderness.  Musculoskeletal:        General: No tenderness. Normal range of motion.     Cervical back: Normal range of motion and neck supple.  Skin:     General: Skin is warm and dry.     Findings: No erythema or rash.  Neurological:     Mental Status: He is alert and oriented to person, place, and time.     Cranial Nerves: No cranial nerve deficit.     Deep Tendon Reflexes: Reflexes are normal and symmetric.  Psychiatric:        Behavior: Behavior normal.        Thought Content: Thought content normal.        Judgment: Judgment normal.      BP (!) 142/74   Pulse 76   Temp (!) 97.4 F (36.3 C) (Temporal)   Ht _0  (1.778 m)   Wt 173 lb (78.5 kg)   SpO2 97%   BMI 24.82 kg/m      Assessment & Plan:  Jason Spencer comes in today with chief complaint of Anticoagulation   Diagnosis and orders addressed:  1. Atrial fibrillation with RVR (HCC) - CoaguChek XS/INR Waived - CMP14+EGFR - CBC with Differential/Platelet  2. Essential hypertension, benign - CMP14+EGFR - CBC with Differential/Platelet  3. Cardiomyopathy, unspecified type ([place]) - CMP14+EGFR - CBC with Differential/Platelet  4. Acute on chronic systolic CHF (congestive heart failure) (HCC) - CMP14+EGFR - CBC with Differential/Platelet  5. Chronic bronchitis, unspecified chronic bronchitis type ([place]) - CMP14+EGFR - CBC with Differential/Platelet  6. Alcohol abuse - CMP14+EGFR - CBC with Differential/Platelet  7. Current use of anticoagulant therapy - CMP14+EGFR - CBC with Differential/Platelet  8. GAD (generalized anxiety disorder) - CMP14+EGFR - CBC with Differential/Platelet  9. Hyperlipidemia, unspecified hyperlipidemia type - CMP14+EGFR - CBC with Differential/Platelet - Lipid panel  10. Tobacco abuse - CMP14+EGFR - CBC with Differential/Platelet  11. Atherosclerosis of aorta (HCC) Start Crestor 5 mg - CMP14+EGFR - CBC with Differential/Platelet - rosuvastatin (CRESTOR) 5 MG tablet; Take 1 tablet (5 mg total) by mouth daily.  Dispense: 90 tablet; Refill: 3  12. Annual physical exam - CMP14+EGFR - CBC with Differential/Platelet -  Lipid panel - PSA, total and free - TSH  13. Purpura senilis ([place])   Labs pending Health Maintenance reviewed Diet and exercise encouraged  Follow up plan: 1 month   [name], FNP

## 2022-03-02 NOTE — Patient Instructions (Signed)
Description   INR today is 2.5 (goal 2-3) At goal!  Continue current  dose of 7.5 mg (1 1/2 tabs)  Tuesday , Thursday, 5 mg (1 tab) all other days.

## 2022-04-03 ENCOUNTER — Encounter: Payer: Self-pay | Admitting: Family

## 2022-04-03 ENCOUNTER — Ambulatory Visit: Payer: Medicare HMO | Admitting: Family

## 2022-04-06 ENCOUNTER — Encounter: Payer: Self-pay | Admitting: Family Medicine

## 2022-04-06 ENCOUNTER — Ambulatory Visit (INDEPENDENT_AMBULATORY_CARE_PROVIDER_SITE_OTHER): Payer: Medicare HMO | Admitting: Family Medicine

## 2022-04-06 VITALS — BP 127/80 | HR 75 | Temp 98.0°F | Ht 70.0 in | Wt 173.0 lb

## 2022-04-06 DIAGNOSIS — I4891 Unspecified atrial fibrillation: Secondary | ICD-10-CM

## 2022-04-06 LAB — COAGUCHEK XS/INR WAIVED
INR: 2.2 — ABNORMAL HIGH (ref 0.9–1.1)
Prothrombin Time: 26.3 s

## 2022-04-06 NOTE — Progress Notes (Signed)
BP 127/80   Pulse 75   Temp 98 F (36.7 C)   Ht 5\' 10"  (1.778 m)   Wt 173 lb (78.5 kg)   SpO2 98%   BMI 24.82 kg/m    Subjective:   Patient ID: Jason Spencer, male    DOB: May 13, 1951, 71 y.o.   MRN: [id number]  HPI: Jason Spencer is a 71 y.o. male presenting on 04/06/2022 for Atrial Fibrillation   HPI Coumadin recheck Target goal: 2.0-3.0 Reason on anticoagulation: A-fib Patient denies any bruising or bleeding or chest pain or palpitations   Relevant past medical, surgical, family and social history reviewed and updated as indicated. Interim medical history since our last visit reviewed. Allergies and medications reviewed and updated.  Review of Systems  Constitutional:  Negative for chills and fever.  Respiratory:  Negative for shortness of breath and wheezing.   Cardiovascular:  Negative for chest pain and leg swelling.  Gastrointestinal:  Negative for blood in stool.  Genitourinary:  Negative for hematuria.  Musculoskeletal:  Negative for back pain and gait problem.  Skin:  Negative for rash.  All other systems reviewed and are negative.   Per HPI unless specifically indicated above   Allergies as of 04/06/2022       Reactions   Shellfish Allergy Nausea And Vomiting        Medication List        Accurate as of April 06, 2022 11:17 AM. If you have any questions, ask your nurse or doctor.          amLODipine 5 MG tablet Commonly known as: NORVASC Take 1 tablet (5 mg total) by mouth daily.   lisinopril 40 MG tablet Commonly known as: ZESTRIL Take 1 tablet (40 mg total) by mouth daily.   metoprolol succinate 100 MG 24 hr tablet Commonly known as: TOPROL-XL TAKE ONE TABLET ONCE DAILY WITH OR IMMEDIATELY FOLLOWING A MEAL   rosuvastatin 5 MG tablet Commonly known as: Crestor Take 1 tablet (5 mg total) by mouth daily.   Trelegy Ellipta 100-62.5-25 MCG/ACT Aepb Generic drug: Fluticasone-Umeclidin-Vilant Inhale 1 puff into the lungs  daily.   warfarin 5 MG tablet Commonly known as: COUMADIN Take as directed by the anticoagulation clinic. If you are unsure how to take this medication, talk to your nurse or doctor. Original instructions: TAKE (1) TABLET AS DIR- ECTED BY COUMADIN CLINIC         Objective:   BP 127/80   Pulse 75   Temp 98 F (36.7 C)   Ht 5\' 10"  (1.778 m)   Wt 173 lb (78.5 kg)   SpO2 98%   BMI 24.82 kg/m   Wt Readings from Last 3 Encounters:  04/06/22 173 lb (78.5 kg)  03/02/22 173 lb (78.5 kg)  01/30/22 176 lb 3.2 oz (79.9 kg)    Physical Exam Vitals and nursing note reviewed.  Constitutional:      General: He is not in acute distress.    Appearance: Normal appearance. He is not ill-appearing.  Skin:    Findings: No bruising.  Neurological:     Mental Status: He is alert.       Assessment & Plan:   Problem List Items Addressed This Visit       Cardiovascular and Mediastinum   Atrial fibrillation with RVR (HCC) - Primary   Relevant Orders   CoaguChek XS/INR Waived    Description   INR today is 2.2 (goal 2-3) At goal!  Continue current  dose of  7.5 mg (1 1/2 tabs)  Tuesday , Thursday, 5 mg (1 tab) all other days.   Recheck in 4 to 6 weeks     Follow up plan: Return if symptoms worsen or fail to improve, for 4 to 6-week INR recheck with PCP.  Counseling provided for all of the vaccine components Orders Placed This Encounter  Procedures   CoaguChek XS/INR Harvey Franciso Dierks, MD [facility] 04/06/2022, 11:17 AM

## 2022-05-04 ENCOUNTER — Ambulatory Visit (INDEPENDENT_AMBULATORY_CARE_PROVIDER_SITE_OTHER): Payer: Medicare HMO | Admitting: Family

## 2022-05-04 ENCOUNTER — Encounter: Payer: Self-pay | Admitting: Family

## 2022-05-04 VITALS — BP 137/74 | HR 74 | Temp 97.2°F | Ht 70.0 in | Wt 180.8 lb

## 2022-05-04 DIAGNOSIS — J42 Unspecified chronic bronchitis: Secondary | ICD-10-CM

## 2022-05-04 DIAGNOSIS — I5023 Acute on chronic systolic (congestive) heart failure: Secondary | ICD-10-CM

## 2022-05-04 DIAGNOSIS — I10 Essential (primary) hypertension: Secondary | ICD-10-CM | POA: Diagnosis not present

## 2022-05-04 DIAGNOSIS — E785 Hyperlipidemia, unspecified: Secondary | ICD-10-CM

## 2022-05-04 DIAGNOSIS — I700 Atherosclerosis of aorta: Secondary | ICD-10-CM | POA: Diagnosis not present

## 2022-05-04 DIAGNOSIS — I4891 Unspecified atrial fibrillation: Secondary | ICD-10-CM

## 2022-05-04 DIAGNOSIS — F1010 Alcohol abuse, uncomplicated: Secondary | ICD-10-CM

## 2022-05-04 DIAGNOSIS — D692 Other nonthrombocytopenic purpura: Secondary | ICD-10-CM

## 2022-05-04 DIAGNOSIS — I2510 Atherosclerotic heart disease of native coronary artery without angina pectoris: Secondary | ICD-10-CM | POA: Diagnosis not present

## 2022-05-04 DIAGNOSIS — Z7901 Long term (current) use of anticoagulants: Secondary | ICD-10-CM

## 2022-05-04 DIAGNOSIS — F411 Generalized anxiety disorder: Secondary | ICD-10-CM

## 2022-05-04 LAB — COAGUCHEK XS/INR WAIVED
INR: 1.7 — ABNORMAL HIGH (ref 0.9–1.1)
Prothrombin Time: 20.9 s

## 2022-05-04 MED ORDER — BREZTRI AEROSPHERE 160-9-4.8 MCG/ACT IN AERO: 2.0000 | INHALATION_SPRAY | Freq: Two times a day (BID) | RESPIRATORY_TRACT | 11 refills | Status: DC

## 2022-05-04 NOTE — Patient Instructions (Signed)
Description   INR today is 2.2 (goal 2-3) At goal!  Take 10 mg today (110/26/23), then Continue current  dose of 7.5 mg (1 1/2 tabs)  Tuesday , Thursday, 5 mg (1 tab) all other days.   Recheck in 4 to 6 weeks

## 2022-05-04 NOTE — Progress Notes (Signed)
Subjective:    Patient ID: Jason Spencer, male    DOB: 1951/04/20, 71 y.o.   MRN: [id number]  Chief Complaint  Patient presents with   Coagulation Disorder    4-6 week re check    Pt presents to the office today for INR check and chronic follow up. He is currently taking warfarin for A Fib. See anticoagulation flow sheet. Denies any bleeding, missed, extra, or changes in diet.  Has purpura senilis.    His INR today is 1.7.  He is currently taking 7.5 mg (1 1/2 tabs)  Tuesday , Thursday, and 5 mg (1 tab) all other days.  See anticoagulation flowsheet.    He does have a hx of alcohol abuse and admits to drinking 4-5  larger beers a day. He is followed by Cardiologists for A Fib, CAD, and Cardiomyopathy.    He has COPD and smokes a 1/2 pack and uses a wood stove. He has intermittent SOB and wheezing. He has not used his Telegy recently.   He has atherosclerosis of aorta and takes Crestor 5 mg daily.    He has purpura senilis and takes warfarin. Stable Hypertension This is a chronic problem. The current episode started more than 1 year ago. The problem has been resolved since onset. The problem is controlled. Pertinent negatives include no malaise/fatigue, peripheral edema or shortness of breath. Risk factors for coronary artery disease include dyslipidemia, obesity, male gender and smoking/tobacco exposure. The current treatment provides moderate improvement.  Congestive Heart Failure Presents for follow-up visit. Associated symptoms include fatigue. Pertinent negatives include no edema or shortness of breath. The symptoms have been stable.  Nicotine Dependence Presents for follow-up visit. Symptoms include fatigue. His urge triggers include company of smokers. The symptoms have been stable. He smokes < 1/2 a pack of cigarettes per day.      Review of Systems  Constitutional:  Positive for fatigue. Negative for malaise/fatigue.  Respiratory:  Negative for shortness of breath.   All  other systems reviewed and are negative.      Objective:   Physical Exam Vitals reviewed.  Constitutional:      General: He is not in acute distress.    Appearance: He is well-developed.  HENT:     Head: Normocephalic.     Right Ear: Tympanic membrane normal.     Left Ear: Tympanic membrane normal.  Eyes:     General:        Right eye: No discharge.        Left eye: No discharge.     Pupils: Pupils are equal, round, and reactive to light.  Neck:     Thyroid: No thyromegaly.  Cardiovascular:     Rate and Rhythm: Normal rate and regular rhythm.     Heart sounds: Normal heart sounds. No murmur heard. Pulmonary:     Effort: Pulmonary effort is normal. No respiratory distress.     Breath sounds: Wheezing and rhonchi present.  Abdominal:     General: Bowel sounds are normal. There is no distension.     Palpations: Abdomen is soft.     Tenderness: There is no abdominal tenderness.  Musculoskeletal:        General: No tenderness. Normal range of motion.     Cervical back: Normal range of motion and neck supple.  Skin:    General: Skin is warm and dry.     Findings: No erythema or rash.  Neurological:     Mental Status: He is  alert and oriented to person, place, and time.     Cranial Nerves: No cranial nerve deficit.     Deep Tendon Reflexes: Reflexes are normal and symmetric.  Psychiatric:        Behavior: Behavior normal.        Thought Content: Thought content normal.        Judgment: Judgment normal.       Blood pressure 137/74, pulse 74, temperature (!) 97.2 F (36.2 C), temperature source Temporal, height _0  (1.778 m), weight 180 lb 12.8 oz (82 kg), SpO2 98 %.BP 137/74   Pulse 74   Temp (!) 97.2 F (36.2 C) (Temporal)   Ht _1  (1.778 m)   Wt 180 lb 12.8 oz (82 kg)   SpO2 98%   BMI 25.94 kg/m      Assessment & Plan:  Jason Spencer comes in today with chief complaint of Coagulation Disorder (4-6 week re check )   Diagnosis and orders  addressed:  1. Atrial fibrillation with RVR ([place]) Description   INR today is 2.2 (goal 2-3) At goal!  Take 10 mg today (110/26/23), then Continue current  dose of 7.5 mg (1 1/2 tabs)  Tuesday , Thursday, 5 mg (1 tab) all other days.   Recheck in 4 to 6 weeks    - CoaguChek XS/INR Waived - CMP14+EGFR  2. Acute on chronic systolic CHF (congestive heart failure) (HCC)  - CMP14+EGFR  3. Coronary artery disease involving native coronary artery of native heart without angina pectoris - CMP14+EGFR  4. Essential hypertension, benign - CMP14+EGFR  5. Purpura senilis (HCC)  - CMP14+EGFR  6. Atherosclerosis of aorta (HCC) - CMP14+EGFR  7. Chronic bronchitis, unspecified chronic bronchitis type ([place]) Start Breztri, sample given today and discussed how to use - CMP14+EGFR - Budeson-Glycopyrrol-Formoterol (BREZTRI AEROSPHERE) 160-9-4.8 MCG/ACT AERO; Inhale 2 puffs into the lungs 2 (two) times daily.  Dispense: 10.7 g; Refill: 11  8. Hyperlipidemia, unspecified hyperlipidemia type - CMP14+EGFR  9. Current use of anticoagulant therapy - CMP14+EGFR  10. GAD (generalized anxiety disorder) - CMP14+EGFR  11. Alcohol abuse - CMP14+EGFR   Labs pending Health Maintenance reviewed Diet and exercise encouraged  Follow up plan: 2 weeks to recheck INR   [name], FNP

## 2022-05-18 ENCOUNTER — Encounter: Payer: Self-pay | Admitting: Family

## 2022-05-18 ENCOUNTER — Ambulatory Visit (INDEPENDENT_AMBULATORY_CARE_PROVIDER_SITE_OTHER): Payer: Medicare HMO | Admitting: Family

## 2022-05-18 VITALS — BP 137/66 | HR 76 | Temp 97.0°F | Ht 70.0 in | Wt 183.6 lb

## 2022-05-18 DIAGNOSIS — I4891 Unspecified atrial fibrillation: Secondary | ICD-10-CM | POA: Diagnosis not present

## 2022-05-18 DIAGNOSIS — I10 Essential (primary) hypertension: Secondary | ICD-10-CM

## 2022-05-18 DIAGNOSIS — Z720 Tobacco use: Secondary | ICD-10-CM | POA: Diagnosis not present

## 2022-05-18 LAB — COAGUCHEK XS/INR WAIVED
INR: 1.9 — ABNORMAL HIGH (ref 0.9–1.1)
Prothrombin Time: 22.3 s

## 2022-05-18 NOTE — Patient Instructions (Signed)
Description   INR today is 1.9 (goal 2-3) too thick  Increase   dose of 7.5 mg (1 1/2 tabs)  Tuesday , Thursday and Saturday, 5 mg (1 tab) all other days.   Recheck in 2 weeks

## 2022-05-18 NOTE — Progress Notes (Signed)
Subjective:    Patient ID: Jason Spencer, male    DOB: 02/28/1951, 71 y.o.   MRN: [id number]  Chief Complaint  Patient presents with   Anticoagulation   Pt presents to the office today for INR check and chronic follow up. He is currently taking warfarin for A Fib. See anticoagulation flow sheet. Denies any bleeding, missed, extra, or changes in diet.  Has purpura senilis.    His INR today is 1.9.  He is currently taking 7.5 mg (1 1/2 tabs)  Tuesday , Thursday, and 5 mg (1 tab) all other days.  See anticoagulation flowsheet.    He does have a hx of alcohol abuse and admits to drinking 4-5  larger beers a day. He is followed by Cardiologists for A Fib, CAD, and Cardiomyopathy.    He has COPD and smokes a 1/2 pack and uses a wood stove. He has intermittent SOB and wheezing. He has not used his Telegy recently.    He has atherosclerosis of aorta and takes Crestor 5 mg daily.    He has purpura senilis and takes warfarin. Stable Hypertension This is a chronic problem. The current episode started more than 1 year ago. The problem has been resolved since onset. The problem is controlled. Pertinent negatives include no malaise/fatigue, peripheral edema or shortness of breath. Risk factors for coronary artery disease include obesity, male gender, smoking/tobacco exposure and sedentary lifestyle. The current treatment provides moderate improvement.      Review of Systems  Constitutional:  Negative for malaise/fatigue.  Respiratory:  Negative for shortness of breath.   All other systems reviewed and are negative.      Objective:   Physical Exam Vitals reviewed.  Constitutional:      General: He is not in acute distress.    Appearance: He is well-developed.  HENT:     Head: Normocephalic.  Eyes:     General:        Right eye: No discharge.        Left eye: No discharge.     Pupils: Pupils are equal, round, and reactive to light.  Neck:     Thyroid: No thyromegaly.   Cardiovascular:     Rate and Rhythm: Normal rate and regular rhythm.     Heart sounds: Normal heart sounds. No murmur heard. Pulmonary:     Effort: Pulmonary effort is normal. No respiratory distress.     Breath sounds: Wheezing present.  Abdominal:     General: Bowel sounds are normal. There is no distension.     Palpations: Abdomen is soft.     Tenderness: There is no abdominal tenderness.  Musculoskeletal:        General: No tenderness. Normal range of motion.     Cervical back: Normal range of motion and neck supple.  Skin:    General: Skin is warm and dry.     Findings: No erythema or rash.  Neurological:     Mental Status: He is alert and oriented to person, place, and time.     Cranial Nerves: No cranial nerve deficit.     Deep Tendon Reflexes: Reflexes are normal and symmetric.  Psychiatric:        Behavior: Behavior normal.        Thought Content: Thought content normal.        Judgment: Judgment normal.       BP 137/66   Pulse 76   Temp (!) 97 F (36.1 C) (Temporal)   Ht  5\' 10"  (1.778 m)   Wt 183 lb 9.6 oz (83.3 kg)   BMI 26.34 kg/m      Assessment & Plan:  Windel Keziah comes in today with chief complaint of Anticoagulation   Diagnosis and orders addressed:  1. Atrial fibrillation with RVR (HCC) Description   INR today is 1.9 (goal 2-3) too thick  Increase   dose of 7.5 mg (1 1/2 tabs)  Tuesday , Thursday and Saturday, 5 mg (1 tab) all other days.   Recheck in 2 weeks     - CoaguChek XS/INR Waived  2. Tobacco abuse  3. Essential hypertension, benign    Health Maintenance reviewed Diet and exercise encouraged  Follow up plan: 2 weeks   Wednesday, FNP

## 2022-06-06 ENCOUNTER — Ambulatory Visit (INDEPENDENT_AMBULATORY_CARE_PROVIDER_SITE_OTHER): Payer: Medicare HMO | Admitting: Family

## 2022-06-06 ENCOUNTER — Encounter: Payer: Self-pay | Admitting: Family

## 2022-06-06 VITALS — BP 129/72 | HR 73 | Temp 97.3°F | Ht 70.0 in | Wt 184.0 lb

## 2022-06-06 DIAGNOSIS — D692 Other nonthrombocytopenic purpura: Secondary | ICD-10-CM

## 2022-06-06 DIAGNOSIS — I4891 Unspecified atrial fibrillation: Secondary | ICD-10-CM | POA: Diagnosis not present

## 2022-06-06 DIAGNOSIS — J42 Unspecified chronic bronchitis: Secondary | ICD-10-CM | POA: Diagnosis not present

## 2022-06-06 DIAGNOSIS — I10 Essential (primary) hypertension: Secondary | ICD-10-CM | POA: Diagnosis not present

## 2022-06-06 DIAGNOSIS — F1010 Alcohol abuse, uncomplicated: Secondary | ICD-10-CM | POA: Diagnosis not present

## 2022-06-06 DIAGNOSIS — Z7901 Long term (current) use of anticoagulants: Secondary | ICD-10-CM | POA: Diagnosis not present

## 2022-06-06 DIAGNOSIS — Z720 Tobacco use: Secondary | ICD-10-CM | POA: Diagnosis not present

## 2022-06-06 LAB — COAGUCHEK XS/INR WAIVED
INR: 1.8 — ABNORMAL HIGH (ref 0.9–1.1)
Prothrombin Time: 21.5 s

## 2022-06-06 NOTE — Patient Instructions (Signed)
Description   INR today is 1.8 (goal 2-3) too thick  Increase  dose of 7.5 mg (1 1/2 tabs)  Tuesday , Thursday, Saturday and Sunday, 5 mg (1 tab) all other days.   Recheck in 2 weeks

## 2022-06-06 NOTE — Progress Notes (Signed)
Subjective:    Patient ID: Jason Spencer, male    DOB: December 22, 1950, 71 y.o.   MRN: [id number]  Chief Complaint  Patient presents with   Anticoagulation   Pt presents to the office today for INR check and chronic follow up. He is currently taking warfarin for A Fib. See anticoagulation flow sheet. Denies any bleeding, missed, extra, or changes in diet.    His INR today is 1.8.  He is currently taking 7.5 mg (1 1/2 tabs)  Tuesday , Thursday, and 5 mg (1 tab) all other days.  See anticoagulation flowsheet.    He does have a hx of alcohol abuse and admits to drinking 4-5  larger beers a day. He is followed by Cardiologists for A Fib, CAD, and Cardiomyopathy.    He has COPD and smokes a 1/2 pack and uses a wood stove. He has intermittent SOB and wheezing. He has not used his Telegy recently.    He has atherosclerosis of aorta and takes Crestor 5 mg daily.    He has purpura senilis and takes warfarin. Stable Hypertension This is a chronic problem. The current episode started more than 1 year ago. The problem has been waxing and waning since onset. The problem is uncontrolled. Associated symptoms include shortness of breath. Pertinent negatives include no malaise/fatigue or peripheral edema. Risk factors for coronary artery disease include dyslipidemia, obesity, male gender and smoking/tobacco exposure. The current treatment provides moderate improvement.      Review of Systems  Constitutional:  Negative for malaise/fatigue.  Respiratory:  Positive for shortness of breath.   All other systems reviewed and are negative.      Objective:   Physical Exam Vitals reviewed.  Constitutional:      General: He is not in acute distress.    Appearance: He is well-developed.  HENT:     Head: Normocephalic.  Eyes:     General:        Right eye: No discharge.        Left eye: No discharge.     Pupils: Pupils are equal, round, and reactive to light.  Neck:     Thyroid: No thyromegaly.   Cardiovascular:     Rate and Rhythm: Normal rate and regular rhythm.     Heart sounds: Normal heart sounds. No murmur heard. Pulmonary:     Effort: Pulmonary effort is normal. No respiratory distress.     Breath sounds: Rhonchi present. No wheezing.  Abdominal:     General: Bowel sounds are normal. There is no distension.     Palpations: Abdomen is soft.     Tenderness: There is no abdominal tenderness.  Musculoskeletal:        General: No tenderness. Normal range of motion.     Cervical back: Normal range of motion and neck supple.  Skin:    General: Skin is warm and dry.     Findings: No erythema or rash.  Neurological:     Mental Status: He is alert and oriented to person, place, and time.     Cranial Nerves: No cranial nerve deficit.     Deep Tendon Reflexes: Reflexes are normal and symmetric.  Psychiatric:        Behavior: Behavior normal.        Thought Content: Thought content normal.        Judgment: Judgment normal.          BP 129/72   Pulse 73   Temp (!) 97.3 F (36.3  C) (Temporal)   Ht _0  (1.778 m)   Wt 184 lb (83.5 kg)   SpO2 98%   BMI 26.40 kg/m   Assessment & Plan:  Jason Spencer comes in today with chief complaint of Anticoagulation   Diagnosis and orders addressed:  1. Atrial fibrillation with RVR ([place]) Description   INR today is 1.8 (goal 2-3) too thick  Take 10 mg today (06/06/22) then Increase  dose of 7.5 mg (1 1/2 tabs)  Tuesday , Thursday, Saturday and Sunday, 5 mg (1 tab) all other days.   Recheck in 2 weeks     - CoaguChek XS/INR Waived - CMP14+EGFR  2. Chronic bronchitis, unspecified chronic bronchitis type ([place]) - CMP14+EGFR  3. Essential hypertension, benign  - CMP14+EGFR  4. Current use of anticoagulant therapy - CMP14+EGFR  5. Purpura senilis (HCC) - CMP14+EGFR  6. Tobacco abuse - CMP14+EGFR  7. Alcohol abuse - CMP14+EGFR   Labs pending Health Maintenance reviewed Diet and exercise  encouraged  Follow up plan: 2 weeks    [name], FNP

## 2022-06-07 LAB — CMP14+EGFR
ALT: 11 IU/L (ref 0–44)
AST: 25 IU/L (ref 0–40)
Albumin/Globulin Ratio: 1.2 (ref 1.2–2.2)
Albumin: 4 g/dL (ref 3.9–4.9)
Alkaline Phosphatase: 58 IU/L (ref 44–121)
BUN/Creatinine Ratio: 10 (ref 10–24)
BUN: 10 mg/dL (ref 8–27)
Bilirubin Total: 0.5 mg/dL (ref 0.0–1.2)
CO2: 27 mmol/L (ref 20–29)
Calcium: 9 mg/dL (ref 8.6–10.2)
Chloride: 97 mmol/L (ref 96–106)
Creatinine, Ser: 0.99 mg/dL (ref 0.76–1.27)
Globulin, Total: 3.3 g/dL (ref 1.5–4.5)
Glucose: 99 mg/dL (ref 70–99)
Potassium: 4.6 mmol/L (ref 3.5–5.2)
Sodium: 134 mmol/L (ref 134–144)
Total Protein: 7.3 g/dL (ref 6.0–8.5)
eGFR: 82 mL/min/{1.73_m2} (ref >59)

## 2022-06-15 ENCOUNTER — Other Ambulatory Visit: Payer: Self-pay | Admitting: Family

## 2022-06-15 DIAGNOSIS — I10 Essential (primary) hypertension: Secondary | ICD-10-CM

## 2022-06-15 DIAGNOSIS — I2510 Atherosclerotic heart disease of native coronary artery without angina pectoris: Secondary | ICD-10-CM

## 2022-06-27 ENCOUNTER — Encounter: Payer: Self-pay | Admitting: Family

## 2022-06-27 ENCOUNTER — Ambulatory Visit (INDEPENDENT_AMBULATORY_CARE_PROVIDER_SITE_OTHER): Payer: Medicare HMO | Admitting: Family

## 2022-06-27 VITALS — BP 139/74 | HR 102 | Temp 97.7°F | Ht 70.0 in | Wt 181.9 lb

## 2022-06-27 DIAGNOSIS — I700 Atherosclerosis of aorta: Secondary | ICD-10-CM

## 2022-06-27 DIAGNOSIS — I2510 Atherosclerotic heart disease of native coronary artery without angina pectoris: Secondary | ICD-10-CM | POA: Diagnosis not present

## 2022-06-27 DIAGNOSIS — I5023 Acute on chronic systolic (congestive) heart failure: Secondary | ICD-10-CM | POA: Diagnosis not present

## 2022-06-27 DIAGNOSIS — Z7901 Long term (current) use of anticoagulants: Secondary | ICD-10-CM

## 2022-06-27 DIAGNOSIS — J441 Chronic obstructive pulmonary disease with (acute) exacerbation: Secondary | ICD-10-CM

## 2022-06-27 DIAGNOSIS — D692 Other nonthrombocytopenic purpura: Secondary | ICD-10-CM | POA: Diagnosis not present

## 2022-06-27 DIAGNOSIS — I4891 Unspecified atrial fibrillation: Secondary | ICD-10-CM

## 2022-06-27 DIAGNOSIS — F1010 Alcohol abuse, uncomplicated: Secondary | ICD-10-CM

## 2022-06-27 DIAGNOSIS — I10 Essential (primary) hypertension: Secondary | ICD-10-CM | POA: Diagnosis not present

## 2022-06-27 LAB — COAGUCHEK XS/INR WAIVED
INR: 2.1 — ABNORMAL HIGH (ref 0.9–1.1)
Prothrombin Time: 24.7 s

## 2022-06-27 MED ORDER — PREDNISONE 10 MG (21) PO TBPK: ORAL_TABLET | ORAL | 0 refills | Status: DC

## 2022-06-27 NOTE — Progress Notes (Signed)
Subjective:    Patient ID: Jason Spencer, male    DOB: [birth year], 71 y.o.   MRN: [id number]  No chief complaint on file.  Pt presents to the office today for INR check and chronic follow up. He is currently taking warfarin for A Fib. See anticoagulation flow sheet. Denies any bleeding, missed, extra, or changes in diet.    His INR today is 2.1.  He is currently taking 5 mg Monday, Wednesday, and Friday and 7.5 mg all other days. See anticoagulation flowsheet.    He does have a hx of alcohol abuse and admits to drinking 4-5  larger beers a day. He is followed by Cardiologists for A Fib, CAD, and Cardiomyopathy.    He has COPD and smokes a 1/2 pack and uses a wood stove. He has intermittent SOB and wheezing. He has not used his Telegy recently.    He has atherosclerosis of aorta and takes Crestor 5 mg daily.    He has purpura senilis and takes warfarin. Stable  Hypertension This is a chronic problem. The current episode started more than 1 year ago. The problem has been resolved since onset. The problem is controlled. Associated symptoms include shortness of breath. Pertinent negatives include no malaise/fatigue or peripheral edema. The current treatment provides moderate improvement.  Congestive Heart Failure Presents for follow-up visit. Associated symptoms include shortness of breath.  Cough This is a chronic problem. The current episode started 1 to 4 weeks ago. The problem has been waxing and waning. The problem occurs every few minutes. The cough is Non-productive. Associated symptoms include shortness of breath and wheezing.      Review of Systems  Constitutional:  Negative for malaise/fatigue.  Respiratory:  Positive for cough, shortness of breath and wheezing.   All other systems reviewed and are negative.      Objective:   Physical Exam Vitals reviewed.  Constitutional:      General: He is not in acute distress.    Appearance: He is well-developed.  HENT:      Head: Normocephalic.  Eyes:     General:        Right eye: No discharge.        Left eye: No discharge.     Pupils: Pupils are equal, round, and reactive to light.  Neck:     Thyroid: No thyromegaly.  Cardiovascular:     Rate and Rhythm: Normal rate and regular rhythm.     Heart sounds: Normal heart sounds. No murmur heard. Pulmonary:     Effort: Pulmonary effort is normal. No respiratory distress.     Breath sounds: Wheezing and rhonchi present.  Abdominal:     General: Bowel sounds are normal. There is no distension.     Palpations: Abdomen is soft.     Tenderness: There is no abdominal tenderness.  Musculoskeletal:        General: No tenderness. Normal range of motion.     Cervical back: Normal range of motion and neck supple.  Skin:    General: Skin is warm and dry.     Findings: No erythema or rash.  Neurological:     Mental Status: He is alert and oriented to person, place, and time.     Cranial Nerves: No cranial nerve deficit.     Deep Tendon Reflexes: Reflexes are normal and symmetric.  Psychiatric:        Behavior: Behavior normal.        Thought Content: Thought content normal.  Judgment: Judgment normal.         BP 139/74   Pulse (!) 102   Temp 97.7 F (36.5 C) (Temporal)   Ht 5\' 10"  (1.778 m)   Wt 181 lb 14.4 oz (82.5 kg)   BMI 26.10 kg/m    Assessment & Plan:  Larue Lightner comes in today with chief complaint of No chief complaint on file.   Diagnosis and orders addressed:  1. Atrial fibrillation with RVR (HCC) Description   INR today is 2.1 (goal 2-3) at goal  Continue current 5 mg on Monday, Wednesday, and Friday. 7.5 mg all other days.   Recheck in 4 weeks     - CoaguChek XS/INR Waived  2. Acute on chronic systolic CHF (congestive heart failure) (HCC)  3. Alcohol abuse  4. Coronary artery disease involving native coronary artery of native heart without angina pectoris  5. COPD exacerbation (HCC) - predniSONE (STERAPRED  UNI-PAK 21 TAB) 10 MG (21) TBPK tablet; Use as directed  Dispense: 21 tablet; Refill: 0  6. Current use of anticoagulant therapy  7. Essential hypertension, benign  8. Purpura senilis (HCC)  9. Atherosclerosis of aorta (HCC)   Labs pending Health Maintenance reviewed Diet and exercise encouraged  Follow up plan: 1 month    Wednesday, FNP

## 2022-06-27 NOTE — Patient Instructions (Signed)
Description   INR today is 2.1 (goal 2-3) at goal  Continue current 5 mg on Monday, Wednesday, and Friday. 7.5 mg all other days.   Recheck in 4 weeks

## 2022-07-01 ENCOUNTER — Other Ambulatory Visit: Payer: Self-pay | Admitting: Family

## 2022-07-01 DIAGNOSIS — I2510 Atherosclerotic heart disease of native coronary artery without angina pectoris: Secondary | ICD-10-CM

## 2022-07-01 DIAGNOSIS — I4891 Unspecified atrial fibrillation: Secondary | ICD-10-CM

## 2022-07-04 ENCOUNTER — Telehealth: Payer: Self-pay

## 2022-07-04 DIAGNOSIS — I4891 Unspecified atrial fibrillation: Secondary | ICD-10-CM

## 2022-07-04 DIAGNOSIS — I2510 Atherosclerotic heart disease of native coronary artery without angina pectoris: Secondary | ICD-10-CM

## 2022-07-04 MED ORDER — WARFARIN SODIUM 5 MG PO TABS: ORAL_TABLET | ORAL | 2 refills | Status: DC

## 2022-07-04 NOTE — Telephone Encounter (Signed)
[facility] received refill request but they need an updated prescription with the current directions of take one tablet daily and 1-1/2 tablets the other days.

## 2022-07-04 NOTE — Telephone Encounter (Signed)
Prescription sent to pharmacy.

## 2022-07-31 ENCOUNTER — Ambulatory Visit (INDEPENDENT_AMBULATORY_CARE_PROVIDER_SITE_OTHER): Payer: Medicare HMO | Admitting: Family

## 2022-07-31 ENCOUNTER — Encounter: Payer: Self-pay | Admitting: Family

## 2022-07-31 VITALS — BP 140/74 | HR 84 | Temp 97.4°F | Ht 70.0 in | Wt 188.0 lb

## 2022-07-31 DIAGNOSIS — Z7901 Long term (current) use of anticoagulants: Secondary | ICD-10-CM

## 2022-07-31 DIAGNOSIS — Z720 Tobacco use: Secondary | ICD-10-CM

## 2022-07-31 DIAGNOSIS — J449 Chronic obstructive pulmonary disease, unspecified: Secondary | ICD-10-CM

## 2022-07-31 DIAGNOSIS — F17210 Nicotine dependence, cigarettes, uncomplicated: Secondary | ICD-10-CM | POA: Diagnosis not present

## 2022-07-31 DIAGNOSIS — D692 Other nonthrombocytopenic purpura: Secondary | ICD-10-CM

## 2022-07-31 DIAGNOSIS — I10 Essential (primary) hypertension: Secondary | ICD-10-CM

## 2022-07-31 DIAGNOSIS — I4891 Unspecified atrial fibrillation: Secondary | ICD-10-CM | POA: Diagnosis not present

## 2022-07-31 LAB — COAGUCHEK XS/INR WAIVED
INR: 1.6 — ABNORMAL HIGH (ref 0.9–1.1)
Prothrombin Time: 19.5 s

## 2022-07-31 NOTE — Progress Notes (Signed)
Subjective:    Patient ID: Jason Spencer, male    DOB: 1951-01-11, 72 y.o.   MRN: [id number]  Chief Complaint  Patient presents with   Anticoagulation   Pt presents to the office today for INR check and chronic follow up. Jason Spencer for A Fib. See anticoagulation flow sheet. Denies any bleeding, missed, extra, or changes in diet.    Jason Spencer INR today is 1.6.  Jason Spencer is currently taking 5 mg Monday, Wednesday, and Friday and 7.5 mg all other days. See anticoagulation flowsheet.    Jason Spencer and admits to drinking 4-5  larger beers a day. Jason Spencer for A Fib, CAD, and Cardiomyopathy.    Jason Spencer has COPD and smokes a 1/2 pack and uses a wood stove. Jason Spencer has intermittent SOB and wheezing. Jason Spencer has not used Jason Spencer Telegy recently.    Jason Spencer has atherosclerosis of aorta and takes Crestor 5 mg daily.    Jason Spencer has purpura senilis and takes Spencer. Stable Hypertension This is a chronic problem. The current episode started more than 1 year ago. The problem has been resolved since onset. The problem is controlled. Associated symptoms include malaise/fatigue. Pertinent negatives include no peripheral edema or shortness of breath. Risk factors for coronary artery disease include dyslipidemia, obesity and male gender. The current treatment provides moderate improvement.      Review of Systems  Constitutional:  Positive for malaise/fatigue.  Respiratory:  Negative for shortness of breath.   All other systems reviewed and are negative.      Objective:   Physical Exam Vitals reviewed.  Constitutional:      General: Jason Spencer is not in acute distress.    Appearance: Jason Spencer is well-developed.  HENT:     Head: Normocephalic.  Eyes:     General:        Right eye: No discharge.        Left eye: No discharge.     Pupils: Pupils are equal, round, and reactive to light.  Neck:     Thyroid: No thyromegaly.  Cardiovascular:     Rate and Rhythm: Normal rate and  regular rhythm.     Heart sounds: Normal heart sounds. No murmur heard. Pulmonary:     Effort: Pulmonary effort is normal. No respiratory distress.     Breath sounds: Normal breath sounds. No wheezing.  Abdominal:     General: Bowel sounds are normal. There is no distension.     Palpations: Abdomen is soft.     Tenderness: There is no abdominal tenderness.  Musculoskeletal:        General: No tenderness. Normal range of motion.     Cervical back: Normal range of motion and neck supple.  Skin:    General: Skin is warm and dry.     Findings: No erythema or rash.  Neurological:     Mental Status: Jason Spencer is alert and oriented to person, place, and time.     Cranial Nerves: No cranial nerve deficit.     Deep Tendon Reflexes: Reflexes are normal and symmetric.  Psychiatric:        Behavior: Behavior normal.        Thought Content: Thought content normal.        Judgment: Judgment normal.       BP (!) 140/74   Pulse 84   Temp (!) 97.4 F (36.3 C) (Temporal)   Ht 5\' 10"  (1.778 m)  Wt 188 lb (85.3 kg)   SpO2 97%   BMI 26.98 kg/m      Assessment & Plan:  Jason Spencer comes in today with chief complaint of Anticoagulation   Diagnosis and orders addressed:  1. Atrial fibrillation with RVR (HCC) - CoaguChek XS/INR Waived  2. Tobacco Spencer  3. Purpura senilis ([place])  4. Essential hypertension, benign  5. Current use of anticoagulant therapy   Description   INR today is 1.6 (goal 2-3) at goal  Increase 5 mg on Monday, and Friday. 7.5 mg all other days.   Recheck in 2 weeks       [name], FNP

## 2022-07-31 NOTE — Patient Instructions (Signed)
Description   INR today is 1.6 (goal 2-3) at goal  Increase 5 mg on Monday, and Friday. 7.5 mg all other days.   Recheck in 2 weeks

## 2022-08-14 ENCOUNTER — Ambulatory Visit (INDEPENDENT_AMBULATORY_CARE_PROVIDER_SITE_OTHER): Payer: Medicare HMO | Admitting: Family

## 2022-08-14 ENCOUNTER — Encounter: Payer: Self-pay | Admitting: Family

## 2022-08-14 VITALS — BP 123/57 | HR 71 | Temp 97.9°F | Ht 70.0 in | Wt 189.0 lb

## 2022-08-14 DIAGNOSIS — I4891 Unspecified atrial fibrillation: Secondary | ICD-10-CM

## 2022-08-14 DIAGNOSIS — J449 Chronic obstructive pulmonary disease, unspecified: Secondary | ICD-10-CM

## 2022-08-14 DIAGNOSIS — I10 Essential (primary) hypertension: Secondary | ICD-10-CM

## 2022-08-14 DIAGNOSIS — I700 Atherosclerosis of aorta: Secondary | ICD-10-CM

## 2022-08-14 DIAGNOSIS — J42 Unspecified chronic bronchitis: Secondary | ICD-10-CM

## 2022-08-14 DIAGNOSIS — F1010 Alcohol abuse, uncomplicated: Secondary | ICD-10-CM | POA: Diagnosis not present

## 2022-08-14 LAB — COAGUCHEK XS/INR WAIVED
INR: 1.6 — ABNORMAL HIGH (ref 0.9–1.1)
Prothrombin Time: 18.7 s

## 2022-08-14 NOTE — Patient Instructions (Signed)
Description   INR today is 1.6 (goal 2-3) at goal  Increase 5 mg on Friday. 7.5 mg all other days.   Recheck in 2 weeks

## 2022-08-14 NOTE — Progress Notes (Signed)
Subjective:    Patient ID: Jason Spencer, male    DOB: January 26, 1951, 72 y.o.   MRN: [id number]  Chief Complaint  Patient presents with   Anticoagulation   Pt presents to the office today for INR check and chronic follow up. He is currently taking warfarin for A Fib. See anticoagulation flow sheet. Denies any bleeding, extra, or changes in diet. Thinks he missed one dose.    His INR today is 1.6.  He is currently taking 5 mg Monday, and Friday and 7.5 mg all other days. See anticoagulation flowsheet.    He does have a hx of alcohol abuse and admits to drinking 4-5  larger beers a day. He is followed by Cardiologists for A Fib, CAD, and Cardiomyopathy.    He has COPD and smokes a 1/2 pack and uses a wood stove. He has intermittent SOB and wheezing. He has not used his Telegy recently.    He has atherosclerosis of aorta and takes Crestor 5 mg daily.    He has purpura senilis and takes warfarin. Stable Hypertension This is a chronic problem. The current episode started more than 1 year ago. The problem has been resolved since onset. The problem is controlled. Associated symptoms include malaise/fatigue. Pertinent negatives include no peripheral edema or shortness of breath. Risk factors for coronary artery disease include dyslipidemia and sedentary lifestyle. The current treatment provides moderate improvement.      Review of Systems  Constitutional:  Positive for malaise/fatigue.  Respiratory:  Negative for shortness of breath.   All other systems reviewed and are negative.      Objective:   Physical Exam Vitals reviewed.  Constitutional:      General: He is not in acute distress.    Appearance: He is well-developed.  HENT:     Head: Normocephalic.     Right Ear: Tympanic membrane normal.     Left Ear: Tympanic membrane normal.  Eyes:     General:        Right eye: No discharge.        Left eye: No discharge.     Pupils: Pupils are equal, round, and reactive to light.   Neck:     Thyroid: No thyromegaly.  Cardiovascular:     Rate and Rhythm: Normal rate and regular rhythm.     Heart sounds: Normal heart sounds. No murmur heard. Pulmonary:     Effort: Pulmonary effort is normal. No respiratory distress.     Breath sounds: Rhonchi present. No wheezing.  Abdominal:     General: Bowel sounds are normal. There is no distension.     Palpations: Abdomen is soft.     Tenderness: There is no abdominal tenderness.  Musculoskeletal:        General: No tenderness. Normal range of motion.     Cervical back: Normal range of motion and neck supple.  Skin:    General: Skin is warm and dry.     Findings: No erythema or rash.  Neurological:     Mental Status: He is alert and oriented to person, place, and time.     Cranial Nerves: No cranial nerve deficit.     Deep Tendon Reflexes: Reflexes are normal and symmetric.  Psychiatric:        Behavior: Behavior normal.        Thought Content: Thought content normal.        Judgment: Judgment normal.       BP (!) 123/57   Pulse  71   Temp 97.9 F (36.6 C) (Temporal)   Ht 5\' 10"  (1.778 m)   Wt 189 lb (85.7 kg)   BMI 27.12 kg/m      Assessment & Plan:  Martino Tompson comes in today with chief complaint of Anticoagulation   Diagnosis and orders addressed:  1. Atrial fibrillation with RVR ([place]) Description   INR today is 1.6 (goal 2-3) at goal  Increase 5 mg on Friday. 7.5 mg all other days.   Recheck in 2 weeks     - CoaguChek XS/INR Waived  2. Alcohol abuse  3. Chronic bronchitis, unspecified chronic bronchitis type ([place])  4. Essential hypertension, benign     Health Maintenance reviewed Diet and exercise encouraged  Follow up plan: 2 weeks   [name], FNP

## 2022-08-28 ENCOUNTER — Encounter: Payer: Self-pay | Admitting: Family

## 2022-08-28 ENCOUNTER — Ambulatory Visit (INDEPENDENT_AMBULATORY_CARE_PROVIDER_SITE_OTHER): Payer: Medicare HMO | Admitting: Family

## 2022-08-28 VITALS — BP 132/77 | HR 60 | Temp 97.6°F | Ht 70.0 in | Wt 186.0 lb

## 2022-08-28 DIAGNOSIS — I10 Essential (primary) hypertension: Secondary | ICD-10-CM | POA: Diagnosis not present

## 2022-08-28 DIAGNOSIS — I4891 Unspecified atrial fibrillation: Secondary | ICD-10-CM

## 2022-08-28 DIAGNOSIS — J449 Chronic obstructive pulmonary disease, unspecified: Secondary | ICD-10-CM

## 2022-08-28 DIAGNOSIS — I2510 Atherosclerotic heart disease of native coronary artery without angina pectoris: Secondary | ICD-10-CM

## 2022-08-28 DIAGNOSIS — J42 Unspecified chronic bronchitis: Secondary | ICD-10-CM

## 2022-08-28 LAB — COAGUCHEK XS/INR WAIVED
INR: 2.1 — ABNORMAL HIGH (ref 0.9–1.1)
Prothrombin Time: 25.5 s

## 2022-08-28 NOTE — Progress Notes (Signed)
Subjective:    Patient ID: Jason Spencer, male    DOB: 24-Apr-1951, 72 y.o.   MRN: [id number]  Chief Complaint  Patient presents with   Anticoagulation   Pt presents to the office today for INR check and chronic follow up. He is currently taking warfarin for A Fib. See anticoagulation flow sheet. Denies any bleeding, extra, or changes in diet. Thinks he missed one dose.    His INR today is 2.1.  He is currently taking 5 mg  Friday and 7.5 mg all other days. See anticoagulation flowsheet.    He does have a hx of alcohol abuse and admits to drinking 4-5  larger beers a day. He is followed by Cardiologists for A Fib, CAD, and Cardiomyopathy.    He has COPD and smokes a 1/2 pack and uses a wood stove. He has intermittent SOB and wheezing. He has not used his Telegy recently.    He has atherosclerosis of aorta and takes Crestor 5 mg daily.    He has purpura senilis and takes warfarin. Stable Hypertension This is a chronic problem. The current episode started more than 1 year ago. The problem has been resolved since onset. Associated symptoms include shortness of breath. Pertinent negatives include no malaise/fatigue or peripheral edema. Risk factors for coronary artery disease include dyslipidemia, obesity and male gender. The current treatment provides moderate improvement.      Review of Systems  Constitutional:  Negative for malaise/fatigue.  Respiratory:  Positive for shortness of breath.   All other systems reviewed and are negative.      Objective:   Physical Exam Vitals reviewed.  Constitutional:      General: He is not in acute distress.    Appearance: He is well-developed.  HENT:     Head: Normocephalic.  Eyes:     General:        Right eye: No discharge.        Left eye: No discharge.     Pupils: Pupils are equal, round, and reactive to light.  Neck:     Thyroid: No thyromegaly.  Cardiovascular:     Rate and Rhythm: Normal rate and regular rhythm.     Heart  sounds: Normal heart sounds. No murmur heard. Pulmonary:     Effort: Pulmonary effort is normal. No respiratory distress.     Breath sounds: Wheezing and rhonchi present.  Abdominal:     General: Bowel sounds are normal. There is no distension.     Palpations: Abdomen is soft.     Tenderness: There is no abdominal tenderness.  Musculoskeletal:        General: No tenderness. Normal range of motion.     Cervical back: Normal range of motion and neck supple.  Skin:    General: Skin is warm and dry.     Findings: No erythema or rash.  Neurological:     Mental Status: He is alert and oriented to person, place, and time.     Cranial Nerves: No cranial nerve deficit.     Deep Tendon Reflexes: Reflexes are normal and symmetric.  Psychiatric:        Behavior: Behavior normal.        Thought Content: Thought content normal.        Judgment: Judgment normal.       BP 132/77   Pulse 60   Temp 97.6 F (36.4 C) (Temporal)   Ht 5' 10"$  (1.778 m)   Wt 186 lb (84.4 kg)  SpO2 96%   BMI 26.69 kg/m      Assessment & Plan:  Norvin Rabas comes in today with chief complaint of Anticoagulation   Diagnosis and orders addressed:  1. Atrial fibrillation with RVR ([place]) Description   INR today is 2.1 (goal 2-3) at goal  Continue 5 mg on Friday. 7.5 mg all other days.   Recheck in 2 weeks     - CoaguChek XS/INR Waived  2. Coronary artery disease involving native coronary artery of native heart without angina pectoris   3. Chronic bronchitis, unspecified chronic bronchitis type ([place])  4. Essential hypertension, benign   Continue medications  Follow up in 1 month   [name], FNP

## 2022-08-28 NOTE — Patient Instructions (Signed)
Description   INR today is 2.1 (goal 2-3) at goal  Continue 5 mg on Friday. 7.5 mg all other days.   Recheck in 2 weeks

## 2022-09-15 ENCOUNTER — Ambulatory Visit (INDEPENDENT_AMBULATORY_CARE_PROVIDER_SITE_OTHER): Payer: Medicare HMO | Admitting: Family

## 2022-09-15 ENCOUNTER — Encounter: Payer: Self-pay | Admitting: Family

## 2022-09-15 VITALS — BP 126/61 | HR 61 | Temp 97.4°F | Ht 70.0 in | Wt 187.0 lb

## 2022-09-15 DIAGNOSIS — I2510 Atherosclerotic heart disease of native coronary artery without angina pectoris: Secondary | ICD-10-CM | POA: Diagnosis not present

## 2022-09-15 DIAGNOSIS — I5023 Acute on chronic systolic (congestive) heart failure: Secondary | ICD-10-CM

## 2022-09-15 DIAGNOSIS — Z720 Tobacco use: Secondary | ICD-10-CM | POA: Diagnosis not present

## 2022-09-15 DIAGNOSIS — J42 Unspecified chronic bronchitis: Secondary | ICD-10-CM

## 2022-09-15 DIAGNOSIS — E785 Hyperlipidemia, unspecified: Secondary | ICD-10-CM | POA: Diagnosis not present

## 2022-09-15 DIAGNOSIS — D692 Other nonthrombocytopenic purpura: Secondary | ICD-10-CM

## 2022-09-15 DIAGNOSIS — J441 Chronic obstructive pulmonary disease with (acute) exacerbation: Secondary | ICD-10-CM

## 2022-09-15 DIAGNOSIS — I4891 Unspecified atrial fibrillation: Secondary | ICD-10-CM | POA: Diagnosis not present

## 2022-09-15 DIAGNOSIS — I110 Hypertensive heart disease with heart failure: Secondary | ICD-10-CM | POA: Diagnosis not present

## 2022-09-15 DIAGNOSIS — F1010 Alcohol abuse, uncomplicated: Secondary | ICD-10-CM | POA: Diagnosis not present

## 2022-09-15 DIAGNOSIS — F411 Generalized anxiety disorder: Secondary | ICD-10-CM | POA: Diagnosis not present

## 2022-09-15 DIAGNOSIS — I700 Atherosclerosis of aorta: Secondary | ICD-10-CM

## 2022-09-15 DIAGNOSIS — I10 Essential (primary) hypertension: Secondary | ICD-10-CM

## 2022-09-15 LAB — COAGUCHEK XS/INR WAIVED
INR: 3.5 — ABNORMAL HIGH (ref 0.9–1.1)
Prothrombin Time: 41.4 s

## 2022-09-15 MED ORDER — ROSUVASTATIN CALCIUM 5 MG PO TABS: 5.0000 mg | ORAL_TABLET | Freq: Every day | ORAL | 3 refills | Status: DC

## 2022-09-15 MED ORDER — AMLODIPINE BESYLATE 5 MG PO TABS: 5.0000 mg | ORAL_TABLET | Freq: Every day | ORAL | 3 refills | Status: DC

## 2022-09-15 MED ORDER — METOPROLOL SUCCINATE ER 100 MG PO TB24: ORAL_TABLET | ORAL | 2 refills | Status: DC

## 2022-09-15 MED ORDER — WARFARIN SODIUM 5 MG PO TABS: ORAL_TABLET | ORAL | 2 refills | Status: DC

## 2022-09-15 MED ORDER — BREZTRI AEROSPHERE 160-9-4.8 MCG/ACT IN AERO: 2.0000 | INHALATION_SPRAY | Freq: Two times a day (BID) | RESPIRATORY_TRACT | 11 refills | Status: DC

## 2022-09-15 MED ORDER — LISINOPRIL 40 MG PO TABS: 40.0000 mg | ORAL_TABLET | Freq: Every day | ORAL | 3 refills | Status: DC

## 2022-09-15 NOTE — Patient Instructions (Signed)
Description   INR today is 3.5 (goal 2-3) too thin.   Hold todays dose, then  5 mg on Monday and  Friday. 7.5 mg all other days.   Recheck in 2 weeks

## 2022-09-15 NOTE — Progress Notes (Signed)
Subjective:    Patient ID: Jason Spencer, male    DOB: Nov 23, 1950, 72 y.o.   MRN: [id number]  Chief Complaint  Patient presents with   Anticoagulation   Pt presents to the office today for INR check and chronic follow up. He is currently taking warfarin for A Fib. See anticoagulation flow sheet. Denies any bleeding, extra, or changes in diet.    His INR today is 3.5.  He is currently taking 5 mg  Friday and 7.5 mg all other days. See anticoagulation flowsheet.    He does have a hx of alcohol abuse and admits to drinking 4-5  larger beers a day. He is followed by Cardiologists for A Fib, CAD, and Cardiomyopathy.    He has COPD and smokes a 1/2 pack and uses a wood stove. He has intermittent SOB and wheezing. He has not used his Telegy recently.    He has atherosclerosis of aorta and takes Crestor 5 mg daily.    He has purpura senilis and takes warfarin. Stable Hypertension This is a chronic problem. The current episode started more than 1 year ago. The problem has been resolved since onset. The problem is controlled. Associated symptoms include anxiety. Pertinent negatives include no malaise/fatigue, peripheral edema or shortness of breath. Risk factors for coronary artery disease include male gender, dyslipidemia and sedentary lifestyle. The current treatment provides moderate improvement.  Anxiety Presents for follow-up visit. Patient reports no excessive worry, irritability, nervous/anxious behavior or shortness of breath. Symptoms occur rarely. The severity of symptoms is mild.    Hyperlipidemia This is a chronic problem. The current episode started more than 1 year ago. The problem is controlled. Pertinent negatives include no shortness of breath. Current antihyperlipidemic treatment includes statins. The current treatment provides mild improvement of lipids. Risk factors for coronary artery disease include dyslipidemia, hypertension, a sedentary lifestyle and post-menopausal.       Review of Systems  Constitutional:  Negative for irritability and malaise/fatigue.  Respiratory:  Negative for shortness of breath.   Psychiatric/Behavioral:  The patient is not nervous/anxious.   All other systems reviewed and are negative.      Objective:   Physical Exam Vitals reviewed.  Constitutional:      General: He is not in acute distress.    Appearance: He is well-developed. He is obese.  HENT:     Head: Normocephalic.     Right Ear: Tympanic membrane normal.     Left Ear: Tympanic membrane normal.  Eyes:     General:        Right eye: No discharge.        Left eye: No discharge.     Pupils: Pupils are equal, round, and reactive to light.  Neck:     Thyroid: No thyromegaly.  Cardiovascular:     Rate and Rhythm: Normal rate and regular rhythm.     Heart sounds: Normal heart sounds. No murmur heard. Pulmonary:     Effort: Pulmonary effort is normal. No respiratory distress.     Breath sounds: Rhonchi present. No wheezing.  Abdominal:     General: Bowel sounds are normal. There is no distension.     Palpations: Abdomen is soft.     Tenderness: There is no abdominal tenderness.  Musculoskeletal:        General: No tenderness. Normal range of motion.     Cervical back: Normal range of motion and neck supple.  Skin:    General: Skin is warm and dry.  Findings: No erythema or rash.  Neurological:     Mental Status: He is alert and oriented to person, place, and time.     Cranial Nerves: No cranial nerve deficit.     Deep Tendon Reflexes: Reflexes are normal and symmetric.  Psychiatric:        Behavior: Behavior normal.        Thought Content: Thought content normal.        Judgment: Judgment normal.       BP 126/61   Pulse 61   Temp (!) 97.4 F (36.3 C) (Temporal)   Ht '5\' 10"'$  (1.778 m)   Wt 187 lb (84.8 kg)   SpO2 95%   BMI 26.83 kg/m      Assessment & Plan:  Jason Spencer comes in today with chief complaint of  Anticoagulation   Diagnosis and orders addressed:  1. Atrial fibrillation with RVR (HCC) - CoaguChek XS/INR Waived - warfarin (COUMADIN) 5 MG tablet; TAKE ONE TABLET DAILY AS DIRECTED  Dispense: 90 tablet; Refill: 2 - CMP14+EGFR  2. Atrial fibrillation, new onset (HCC) - warfarin (COUMADIN) 5 MG tablet; TAKE ONE TABLET DAILY AS DIRECTED  Dispense: 90 tablet; Refill: 2 - CMP14+EGFR - metoprolol succinate (TOPROL-XL) 100 MG 24 hr tablet; TAKE ONE TABLET ONCE DAILY WITH OR IMMEDIATELY FOLLOWING A MEAL  Dispense: 90 tablet; Refill: 2  3. Coronary artery disease involving native coronary artery of native heart without angina pectoris - warfarin (COUMADIN) 5 MG tablet; TAKE ONE TABLET DAILY AS DIRECTED  Dispense: 90 tablet; Refill: 2 - CMP14+EGFR - amLODipine (NORVASC) 5 MG tablet; Take 1 tablet (5 mg total) by mouth daily.  Dispense: 90 tablet; Refill: 3  4. Atrial fibrillation, unspecified type (HCC)  - warfarin (COUMADIN) 5 MG tablet; TAKE ONE TABLET DAILY AS DIRECTED  Dispense: 90 tablet; Refill: 2 - CMP14+EGFR  5. Acute on chronic systolic CHF (congestive heart failure) ([place])  6. COPD exacerbation ([place])  7. Tobacco abuse   8. Essential hypertension, benign - amLODipine (NORVASC) 5 MG tablet; Take 1 tablet (5 mg total) by mouth daily.  Dispense: 90 tablet; Refill: 3 - lisinopril (ZESTRIL) 40 MG tablet; Take 1 tablet (40 mg total) by mouth daily.  Dispense: 90 tablet; Refill: 3 - metoprolol succinate (TOPROL-XL) 100 MG 24 hr tablet; TAKE ONE TABLET ONCE DAILY WITH OR IMMEDIATELY FOLLOWING A MEAL  Dispense: 90 tablet; Refill: 2  9. Hyperlipidemia, unspecified hyperlipidemia type - rosuvastatin (CRESTOR) 5 MG tablet; Take 1 tablet (5 mg total) by mouth daily.  Dispense: 90 tablet; Refill: 3  10. GAD (generalized anxiety disorder)  11. Alcohol abuse  12. Purpura senilis ([place])   13. Atherosclerosis of aorta (HCC) - rosuvastatin (CRESTOR) 5 MG tablet; Take 1 tablet (5 mg  total) by mouth daily.  Dispense: 90 tablet; Refill: 3  14. Chronic bronchitis, unspecified chronic bronchitis type ([place])  - Budeson-Glycopyrrol-Formoterol (BREZTRI AEROSPHERE) 160-9-4.8 MCG/ACT AERO; Inhale 2 puffs into the lungs 2 (two) times daily.  Dispense: 10.7 g; Refill: 11  15. Atrial fibrillation, new onset w/ RVR - warfarin (COUMADIN) 5 MG tablet; TAKE ONE TABLET DAILY AS DIRECTED  Dispense: 90 tablet; Refill: 2 - CMP14+EGFR - metoprolol succinate (TOPROL-XL) 100 MG 24 hr tablet; TAKE ONE TABLET ONCE DAILY WITH OR IMMEDIATELY FOLLOWING A MEAL  Dispense: 90 tablet; Refill: 2   Labs pending Description   INR today is 3.5 (goal 2-3) too thin.   Hold todays dose, then  5 mg on Monday and  Friday. 7.5 mg  all other days.   Recheck in 2 weeks    Health Maintenance reviewed Diet and exercise encouraged  Follow up plan: 2 weeks    [name], FNP

## 2022-10-05 ENCOUNTER — Encounter: Payer: Self-pay | Admitting: Family

## 2022-10-05 ENCOUNTER — Ambulatory Visit (INDEPENDENT_AMBULATORY_CARE_PROVIDER_SITE_OTHER): Payer: Medicare HMO | Admitting: Family

## 2022-10-05 VITALS — BP 133/70 | HR 72 | Temp 97.2°F | Ht 70.0 in | Wt 192.4 lb

## 2022-10-05 DIAGNOSIS — I700 Atherosclerosis of aorta: Secondary | ICD-10-CM

## 2022-10-05 DIAGNOSIS — D692 Other nonthrombocytopenic purpura: Secondary | ICD-10-CM

## 2022-10-05 DIAGNOSIS — I10 Essential (primary) hypertension: Secondary | ICD-10-CM | POA: Diagnosis not present

## 2022-10-05 DIAGNOSIS — J449 Chronic obstructive pulmonary disease, unspecified: Secondary | ICD-10-CM

## 2022-10-05 DIAGNOSIS — I4891 Unspecified atrial fibrillation: Secondary | ICD-10-CM | POA: Diagnosis not present

## 2022-10-05 LAB — COAGUCHEK XS/INR WAIVED
INR: 2.8 — ABNORMAL HIGH (ref 0.9–1.1)
Prothrombin Time: 33.9 s

## 2022-10-05 NOTE — Progress Notes (Signed)
Subjective:    Patient ID: Jason Spencer, male    DOB: [birth year], 72 y.o.   MRN: [id number]  Chief Complaint  Patient presents with   Anticoagulation   Pt presents to the office today for INR check and chronic follow up. He is currently taking warfarin for A Fib. See anticoagulation flow sheet. Denies any bleeding, extra, or changes in diet.    His INR today is 2.8.  He is currently taking 5 mg  Monday, Friday and 7.5 mg all other days. See anticoagulation flowsheet.    He does have a hx of alcohol abuse and admits to drinking 4-5  larger beers a day. He is followed by Cardiologists for A Fib, CAD, and Cardiomyopathy.    He has COPD and smokes a 1/2 pack and uses a wood stove. He has intermittent SOB and wheezing. He has not used his Telegy recently.    He has atherosclerosis of aorta and takes Crestor 5 mg daily.    He has purpura senilis and takes warfarin. Stable Hypertension This is a chronic problem. The current episode started more than 1 year ago. The problem has been resolved since onset. The problem is controlled. Associated symptoms include malaise/fatigue. Pertinent negatives include no peripheral edema or shortness of breath. The current treatment provides moderate improvement.      Review of Systems  Constitutional:  Positive for malaise/fatigue.  Respiratory:  Negative for shortness of breath.   All other systems reviewed and are negative.      Objective:   Physical Exam Vitals reviewed.  Constitutional:      General: He is not in acute distress.    Appearance: He is well-developed.  HENT:     Head: Normocephalic.  Eyes:     General:        Right eye: No discharge.        Left eye: No discharge.     Pupils: Pupils are equal, round, and reactive to light.  Neck:     Thyroid: No thyromegaly.  Cardiovascular:     Rate and Rhythm: Normal rate and regular rhythm.     Heart sounds: Normal heart sounds. No murmur heard. Pulmonary:     Effort: Pulmonary  effort is normal. No respiratory distress.     Breath sounds: Rhonchi present. No wheezing.  Abdominal:     General: Bowel sounds are normal. There is no distension.     Palpations: Abdomen is soft.     Tenderness: There is no abdominal tenderness.  Musculoskeletal:        General: No tenderness. Normal range of motion.     Cervical back: Normal range of motion and neck supple.  Skin:    General: Skin is warm and dry.     Findings: No erythema or rash.  Neurological:     Mental Status: He is alert and oriented to person, place, and time.     Cranial Nerves: No cranial nerve deficit.     Deep Tendon Reflexes: Reflexes are normal and symmetric.  Psychiatric:        Behavior: Behavior normal.        Thought Content: Thought content normal.        Judgment: Judgment normal.       BP 133/70   Pulse 72   Temp (!) 97.2 F (36.2 C) (Temporal)   Ht 5\' 10"  (1.778 m)   Wt 192 lb 6.4 oz (87.3 kg)   SpO2 95%   BMI 27.61 kg/m  Assessment & Plan:  Jason Spencer comes in today with chief complaint of Anticoagulation   Diagnosis and orders addressed:  1. Atrial fibrillation, new onset ([place]) Description   INR today is 2.8 (goal 2-3) too thin.   Continue 5 mg on Monday and  Friday. 7.5 mg all other days.   Recheck in 4 weeks    - CoaguChek XS/INR Waived  2. Atrial fibrillation with RVR ([place])  3. Essential hypertension, benign  4. Purpura senilis ([place])   Labs pending Health Maintenance reviewed Diet and exercise encouraged  Follow up plan: 1 month   [name], FNP

## 2022-10-05 NOTE — Patient Instructions (Signed)
Description   INR today is 2.8 (goal 2-3) too thin.   Continue 5 mg on Monday and  Friday. 7.5 mg all other days.   Recheck in 4 weeks

## 2022-11-07 ENCOUNTER — Ambulatory Visit (INDEPENDENT_AMBULATORY_CARE_PROVIDER_SITE_OTHER): Payer: Medicare HMO | Admitting: Family

## 2022-11-07 ENCOUNTER — Encounter: Payer: Self-pay | Admitting: Family

## 2022-11-07 VITALS — BP 132/68 | HR 75 | Temp 97.5°F | Ht 70.0 in | Wt 191.0 lb

## 2022-11-07 DIAGNOSIS — J42 Unspecified chronic bronchitis: Secondary | ICD-10-CM | POA: Diagnosis not present

## 2022-11-07 DIAGNOSIS — F1010 Alcohol abuse, uncomplicated: Secondary | ICD-10-CM

## 2022-11-07 DIAGNOSIS — Z7901 Long term (current) use of anticoagulants: Secondary | ICD-10-CM

## 2022-11-07 DIAGNOSIS — I4891 Unspecified atrial fibrillation: Secondary | ICD-10-CM | POA: Diagnosis not present

## 2022-11-07 DIAGNOSIS — I10 Essential (primary) hypertension: Secondary | ICD-10-CM

## 2022-11-07 LAB — COAGUCHEK XS/INR WAIVED
INR: 3.8 — ABNORMAL HIGH (ref 0.9–1.1)
Prothrombin Time: 45.4 s

## 2022-11-07 NOTE — Patient Instructions (Signed)
Description   INR today is 3.8(goal 2-3) too thin.   Hold todays dose, 11/07/22, then decrease to  5 mg on Monday, Wednesday,  and  Friday. 7.5 mg all other days.   Recheck in 4 weeks

## 2022-11-07 NOTE — Progress Notes (Signed)
Subjective:    Patient ID: Jason Spencer, male    DOB: [birth year], 72 y.o.   MRN: [id number]  Chief Complaint  Patient presents with   Anticoagulation   Pt presents to the office today for INR check and chronic follow up. He is currently taking warfarin for A Fib. See anticoagulation flow sheet. Denies any bleeding, extra, or changes in diet.    His INR today is 3.8.  He is currently taking 5 mg  Monday, Friday and 7.5 mg all other days. See anticoagulation flowsheet.    He does have a hx of alcohol abuse and admits to drinking 4-5  larger beers a day. He is followed by Cardiologists for A Fib, CAD, and Cardiomyopathy.    He has COPD and smokes a 1/2 pack and uses a wood stove. He has intermittent SOB and wheezing. He has not used his Telegy recently.    He has atherosclerosis of aorta and takes Crestor 5 mg daily.    He has purpura senilis and takes warfarin. Stable Hypertension This is a chronic problem. The current episode started more than 1 year ago. The problem has been resolved since onset. The problem is controlled. Associated symptoms include malaise/fatigue and shortness of breath. Pertinent negatives include no peripheral edema. Risk factors for coronary artery disease include dyslipidemia, obesity and male gender. The current treatment provides moderate improvement.      Review of Systems  Constitutional:  Positive for malaise/fatigue.  Respiratory:  Positive for shortness of breath.   All other systems reviewed and are negative.      Objective:   Physical Exam Vitals reviewed.  Constitutional:      General: He is not in acute distress.    Appearance: He is well-developed.  HENT:     Head: Normocephalic.  Eyes:     General:        Right eye: No discharge.        Left eye: No discharge.     Pupils: Pupils are equal, round, and reactive to light.  Neck:     Thyroid: No thyromegaly.  Cardiovascular:     Rate and Rhythm: Normal rate and regular rhythm.      Heart sounds: Normal heart sounds. No murmur heard. Pulmonary:     Effort: Pulmonary effort is normal. No respiratory distress.     Breath sounds: Wheezing and rhonchi present.  Abdominal:     General: Bowel sounds are normal. There is no distension.     Palpations: Abdomen is soft.     Tenderness: There is no abdominal tenderness.  Musculoskeletal:        General: No tenderness. Normal range of motion.     Cervical back: Normal range of motion and neck supple.  Skin:    General: Skin is warm and dry.     Findings: No erythema or rash.  Neurological:     Mental Status: He is alert and oriented to person, place, and time.     Cranial Nerves: No cranial nerve deficit.     Deep Tendon Reflexes: Reflexes are normal and symmetric.  Psychiatric:        Behavior: Behavior normal.        Thought Content: Thought content normal.        Judgment: Judgment normal.          BP 132/68   Pulse 75   Temp (!) 97.5 F (36.4 C)   Ht 5\' 10"  (1.778 m)   Wt 191  lb (86.6 kg)   BMI 27.41 kg/m   Assessment & Plan:  Jason Spencer comes in today with chief complaint of Anticoagulation   Diagnosis and orders addressed:  1. Atrial fibrillation, new onset (HCC)  2. Atrial fibrillation with RVR (HCC) - CoaguChek XS/INR Waived  3. Alcohol abuse  4. Chronic bronchitis, unspecified chronic bronchitis type (HCC)  5. Current use of anticoagulant therapy  6. Essential hypertension, benign  Description   INR today is 3.8(goal 2-3) too thin.   Hold todays dose, 11/07/22, then decrease to  5 mg on Monday, Wednesday,  and  Friday. 7.5 mg all other days.   Recheck in 4 weeks     Health Maintenance reviewed Diet and exercise encouraged  Follow up plan: 2 weeks   Jannifer Rodney, FNP

## 2022-11-20 ENCOUNTER — Ambulatory Visit (INDEPENDENT_AMBULATORY_CARE_PROVIDER_SITE_OTHER): Payer: Medicare HMO | Admitting: Family

## 2022-11-20 ENCOUNTER — Encounter: Payer: Self-pay | Admitting: Family

## 2022-11-20 VITALS — BP 128/65 | HR 65 | Temp 97.3°F | Ht 70.0 in | Wt 192.2 lb

## 2022-11-20 DIAGNOSIS — I110 Hypertensive heart disease with heart failure: Secondary | ICD-10-CM

## 2022-11-20 DIAGNOSIS — I5023 Acute on chronic systolic (congestive) heart failure: Secondary | ICD-10-CM

## 2022-11-20 DIAGNOSIS — E785 Hyperlipidemia, unspecified: Secondary | ICD-10-CM

## 2022-11-20 DIAGNOSIS — Z720 Tobacco use: Secondary | ICD-10-CM | POA: Diagnosis not present

## 2022-11-20 DIAGNOSIS — I4891 Unspecified atrial fibrillation: Secondary | ICD-10-CM

## 2022-11-20 DIAGNOSIS — I2510 Atherosclerotic heart disease of native coronary artery without angina pectoris: Secondary | ICD-10-CM

## 2022-11-20 DIAGNOSIS — J42 Unspecified chronic bronchitis: Secondary | ICD-10-CM

## 2022-11-20 DIAGNOSIS — I10 Essential (primary) hypertension: Secondary | ICD-10-CM

## 2022-11-20 DIAGNOSIS — I700 Atherosclerosis of aorta: Secondary | ICD-10-CM

## 2022-11-20 DIAGNOSIS — J441 Chronic obstructive pulmonary disease with (acute) exacerbation: Secondary | ICD-10-CM | POA: Diagnosis not present

## 2022-11-20 DIAGNOSIS — F411 Generalized anxiety disorder: Secondary | ICD-10-CM

## 2022-11-20 LAB — COAGUCHEK XS/INR WAIVED
INR: 3.5 — ABNORMAL HIGH (ref 0.9–1.1)
Prothrombin Time: 42.2 s

## 2022-11-20 MED ORDER — WARFARIN SODIUM 5 MG PO TABS
ORAL_TABLET | ORAL | 2 refills | Status: DC
Start: 2022-11-20 — End: 2023-09-21

## 2022-11-20 MED ORDER — PREDNISONE 10 MG (21) PO TBPK
ORAL_TABLET | ORAL | 0 refills | Status: DC
Start: 2022-11-20 — End: 2023-01-08

## 2022-11-20 NOTE — Patient Instructions (Signed)
Description   INR today is 3.5 (goal 2-3) too thin.   Hold todays dose, 11/20/22, then decrease to  5 mg on Monday, Wednesday,  and  Friday. 7.5 mg all other days.   Recheck in 2 weeks

## 2022-11-20 NOTE — Progress Notes (Signed)
Subjective:    Patient ID: Jason Spencer, male    DOB: [birth year], 72 y.o.   MRN: [id number]  Chief Complaint  Patient presents with   Anticoagulation   Pt presents to the office today for INR check and chronic follow up. He is currently taking warfarin for A Fib. See anticoagulation flow sheet. Denies any bleeding, extra, or changes in diet.    His INR today is 3.5.  He is currently taking 5 mg  Monday, Wednesday, Friday and 7.5 mg all other days. See anticoagulation flowsheet.    He does have a hx of alcohol abuse and admits to drinking 4-5  larger beers a day. He is followed by Cardiologists for A Fib, CAD, and Cardiomyopathy.    He has COPD and smokes a 1/2 pack and uses a wood stove. He has intermittent SOB and wheezing. He has not used his Telegy recently.    He has atherosclerosis of aorta and takes Crestor 5 mg daily.    He has purpura senilis and takes warfarin. Stable Congestive Heart Failure Presents for follow-up visit. Associated symptoms include fatigue. Pertinent negatives include no edema.  Hyperlipidemia This is a chronic problem. The current episode started more than 1 year ago. Factors aggravating his hyperlipidemia include smoking. Current antihyperlipidemic treatment includes statins. The current treatment provides moderate improvement of lipids. Risk factors for coronary artery disease include dyslipidemia, hypertension, male sex and a sedentary lifestyle.      Review of Systems  Constitutional:  Positive for fatigue.  All other systems reviewed and are negative.      Objective:   Physical Exam Vitals reviewed.  Constitutional:      General: He is not in acute distress.    Appearance: He is well-developed.  HENT:     Head: Normocephalic.     Right Ear: Tympanic membrane normal.     Left Ear: Tympanic membrane normal.  Eyes:     General:        Right eye: No discharge.        Left eye: No discharge.     Pupils: Pupils are equal, round, and  reactive to light.  Neck:     Thyroid: No thyromegaly.  Cardiovascular:     Rate and Rhythm: Normal rate and regular rhythm.     Heart sounds: Normal heart sounds. No murmur heard. Pulmonary:     Effort: Pulmonary effort is normal. No respiratory distress.     Breath sounds: Wheezing present.  Abdominal:     General: Bowel sounds are normal. There is no distension.     Palpations: Abdomen is soft.     Tenderness: There is no abdominal tenderness.  Musculoskeletal:        General: No tenderness. Normal range of motion.     Cervical back: Normal range of motion and neck supple.  Skin:    General: Skin is warm and dry.     Findings: No erythema or rash.  Neurological:     Mental Status: He is alert and oriented to person, place, and time.     Cranial Nerves: No cranial nerve deficit.     Deep Tendon Reflexes: Reflexes are normal and symmetric.  Psychiatric:        Behavior: Behavior normal.        Thought Content: Thought content normal.        Judgment: Judgment normal.       BP 128/65   Pulse 65   Temp (!) 97.3 F (  36.3 C) (Temporal)   Ht 5\' 10"  (1.778 m)   Wt 192 lb 3.2 oz (87.2 kg)   SpO2 98%   BMI 27.58 kg/m      Assessment & Plan:  Calon Filar comes in today with chief complaint of Anticoagulation   Diagnosis and orders addressed:  1. Atrial fibrillation, new onset (HCC) - CoaguChek XS/INR Waived - warfarin (COUMADIN) 5 MG tablet; TAKE ONE TABLET DAILY AS DIRECTED  Dispense: 90 tablet; Refill: 2  2. Atrial fibrillation with RVR (HCC)  - warfarin (COUMADIN) 5 MG tablet; TAKE ONE TABLET DAILY AS DIRECTED  Dispense: 90 tablet; Refill: 2  3. Coronary artery disease involving native coronary artery of native heart without angina pectoris - warfarin (COUMADIN) 5 MG tablet; TAKE ONE TABLET DAILY AS DIRECTED  Dispense: 90 tablet; Refill: 2  4. Chronic bronchitis, unspecified chronic bronchitis type (HCC)  5. Essential hypertension, benign  6. GAD  (generalized anxiety disorder)  7. Hyperlipidemia, unspecified hyperlipidemia type  8. Tobacco abuse  9. Atherosclerosis of aorta (HCC)  10. COPD exacerbation (HCC) - Start prednisone  - predniSONE (STERAPRED UNI-PAK 21 TAB) 10 MG (21) TBPK tablet; Use as directed  Dispense: 21 tablet; Refill: 0  11. Acute on chronic systolic CHF (congestive heart failure) (HCC)  12. Atrial fibrillation, unspecified type (HCC) - warfarin (COUMADIN) 5 MG tablet; TAKE ONE TABLET DAILY AS DIRECTED  Dispense: 90 tablet; Refill: 2   Description   INR today is 3.5 (goal 2-3) too thin.   Hold todays dose, 11/20/22, then decrease to  5 mg on Sunday, Monday, Wednesday,  and  Friday. 7.5 mg all other days.   Recheck in 2 weeks     Health Maintenance reviewed Diet and exercise encouraged  Follow up plan: 2 weeks    Jannifer Rodney, FNP

## 2022-12-05 ENCOUNTER — Encounter: Payer: Self-pay | Admitting: Family

## 2022-12-05 ENCOUNTER — Ambulatory Visit (INDEPENDENT_AMBULATORY_CARE_PROVIDER_SITE_OTHER): Payer: Medicare HMO | Admitting: Family

## 2022-12-05 VITALS — BP 143/72 | HR 78 | Temp 97.1°F | Ht 70.0 in | Wt 192.6 lb

## 2022-12-05 DIAGNOSIS — J440 Chronic obstructive pulmonary disease with acute lower respiratory infection: Secondary | ICD-10-CM

## 2022-12-05 DIAGNOSIS — J42 Unspecified chronic bronchitis: Secondary | ICD-10-CM

## 2022-12-05 DIAGNOSIS — D692 Other nonthrombocytopenic purpura: Secondary | ICD-10-CM | POA: Diagnosis not present

## 2022-12-05 DIAGNOSIS — F1010 Alcohol abuse, uncomplicated: Secondary | ICD-10-CM | POA: Diagnosis not present

## 2022-12-05 DIAGNOSIS — I4891 Unspecified atrial fibrillation: Secondary | ICD-10-CM | POA: Diagnosis not present

## 2022-12-05 DIAGNOSIS — Z720 Tobacco use: Secondary | ICD-10-CM

## 2022-12-05 DIAGNOSIS — I10 Essential (primary) hypertension: Secondary | ICD-10-CM | POA: Diagnosis not present

## 2022-12-05 DIAGNOSIS — Z7901 Long term (current) use of anticoagulants: Secondary | ICD-10-CM | POA: Diagnosis not present

## 2022-12-05 LAB — COAGUCHEK XS/INR WAIVED
INR: 2.5 — ABNORMAL HIGH (ref 0.9–1.1)
Prothrombin Time: 29.5 s

## 2022-12-05 NOTE — Patient Instructions (Signed)
Description   INR today is 2.5 (goal 2-3) at goal!   Continue 7.5 mg Tuesday, Thursday, and Saturday, and 5 mg all other days.   Recheck in 4 weeks

## 2022-12-05 NOTE — Progress Notes (Signed)
Subjective:    Patient ID: Jason Spencer, male    DOB: 1950-11-06, 72 y.o.   MRN: [id number]  Chief Complaint  Patient presents with   Anticoagulation   Pt presents to the office today for INR check and chronic follow up. He is currently taking warfarin for A Fib. See anticoagulation flow sheet. Denies any bleeding, extra, or changes in diet.    His INR today is 2.5.  He is currently taking 7.5 mg  Tues, Thurs, and Saturday, and 5 mg all other days. See anticoagulation flowsheet.    He does have a hx of alcohol abuse and admits to drinking 4-5  larger beers a day. He is followed by Cardiologists for A Fib, CAD, and Cardiomyopathy.    He has COPD and smokes a 1/2 pack and uses a wood stove. He has intermittent SOB and wheezing. He has not used his Telegy recently.    He has atherosclerosis of aorta and takes Crestor 5 mg daily.    He has purpura senilis and takes warfarin. Stable Cough This is a chronic problem. The current episode started more than 1 year ago. The problem has been waxing and waning. The problem occurs every few minutes. The cough is Non-productive. Pertinent negatives include no ear congestion, ear pain, shortness of breath or sweats. Risk factors for lung disease include smoking/tobacco exposure. He has tried rest for the symptoms. The treatment provided moderate relief.  Hypertension This is a chronic problem. The current episode started more than 1 year ago. The problem has been resolved since onset. The problem is controlled. Associated symptoms include malaise/fatigue. Pertinent negatives include no peripheral edema, shortness of breath or sweats. Risk factors for coronary artery disease include dyslipidemia and male gender. The current treatment provides moderate improvement.      Review of Systems  Constitutional:  Positive for malaise/fatigue.  HENT:  Negative for ear pain.   Respiratory:  Positive for cough. Negative for shortness of breath.   All other  systems reviewed and are negative.      Objective:   Physical Exam Vitals reviewed.  Constitutional:      General: He is not in acute distress.    Appearance: He is well-developed.  HENT:     Head: Normocephalic.     Right Ear: Tympanic membrane normal.     Left Ear: Tympanic membrane normal.  Eyes:     General:        Right eye: No discharge.        Left eye: No discharge.     Pupils: Pupils are equal, round, and reactive to light.  Neck:     Thyroid: No thyromegaly.  Cardiovascular:     Rate and Rhythm: Normal rate and regular rhythm.     Heart sounds: Normal heart sounds. No murmur heard. Pulmonary:     Effort: Pulmonary effort is normal. No respiratory distress.     Breath sounds: Normal breath sounds. No wheezing.  Abdominal:     General: Bowel sounds are normal. There is no distension.     Palpations: Abdomen is soft.     Tenderness: There is no abdominal tenderness.  Musculoskeletal:        General: No tenderness. Normal range of motion.     Cervical back: Normal range of motion and neck supple.  Skin:    General: Skin is warm and dry.     Findings: No erythema or rash.  Neurological:     Mental Status: He is  alert and oriented to person, place, and time.     Cranial Nerves: No cranial nerve deficit.     Deep Tendon Reflexes: Reflexes are normal and symmetric.  Psychiatric:        Behavior: Behavior normal.        Thought Content: Thought content normal.        Judgment: Judgment normal.       BP (!) 143/72   Pulse 78   Temp (!) 97.1 F (36.2 C) (Temporal)   Ht 5\' 10"  (1.778 m)   Wt 192 lb 9.6 oz (87.4 kg)   SpO2 96%   BMI 27.64 kg/m      Assessment & Plan:  Manton Lather comes in today with chief complaint of Anticoagulation   Diagnosis and orders addressed:  1. Atrial fibrillation, new onset [facility]) Description   INR today is 2.5 (goal 2-3) at goal!   Continue 7.5 mg Tuesday, Thursday, and Saturday, and 5 mg all other days.   Recheck in  4 weeks     - CoaguChek XS/INR Waived  2. Atrial fibrillation with RVR (HCC)  3. Alcohol abuse  4. Chronic bronchitis, unspecified chronic bronchitis type (HCC)  5. Current use of anticoagulant therapy  6. Essential hypertension, benign  7. Tobacco abuse  8. Purpura senilis (HCC)   Continue medications  Health Maintenance reviewed Diet and exercise encouraged  Follow up plan: 1 months    Jannifer Rodney, FNP

## 2023-01-08 ENCOUNTER — Ambulatory Visit (INDEPENDENT_AMBULATORY_CARE_PROVIDER_SITE_OTHER): Payer: Medicare HMO | Admitting: Family

## 2023-01-08 ENCOUNTER — Encounter: Payer: Self-pay | Admitting: Family

## 2023-01-08 VITALS — BP 123/66 | HR 89 | Temp 97.3°F | Ht 70.0 in | Wt 195.8 lb

## 2023-01-08 DIAGNOSIS — Z0001 Encounter for general adult medical examination with abnormal findings: Secondary | ICD-10-CM

## 2023-01-08 DIAGNOSIS — F1010 Alcohol abuse, uncomplicated: Secondary | ICD-10-CM

## 2023-01-08 DIAGNOSIS — Z0000 Encounter for general adult medical examination without abnormal findings: Secondary | ICD-10-CM

## 2023-01-08 DIAGNOSIS — J42 Unspecified chronic bronchitis: Secondary | ICD-10-CM

## 2023-01-08 DIAGNOSIS — I4891 Unspecified atrial fibrillation: Secondary | ICD-10-CM | POA: Diagnosis not present

## 2023-01-08 DIAGNOSIS — I700 Atherosclerosis of aorta: Secondary | ICD-10-CM

## 2023-01-08 DIAGNOSIS — J449 Chronic obstructive pulmonary disease, unspecified: Secondary | ICD-10-CM | POA: Diagnosis not present

## 2023-01-08 DIAGNOSIS — I10 Essential (primary) hypertension: Secondary | ICD-10-CM | POA: Diagnosis not present

## 2023-01-08 DIAGNOSIS — Z7901 Long term (current) use of anticoagulants: Secondary | ICD-10-CM | POA: Diagnosis not present

## 2023-01-08 DIAGNOSIS — E785 Hyperlipidemia, unspecified: Secondary | ICD-10-CM

## 2023-01-08 DIAGNOSIS — I110 Hypertensive heart disease with heart failure: Secondary | ICD-10-CM | POA: Diagnosis not present

## 2023-01-08 DIAGNOSIS — I2510 Atherosclerotic heart disease of native coronary artery without angina pectoris: Secondary | ICD-10-CM | POA: Diagnosis not present

## 2023-01-08 DIAGNOSIS — I5023 Acute on chronic systolic (congestive) heart failure: Secondary | ICD-10-CM | POA: Diagnosis not present

## 2023-01-08 DIAGNOSIS — F411 Generalized anxiety disorder: Secondary | ICD-10-CM

## 2023-01-08 DIAGNOSIS — Z720 Tobacco use: Secondary | ICD-10-CM

## 2023-01-08 DIAGNOSIS — Z125 Encounter for screening for malignant neoplasm of prostate: Secondary | ICD-10-CM | POA: Diagnosis not present

## 2023-01-08 LAB — COAGUCHEK XS/INR WAIVED
INR: 2.5 — ABNORMAL HIGH (ref 0.9–1.1)
Prothrombin Time: 29.5 s

## 2023-01-08 NOTE — Progress Notes (Signed)
Subjective:    Patient ID: Jason Spencer, male    DOB: 1950/10/20, 72 y.o.   MRN: [id number]  Chief Complaint  Patient presents with   Medical Management of Chronic Issues   Anticoagulation   Pt presents to the office today for CPE and INR check and chronic follow up. He is currently taking warfarin for A Fib. See anticoagulation flow sheet. Denies any bleeding, extra, or changes in diet.    His INR today is 2.5.  He is currently taking 7.5 mg Tues, Thurs, and Saturday and 5 mg all other days.  See anticoagulation flowsheet.    He does have a hx of alcohol abuse and admits to drinking 4-5  larger beers a day. He is followed by Cardiologists for A Fib, CAD, and Cardiomyopathy.    He has COPD and smokes a 1/2 pack and uses a wood stove. He has intermittent SOB and wheezing. He has not used his Telegy recently.    He has atherosclerosis of aorta and takes Crestor 5 mg daily.    He has purpura senilis and takes warfarin. Stable Hypertension This is a chronic problem. The current episode started more than 1 year ago. The problem has been resolved since onset. The problem is controlled. Associated symptoms include anxiety. Pertinent negatives include no malaise/fatigue, peripheral edema or shortness of breath. Risk factors for coronary artery disease include dyslipidemia, male gender, sedentary lifestyle and smoking/tobacco exposure. The current treatment provides moderate improvement.  Congestive Heart Failure Presents for follow-up visit. Pertinent negatives include no edema, fatigue or shortness of breath. The symptoms have been stable.  Hyperlipidemia This is a chronic problem. The current episode started more than 1 year ago. The problem is controlled. Exacerbating diseases include obesity. Pertinent negatives include no shortness of breath. Current antihyperlipidemic treatment includes statins. The current treatment provides moderate improvement of lipids. Risk factors for coronary  artery disease include dyslipidemia, hypertension, male sex and a sedentary lifestyle.  Anxiety Presents for follow-up visit. Symptoms include excessive worry and nervous/anxious behavior. Patient reports no shortness of breath. Symptoms occur occasionally.        Review of Systems  Constitutional:  Negative for fatigue and malaise/fatigue.  Respiratory:  Negative for shortness of breath.   Psychiatric/Behavioral:  The patient is nervous/anxious.   All other systems reviewed and are negative.  Family History  Problem Relation Age of Onset   Heart disease Mother        VALUE REPLACEMENT   COPD Father    Aneurysm Father    Social History   Socioeconomic History   Marital status: Single    Spouse name: Not on file   Number of children: Not on file   Years of education: Not on file   Highest education level: Not on file  Occupational History   Not on file  Tobacco Use   Smoking status: Every Day    Packs/day: 0.50    Years: 44.00    Additional pack years: 0.00    Total pack years: 22.00    Types: Cigarettes   Smokeless tobacco: Never  Vaping Use   Vaping Use: Never used  Substance and Sexual Activity   Alcohol use: Yes    Alcohol/week: 0.0 standard drinks of alcohol    Comment: 08/13/2014 "I haven't drank in a month or so; if I drink it would be beer on a warm day"   Drug use: No   Sexual activity: Not Currently  Other Topics Concern   Not on  file  Social History Narrative   Not on file   Social Determinants of Health   Financial Resource Strain: Low Risk  (01/08/2023)   Overall Financial Resource Strain (CARDIA)    Difficulty of Paying Living Expenses: Not hard at all  Food Insecurity: No Food Insecurity (01/08/2023)   Hunger Vital Sign    Worried About Running Out of Food in the Last Year: Never true    Ran Out of Food in the Last Year: Never true  Transportation Needs: No Transportation Needs (01/08/2023)   PRAPARE - Administrator, Civil Service  (Medical): No    Lack of Transportation (Non-Medical): No  Physical Activity: Insufficiently Active (01/08/2023)   Exercise Vital Sign    Days of Exercise per Week: 2 days    Minutes of Exercise per Session: 20 min  Stress: Not on file  Social Connections: Unknown (01/08/2023)   Social Connection and Isolation Panel [NHANES]    Frequency of Communication with Friends and Family: Three times a week    Frequency of Social Gatherings with Friends and Family: Three times a week    Attends Religious Services: 1 to 4 times per year    Active Member of Clubs or Organizations: No    Attends Banker Meetings: 1 to 4 times per year    Marital Status: Not on file       Objective:   Physical Exam Vitals reviewed.  Constitutional:      General: He is not in acute distress.    Appearance: He is well-developed.  HENT:     Head: Normocephalic.     Right Ear: Tympanic membrane normal.     Left Ear: Tympanic membrane normal.  Eyes:     General:        Right eye: No discharge.        Left eye: No discharge.     Pupils: Pupils are equal, round, and reactive to light.  Neck:     Thyroid: No thyromegaly.  Cardiovascular:     Rate and Rhythm: Normal rate and regular rhythm.     Heart sounds: Normal heart sounds. No murmur heard. Pulmonary:     Effort: Pulmonary effort is normal. No respiratory distress.     Breath sounds: Wheezing present.  Abdominal:     General: Bowel sounds are normal. There is no distension.     Palpations: Abdomen is soft.     Tenderness: There is no abdominal tenderness.  Musculoskeletal:        General: No tenderness. Normal range of motion.     Cervical back: Normal range of motion and neck supple.  Skin:    General: Skin is warm and dry.     Findings: No erythema or rash.  Neurological:     Mental Status: He is alert and oriented to person, place, and time.     Cranial Nerves: No cranial nerve deficit.     Deep Tendon Reflexes: Reflexes are normal and  symmetric.  Psychiatric:        Behavior: Behavior normal.        Thought Content: Thought content normal.        Judgment: Judgment normal.      BP 123/66   Pulse 89   Temp (!) 97.3 F (36.3 C) (Temporal)   Ht 5\' 10"  (1.778 m)   Wt 195 lb 12.8 oz (88.8 kg)   SpO2 96%   BMI 28.09 kg/m  Assessment & Plan:  Williard Bonavita comes in today with chief complaint of Medical Management of Chronic Issues and Anticoagulation   Diagnosis and orders addressed:  1. Hyperlipidemia, unspecified hyperlipidemia type - CBC with Differential/Platelet - Lipid panel - TSH - PSA, total and free  2. Atrial fibrillation, new onset (HC - CBC with Differential/Platelet - CoaguChek XS/INR Waived  3. Essential hypertension, benign - CBC with Differential/Platelet - CMP14+EGFR - Lipid panel - TSH - PSA, total and free  4. Atrial fibrillation with RVR (HCC)  5. Acute on chronic systolic CHF (congestive heart failure) (HCC)  6. Alcohol abuse  7. Coronary artery disease involving native coronary artery of native heart without angina pectoris  8. Chronic bronchitis, unspecified chronic bronchitis type (HCC)   9. Current use of anticoagulant therapy  10. GAD (generalized anxiety disorder)   11. Tobacco abuse  12. Annual physical exam   Description   INR today is 2.5 (goal 2-3) at goal!   Continue 7.5 mg Tuesday, Thursday, and Saturday, and 5 mg all other days.   Recheck in 8 weeks     Labs pending Health Maintenance reviewed Diet and exercise encouraged  Follow up plan: 8 weeks    Jannifer Rodney, FNP

## 2023-01-08 NOTE — Patient Instructions (Signed)
Description   INR today is 2.5 (goal 2-3) at goal!   Continue 7.5 mg Tuesday, Thursday, and Saturday, and 5 mg all other days.   Recheck in 4 weeks      

## 2023-01-09 LAB — CBC WITH DIFFERENTIAL/PLATELET
Basophils Absolute: 0 10*3/uL (ref 0.0–0.2)
Basos: 0 %
EOS (ABSOLUTE): 0.2 10*3/uL (ref 0.0–0.4)
Eos: 2 %
Hematocrit: 42 % (ref 37.5–51.0)
Hemoglobin: 14.2 g/dL (ref 13.0–17.7)
Immature Grans (Abs): 0.1 10*3/uL (ref 0.0–0.1)
Immature Granulocytes: 1 %
Lymphocytes Absolute: 1.3 10*3/uL (ref 0.7–3.1)
Lymphs: 17 %
MCH: 34.1 pg — ABNORMAL HIGH (ref 26.6–33.0)
MCHC: 33.8 g/dL (ref 31.5–35.7)
MCV: 101 fL — ABNORMAL HIGH (ref 79–97)
Monocytes Absolute: 1.1 10*3/uL — ABNORMAL HIGH (ref 0.1–0.9)
Monocytes: 14 %
Neutrophils Absolute: 4.9 10*3/uL (ref 1.4–7.0)
Neutrophils: 66 %
Platelets: 201 10*3/uL (ref 150–450)
RBC: 4.17 x10E6/uL (ref 4.14–5.80)
RDW: 12.1 % (ref 11.6–15.4)
WBC: 7.5 10*3/uL (ref 3.4–10.8)

## 2023-01-09 LAB — CMP14+EGFR
ALT: 14 IU/L (ref 0–44)
AST: 23 IU/L (ref 0–40)
Albumin: 3.9 g/dL (ref 3.9–4.9)
Alkaline Phosphatase: 65 IU/L (ref 44–121)
BUN/Creatinine Ratio: 11 (ref 10–24)
BUN: 15 mg/dL (ref 8–27)
Bilirubin Total: 0.6 mg/dL (ref 0.0–1.2)
CO2: 22 mmol/L (ref 20–29)
Calcium: 9.2 mg/dL (ref 8.6–10.2)
Chloride: 100 mmol/L (ref 96–106)
Creatinine, Ser: 1.32 mg/dL — ABNORMAL HIGH (ref 0.76–1.27)
Globulin, Total: 3.6 g/dL (ref 1.5–4.5)
Glucose: 94 mg/dL (ref 70–99)
Potassium: 4.5 mmol/L (ref 3.5–5.2)
Sodium: 137 mmol/L (ref 134–144)
Total Protein: 7.5 g/dL (ref 6.0–8.5)
eGFR: 58 mL/min/{1.73_m2} — ABNORMAL LOW (ref >59)

## 2023-01-09 LAB — PSA, TOTAL AND FREE
PSA, Free Pct: 30 %
PSA, Free: 0.27 ng/mL
Prostate Specific Ag, Serum: 0.9 ng/mL (ref 0.0–4.0)

## 2023-01-09 LAB — TSH: TSH: 4.59 u[IU]/mL — ABNORMAL HIGH (ref 0.450–4.500)

## 2023-01-09 LAB — LIPID PANEL
Chol/HDL Ratio: 4.8 ratio (ref 0.0–5.0)
Cholesterol, Total: 237 mg/dL — ABNORMAL HIGH (ref 100–199)
HDL: 49 mg/dL (ref >39)
LDL Chol Calc (NIH): 148 mg/dL — ABNORMAL HIGH (ref 0–99)
Triglycerides: 222 mg/dL — ABNORMAL HIGH (ref 0–149)
VLDL Cholesterol Cal: 40 mg/dL (ref 5–40)

## 2023-03-09 ENCOUNTER — Ambulatory Visit (INDEPENDENT_AMBULATORY_CARE_PROVIDER_SITE_OTHER): Payer: Medicare HMO | Admitting: Family

## 2023-03-09 ENCOUNTER — Encounter: Payer: Self-pay | Admitting: Family

## 2023-03-09 VITALS — BP 114/70 | HR 83 | Temp 97.7°F | Ht 70.0 in | Wt 191.0 lb

## 2023-03-09 DIAGNOSIS — J441 Chronic obstructive pulmonary disease with (acute) exacerbation: Secondary | ICD-10-CM

## 2023-03-09 DIAGNOSIS — Z720 Tobacco use: Secondary | ICD-10-CM

## 2023-03-09 DIAGNOSIS — F1010 Alcohol abuse, uncomplicated: Secondary | ICD-10-CM | POA: Diagnosis not present

## 2023-03-09 DIAGNOSIS — R7989 Other specified abnormal findings of blood chemistry: Secondary | ICD-10-CM

## 2023-03-09 DIAGNOSIS — I4891 Unspecified atrial fibrillation: Secondary | ICD-10-CM | POA: Diagnosis not present

## 2023-03-09 DIAGNOSIS — I10 Essential (primary) hypertension: Secondary | ICD-10-CM | POA: Diagnosis not present

## 2023-03-09 LAB — COAGUCHEK XS/INR WAIVED
INR: 2.5 — ABNORMAL HIGH (ref 0.9–1.1)
Prothrombin Time: 30.6 s

## 2023-03-09 NOTE — Patient Instructions (Signed)
Description   INR today is 2.5 (goal 2-3) at goal!   Continue 7.5 mg Tuesday, Thursday, and Saturday, and 5 mg all other days.   Recheck in 8 weeks

## 2023-03-09 NOTE — Progress Notes (Signed)
Subjective:    Patient ID: Jason Spencer, male    DOB: 1951/04/25, 72 y.o.   MRN: [id number]  Chief Complaint  Patient presents with   Coagulation Disorder   Pt presents to the office today for INR check and chronic follow up. He is currently taking warfarin for A Fib. See anticoagulation flow sheet. Denies any bleeding, extra, or changes in diet.    His INR today is 2.5.  He is currently taking 7.5 mg Tues, Thurs, and Saturday and 5 mg all other days.  See anticoagulation flowsheet.    He does have a hx of alcohol abuse and admits to drinking 4-5  larger beers a day. He is followed by Cardiologists for A Fib, CAD, and Cardiomyopathy.    He has COPD and smokes a 1/2 pack and uses a wood stove. He has intermittent SOB and wheezing. He has not used his Telegy recently.    He has atherosclerosis of aorta and takes Crestor 5 mg daily.    He has purpura senilis and takes warfarin. Stable Hypertension This is a chronic problem. The current episode started more than 1 year ago. The problem has been resolved since onset. The problem is controlled. Associated symptoms include shortness of breath. Pertinent negatives include no malaise/fatigue or peripheral edema. Risk factors for coronary artery disease include dyslipidemia, obesity, male gender and smoking/tobacco exposure. The current treatment provides moderate improvement. Identifiable causes of hypertension include a thyroid problem.  Thyroid Problem Presents for follow-up visit. Patient reports no diarrhea, fatigue, heat intolerance or hoarse voice.      Review of Systems  Constitutional:  Negative for fatigue and malaise/fatigue.  HENT:  Negative for hoarse voice.   Respiratory:  Positive for shortness of breath.   Gastrointestinal:  Negative for diarrhea.  Endocrine: Negative for heat intolerance.  All other systems reviewed and are negative.      Objective:   Physical Exam Vitals reviewed.  Constitutional:      General:  He is not in acute distress.    Appearance: He is well-developed.  HENT:     Head: Normocephalic.     Right Ear: External ear normal.     Left Ear: External ear normal.  Eyes:     General:        Right eye: No discharge.        Left eye: No discharge.     Pupils: Pupils are equal, round, and reactive to light.  Neck:     Thyroid: No thyromegaly.  Cardiovascular:     Rate and Rhythm: Normal rate and regular rhythm.     Heart sounds: Normal heart sounds. No murmur heard. Pulmonary:     Effort: Pulmonary effort is normal. No respiratory distress.     Breath sounds: Wheezing present.  Abdominal:     General: Bowel sounds are normal. There is no distension.     Palpations: Abdomen is soft.     Tenderness: There is no abdominal tenderness.  Musculoskeletal:        General: No tenderness. Normal range of motion.     Cervical back: Normal range of motion and neck supple.  Skin:    General: Skin is warm and dry.     Findings: No erythema or rash.  Neurological:     Mental Status: He is alert and oriented to person, place, and time.     Cranial Nerves: No cranial nerve deficit.     Deep Tendon Reflexes: Reflexes are normal and symmetric.  Psychiatric:        Behavior: Behavior normal.        Thought Content: Thought content normal.        Judgment: Judgment normal.          BP 114/70   Pulse 83   Temp 97.7 F (36.5 C) (Temporal)   Ht 5\' 10"  (1.778 m)   Wt 191 lb (86.6 kg)   SpO2 96%   BMI 27.41 kg/m   Assessment & Plan:   Jason Spencer comes in today with chief complaint of Coagulation Disorder   Diagnosis and orders addressed:  1. Atrial fibrillation, new onset (HCC) - CoaguChek XS/INR Waived - CMP14+EGFR  2. Atrial fibrillation with RVR (HCC) - CMP14+EGFR  3. Abnormal TSH - CMP14+EGFR - TSH  4. Tobacco abuse - CMP14+EGFR  5. Essential hypertension, benign - CMP14+EGFR  6. COPD exacerbation (HCC) - CMP14+EGFR  7. Alcohol abuse -  CMP14+EGFR   Labs pending Description   INR today is 2.5 (goal 2-3) at goal!   Continue 7.5 mg Tuesday, Thursday, and Saturday, and 5 mg all other days.   Recheck in 8 weeks      Health Maintenance reviewed Diet and exercise encouraged  Follow up plan: 2 months    Jannifer Rodney, FNP

## 2023-03-10 LAB — CMP14+EGFR
ALT: 10 IU/L (ref 0–44)
AST: 20 IU/L (ref 0–40)
Albumin: 3.9 g/dL (ref 3.9–4.9)
Alkaline Phosphatase: 59 IU/L (ref 44–121)
BUN/Creatinine Ratio: 15 (ref 10–24)
BUN: 20 mg/dL (ref 8–27)
Bilirubin Total: 0.4 mg/dL (ref 0.0–1.2)
CO2: 22 mmol/L (ref 20–29)
Calcium: 9 mg/dL (ref 8.6–10.2)
Chloride: 105 mmol/L (ref 96–106)
Creatinine, Ser: 1.35 mg/dL — ABNORMAL HIGH (ref 0.76–1.27)
Globulin, Total: 3.6 g/dL (ref 1.5–4.5)
Glucose: 115 mg/dL — ABNORMAL HIGH (ref 70–99)
Potassium: 4.4 mmol/L (ref 3.5–5.2)
Sodium: 141 mmol/L (ref 134–144)
Total Protein: 7.5 g/dL (ref 6.0–8.5)
eGFR: 57 mL/min/{1.73_m2} — ABNORMAL LOW (ref >59)

## 2023-03-10 LAB — TSH: TSH: 3.58 u[IU]/mL (ref 0.450–4.500)

## 2023-03-20 ENCOUNTER — Ambulatory Visit: Payer: Medicare HMO

## 2023-05-10 ENCOUNTER — Encounter: Payer: Self-pay | Admitting: Family

## 2023-05-10 ENCOUNTER — Ambulatory Visit: Payer: Medicare HMO | Admitting: Family

## 2023-05-10 VITALS — BP 118/71 | HR 90 | Temp 97.4°F | Ht 70.0 in | Wt 198.8 lb

## 2023-05-10 DIAGNOSIS — Z7901 Long term (current) use of anticoagulants: Secondary | ICD-10-CM

## 2023-05-10 DIAGNOSIS — L209 Atopic dermatitis, unspecified: Secondary | ICD-10-CM

## 2023-05-10 DIAGNOSIS — I2510 Atherosclerotic heart disease of native coronary artery without angina pectoris: Secondary | ICD-10-CM

## 2023-05-10 DIAGNOSIS — F1010 Alcohol abuse, uncomplicated: Secondary | ICD-10-CM

## 2023-05-10 DIAGNOSIS — Z720 Tobacco use: Secondary | ICD-10-CM | POA: Diagnosis not present

## 2023-05-10 DIAGNOSIS — F411 Generalized anxiety disorder: Secondary | ICD-10-CM

## 2023-05-10 DIAGNOSIS — I10 Essential (primary) hypertension: Secondary | ICD-10-CM

## 2023-05-10 DIAGNOSIS — J449 Chronic obstructive pulmonary disease, unspecified: Secondary | ICD-10-CM | POA: Diagnosis not present

## 2023-05-10 DIAGNOSIS — E785 Hyperlipidemia, unspecified: Secondary | ICD-10-CM

## 2023-05-10 DIAGNOSIS — D692 Other nonthrombocytopenic purpura: Secondary | ICD-10-CM

## 2023-05-10 DIAGNOSIS — J42 Unspecified chronic bronchitis: Secondary | ICD-10-CM

## 2023-05-10 DIAGNOSIS — I4891 Unspecified atrial fibrillation: Secondary | ICD-10-CM

## 2023-05-10 LAB — COAGUCHEK XS/INR WAIVED
INR: 2.6 — ABNORMAL HIGH (ref 0.9–1.1)
Prothrombin Time: 31.5 s

## 2023-05-10 MED ORDER — TRIAMCINOLONE ACETONIDE 0.5 % EX OINT
1.0000 | TOPICAL_OINTMENT | Freq: Two times a day (BID) | CUTANEOUS | 2 refills | Status: DC
Start: 2023-05-10 — End: 2023-11-06

## 2023-05-10 NOTE — Progress Notes (Signed)
Subjective:    Patient ID: Jason Spencer, male    DOB: [birth year], 72 y.o.   MRN: [id number]  Chief Complaint  Patient presents with   Atrial Fibrillation    Pt presents to the office today for INR check and chronic follow up. He is currently taking warfarin for A Fib. See anticoagulation flow sheet. Denies any bleeding, extra, or changes in diet.    His INR today is 2.6.  He is currently taking 7.5 mg Tues, Thurs, and Saturday and 5 mg all other days.  See anticoagulation flowsheet.    He does have a hx of alcohol abuse and admits to drinking 4-5  larger beers a day. He is followed by Cardiologists for A Fib, CAD, and Cardiomyopathy.    He has COPD and smokes a 1/2 pack and uses a wood stove. He has intermittent SOB and wheezing. He has not used his Telegy recently.    He has atherosclerosis of aorta and takes Crestor 5 mg daily.    He has purpura senilis and takes warfarin. Stable Atrial Fibrillation Symptoms include hypertension and shortness of breath. Past medical history includes atrial fibrillation and hyperlipidemia.  Hypertension This is a chronic problem. The current episode started more than 1 year ago. The problem has been resolved since onset. The problem is controlled. Associated symptoms include anxiety, malaise/fatigue and shortness of breath. Pertinent negatives include no peripheral edema or PND. Risk factors for coronary artery disease include dyslipidemia, male gender and sedentary lifestyle. The current treatment provides moderate improvement.  Nicotine Dependence Presents for follow-up visit. Symptoms include insomnia. His urge triggers include company of smokers. The symptoms have been stable. He smokes < 1/2 a pack of cigarettes per day.  Hyperlipidemia This is a chronic problem. The current episode started more than 1 year ago. Exacerbating diseases include obesity. Associated symptoms include shortness of breath. Current antihyperlipidemic treatment includes  statins. The current treatment provides moderate improvement of lipids. Risk factors for coronary artery disease include dyslipidemia, diabetes mellitus, hypertension and a sedentary lifestyle.  Rash This is a chronic problem. The current episode started more than 1 year ago. The affected locations include the left arm and right arm. The rash is characterized by itchiness and redness. Associated symptoms include shortness of breath.  Anxiety Presents for follow-up visit. Symptoms include excessive worry, insomnia, nervous/anxious behavior, restlessness and shortness of breath. Symptoms occur occasionally.        Review of Systems  Constitutional:  Positive for malaise/fatigue.  Respiratory:  Positive for shortness of breath.   Cardiovascular:  Negative for PND.  Skin:  Positive for rash.  Psychiatric/Behavioral:  The patient is nervous/anxious and has insomnia.   All other systems reviewed and are negative.      Objective:   Physical Exam Vitals reviewed.  Constitutional:      General: He is not in acute distress.    Appearance: He is well-developed.  HENT:     Head: Normocephalic.     Right Ear: Tympanic membrane normal.     Left Ear: Tympanic membrane normal.  Eyes:     General:        Right eye: No discharge.        Left eye: No discharge.     Pupils: Pupils are equal, round, and reactive to light.  Neck:     Thyroid: No thyromegaly.  Cardiovascular:     Rate and Rhythm: Normal rate and regular rhythm.     Heart sounds: Normal heart sounds.  No murmur heard. Pulmonary:     Effort: Pulmonary effort is normal. No respiratory distress.     Breath sounds: Wheezing and rhonchi present.  Abdominal:     General: Bowel sounds are normal. There is no distension.     Palpations: Abdomen is soft.     Tenderness: There is no abdominal tenderness.  Musculoskeletal:        General: No tenderness. Normal range of motion.     Cervical back: Normal range of motion and neck supple.   Skin:    General: Skin is warm and dry.     Findings: Erythema present. No rash.     Comments: Bilateral wrist erythemas   Neurological:     Mental Status: He is alert and oriented to person, place, and time.     Cranial Nerves: No cranial nerve deficit.     Deep Tendon Reflexes: Reflexes are normal and symmetric.  Psychiatric:        Behavior: Behavior normal.        Thought Content: Thought content normal.        Judgment: Judgment normal.       BP 118/71   Pulse 90   Temp (!) 97.4 F (36.3 C)   Ht 5\' 10"  (1.778 m)   Wt 198 lb 12.8 oz (90.2 kg)   SpO2 96%   BMI 28.52 kg/m      Assessment & Plan:  Jason Spencer comes in today with chief complaint of Atrial Fibrillation   Diagnosis and orders addressed:  1. Atrial fibrillation, new onset (HCC) - CoaguChek XS/INR Waived Description   INR today is 2.6 (goal 2-3) at goal!   Continue 7.5 mg Tuesday, Thursday, and Saturday, and 5 mg all other days.   Recheck in 8 weeks       2. Atrial fibrillation with RVR (HCC)  3. Alcohol abuse  4. Coronary artery disease involving native coronary artery of native heart without angina pectoris  5. Chronic bronchitis, unspecified chronic bronchitis type (HCC)  6. Current use of anticoagulant therapy  7. Essential hypertension, benign  8. Hyperlipidemia, unspecified hyperlipidemia type  9. Purpura senilis (HCC)   10. Tobacco abuse  11. GAD (generalized anxiety disorder)  12. Atopic dermatitis, unspecified type -Avoid scratching  Keep clean and dry - triamcinolone ointment (KENALOG) 0.5 %; Apply 1 Application topically 2 (two) times daily.  Dispense: 60 g; Refill: 2   Labs pending Health Maintenance reviewed Diet and exercise encouraged  Follow up plan: 2 months    Jannifer Rodney, FNP

## 2023-05-10 NOTE — Patient Instructions (Signed)
Description   INR today is 2.6 (goal 2-3) at goal!   Continue 7.5 mg Tuesday, Thursday, and Saturday, and 5 mg all other days.   Recheck in 8 weeks

## 2023-07-06 ENCOUNTER — Ambulatory Visit: Payer: Medicare HMO | Admitting: Family

## 2023-07-06 ENCOUNTER — Encounter: Payer: Self-pay | Admitting: Family Medicine

## 2023-07-06 ENCOUNTER — Ambulatory Visit (INDEPENDENT_AMBULATORY_CARE_PROVIDER_SITE_OTHER): Payer: Medicare HMO | Admitting: Family Medicine

## 2023-07-06 VITALS — BP 131/77 | HR 74 | Temp 97.6°F | Ht 70.0 in | Wt 199.0 lb

## 2023-07-06 DIAGNOSIS — Z7901 Long term (current) use of anticoagulants: Secondary | ICD-10-CM

## 2023-07-06 DIAGNOSIS — R200 Anesthesia of skin: Secondary | ICD-10-CM | POA: Diagnosis not present

## 2023-07-06 DIAGNOSIS — I4891 Unspecified atrial fibrillation: Secondary | ICD-10-CM | POA: Diagnosis not present

## 2023-07-06 LAB — COAGUCHEK XS/INR WAIVED
INR: 2.5 — ABNORMAL HIGH (ref 0.9–1.1)
Prothrombin Time: 29.9 s

## 2023-07-06 LAB — POCT INR: INR: 2.5 (ref 2.0–3.0)

## 2023-07-06 MED ORDER — LIDOCAINE 4 % EX PTCH
2.0000 | MEDICATED_PATCH | CUTANEOUS | 0 refills | Status: DC
Start: 2023-07-06 — End: 2023-11-06

## 2023-07-06 NOTE — Progress Notes (Signed)
Subjective:  Patient ID: Jason Spencer, male    DOB: [birth year], 72 y.o.   MRN: [id number]  Patient Care Team: [name], FNP as PCP - General (Family Medicine) Rollene Rotunda, MD as PCP - Cardiology (Cardiology)   Chief Complaint:  Coagulation Disorder   HPI: Jason Spencer is a 72 y.o. male presenting on 07/06/2023 for Coagulation Disorder   HPI 1. Current use of anticoagulant therapy Denies any signs of bleeding, changes to diet, medications. Denies any hospitalizations or upcoming procedures.  See anticoag track   2. Numbness in bilateral feet   States that he has pain in his left foot. States that sometimes in his right foot. States that it feels like he is wearing a thick pair of socks on. Wonders if this is gout, but he has not history of gout. Endorses slight numbness and tingling. Worse at night when he is lying down. States that it feels like he is walking without cushion. Denies any swelling and pain. Denies any ulcers and warts. Denies change to temperature. States that it is slightly red.    Relevant past medical, surgical, family, and social history reviewed and updated as indicated.  Allergies and medications reviewed and updated. Data reviewed: Chart in Epic.   Past Medical History:  Diagnosis Date   Acute CHF (HCC)    [name] 08/13/2014   Atrial fibrillation with RVR (HCC) 08/13/2014   new onset/notes 08/13/2014   CAP (community acquired pneumonia) 07/2014   Cardiomyopathy (HCC)    Migraine 1970     Past Surgical History:  Procedure Laterality Date   CARDIOVERSION N/A 08/19/2014   Procedure: CARDIOVERSION;  Surgeon: Laurey Morale, MD;  Location: [facility] ENDOSCOPY;  Service: Cardiovascular;  Laterality: N/A;   ENDOVENOUS ABLATION SAPHENOUS VEIN W/ LASER Right 05/01/2018   endovenous laser ablation right greater saphenous vein by [name] MD    LEFT AND RIGHT HEART CATHETERIZATION WITH CORONARY ANGIOGRAM N/A 08/17/2014   Procedure: LEFT AND RIGHT  HEART CATHETERIZATION WITH CORONARY ANGIOGRAM;  Surgeon: Lesleigh Noe, MD;  Location: [facility] CATH LAB;  Service: Cardiovascular;  Laterality: N/A;   NO PAST SURGERIES     PERCUTANEOUS CORONARY STENT INTERVENTION (PCI-S)  08/17/2014   Procedure: PERCUTANEOUS CORONARY STENT INTERVENTION (PCI-S);  Surgeon: Lesleigh Noe, MD;  Location: [facility] CATH LAB;  Service: Cardiovascular;;   TEE WITHOUT CARDIOVERSION N/A 08/19/2014   Procedure: TRANSESOPHAGEAL ECHOCARDIOGRAM (TEE);  Surgeon: Laurey Morale, MD;  Location: [facility] ENDOSCOPY;  Service: Cardiovascular;  Laterality: N/A;    Social History   Socioeconomic History   Marital status: Single    Spouse name: Not on file   Number of children: Not on file   Years of education: Not on file   Highest education level: Not on file  Occupational History   Not on file  Tobacco Use   Smoking status: Every Day    Current packs/day: 0.50    Average packs/day: 0.5 packs/day for 44.0 years (22.0 ttl pk-yrs)    Types: Cigarettes   Smokeless tobacco: Never  Vaping Use   Vaping status: Never Used  Substance and Sexual Activity   Alcohol use: Yes    Alcohol/week: 0.0 standard drinks of alcohol    Comment: 08/13/2014 "I haven't drank in a month or so; if I drink it would be beer on a warm day"   Drug use: No   Sexual activity: Not Currently  Other Topics Concern   Not on file  Social History  Narrative   Not on file   Social Drivers of Health   Financial Resource Strain: Low Risk  (01/08/2023)   Overall Financial Resource Strain (CARDIA)    Difficulty of Paying Living Expenses: Not hard at all  Food Insecurity: No Food Insecurity (01/08/2023)   Hunger Vital Sign    Worried About Running Out of Food in the Last Year: Never true    Ran Out of Food in the Last Year: Never true  Transportation Needs: No Transportation Needs (01/08/2023)   PRAPARE - Administrator, Civil Service (Medical): No    Lack of Transportation (Non-Medical): No  Physical  Activity: Insufficiently Active (01/08/2023)   Exercise Vital Sign    Days of Exercise per Week: 2 days    Minutes of Exercise per Session: 20 min  Stress: Not on file  Social Connections: Unknown (01/08/2023)   Social Connection and Isolation Panel [NHANES]    Frequency of Communication with Friends and Family: Three times a week    Frequency of Social Gatherings with Friends and Family: Three times a week    Attends Religious Services: 1 to 4 times per year    Active Member of Clubs or Organizations: No    Attends Banker Meetings: 1 to 4 times per year    Marital Status: Not on file  Intimate Partner Violence: Not At Risk (01/08/2023)   Humiliation, Afraid, Rape, and Kick questionnaire    Fear of Current or Ex-Partner: No    Emotionally Abused: No    Physically Abused: No    Sexually Abused: No    Outpatient Encounter Medications as of 07/06/2023  Medication Sig   amLODipine (NORVASC) 5 MG tablet Take 1 tablet (5 mg total) by mouth daily.   Budeson-Glycopyrrol-Formoterol (BREZTRI AEROSPHERE) 160-9-4.8 MCG/ACT AERO Inhale 2 puffs into the lungs 2 (two) times daily.   lisinopril (ZESTRIL) 40 MG tablet Take 1 tablet (40 mg total) by mouth daily.   metoprolol succinate (TOPROL-XL) 100 MG 24 hr tablet TAKE ONE TABLET ONCE DAILY WITH OR IMMEDIATELY FOLLOWING A MEAL   rosuvastatin (CRESTOR) 5 MG tablet Take 1 tablet (5 mg total) by mouth daily.   triamcinolone ointment (KENALOG) 0.5 % Apply 1 Application topically 2 (two) times daily.   warfarin (COUMADIN) 5 MG tablet TAKE ONE TABLET DAILY AS DIRECTED   No facility-administered encounter medications on file as of 07/06/2023.    Allergies  Allergen Reactions   Shellfish Allergy Nausea And Vomiting    Review of Systems As per HPI  Objective:  BP (!) 145/79   Pulse 74   Temp 97.6 F (36.4 C)   Ht 5\' 10"  (1.778 m)   Wt 199 lb (90.3 kg)   SpO2 95%   BMI 28.55 kg/m    Wt Readings from Last 3 Encounters:  07/06/23 199  lb (90.3 kg)  05/10/23 198 lb 12.8 oz (90.2 kg)  03/09/23 191 lb (86.6 kg)   Physical Exam Constitutional:      General: He is awake. He is not in acute distress.    Appearance: Normal appearance. He is well-developed and well-groomed. He is not ill-appearing, toxic-appearing or diaphoretic.  Cardiovascular:     Rate and Rhythm: Normal rate and regular rhythm.     Pulses: Normal pulses.          Radial pulses are 2+ on the right side and 2+ on the left side.       Posterior tibial pulses are 2+ on the  right side and 2+ on the left side.     Heart sounds: Normal heart sounds. No murmur heard.    No gallop.  Pulmonary:     Effort: Pulmonary effort is normal. No respiratory distress.     Breath sounds: Normal breath sounds. No stridor. No wheezing, rhonchi or rales.  Musculoskeletal:     Cervical back: Full passive range of motion without pain and neck supple.     Right lower leg: No edema.     Left lower leg: No edema.  Feet:     Comments: Patient refused foot exam  Skin:    General: Skin is warm.     Capillary Refill: Capillary refill takes less than 2 seconds.  Neurological:     General: No focal deficit present.     Mental Status: He is alert, oriented to person, place, and time and easily aroused. Mental status is at baseline.     GCS: GCS eye subscore is 4. GCS verbal subscore is 5. GCS motor subscore is 6.     Motor: No weakness.  Psychiatric:        Attention and Perception: Attention and perception normal.        Mood and Affect: Mood and affect normal.        Speech: Speech normal.        Behavior: Behavior normal. Behavior is cooperative.        Thought Content: Thought content normal. Thought content does not include homicidal or suicidal ideation. Thought content does not include homicidal or suicidal plan.        Cognition and Memory: Cognition and memory normal.        Judgment: Judgment normal.     Results for orders placed or performed in visit on 05/10/23   CoaguChek XS/INR Waived   Collection Time: 05/10/23 11:22 AM  Result Value Ref Range   INR 2.6 (H) 0.9 - 1.1   Prothrombin Time 31.5 sec       07/06/2023   11:24 AM 05/10/2023   11:17 AM 03/09/2023   11:33 AM 01/08/2023   11:45 AM 11/20/2022   10:01 AM  Depression screen PHQ 2/9  Decreased Interest 0 0 0 0 0  Down, Depressed, Hopeless 0 0 0 0 0  PHQ - 2 Score 0 0 0 0 0  Altered sleeping    0   Tired, decreased energy    0   Change in appetite    0   Feeling bad or failure about yourself     0   Trouble concentrating    0   Moving slowly or fidgety/restless    0   Suicidal thoughts    0   PHQ-9 Score    0   Difficult doing work/chores    Not difficult at all        07/06/2023   11:24 AM 09/15/2022   10:52 AM 04/06/2022   11:05 AM 01/31/2021   10:59 AM  GAD 7 : Generalized Anxiety Score  Nervous, Anxious, on Edge 0 0 0 0  Control/stop worrying 0 0 0 1  Worry too much - different things 0 0 0 1  Trouble relaxing 0 0 0 0  Restless 0 0 0 0  Easily annoyed or irritable 0 0 0 0  Afraid - awful might happen 0 0 0 0  Total GAD 7 Score 0 0 0 2  Anxiety Difficulty Not difficult at all Not difficult at all Not difficult at  all Not difficult at all    Pertinent labs & imaging results that were available during my care of the patient were reviewed by me and considered in my medical decision making.  Assessment & Plan:  Jason Spencer was seen today for coagulation disorder.  Diagnoses and all orders for this visit:  Current use of anticoagulant therapy Description   INR today is 2.5 (goal 2-3) at goal!   Continue 7.5 mg Tuesday, Thursday, and Saturday, and 5 mg all other days.   Recheck in 8 weeks    -     CoaguChek XS/INR Waived -     POCT INR  Atrial fibrillation with RVR (HCC) As above.  -     POCT INR  Numbness of foot Discussed with patient that symptoms did not correlate to gout flare. Discussed that based on history differentials included PAD, neuropathy, other  vascular conditions. Patient refused exam of feet. Encouraged patient to follow up with PCP. Will provide lidocaine patches as below. However, will not start controlled such as lyrica. Will not start gabapentin at this time given inability to examine patient and GFR decline.  -     lidocaine 4 %; Place 2 patches onto the skin daily.     Continue all other maintenance medications.  Follow up plan: Return in about 8 weeks (around 08/31/2023) for INR check .   Continue healthy lifestyle choices, including diet (rich in fruits, vegetables, and lean proteins, and low in salt and simple carbohydrates) and exercise (at least 30 minutes of moderate physical activity daily).  Written and verbal instructions provided   The above assessment and management plan was discussed with the patient. The patient verbalized understanding of and has agreed to the management plan. Patient is aware to call the clinic if they develop any new symptoms or if symptoms persist or worsen. Patient is aware when to return to the clinic for a follow-up visit. Patient educated on when it is appropriate to go to the emergency department.   Neale Burly, DNP-FNP [facility] [address] [place], [place] [address] [phone]

## 2023-07-06 NOTE — Patient Instructions (Signed)
Description   INR today is 2.5 (goal 2-3) at goal!   Continue 7.5 mg Tuesday, Thursday, and Saturday, and 5 mg all other days.   Recheck in 8 weeks

## 2023-07-06 NOTE — Progress Notes (Signed)
Discussed with patient in office.  Continue current regimen. Repeat in 8 weeks.

## 2023-07-27 ENCOUNTER — Telehealth: Payer: Self-pay | Admitting: Family Medicine

## 2023-07-27 ENCOUNTER — Other Ambulatory Visit: Payer: Self-pay | Admitting: Family Medicine

## 2023-07-27 NOTE — Telephone Encounter (Unsigned)
Copied from CRM [id number]. Topic: Clinical - Prescription Issue >> Jul 27, 2023 12:12 PM Shelah Lewandowsky wrote: Reason for CRM: Diane with [facility] calling, patient had called about a patch that was supposed to be called in for his toe, they have not received any order, please call to confirm [phone]

## 2023-07-30 NOTE — Telephone Encounter (Signed)
Spoke with Diane at pharmacy they have rx that was sent 12/27 for patches but pt came in 1/17 trying to get them and she was not sure if that is what he was referring to. Informed Diane that is the rx he should get per provider

## 2023-09-06 ENCOUNTER — Ambulatory Visit (INDEPENDENT_AMBULATORY_CARE_PROVIDER_SITE_OTHER): Payer: Medicare HMO | Admitting: Family

## 2023-09-06 ENCOUNTER — Encounter: Payer: Self-pay | Admitting: Family

## 2023-09-06 VITALS — BP 125/72 | HR 85 | Temp 97.6°F | Ht 70.0 in | Wt 196.8 lb

## 2023-09-06 DIAGNOSIS — I10 Essential (primary) hypertension: Secondary | ICD-10-CM | POA: Diagnosis not present

## 2023-09-06 DIAGNOSIS — Z1211 Encounter for screening for malignant neoplasm of colon: Secondary | ICD-10-CM

## 2023-09-06 DIAGNOSIS — F411 Generalized anxiety disorder: Secondary | ICD-10-CM | POA: Diagnosis not present

## 2023-09-06 DIAGNOSIS — D692 Other nonthrombocytopenic purpura: Secondary | ICD-10-CM

## 2023-09-06 DIAGNOSIS — Z7901 Long term (current) use of anticoagulants: Secondary | ICD-10-CM

## 2023-09-06 DIAGNOSIS — J4489 Other specified chronic obstructive pulmonary disease: Secondary | ICD-10-CM | POA: Diagnosis not present

## 2023-09-06 DIAGNOSIS — E785 Hyperlipidemia, unspecified: Secondary | ICD-10-CM

## 2023-09-06 DIAGNOSIS — Z720 Tobacco use: Secondary | ICD-10-CM

## 2023-09-06 DIAGNOSIS — I4891 Unspecified atrial fibrillation: Secondary | ICD-10-CM | POA: Diagnosis not present

## 2023-09-06 DIAGNOSIS — F1010 Alcohol abuse, uncomplicated: Secondary | ICD-10-CM

## 2023-09-06 DIAGNOSIS — I2510 Atherosclerotic heart disease of native coronary artery without angina pectoris: Secondary | ICD-10-CM

## 2023-09-06 DIAGNOSIS — J42 Unspecified chronic bronchitis: Secondary | ICD-10-CM

## 2023-09-06 LAB — COAGUCHEK XS/INR WAIVED
INR: 3.4 — ABNORMAL HIGH (ref 0.9–1.1)
Prothrombin Time: 40.5 s

## 2023-09-06 NOTE — Progress Notes (Signed)
 Subjective:    Patient ID: Jason Spencer, male    DOB: Oct 11, 1950, 73 y.o.   MRN: [id number]  Chief Complaint  Patient presents with   Anticoagulation   Medical Management of Chronic Issues   Foot Injury    Left foot    Pt presents to the office today for INR check and chronic follow up. He is currently taking warfarin for A Fib. See anticoagulation flow sheet. Denies any bleeding, extra, or changes in diet.    His INR today is 3.4.  He is currently taking 7.5 mg Tues, Thurs, and Saturday and 5 mg all other days.  See anticoagulation flowsheet.    He does have a hx of alcohol abuse and states he has not drank since May 23, 2023. He is followed by Cardiologists for A Fib, CAD, and Cardiomyopathy.    He has COPD and smokes a 1/2 pack and uses a wood stove. He has intermittent SOB and wheezing. He has not used his Telegy recently.    He has atherosclerosis of aorta and takes Crestor 5 mg daily.    He has purpura senilis and takes warfarin. Stable Hypertension This is a chronic problem. The current episode started more than 1 year ago. The problem has been resolved since onset. The problem is controlled. Associated symptoms include anxiety. Pertinent negatives include no malaise/fatigue, peripheral edema or shortness of breath. Risk factors for coronary artery disease include dyslipidemia, male gender, sedentary lifestyle and smoking/tobacco exposure. The current treatment provides moderate improvement.  Congestive Heart Failure Presents for follow-up visit. Pertinent negatives include no edema, fatigue or shortness of breath. The symptoms have been stable.  Hyperlipidemia This is a chronic problem. The current episode started more than 1 year ago. The problem is uncontrolled. Recent lipid tests were reviewed and are high. Exacerbating diseases include obesity. Pertinent negatives include no shortness of breath. Current antihyperlipidemic treatment includes statins. The current  treatment provides moderate improvement of lipids. Risk factors for coronary artery disease include dyslipidemia, hypertension, male sex and a sedentary lifestyle.  Anxiety Presents for follow-up visit. Symptoms include excessive worry and nervous/anxious behavior. Patient reports no shortness of breath. Symptoms occur occasionally.    Foot Injury  The incident occurred more than 1 week ago. The injury mechanism was a twisting injury. The pain is present in the left ankle. The pain is at a severity of 3/10. The pain is mild. The pain has been Intermittent since onset. He reports no foreign bodies present. The symptoms are aggravated by weight bearing. He has tried acetaminophen for the symptoms. The treatment provided mild relief.      Review of Systems  Constitutional:  Negative for fatigue and malaise/fatigue.  Respiratory:  Negative for shortness of breath.   Psychiatric/Behavioral:  The patient is nervous/anxious.   All other systems reviewed and are negative.  Family History  Problem Relation Age of Onset   Heart disease Mother        VALUE REPLACEMENT   COPD Father    Aneurysm Father    Social History   Socioeconomic History   Marital status: Single    Spouse name: Not on file   Number of children: Not on file   Years of education: Not on file   Highest education level: Not on file  Occupational History   Not on file  Tobacco Use   Smoking status: Every Day    Current packs/day: 0.50    Average packs/day: 0.5 packs/day for 44.0 years (22.0 ttl  pk-yrs)    Types: Cigarettes   Smokeless tobacco: Never  Vaping Use   Vaping status: Never Used  Substance and Sexual Activity   Alcohol use: Yes    Alcohol/week: 0.0 standard drinks of alcohol    Comment: 08/13/2014 "I haven't drank in a month or so; if I drink it would be beer on a warm day"   Drug use: No   Sexual activity: Not Currently  Other Topics Concern   Not on file  Social History Narrative   Not on file    Social Drivers of Health   Financial Resource Strain: Low Risk  (01/08/2023)   Overall Financial Resource Strain (CARDIA)    Difficulty of Paying Living Expenses: Not hard at all  Food Insecurity: No Food Insecurity (01/08/2023)   Hunger Vital Sign    Worried About Running Out of Food in the Last Year: Never true    Ran Out of Food in the Last Year: Never true  Transportation Needs: No Transportation Needs (01/08/2023)   PRAPARE - Administrator, Civil Service (Medical): No    Lack of Transportation (Non-Medical): No  Physical Activity: Insufficiently Active (01/08/2023)   Exercise Vital Sign    Days of Exercise per Week: 2 days    Minutes of Exercise per Session: 20 min  Stress: Not on file  Social Connections: Unknown (01/08/2023)   Social Connection and Isolation Panel [NHANES]    Frequency of Communication with Friends and Family: Three times a week    Frequency of Social Gatherings with Friends and Family: Three times a week    Attends Religious Services: 1 to 4 times per year    Active Member of Clubs or Organizations: No    Attends Banker Meetings: 1 to 4 times per year    Marital Status: Not on file       Objective:   Physical Exam Vitals reviewed.  Constitutional:      General: He is not in acute distress.    Appearance: He is well-developed.  HENT:     Head: Normocephalic.     Right Ear: Tympanic membrane normal.     Left Ear: Tympanic membrane normal.  Eyes:     General:        Right eye: No discharge.        Left eye: No discharge.     Pupils: Pupils are equal, round, and reactive to light.  Neck:     Thyroid: No thyromegaly.  Cardiovascular:     Rate and Rhythm: Normal rate and regular rhythm.     Heart sounds: Normal heart sounds. No murmur heard. Pulmonary:     Effort: Pulmonary effort is normal. No respiratory distress.     Breath sounds: Wheezing and rhonchi present.  Abdominal:     General: Bowel sounds are normal. There is no  distension.     Palpations: Abdomen is soft.     Tenderness: There is no abdominal tenderness.  Musculoskeletal:        General: No tenderness. Normal range of motion.     Cervical back: Normal range of motion and neck supple.  Skin:    General: Skin is warm and dry.     Findings: No erythema or rash.  Neurological:     Mental Status: He is alert and oriented to person, place, and time.     Cranial Nerves: No cranial nerve deficit.     Deep Tendon Reflexes: Reflexes are normal and symmetric.  Psychiatric:        Behavior: Behavior normal.        Thought Content: Thought content normal.        Judgment: Judgment normal.      BP 125/72   Pulse 85   Temp 97.6 F (36.4 C) (Temporal)   Ht 5\' 10"  (1.778 m)   Wt 196 lb 12.8 oz (89.3 kg)   SpO2 95%   BMI 28.24 kg/m       Assessment & Plan:  Ramir Malerba comes in today with chief complaint of Anticoagulation, Medical Management of Chronic Issues, and Foot Injury (Left foot )   Diagnosis and orders addressed:  1. Atrial fibrillation with RVR (HCC) (Primary) - CoaguChek XS/INR Waived  2. Essential hypertension, benign - CBC with Differential/Platelet - CMP14+EGFR  3. Hyperlipidemia, unspecified hyperlipidemia type - Lipid panel  4. Screening for colon cancer - Cologuard  5. Tobacco abuse  6. Purpura senilis (HCC)  7. GAD (generalized anxiety disorder)  8. Current use of anticoagulant therapy Description   INR today is 3.4 (goal 2-3) too thin!  Hold tomorrow's dose, then Continue 7.5 mg Tuesday, Thursday, and Saturday, and 5 mg all other days.   Recheck in 2 weeks      9. Chronic bronchitis, unspecified chronic bronchitis type (HCC)  10. Coronary artery disease involving native coronary artery of native heart without angina pectoris  11. Alcohol abuse  Labs pending Continue current medications  Health Maintenance reviewed Diet and exercise encouraged  Follow up plan: 8 weeks    Jannifer Rodney,  FNP

## 2023-09-06 NOTE — Patient Instructions (Signed)
 Description   INR today is 3.4 (goal 2-3) too thin!  Hold tomorrow's dose, then Continue 7.5 mg Tuesday, Thursday, and Saturday, and 5 mg all other days.   Recheck in 2 weeks

## 2023-09-07 LAB — CBC WITH DIFFERENTIAL/PLATELET
Basophils Absolute: 0 10*3/uL (ref 0.0–0.2)
Basos: 0 %
EOS (ABSOLUTE): 0.1 10*3/uL (ref 0.0–0.4)
Eos: 1 %
Hematocrit: 39.5 % (ref 37.5–51.0)
Hemoglobin: 13.2 g/dL (ref 13.0–17.7)
Immature Grans (Abs): 0.1 10*3/uL (ref 0.0–0.1)
Immature Granulocytes: 1 %
Lymphocytes Absolute: 1 10*3/uL (ref 0.7–3.1)
Lymphs: 13 %
MCH: 31.8 pg (ref 26.6–33.0)
MCHC: 33.4 g/dL (ref 31.5–35.7)
MCV: 95 fL (ref 79–97)
Monocytes Absolute: 1 10*3/uL — ABNORMAL HIGH (ref 0.1–0.9)
Monocytes: 13 %
Neutrophils Absolute: 5.7 10*3/uL (ref 1.4–7.0)
Neutrophils: 72 %
Platelets: 254 10*3/uL (ref 150–450)
RBC: 4.15 x10E6/uL (ref 4.14–5.80)
RDW: 11.5 % — ABNORMAL LOW (ref 11.6–15.4)
WBC: 7.9 10*3/uL (ref 3.4–10.8)

## 2023-09-07 LAB — CMP14+EGFR
ALT: 11 [IU]/L (ref 0–44)
AST: 19 [IU]/L (ref 0–40)
Albumin: 3.7 g/dL — ABNORMAL LOW (ref 3.9–4.9)
Alkaline Phosphatase: 70 [IU]/L (ref 44–121)
BUN/Creatinine Ratio: 11 (ref 10–24)
BUN: 11 mg/dL (ref 8–27)
Bilirubin Total: 0.5 mg/dL (ref 0.0–1.2)
CO2: 23 mmol/L (ref 20–29)
Calcium: 9 mg/dL (ref 8.6–10.2)
Chloride: 104 mmol/L (ref 96–106)
Creatinine, Ser: 0.97 mg/dL (ref 0.76–1.27)
Globulin, Total: 3.7 g/dL (ref 1.5–4.5)
Glucose: 108 mg/dL — ABNORMAL HIGH (ref 70–99)
Potassium: 4 mmol/L (ref 3.5–5.2)
Sodium: 142 mmol/L (ref 134–144)
Total Protein: 7.4 g/dL (ref 6.0–8.5)
eGFR: 84 mL/min/{1.73_m2} (ref >59)

## 2023-09-07 LAB — LIPID PANEL
Chol/HDL Ratio: 5.3 {ratio} — ABNORMAL HIGH (ref 0.0–5.0)
Cholesterol, Total: 213 mg/dL — ABNORMAL HIGH (ref 100–199)
HDL: 40 mg/dL (ref >39)
LDL Chol Calc (NIH): 123 mg/dL — ABNORMAL HIGH (ref 0–99)
Triglycerides: 281 mg/dL — ABNORMAL HIGH (ref 0–149)
VLDL Cholesterol Cal: 50 mg/dL — ABNORMAL HIGH (ref 5–40)

## 2023-09-10 ENCOUNTER — Telehealth: Payer: Self-pay | Admitting: Family Medicine

## 2023-09-10 NOTE — Telephone Encounter (Signed)
 Copied from CRM [id number]. Topic: General - Other >> Sep 10, 2023 11:56 AM Louie Casa B wrote: Reason for CRM: shared lab results with patient they had no questions or concerns

## 2023-09-12 ENCOUNTER — Encounter: Payer: Self-pay | Admitting: Family Medicine

## 2023-09-21 ENCOUNTER — Ambulatory Visit: Payer: Medicare HMO | Admitting: Family

## 2023-09-21 ENCOUNTER — Encounter: Payer: Self-pay | Admitting: Family

## 2023-09-21 ENCOUNTER — Other Ambulatory Visit: Payer: Self-pay | Admitting: Family

## 2023-09-21 VITALS — BP 133/72 | HR 81 | Temp 97.6°F | Ht 70.0 in | Wt 192.2 lb

## 2023-09-21 DIAGNOSIS — I4891 Unspecified atrial fibrillation: Secondary | ICD-10-CM

## 2023-09-21 DIAGNOSIS — I2510 Atherosclerotic heart disease of native coronary artery without angina pectoris: Secondary | ICD-10-CM

## 2023-09-21 DIAGNOSIS — Z7901 Long term (current) use of anticoagulants: Secondary | ICD-10-CM

## 2023-09-21 DIAGNOSIS — I5023 Acute on chronic systolic (congestive) heart failure: Secondary | ICD-10-CM

## 2023-09-21 DIAGNOSIS — E785 Hyperlipidemia, unspecified: Secondary | ICD-10-CM

## 2023-09-21 DIAGNOSIS — F411 Generalized anxiety disorder: Secondary | ICD-10-CM | POA: Diagnosis not present

## 2023-09-21 DIAGNOSIS — J42 Unspecified chronic bronchitis: Secondary | ICD-10-CM | POA: Diagnosis not present

## 2023-09-21 LAB — COAGUCHEK XS/INR WAIVED
INR: 2.3 — ABNORMAL HIGH (ref 0.9–1.1)
Prothrombin Time: 27.1 s

## 2023-09-21 NOTE — Progress Notes (Signed)
 Subjective:    Patient ID: Jason Spencer, male    DOB: July 12, 1950, 73 y.o.   MRN: [id number]  Chief Complaint  Patient presents with   Anticoagulation   Pt presents to the office today for INR check and chronic follow up. He is currently taking warfarin for A Fib. See anticoagulation flow sheet. Denies any bleeding, extra, or changes in diet.    His INR today is 2.3.  He is currently taking 7.5 mg Tues, Thurs, and Saturday and 5 mg all other days.  See anticoagulation flowsheet.    He does have a hx of alcohol abuse and states he has not drank since May 23, 2023. He is followed by Cardiologists for A Fib, CAD, and Cardiomyopathy.    He has COPD and smokes a 1/2 pack and uses a wood stove. He has intermittent SOB and wheezing. He has not used his Telegy recently.    He has atherosclerosis of aorta and takes Crestor 5 mg daily.    He has purpura senilis and takes warfarin. Stable Hypertension This is a chronic problem. The current episode started more than 1 year ago. The problem has been resolved since onset. The problem is controlled. Associated symptoms include anxiety. Pertinent negatives include no malaise/fatigue, peripheral edema or shortness of breath. Risk factors for coronary artery disease include dyslipidemia, male gender, sedentary lifestyle and smoking/tobacco exposure. The current treatment provides moderate improvement.  Congestive Heart Failure Presents for follow-up visit. Associated symptoms include edema. Pertinent negatives include no fatigue or shortness of breath. The symptoms have been stable.  Hyperlipidemia This is a chronic problem. The current episode started more than 1 year ago. The problem is uncontrolled. Recent lipid tests were reviewed and are high. Exacerbating diseases include obesity. Pertinent negatives include no shortness of breath. Current antihyperlipidemic treatment includes diet change. The current treatment provides mild improvement of  lipids. Risk factors for coronary artery disease include dyslipidemia, hypertension, male sex and a sedentary lifestyle.  Anxiety Presents for follow-up visit. Symptoms include excessive worry and nervous/anxious behavior. Patient reports no shortness of breath. Symptoms occur occasionally.        Review of Systems  Constitutional:  Negative for fatigue and malaise/fatigue.  Respiratory:  Negative for shortness of breath.   Psychiatric/Behavioral:  The patient is nervous/anxious.   All other systems reviewed and are negative.  Family History  Problem Relation Age of Onset   Heart disease Mother        VALUE REPLACEMENT   COPD Father    Aneurysm Father    Social History   Socioeconomic History   Marital status: Single    Spouse name: Not on file   Number of children: Not on file   Years of education: Not on file   Highest education level: Not on file  Occupational History   Not on file  Tobacco Use   Smoking status: Every Day    Current packs/day: 0.50    Average packs/day: 0.5 packs/day for 44.0 years (22.0 ttl pk-yrs)    Types: Cigarettes   Smokeless tobacco: Never  Vaping Use   Vaping status: Never Used  Substance and Sexual Activity   Alcohol use: Yes    Alcohol/week: 0.0 standard drinks of alcohol    Comment: 08/13/2014 "I haven't drank in a month or so; if I drink it would be beer on a warm day"   Drug use: No   Sexual activity: Not Currently  Other Topics Concern   Not on file  Social History Narrative   Not on file   Social Drivers of Health   Financial Resource Strain: Low Risk  (01/08/2023)   Overall Financial Resource Strain (CARDIA)    Difficulty of Paying Living Expenses: Not hard at all  Food Insecurity: No Food Insecurity (01/08/2023)   Hunger Vital Sign    Worried About Running Out of Food in the Last Year: Never true    Ran Out of Food in the Last Year: Never true  Transportation Needs: No Transportation Needs (01/08/2023)   PRAPARE -  Administrator, Civil Service (Medical): No    Lack of Transportation (Non-Medical): No  Physical Activity: Insufficiently Active (01/08/2023)   Exercise Vital Sign    Days of Exercise per Week: 2 days    Minutes of Exercise per Session: 20 min  Stress: Not on file  Social Connections: Unknown (01/08/2023)   Social Connection and Isolation Panel [NHANES]    Frequency of Communication with Friends and Family: Three times a week    Frequency of Social Gatherings with Friends and Family: Three times a week    Attends Religious Services: 1 to 4 times per year    Active Member of Clubs or Organizations: No    Attends Banker Meetings: 1 to 4 times per year    Marital Status: Not on file       Objective:   Physical Exam Vitals reviewed.  Constitutional:      General: He is not in acute distress.    Appearance: He is well-developed.  HENT:     Head: Normocephalic.     Right Ear: Tympanic membrane normal.     Left Ear: Tympanic membrane normal.  Eyes:     General:        Right eye: No discharge.        Left eye: No discharge.     Pupils: Pupils are equal, round, and reactive to light.  Neck:     Thyroid: No thyromegaly.  Cardiovascular:     Rate and Rhythm: Normal rate and regular rhythm.     Heart sounds: Normal heart sounds. No murmur heard. Pulmonary:     Effort: Pulmonary effort is normal. No respiratory distress.     Breath sounds: Wheezing present. No rhonchi.  Abdominal:     General: Bowel sounds are normal. There is no distension.     Palpations: Abdomen is soft.     Tenderness: There is no abdominal tenderness.  Musculoskeletal:        General: No tenderness. Normal range of motion.     Cervical back: Normal range of motion and neck supple.  Skin:    General: Skin is warm and dry.     Findings: No erythema or rash.  Neurological:     Mental Status: He is alert and oriented to person, place, and time.     Cranial Nerves: No cranial nerve  deficit.     Deep Tendon Reflexes: Reflexes are normal and symmetric.  Psychiatric:        Behavior: Behavior normal.        Thought Content: Thought content normal.        Judgment: Judgment normal.      BP 133/72   Pulse 81   Temp 97.6 F (36.4 C) (Temporal)   Ht 5\' 10"  (1.778 m)   Wt 192 lb 3.2 oz (87.2 kg)   SpO2 96%   BMI 27.58 kg/m  Assessment & Plan:  Samier Jaco comes in today with chief complaint of Anticoagulation   Diagnosis and orders addressed:  1. Atrial fibrillation with RVR (HCC) (Primary) - CoaguChek XS/INR Waived  2. Chronic bronchitis, unspecified chronic bronchitis type (HCC)  3. Acute on chronic systolic CHF (congestive heart failure) (HCC)  4. GAD (generalized anxiety disorder)  5. Current use of anticoagulant therapy Description   INR today is 2.3 (goal 2-3) at goal  Continue 7.5 mg Tuesday, Thursday, and Saturday, and 5 mg all other days.   Recheck in 4 weeks       6. Hyperlipidemia, unspecified hyperlipidemia type    Continue current medications  Health Maintenance reviewed Diet and exercise encouraged  Follow up plan: 4 weeks    Jannifer Rodney, FNP

## 2023-09-21 NOTE — Patient Instructions (Signed)
 Description   INR today is 2.3 (goal 2-3) at goal  Continue 7.5 mg Tuesday, Thursday, and Saturday, and 5 mg all other days.   Recheck in 4 weeks

## 2023-09-26 ENCOUNTER — Other Ambulatory Visit: Payer: Self-pay | Admitting: Family

## 2023-09-26 DIAGNOSIS — I2510 Atherosclerotic heart disease of native coronary artery without angina pectoris: Secondary | ICD-10-CM

## 2023-09-26 DIAGNOSIS — I4891 Unspecified atrial fibrillation: Secondary | ICD-10-CM

## 2023-09-26 DIAGNOSIS — I10 Essential (primary) hypertension: Secondary | ICD-10-CM

## 2023-10-19 ENCOUNTER — Encounter: Payer: Self-pay | Admitting: Family

## 2023-10-19 ENCOUNTER — Ambulatory Visit: Admitting: Family

## 2023-10-21 ENCOUNTER — Emergency Department (HOSPITAL_COMMUNITY)

## 2023-10-21 ENCOUNTER — Other Ambulatory Visit: Payer: Self-pay

## 2023-10-21 ENCOUNTER — Encounter (HOSPITAL_COMMUNITY)

## 2023-10-21 ENCOUNTER — Inpatient Hospital Stay (HOSPITAL_COMMUNITY)
Admission: EM | Admit: 2023-10-21 | Discharge: 2023-11-06 | DRG: 240 | Disposition: A | Discharge status: Skilled Nursing Facility | Attending: Internal Medicine | Admitting: Internal Medicine

## 2023-10-21 ENCOUNTER — Encounter (HOSPITAL_COMMUNITY): Payer: Self-pay | Admitting: Emergency Medicine

## 2023-10-21 DIAGNOSIS — I70245 Atherosclerosis of native arteries of left leg with ulceration of other part of foot: Secondary | ICD-10-CM | POA: Diagnosis not present

## 2023-10-21 DIAGNOSIS — D62 Acute posthemorrhagic anemia: Secondary | ICD-10-CM

## 2023-10-21 DIAGNOSIS — Z7401 Bed confinement status: Secondary | ICD-10-CM | POA: Diagnosis not present

## 2023-10-21 DIAGNOSIS — K2091 Esophagitis, unspecified with bleeding: Secondary | ICD-10-CM | POA: Diagnosis not present

## 2023-10-21 DIAGNOSIS — K209 Esophagitis, unspecified without bleeding: Secondary | ICD-10-CM | POA: Diagnosis not present

## 2023-10-21 DIAGNOSIS — I4892 Unspecified atrial flutter: Secondary | ICD-10-CM | POA: Diagnosis not present

## 2023-10-21 DIAGNOSIS — Z955 Presence of coronary angioplasty implant and graft: Secondary | ICD-10-CM | POA: Diagnosis not present

## 2023-10-21 DIAGNOSIS — I96 Gangrene, not elsewhere classified: Secondary | ICD-10-CM | POA: Insufficient documentation

## 2023-10-21 DIAGNOSIS — R195 Other fecal abnormalities: Secondary | ICD-10-CM

## 2023-10-21 DIAGNOSIS — I5022 Chronic systolic (congestive) heart failure: Secondary | ICD-10-CM | POA: Diagnosis present

## 2023-10-21 DIAGNOSIS — Z91013 Allergy to seafood: Secondary | ICD-10-CM

## 2023-10-21 DIAGNOSIS — F411 Generalized anxiety disorder: Secondary | ICD-10-CM | POA: Diagnosis not present

## 2023-10-21 DIAGNOSIS — M869 Osteomyelitis, unspecified: Secondary | ICD-10-CM | POA: Diagnosis not present

## 2023-10-21 DIAGNOSIS — B3781 Candidal esophagitis: Secondary | ICD-10-CM | POA: Diagnosis present

## 2023-10-21 DIAGNOSIS — M86172 Other acute osteomyelitis, left ankle and foot: Secondary | ICD-10-CM | POA: Diagnosis not present

## 2023-10-21 DIAGNOSIS — E1169 Type 2 diabetes mellitus with other specified complication: Secondary | ICD-10-CM | POA: Diagnosis present

## 2023-10-21 DIAGNOSIS — I2510 Atherosclerotic heart disease of native coronary artery without angina pectoris: Secondary | ICD-10-CM | POA: Diagnosis present

## 2023-10-21 DIAGNOSIS — Z751 Person awaiting admission to adequate facility elsewhere: Secondary | ICD-10-CM

## 2023-10-21 DIAGNOSIS — F1011 Alcohol abuse, in remission: Secondary | ICD-10-CM | POA: Diagnosis present

## 2023-10-21 DIAGNOSIS — Z4781 Encounter for orthopedic aftercare following surgical amputation: Secondary | ICD-10-CM | POA: Diagnosis not present

## 2023-10-21 DIAGNOSIS — F17210 Nicotine dependence, cigarettes, uncomplicated: Secondary | ICD-10-CM | POA: Diagnosis present

## 2023-10-21 DIAGNOSIS — I5023 Acute on chronic systolic (congestive) heart failure: Secondary | ICD-10-CM | POA: Diagnosis not present

## 2023-10-21 DIAGNOSIS — N179 Acute kidney failure, unspecified: Secondary | ICD-10-CM | POA: Diagnosis not present

## 2023-10-21 DIAGNOSIS — I700 Atherosclerosis of aorta: Secondary | ICD-10-CM | POA: Diagnosis present

## 2023-10-21 DIAGNOSIS — Z87898 Personal history of other specified conditions: Secondary | ICD-10-CM | POA: Diagnosis not present

## 2023-10-21 DIAGNOSIS — M85872 Other specified disorders of bone density and structure, left ankle and foot: Secondary | ICD-10-CM | POA: Diagnosis not present

## 2023-10-21 DIAGNOSIS — K229 Disease of esophagus, unspecified: Secondary | ICD-10-CM | POA: Diagnosis not present

## 2023-10-21 DIAGNOSIS — I739 Peripheral vascular disease, unspecified: Secondary | ICD-10-CM | POA: Diagnosis not present

## 2023-10-21 DIAGNOSIS — F1010 Alcohol abuse, uncomplicated: Secondary | ICD-10-CM | POA: Diagnosis present

## 2023-10-21 DIAGNOSIS — J42 Unspecified chronic bronchitis: Secondary | ICD-10-CM

## 2023-10-21 DIAGNOSIS — F17200 Nicotine dependence, unspecified, uncomplicated: Secondary | ICD-10-CM | POA: Diagnosis not present

## 2023-10-21 DIAGNOSIS — Z79899 Other long term (current) drug therapy: Secondary | ICD-10-CM

## 2023-10-21 DIAGNOSIS — E876 Hypokalemia: Secondary | ICD-10-CM | POA: Diagnosis not present

## 2023-10-21 DIAGNOSIS — D638 Anemia in other chronic diseases classified elsewhere: Secondary | ICD-10-CM | POA: Diagnosis present

## 2023-10-21 DIAGNOSIS — I509 Heart failure, unspecified: Secondary | ICD-10-CM | POA: Diagnosis not present

## 2023-10-21 DIAGNOSIS — Z825 Family history of asthma and other chronic lower respiratory diseases: Secondary | ICD-10-CM

## 2023-10-21 DIAGNOSIS — I4891 Unspecified atrial fibrillation: Secondary | ICD-10-CM | POA: Diagnosis not present

## 2023-10-21 DIAGNOSIS — Z716 Tobacco abuse counseling: Secondary | ICD-10-CM

## 2023-10-21 DIAGNOSIS — G8918 Other acute postprocedural pain: Secondary | ICD-10-CM | POA: Diagnosis not present

## 2023-10-21 DIAGNOSIS — I7090 Unspecified atherosclerosis: Secondary | ICD-10-CM | POA: Diagnosis not present

## 2023-10-21 DIAGNOSIS — K2100 Gastro-esophageal reflux disease with esophagitis, without bleeding: Secondary | ICD-10-CM | POA: Diagnosis present

## 2023-10-21 DIAGNOSIS — K3189 Other diseases of stomach and duodenum: Secondary | ICD-10-CM | POA: Diagnosis not present

## 2023-10-21 DIAGNOSIS — I70262 Atherosclerosis of native arteries of extremities with gangrene, left leg: Principal | ICD-10-CM | POA: Diagnosis present

## 2023-10-21 DIAGNOSIS — I10 Essential (primary) hypertension: Secondary | ICD-10-CM | POA: Diagnosis not present

## 2023-10-21 DIAGNOSIS — W19XXXA Unspecified fall, initial encounter: Secondary | ICD-10-CM | POA: Diagnosis not present

## 2023-10-21 DIAGNOSIS — R935 Abnormal findings on diagnostic imaging of other abdominal regions, including retroperitoneum: Secondary | ICD-10-CM | POA: Diagnosis not present

## 2023-10-21 DIAGNOSIS — J449 Chronic obstructive pulmonary disease, unspecified: Secondary | ICD-10-CM | POA: Diagnosis present

## 2023-10-21 DIAGNOSIS — I110 Hypertensive heart disease with heart failure: Secondary | ICD-10-CM | POA: Diagnosis present

## 2023-10-21 DIAGNOSIS — R600 Localized edema: Secondary | ICD-10-CM | POA: Diagnosis not present

## 2023-10-21 DIAGNOSIS — K2970 Gastritis, unspecified, without bleeding: Secondary | ICD-10-CM

## 2023-10-21 DIAGNOSIS — I482 Chronic atrial fibrillation, unspecified: Secondary | ICD-10-CM | POA: Diagnosis present

## 2023-10-21 DIAGNOSIS — E785 Hyperlipidemia, unspecified: Secondary | ICD-10-CM | POA: Diagnosis present

## 2023-10-21 DIAGNOSIS — R531 Weakness: Secondary | ICD-10-CM | POA: Diagnosis not present

## 2023-10-21 DIAGNOSIS — I998 Other disorder of circulatory system: Secondary | ICD-10-CM | POA: Insufficient documentation

## 2023-10-21 DIAGNOSIS — R918 Other nonspecific abnormal finding of lung field: Secondary | ICD-10-CM | POA: Diagnosis not present

## 2023-10-21 DIAGNOSIS — D689 Coagulation defect, unspecified: Secondary | ICD-10-CM | POA: Diagnosis not present

## 2023-10-21 DIAGNOSIS — Z7982 Long term (current) use of aspirin: Secondary | ICD-10-CM

## 2023-10-21 DIAGNOSIS — J189 Pneumonia, unspecified organism: Secondary | ICD-10-CM | POA: Diagnosis not present

## 2023-10-21 DIAGNOSIS — E538 Deficiency of other specified B group vitamins: Secondary | ICD-10-CM | POA: Diagnosis present

## 2023-10-21 DIAGNOSIS — Z7901 Long term (current) use of anticoagulants: Secondary | ICD-10-CM

## 2023-10-21 DIAGNOSIS — S301XXA Contusion of abdominal wall, initial encounter: Secondary | ICD-10-CM | POA: Diagnosis not present

## 2023-10-21 DIAGNOSIS — M7732 Calcaneal spur, left foot: Secondary | ICD-10-CM | POA: Diagnosis not present

## 2023-10-21 DIAGNOSIS — Z7902 Long term (current) use of antithrombotics/antiplatelets: Secondary | ICD-10-CM | POA: Diagnosis not present

## 2023-10-21 DIAGNOSIS — E1152 Type 2 diabetes mellitus with diabetic peripheral angiopathy with gangrene: Secondary | ICD-10-CM | POA: Diagnosis not present

## 2023-10-21 DIAGNOSIS — L409 Psoriasis, unspecified: Secondary | ICD-10-CM | POA: Diagnosis present

## 2023-10-21 DIAGNOSIS — R0602 Shortness of breath: Secondary | ICD-10-CM | POA: Diagnosis not present

## 2023-10-21 DIAGNOSIS — M2012 Hallux valgus (acquired), left foot: Secondary | ICD-10-CM | POA: Diagnosis not present

## 2023-10-21 DIAGNOSIS — K5730 Diverticulosis of large intestine without perforation or abscess without bleeding: Secondary | ICD-10-CM | POA: Diagnosis not present

## 2023-10-21 DIAGNOSIS — Z8249 Family history of ischemic heart disease and other diseases of the circulatory system: Secondary | ICD-10-CM

## 2023-10-21 DIAGNOSIS — L89152 Pressure ulcer of sacral region, stage 2: Secondary | ICD-10-CM | POA: Diagnosis not present

## 2023-10-21 DIAGNOSIS — K2990 Gastroduodenitis, unspecified, without bleeding: Secondary | ICD-10-CM | POA: Diagnosis present

## 2023-10-21 LAB — CBC WITH DIFFERENTIAL/PLATELET
Abs Immature Granulocytes: 0.23 10*3/uL — ABNORMAL HIGH (ref 0.00–0.07)
Basophils Absolute: 0.1 10*3/uL (ref 0.0–0.1)
Basophils Relative: 0 %
Eosinophils Absolute: 0.1 10*3/uL (ref 0.0–0.5)
Eosinophils Relative: 1 %
HCT: 38.7 % — ABNORMAL LOW (ref 39.0–52.0)
Hemoglobin: 12.2 g/dL — ABNORMAL LOW (ref 13.0–17.0)
Immature Granulocytes: 2 %
Lymphocytes Relative: 8 %
Lymphs Abs: 1.3 10*3/uL (ref 0.7–4.0)
MCH: 31 pg (ref 26.0–34.0)
MCHC: 31.5 g/dL (ref 30.0–36.0)
MCV: 98.2 fL (ref 80.0–100.0)
Monocytes Absolute: 1.5 10*3/uL — ABNORMAL HIGH (ref 0.1–1.0)
Monocytes Relative: 10 %
Neutro Abs: 12.5 10*3/uL — ABNORMAL HIGH (ref 1.7–7.7)
Neutrophils Relative %: 79 %
Platelets: 432 10*3/uL — ABNORMAL HIGH (ref 150–400)
RBC: 3.94 MIL/uL — ABNORMAL LOW (ref 4.22–5.81)
RDW: 12 % (ref 11.5–15.5)
WBC: 15.7 10*3/uL — ABNORMAL HIGH (ref 4.0–10.5)
nRBC: 0 % (ref 0.0–0.2)

## 2023-10-21 LAB — COMPREHENSIVE METABOLIC PANEL WITH GFR
ALT: 10 U/L (ref 0–44)
AST: 19 U/L (ref 15–41)
Albumin: 2.4 g/dL — ABNORMAL LOW (ref 3.5–5.0)
Alkaline Phosphatase: 51 U/L (ref 38–126)
Anion gap: 12 (ref 5–15)
BUN: 9 mg/dL (ref 8–23)
CO2: 23 mmol/L (ref 22–32)
Calcium: 8.4 mg/dL — ABNORMAL LOW (ref 8.9–10.3)
Chloride: 100 mmol/L (ref 98–111)
Creatinine, Ser: 1.04 mg/dL (ref 0.61–1.24)
GFR, Estimated: >60 mL/min (ref >60)
Glucose, Bld: 114 mg/dL — ABNORMAL HIGH (ref 70–99)
Potassium: 4.2 mmol/L (ref 3.5–5.1)
Sodium: 135 mmol/L (ref 135–145)
Total Bilirubin: 0.4 mg/dL (ref 0.0–1.2)
Total Protein: 8.2 g/dL — ABNORMAL HIGH (ref 6.5–8.1)

## 2023-10-21 LAB — PROTIME-INR
INR: 3.5 — ABNORMAL HIGH (ref 0.8–1.2)
Prothrombin Time: 35.2 s — ABNORMAL HIGH (ref 11.4–15.2)

## 2023-10-21 LAB — I-STAT CG4 LACTIC ACID, ED: Lactic Acid, Venous: 1.6 mmol/L (ref 0.5–1.9)

## 2023-10-21 LAB — MRSA NEXT GEN BY PCR, NASAL: MRSA by PCR Next Gen: NOT DETECTED

## 2023-10-21 LAB — C-REACTIVE PROTEIN: CRP: 9 mg/dL — ABNORMAL HIGH (ref <1.0)

## 2023-10-21 MED ORDER — ACETAMINOPHEN 325 MG PO TABS: 650.0000 mg | ORAL_TABLET | Freq: Four times a day (QID) | ORAL | Status: DC | PRN

## 2023-10-21 MED ORDER — VANCOMYCIN HCL 1750 MG/350ML IV SOLN
1750.0000 mg | Freq: Once | INTRAVENOUS | Status: DC
  Filled 2023-10-21: qty 350

## 2023-10-21 MED ORDER — METOPROLOL SUCCINATE ER 50 MG PO TB24
100.0000 mg | ORAL_TABLET | Freq: Every day | ORAL | Status: DC
  Administered 2023-10-22 – 2023-11-06 (×16): 100 mg via ORAL
  Filled 2023-10-21: qty 1
  Filled 2023-10-21: qty 2
  Filled 2023-10-21: qty 1
  Filled 2023-10-21: qty 2
  Filled 2023-10-21 (×2): qty 1
  Filled 2023-10-21: qty 2
  Filled 2023-10-21 (×3): qty 1
  Filled 2023-10-21: qty 2
  Filled 2023-10-21 (×3): qty 1
  Filled 2023-10-21: qty 2
  Filled 2023-10-21: qty 1

## 2023-10-21 MED ORDER — AMLODIPINE BESYLATE 5 MG PO TABS
5.0000 mg | ORAL_TABLET | Freq: Every day | ORAL | Status: DC
  Administered 2023-10-22: 5 mg via ORAL
  Filled 2023-10-21: qty 1

## 2023-10-21 MED ORDER — HYDROCODONE-ACETAMINOPHEN 5-325 MG PO TABS
1.0000 | ORAL_TABLET | Freq: Once | ORAL | Status: AC
  Administered 2023-10-21: 1 via ORAL
  Filled 2023-10-21: qty 1

## 2023-10-21 MED ORDER — PIPERACILLIN-TAZOBACTAM 3.375 G IVPB 30 MIN
3.3750 g | Freq: Once | INTRAVENOUS | Status: AC
  Administered 2023-10-21: 3.375 g via INTRAVENOUS
  Filled 2023-10-21: qty 50

## 2023-10-21 MED ORDER — SODIUM CHLORIDE 0.9% FLUSH
3.0000 mL | Freq: Two times a day (BID) | INTRAVENOUS | Status: DC
  Administered 2023-10-21 – 2023-11-06 (×24): 3 mL via INTRAVENOUS

## 2023-10-21 MED ORDER — ACETAMINOPHEN 650 MG RE SUPP: 650.0000 mg | Freq: Four times a day (QID) | RECTAL | Status: DC | PRN

## 2023-10-21 MED ORDER — LINEZOLID 600 MG/300ML IV SOLN
600.0000 mg | Freq: Two times a day (BID) | INTRAVENOUS | Status: DC
  Administered 2023-10-21: 600 mg via INTRAVENOUS
  Filled 2023-10-21 (×2): qty 300

## 2023-10-21 MED ORDER — BUDESON-GLYCOPYRROL-FORMOTEROL 160-9-4.8 MCG/ACT IN AERO
2.0000 | INHALATION_SPRAY | Freq: Two times a day (BID) | RESPIRATORY_TRACT | Status: DC
  Administered 2023-10-21 – 2023-11-06 (×29): 2 via RESPIRATORY_TRACT
  Filled 2023-10-21 (×2): qty 5.9

## 2023-10-21 MED ORDER — PIPERACILLIN-TAZOBACTAM 3.375 G IVPB
3.3750 g | Freq: Three times a day (TID) | INTRAVENOUS | Status: AC
  Administered 2023-10-21 – 2023-11-01 (×33): 3.375 g via INTRAVENOUS
  Filled 2023-10-21 (×34): qty 50

## 2023-10-21 MED ORDER — IOHEXOL 350 MG/ML SOLN
115.0000 mL | Freq: Once | INTRAVENOUS | Status: AC | PRN
  Administered 2023-10-21: 115 mL via INTRAVENOUS

## 2023-10-21 MED ORDER — ROSUVASTATIN CALCIUM 5 MG PO TABS
5.0000 mg | ORAL_TABLET | Freq: Every day | ORAL | Status: DC
  Administered 2023-10-22 – 2023-10-27 (×6): 5 mg via ORAL
  Filled 2023-10-21 (×6): qty 1

## 2023-10-21 MED ORDER — HYDROCODONE-ACETAMINOPHEN 5-325 MG PO TABS
1.0000 | ORAL_TABLET | Freq: Four times a day (QID) | ORAL | Status: DC | PRN
  Administered 2023-10-22 – 2023-10-27 (×12): 1 via ORAL
  Filled 2023-10-21 (×12): qty 1

## 2023-10-21 MED ORDER — VANCOMYCIN HCL IN DEXTROSE 1-5 GM/200ML-% IV SOLN: 1000.0000 mg | Freq: Two times a day (BID) | INTRAVENOUS | Status: DC

## 2023-10-21 MED ORDER — CLINDAMYCIN PHOSPHATE 600 MG/50ML IV SOLN
600.0000 mg | Freq: Once | INTRAVENOUS | Status: DC
  Filled 2023-10-21: qty 50

## 2023-10-21 MED ORDER — POLYETHYLENE GLYCOL 3350 17 G PO PACK
17.0000 g | PACK | Freq: Every day | ORAL | Status: DC | PRN
  Administered 2023-11-01: 17 g via ORAL
  Filled 2023-10-21: qty 1

## 2023-10-21 NOTE — ED Provider Notes (Signed)
 [place] EMERGENCY DEPARTMENT AT [facility] Provider Note   CSN: [id number] Arrival date & time: 10/21/23  1311     History  Chief Complaint  Patient presents with   Circulatory Problem    Jason Spencer is a 73 y.o. male.  Patient with past medical history of congestive heart failure history of A-fib on Warfarin, coronary artery disease, COPD, alcohol abuse, arthrosclerosis of aorta and presumed peripheral artery disease presenting to emergency room with complaint of toe pain.  Patient reports that his 5th and 4th digit on his left foot gradually started becoming black proximately 2 weeks ago and over the last week have progressively gotten worse.  It is associated with foul odor and also surrounding erythema.  Patient denies any fevers or chills.  Patient reports that he has had a difficult time ambulating over the past days.  Patient lives with his sister who has been in the hospital for 2 weeks and when a family member checked on him yesterday they found he was having a hard time walking around and encouraged him to come to the hospital.  Denies fevers, chills, abdominal pain, nausea vomiting diarrhea chest pain shortness of breath.  HPI     Home Medications Prior to Admission medications   Medication Sig Start Date End Date Taking? Authorizing Provider  amLODipine (NORVASC) 5 MG tablet TAKE ONE TABLET ONCE DAILY 09/26/23   Tommas Fragmin A, FNP  Budeson-Glycopyrrol-Formoterol (BREZTRI AEROSPHERE) 160-9-4.8 MCG/ACT AERO Inhale 2 puffs into the lungs 2 (two) times daily. 09/15/22   Tommas Fragmin A, FNP  lidocaine 4 % Place 2 patches onto the skin daily. 07/06/23   Chrystine Crate, FNP  lisinopril (ZESTRIL) 40 MG tablet TAKE ONE TABLET ONCE DAILY 09/26/23   Tommas Fragmin A, FNP  metoprolol succinate (TOPROL-XL) 100 MG 24 hr tablet TAKE 1 TABLET WITH OR IMMEDIATELY FOLLOWING A MEAL 09/26/23   Hawks, Christy A, FNP  rosuvastatin (CRESTOR) 5 MG tablet Take 1 tablet (5  mg total) by mouth daily. 09/15/22   Yevette Hem, FNP  triamcinolone ointment (KENALOG) 0.5 % Apply 1 Application topically 2 (two) times daily. 05/10/23   Tommas Fragmin A, FNP  warfarin (COUMADIN) 5 MG tablet TAKE ONE TABLET ON MONDAY, WEDNESDAY, AND FRIDAY. TAKE 1 AND 1/2 TABLETS ON ALL other DAYS 09/21/23   Yevette Hem, FNP      Allergies    Shellfish allergy    Review of Systems   Review of Systems  Physical Exam Updated Vital Signs BP 122/68   Pulse 97   Temp 97.7 F (36.5 C)   Resp 16   Ht 5\' 10"  (1.778 m)   Wt 86.2 kg   SpO2 99%   BMI 27.26 kg/m  Physical Exam Vitals and nursing note reviewed.  Constitutional:      General: He is not in acute distress.    Appearance: He is not toxic-appearing.  HENT:     Head: Normocephalic and atraumatic.  Eyes:     General: No scleral icterus.    Conjunctiva/sclera: Conjunctivae normal.  Cardiovascular:     Rate and Rhythm: Normal rate and regular rhythm.     Pulses: Normal pulses.     Heart sounds: Normal heart sounds.  Pulmonary:     Effort: Pulmonary effort is normal. No respiratory distress.     Breath sounds: Normal breath sounds.  Abdominal:     General: Abdomen is flat. Bowel sounds are normal.     Palpations: Abdomen is  soft.     Tenderness: There is no abdominal tenderness.  Musculoskeletal:     Right lower leg: No edema.     Left lower leg: No edema.  Skin:    General: Skin is warm and dry.     Findings: No lesion.  Neurological:     General: No focal deficit present.     Mental Status: He is alert and oriented to person, place, and time. Mental status is at baseline.        ED Results / Procedures / Treatments   Labs (all labs ordered are listed, but only abnormal results are displayed) Labs Reviewed  COMPREHENSIVE METABOLIC PANEL WITH GFR - Abnormal; Notable for the following components:      Result Value   Glucose, Bld 114 (*)    Calcium 8.4 (*)    Total Protein 8.2 (*)    Albumin 2.4  (*)    All other components within normal limits  CBC WITH DIFFERENTIAL/PLATELET - Abnormal; Notable for the following components:   WBC 15.7 (*)    RBC 3.94 (*)    Hemoglobin 12.2 (*)    HCT 38.7 (*)    Platelets 432 (*)    Neutro Abs 12.5 (*)    Monocytes Absolute 1.5 (*)    Abs Immature Granulocytes 0.23 (*)    All other components within normal limits  PROTIME-INR - Abnormal; Notable for the following components:   Prothrombin Time 35.2 (*)    INR 3.5 (*)    All other components within normal limits  CULTURE, BLOOD (ROUTINE X 2)  CULTURE, BLOOD (ROUTINE X 2)  I-STAT CG4 LACTIC ACID, ED    EKG None  Radiology DG Foot Complete Left Result Date: 10/21/2023 CLINICAL DATA:  Black fifth toe EXAM: LEFT FOOT - COMPLETE 3+ VIEW COMPARISON:  None Available. FINDINGS: Hallux valgus deformity. Subcutaneous gas about the head of the fifth metatarsal and proximal phalanx left fifth toe. Cortical loss medially from the fifth metatarsal head. No fracture or dislocation. Mild diffuse osteopenia. Calcaneal spur. Extensive arterial calcifications. IMPRESSION: Subcutaneous gas about the head of the fifth metatarsal and proximal phalanx left fifth toe, with cortical loss medially from the fifth metatarsal head, consistent with osteomyelitis. Electronically Signed   By: Nicoletta Barrier M.D.   On: 10/21/2023 14:11    Procedures Procedures    Medications Ordered in ED Medications - No data to display  ED Course/ Medical Decision Making/ A&P Clinical Course as of 10/21/23 1508  Sun Oct 21, 2023  1408 Dr Rosalva Comber with vascular consulted.  Patient agrees to see this patient in consult, will see tomorrow and recommend admitting to medicine. [JB]  1502 S- necrotic 4th and 5th toes xfew days, periph art disease and diabetes, subcutaneous gas on xray,vanc zosyn ordered [ ]  pend scans and admit to medicine [AO]    Clinical Course User Index [AO] Lorain Robson, MD [JB] Khayri Kargbo, Kandace Organ, PA-C                                  Medical Decision Making Amount and/or Complexity of Data Reviewed Labs: ordered. Radiology: ordered.   This patient presents to the ED for concern of black toe, this involves an extensive number of treatment options, and is a complaint that carries with it a high risk of complications and morbidity.  The differential diagnosis includes peripheral artery disease, acute embolism, osteomyelitis, necrosis, ulceration  Co morbidities that complicate the patient evaluation  PAD Afib on warfarin CHF EtOH use     Lab Tests:  I personally interpreted labs.  The pertinent results include:   CBC with mild leukocytosis at 15.2 hemoglobin is 12.2 CMP without significant electrolyte abnormality.  Normal liver function and normal kidney function INR is 3.5 no active bleeding. Blood cultures pending Lactic is 1.6   Imaging Studies ordered:  I ordered imaging studies including foot x-ray  I independently visualized and interpreted imaging which showed gas of 5th digit and osteomyelitis, discussed with vascular surgery, recommending ABI. Discussed with attending Dr Synetta Eves recommending CT of lower extremity.  I agree with the radiologist interpretation   Cardiac Monitoring: / EKG:  The patient was maintained on a cardiac monitor.     Consultations Obtained:  I requested consultation with the vascular,  and discussed lab and imaging findings as well as pertinent plan - they recommend: ABI and agree to see in consult.  Will need consult to ortho surgery and will need admission to medicine.    Problem List / ED Course / Critical interventions / Medication management  Patient reporting to emergency room with necrosis of fourth digit.  This is an ongoing for several weeks.  He does have history of peripheral artery disease.  He is not diabetic.  He thinks this started off as a small ingrown toenail and has been has been gotten worse.  He reports difficulty with ambulating. Is  able to move extremities without difficulty. Has slow cap refill and difficult to palpate dorsal pedal pulses bilaterally. He dose not appear systemically ill, he is without fever and tachycardia. He has mild leukocytosis.  X-ray shows concern for gas formation and osteomyelitis over 5th digit.  Will obtain CT scan to further characterize.  Will obtain ABI.  Vascular surgery will consult.  Patient's pain is under control at this time. I ordered medication including Vanc and Zosyn for infection coverage.  I have reviewed the patients home medicines and have made adjustments as needed   Plan  Patient needs admission. Signed off to oncoming provider.         Final Clinical Impression(s) / ED Diagnoses Final diagnoses:  None    Rx / DC Orders ED Discharge Orders     None         Eudora Heron, PA-C 10/21/23 1513    Auston Blush, MD 10/25/23 [phone]

## 2023-10-21 NOTE — ED Notes (Signed)
Patient transported to Vascular 

## 2023-10-21 NOTE — Progress Notes (Addendum)
 Pharmacy Antibiotic Note  Jason Spencer is a 73 y.o. male for which pharmacy has been consulted for vancomycin dosing for  osteomyelitis .  Patient with a history of HF, AF on warfarin, CAD, COPD, PAD. Patient presenting with toe pain, reports 5th and 4th digit on his left foot gradually started becoming black proximately 2 weeks ago.  Foot XR w/ subcutaneous gas about 5th metatarsal head and proximal phalanx of lt 5th toe and cortical loss c/f osteomyelitis  CTA Subcutaneous edema with subcutaneous gas involving in the lateral aspect of the left foot. No obvious abscess is seen. Evaluation for osteomyelitis is limited due to field of view  SCr 1.04 WBC 15.7; LA 1.6; T 98.2; HR 81; RR 16  Plan: Clindamycin initially ordered but ED attending deciding against it for now. Would be mindful of anti-toxin agent chosen as patient is on warfarin Zosyn 3.375g IV q8h (4 hour infusion) Vancomycin 1750 mg once then 1000 mg q12hr (eAUC 527.9) unless change in renal function Monitor WBC, fever, renal function, cultures De-escalate when able Levels at steady state  ADDENDUM 10/21/23 5:44 PM: Admitting is opting zosyn + linezolid Orders updated  Dionicio Fray, PharmD, BCPS 10/21/2023 5:45 PM ED Clinical Pharmacist -  [phone]   Height: 5\' 10"  (177.8 cm) Weight: 86.2 kg (190 lb) IBW/kg (Calculated) : 73  Temp (24hrs), Avg:97.7 F (36.5 C), Min:97.7 F (36.5 C), Max:97.7 F (36.5 C)  Recent Labs  Lab 10/21/23 1327 10/21/23 1341  WBC 15.7*  --   CREATININE 1.04  --   LATICACIDVEN  --  1.6    Estimated Creatinine Clearance: 68.2 mL/min (by C-G formula based on SCr of 1.04 mg/dL).    Allergies  Allergen Reactions   Shellfish Allergy Nausea And Vomiting   Microbiology results: Pending  Thank you for allowing pharmacy to be a part of this patient's care.  Dionicio Fray, PharmD, BCPS 10/21/2023 3:04 PM ED Clinical Pharmacist -  [phone]

## 2023-10-21 NOTE — Progress Notes (Addendum)
 ANTICOAGULATION CONSULT NOTE  Pharmacy Consult for Heparin Indication: atrial fibrillation and limb ischemia  Allergies  Allergen Reactions   Shellfish Allergy Nausea And Vomiting    Patient Measurements: Height: 5\' 10"  (177.8 cm) Weight: 86.2 kg (190 lb) IBW/kg (Calculated) : 73 Heparin Dosing Weight: 86.2 kg  Vital Signs: Temp: 98.2 F (36.8 C) (04/13 1630) Temp Source: Oral (04/13 1630) BP: 117/72 (04/13 1630) Pulse Rate: 81 (04/13 1630)  Labs: Recent Labs    10/21/23 1327 10/21/23 1424  HGB 12.2*  --   HCT 38.7*  --   PLT 432*  --   LABPROT  --  35.2*  INR  --  3.5*  CREATININE 1.04  --     Estimated Creatinine Clearance: 68.2 mL/min (by C-G formula based on SCr of 1.04 mg/dL).   Medical History: Past Medical History:  Diagnosis Date   Acute CHF (HCC)    Maximo Spar 08/13/2014   Atrial fibrillation with RVR (HCC) 08/13/2014   new onset/notes 08/13/2014   CAP (community acquired pneumonia) 07/2014   Cardiomyopathy (HCC)    Migraine 1970    Assessment: 72 yom with a history of HF, AF on warfarin, CAD, COPD, PAD . Patient presenting with toe pain, reports 5th and 4th digit on his left foot gradually started becoming black proximately 2 weeks ago. Heparin per pharmacy consult placed for atrial fibrillation and limb ischemia .  Patient is on warfarin prior to arrival. Last FM visit (3/14) 7.5 mg Tuesday, Thursday, and Saturday, and 5 mg all other days --- med rec not completed yet this admission  Foot XR w/ subcutaneous gas about 5th metatarsal head and proximal phalanx of lt 5th toe and cortical loss c/f osteomyelitis   CTA Subcutaneous edema with subcutaneous gas involving in the lateral aspect of the left foot. No obvious abscess is seen. Evaluation for osteomyelitis is limited due to field of view  Hgb 12.2; plt 432 INR 3.5  Goal of Therapy:  Heparin level 0.3-0.7 units/ml Monitor platelets by anticoagulation protocol: Yes   Plan:  Start heparin once INR </=  2 Monitor INR, H&H and platelets  Dionicio Fray, PharmD, BCPS 10/21/2023 5:48 PM ED Clinical Pharmacist -  [phone]

## 2023-10-21 NOTE — H&P (Addendum)
 History and Physical   Jason Spencer YQM:[id number] DOB: [birth year] DOA: 10/21/2023  PCP: Yevette Hem, FNP   Patient coming from: Home  Chief Complaint: Toe pain  HPI: Jason Spencer is a 73 y.o. male with medical history significant of hypertension, hyperlipidemia, atrial fibrillation/flutter, CAD, COPD, QT prolongation, CHF, alcohol use, anxiety, pleural effusion presenting with worsening toe pain.  Patient has had some worsening toe pain and discoloration for the past 2 weeks.  He has noticed some gradual worsening pain and discoloration to the point where the toes are now turned black on the left foot.  Changes are at his 4th and 5th digit.  Has not become difficult to walk. He lives with his sister but she has been in the hospital last couple of weeks and someone came to check on him yesterday and recommended he be seen in the ED.  Patient denies fevers, chills, chest pain, shortness of breath, abdominal pain, constipation, diarrhea, nausea, vomiting.  ED Course: Vital signs in the ED stable.  Lab workup included CMP with glucose 114, calcium 8.4, protein 8.2, albumin 2.4.  CBC with leukocytosis to 15.7, hemoglobin 12.2, platelets 132.  PT 35.2 INR 3.5.  Lactic acid normal with repeat pending.  CRP pending.  Blood cultures pending.  Imaging included left foot x-ray which showed subcu gas at the head of the left fifth metatarsal and proximal phalanx with cortical erosion consistent with osteomyelitis.  CTA runoff study showed severe multifocal high-grade stenosis of bilateral common femoral, superficial femoral, popliteal, anterior tibial, posterior tibial, peroneal arteries.  Also noted was edema of the foot.  Evaluation of osteomyelitis was limited on CT due to field-of-view.  Patient received. Norco, vancomycin, Zosyn in the ED.  Vascular surgery was consulted and said they would see the patient tomorrow to eval for revascularization, requests orthopedic consult for other surgical  intervention(s).  Review of Systems: As per HPI otherwise all other systems reviewed and are negative.  Past Medical History:  Diagnosis Date   Acute CHF (HCC)    Maximo Spar 08/13/2014   Atrial fibrillation with RVR (HCC) 08/13/2014   new onset/notes 08/13/2014   CAP (community acquired pneumonia) 07/2014   Cardiomyopathy (HCC)    Migraine 1970     Past Surgical History:  Procedure Laterality Date   CARDIOVERSION N/A 08/19/2014   Procedure: CARDIOVERSION;  Surgeon: Darlis Eisenmenger, MD;  Location: [facility] ENDOSCOPY;  Service: Cardiovascular;  Laterality: N/A;   ENDOVENOUS ABLATION SAPHENOUS VEIN W/ LASER Right 05/01/2018   endovenous laser ablation right greater saphenous vein by Stacia Dynes MD    LEFT AND RIGHT HEART CATHETERIZATION WITH CORONARY ANGIOGRAM N/A 08/17/2014   Procedure: LEFT AND RIGHT HEART CATHETERIZATION WITH CORONARY ANGIOGRAM;  Surgeon: Mickiel Albany, MD;  Location: [facility] C[facility] LAB;  Service: Cardiovascular;  Laterality: N/A;   NO PAST SURGERIES     PERCUTANEOUS CORONARY STENT INTERVENTION (PCI-S)  08/17/2014   Procedure: PERCUTANEOUS CORONARY STENT INTERVENTION (PCI-S);  Surgeon: Mickiel Albany, MD;  Location: [facility] CATH LAB;  Service: Cardiovascular;;   TEE WITHOUT CARDIOVERSION N/A 08/19/2014   Procedure: TRANSESOPHAGEAL ECHOCARDIOGRAM (TEE);  Surgeon: Darlis Eisenmenger, MD;  Location: [facility] ENDOSCOPY;  Service: Cardiovascular;  Laterality: N/A;    Social History  reports that he has been smoking cigarettes. He has a 22 pack-year smoking history. He has never used smokeless tobacco. He reports current alcohol use. He reports that he does not use drugs.  Allergies  Allergen Reactions   Shellfish Allergy Nausea And Vomiting  Family History  Problem Relation Age of Onset   Heart disease Mother        VALUE REPLACEMENT   COPD Father    Aneurysm Father   Reviewed on admission  Prior to Admission medications   Medication Sig Start Date End Date Taking? Authorizing Provider   amLODipine (NORVASC) 5 MG tablet TAKE ONE TABLET ONCE DAILY 09/26/23   Tommas Fragmin A, FNP  Budeson-Glycopyrrol-Formoterol (BREZTRI AEROSPHERE) 160-9-4.8 MCG/ACT AERO Inhale 2 puffs into the lungs 2 (two) times daily. 09/15/22   Tommas Fragmin A, FNP  lidocaine 4 % Place 2 patches onto the skin daily. 07/06/23   Chrystine Crate, FNP  lisinopril (ZESTRIL) 40 MG tablet TAKE ONE TABLET ONCE DAILY 09/26/23   Tommas Fragmin A, FNP  metoprolol succinate (TOPROL-XL) 100 MG 24 hr tablet TAKE 1 TABLET WITH OR IMMEDIATELY FOLLOWING A MEAL 09/26/23   Hawks, Christy A, FNP  rosuvastatin (CRESTOR) 5 MG tablet Take 1 tablet (5 mg total) by mouth daily. 09/15/22   Yevette Hem, FNP  triamcinolone ointment (KENALOG) 0.5 % Apply 1 Application topically 2 (two) times daily. 05/10/23   Tommas Fragmin A, FNP  warfarin (COUMADIN) 5 MG tablet TAKE ONE TABLET ON MONDAY, WEDNESDAY, AND FRIDAY. TAKE 1 AND 1/2 TABLETS ON ALL other DAYS 09/21/23   Yevette Hem, FNP    Physical Exam: Vitals:   10/21/23 1318 10/21/23 1323 10/21/23 1630  BP: 122/68  117/72  Pulse: 97  81  Resp: 16  16  Temp: 97.7 F (36.5 C)  98.2 F (36.8 C)  TempSrc:   Oral  SpO2: 99%  98%  Weight:  86.2 kg   Height:  5\' 10"  (1.778 m)     Physical Exam Constitutional:      General: He is not in acute distress.    Appearance: Normal appearance.  HENT:     Head: Normocephalic and atraumatic.     Mouth/Throat:     Mouth: Mucous membranes are moist.     Pharynx: Oropharynx is clear.  Eyes:     Extraocular Movements: Extraocular movements intact.     Pupils: Pupils are equal, round, and reactive to light.  Cardiovascular:     Rate and Rhythm: Normal rate and regular rhythm.     Heart sounds: Normal heart sounds.  Pulmonary:     Effort: Pulmonary effort is normal. No respiratory distress.     Breath sounds: Normal breath sounds.  Abdominal:     General: Bowel sounds are normal. There is no distension.     Palpations: Abdomen  is soft.     Tenderness: There is no abdominal tenderness.  Musculoskeletal:        General: No swelling or deformity.     Comments: Dry gangrene left 4th and 5th toes.   Skin:    General: Skin is warm and dry.  Neurological:     General: No focal deficit present.     Mental Status: Mental status is at baseline.    Labs on Admission: I have personally reviewed following labs and imaging studies  CBC: Recent Labs  Lab 10/21/23 1327  WBC 15.7*  NEUTROABS 12.5*  HGB 12.2*  HCT 38.7*  MCV 98.2  PLT 432*    Basic Metabolic Panel: Recent Labs  Lab 10/21/23 1327  NA 135  K 4.2  CL 100  CO2 23  GLUCOSE 114*  BUN 9  CREATININE 1.04  CALCIUM 8.4*    GFR: Estimated Creatinine Clearance: 68.2 mL/min (by  C-G formula based on SCr of 1.04 mg/dL).  Liver Function Tests: Recent Labs  Lab 10/21/23 1327  AST 19  ALT 10  ALKPHOS 51  BILITOT 0.4  PROT 8.2*  ALBUMIN 2.4*    Urine analysis:    Component Value Date/Time   COLORURINE YELLOW 08/13/2014 1330   APPEARANCEUR CLEAR 08/13/2014 1330   LABSPEC 1.010 08/13/2014 1330   PHURINE 7.5 08/13/2014 1330   GLUCOSEU NEGATIVE 08/13/2014 1330   HGBUR NEGATIVE 08/13/2014 1330   BILIRUBINUR NEGATIVE 08/13/2014 1330   KETONESUR NEGATIVE 08/13/2014 1330   PROTEINUR NEGATIVE 08/13/2014 1330   UROBILINOGEN 0.2 08/13/2014 1330   NITRITE NEGATIVE 08/13/2014 1330   LEUKOCYTESUR NEGATIVE 08/13/2014 1330    Radiological Exams on Admission: CT ANGIO AO+BIFEM W & OR WO CONTRAST Result Date: 10/21/2023 CLINICAL DATA:  Soft tissue infection suspected, left lower leg. EXAM: CT ANGIOGRAPHY OF ABDOMINAL AORTA WITH ILIOFEMORAL RUNOFF TECHNIQUE: Multidetector CT imaging of the abdomen, pelvis and lower extremities was performed using the standard protocol during bolus administration of intravenous contrast. Multiplanar CT image reconstructions and MIPs were obtained to evaluate the vascular anatomy. RADIATION DOSE REDUCTION: This exam was  performed according to the departmental dose-optimization program which includes automated exposure control, adjustment of the mA and/or kV according to patient size and/or use of iterative reconstruction technique. CONTRAST:  115mL OMNIPAQUE IOHEXOL 350 MG/ML SOLN COMPARISON:  None Available. FINDINGS: VASCULAR Aorta: Normal caliber aorta without aneurysm, dissection, vasculitis or significant stenosis. Aortic atherosclerosis. Celiac: Excluded from the field of view. SMA: Excluded from the field of view. Renals: Excluded from the field of view. IMA: Patent. RIGHT Lower Extremity Inflow: There is heavy atherosclerotic calcification of the common iliac arteries bilaterally with severe stenosis bilaterally. No dissection is seen. The external iliac arteries are patent with moderate stenosis on the right. The internal iliac artery is not well evaluated due to heavy calcification. Outflow: Heavy atherosclerotic calcification with multifocal severe stenosis involving in the superficial femoral and popliteal arteries. The common femoral and profunda femoral arteries appear patent Runoff: Heavy atherosclerotic calcification. The distal peroneal, anterior tibial, and posterior tibial arteries appear patent at the level of the ankle, however there is likely severe high-grade stenosis. LEFT Lower Extremity Inflow: Heavy atherosclerotic calcification. There is severe stenosis involving the common and internal iliac arteries. The internal iliac artery is not well evaluated due to heavy calcification. Outflow: Heavy atherosclerotic calcification with multifocal severe stenosis involving the superficial femoral and popliteal artery. Runoff: Heavy atherosclerotic calcification. The distal calf vessels at the ankle appear patent, however there is likely multifocal high-grade stenosis. Veins: No obvious venous abnormality within the limitations of this arterial phase study. Review of the MIP images confirms the above findings.  NON-VASCULAR Lower chest: Excluded from the field of view. Hepatobiliary: Excluded from the field of view. Pancreas: Excluded from the field of view. Spleen: Excluded from the field of view. Adrenals/Urinary Tract: The adrenal glands are not visualized. Hypodensities are noted in the lower pole of the left kidney which are too small to further characterize. The bladder is unremarkable. Stomach/Bowel: Colonic diverticulosis without diverticulitis. No acute abnormality is seen. Lymphatic: No lymphadenopathy by size criteria. Reproductive: Prostate is unremarkable. Other: No abdominopelvic ascites. Musculoskeletal: Degenerative changes are present in the lower lumbar spine. No acute fracture or dislocation. Degenerative changes are noted at the knees bilaterally. Air is identified in the soft tissues of the left foot about the fourth and fifth digits and distal metatarsals. Subcutaneous edema is noted about the distal left  lower extremity and foot. No obvious abscess is seen IMPRESSION: VASCULAR 1. Severe multifocal high-grade stenosis involving the bilateral common femoral, superficial femoral, popliteal, and anterior tibial, posterior tibial and peroneal arteries bilaterally. 2. Aortic atherosclerosis. NON-VASCULAR 1. Subcutaneous edema with subcutaneous gas involving in the lateral aspect of the left foot. No obvious abscess is seen. Evaluation for osteomyelitis is limited due to field of view. 2. Colonic diverticulosis. Electronically Signed   By: Wyvonnia Heimlich M.D.   On: 10/21/2023 16:55   DG Foot Complete Left Result Date: 10/21/2023 CLINICAL DATA:  Black fifth toe EXAM: LEFT FOOT - COMPLETE 3+ VIEW COMPARISON:  None Available. FINDINGS: Hallux valgus deformity. Subcutaneous gas about the head of the fifth metatarsal and proximal phalanx left fifth toe. Cortical loss medially from the fifth metatarsal head. No fracture or dislocation. Mild diffuse osteopenia. Calcaneal spur. Extensive arterial calcifications.  IMPRESSION: Subcutaneous gas about the head of the fifth metatarsal and proximal phalanx left fifth toe, with cortical loss medially from the fifth metatarsal head, consistent with osteomyelitis. Electronically Signed   By: Nicoletta Barrier M.D.   On: 10/21/2023 14:11   EKG: Not performed in the emergency department  Assessment/Plan Principal Problem:   Osteomyelitis [facility]) Active Problems:   CAD (coronary artery disease)   History of prolonged Q-T interval on ECG   Essential hypertension, benign   Hyperlipidemia   COPD (chronic obstructive pulmonary disease) (HCC)   GAD (generalized anxiety disorder)   Atrial flutter (HCC)   Alcohol abuse   PAD Limb ischemia Dry gangrene Osteomyelitis > Worsening toe pain and discoloration for the last 2 weeks. > Clear clinical dry gangrene on exam.  > Imaging showed osteomyelitis.  Arterial imaging showed severe stenosis of bilateral femoral arteries, popliteal arteries, tibial arteries, peroneal arteries. > Initially started on vancomycin and Zosyn in the ED.  Gas also seen on imaging. Appears to communicate with surface, but coverage for necrotizing infection added with Clinda in ED, switch to Linezolid. > Vascular surgery consulted and will see the patient tomorrow per EDP notes, request orthopedic surgery involvement. - Monitor in progressive unit overnight - Appreciate vascular surgery recommendations and assistance - Consult to Orthopedic Surgery on call - Continue with Zosyn - Change vancomycin to linezolid for necrotizing infection coverage - Is on warfarin with elevated INR, plan to start heparin when INR down to 2 - As needed pain medication - N.p.o. at midnight in case surgical intervention planned for tomorrow - Continue home rosuvastatin  Hypertension - Continue home amlodipine, metoprolol - Holding lisinopril until confirmed  Hyperlipidemia - Continue home rosuvastatin  Atrial fibrillation/flutter - Continue home metoprolol -  Holding warfarin - Heparin when INR appropriate  CAD - Continue home metoprolol, rosuvastatin - Holding warfarin in favor of heparin  COPD - Continue home Breztri  History of alcohol use - Noted - CIWA without Ativan  DVT prophylaxis: On warfarin, start heparin when INR improves, pharmacy consulted Code Status:   Full Family Communication:  Arvid Birk updated at bedside.  It is requested that we do not update the patient's sister as she is currently sick and admitted to [facility]. Disposition Plan:   Patient is from:  Home  Anticipated DC to:  Home  Anticipated DC date:  2 to 7 days  Anticipated DC barriers: None  Consults called:  Vascular surgery, orthopedic surgery. Admission status:  Inpatient, progressive  Severity of Illness: The appropriate patient status for this patient is INPATIENT. Inpatient status is judged to be reasonable and necessary in  order to provide the required intensity of service to ensure the patient's safety. The patient's presenting symptoms, physical exam findings, and initial radiographic and laboratory data in the context of their chronic comorbidities is felt to place them at high risk for further clinical deterioration. Furthermore, it is not anticipated that the patient will be medically stable for discharge from the hospital within 2 midnights of admission.   * I certify that at the point of admission it is my clinical judgment that the patient will require inpatient hospital care spanning beyond 2 midnights from the point of admission due to high intensity of service, high risk for further deterioration and high frequency of surveillance required.Johnetta Nab MD [facility]  How to contact the [facility] Attending or Consulting provider 7A - 7P or covering provider during after hours 7P -7A, for this patient?   Check the care team in [facility] and look for a) attending/consulting [facility] provider listed and b) the [facility] team listed Log into  [email or web address] and use [place]'s universal password to access. If you do not have the password, please contact the hospital operator. Locate the [facility] provider you are looking for under [facility] and page to a number that you can be directly reached. If you still have difficulty reaching the provider, please page the [facility] (Director on Call) for the Hospitalists listed on amion for assistance.  10/21/2023, 5:44 PM

## 2023-10-21 NOTE — ED Provider Notes (Signed)
 Assume Care Note  Vitals  BP 122/68   Pulse 97   Temp 97.7 F (36.5 C)   Resp 16   Ht 5\' 10"  (1.778 m)   Wt 86.2 kg   SpO2 99%   BMI 27.26 kg/m    ED Course / MDM   Clinical Course as of 10/21/23 1525  Sun Oct 21, 2023  1408 Dr Rosalva Comber with vascular consulted.  Patient agrees to see this patient in consult, will see tomorrow and recommend admitting to medicine. [JB]  1502 S- necrotic 4th and 5th toes xfew days, periph art disease and diabetes, subcutaneous gas on xray,vanc zosyn ordered [ ]  pend scans and admit to medicine [AO]    Clinical Course User Index [AO] Lorain Robson, MD [JB] Barrett, Kandace Organ, PA-C   Medical Decision Making Amount and/or Complexity of Data Reviewed Labs: ordered. Radiology: ordered.  Risk Prescription drug management. Decision regarding hospitalization.   I reassessed the patient shortly after handoff.  Left foot with fourth and fifth digit demonstrating obvious necrosis and ascending erythema up the leg.  No palpable crepitus or drainage, palpable DP pulse.  Will add clindamycin for antitoxin coverage though low risk for nec fasc per [facility].  Patient will be admitted to medicine for further monitoring management.  Handoff was given to admitting team.       Lorain Robson, MD 10/21/23 4098    Onetha Bile, MD 10/24/23 1500

## 2023-10-21 NOTE — ED Triage Notes (Signed)
 Pt reports black pinky toe for a few days. 2 toes are black and redness around. Wound has foul odor.

## 2023-10-22 ENCOUNTER — Inpatient Hospital Stay (HOSPITAL_COMMUNITY)

## 2023-10-22 ENCOUNTER — Other Ambulatory Visit (HOSPITAL_COMMUNITY)

## 2023-10-22 DIAGNOSIS — I96 Gangrene, not elsewhere classified: Secondary | ICD-10-CM

## 2023-10-22 DIAGNOSIS — I7090 Unspecified atherosclerosis: Secondary | ICD-10-CM | POA: Diagnosis not present

## 2023-10-22 DIAGNOSIS — I70245 Atherosclerosis of native arteries of left leg with ulceration of other part of foot: Secondary | ICD-10-CM | POA: Diagnosis not present

## 2023-10-22 LAB — COMPREHENSIVE METABOLIC PANEL WITH GFR
ALT: 12 U/L (ref 0–44)
AST: 20 U/L (ref 15–41)
Albumin: 2.2 g/dL — ABNORMAL LOW (ref 3.5–5.0)
Alkaline Phosphatase: 49 U/L (ref 38–126)
Anion gap: 13 (ref 5–15)
BUN: 8 mg/dL (ref 8–23)
CO2: 25 mmol/L (ref 22–32)
Calcium: 8.7 mg/dL — ABNORMAL LOW (ref 8.9–10.3)
Chloride: 100 mmol/L (ref 98–111)
Creatinine, Ser: 1.19 mg/dL (ref 0.61–1.24)
GFR, Estimated: >60 mL/min (ref >60)
Glucose, Bld: 89 mg/dL (ref 70–99)
Potassium: 3.8 mmol/L (ref 3.5–5.1)
Sodium: 138 mmol/L (ref 135–145)
Total Bilirubin: 0.6 mg/dL (ref 0.0–1.2)
Total Protein: 7.4 g/dL (ref 6.5–8.1)

## 2023-10-22 LAB — C-REACTIVE PROTEIN: CRP: 9.9 mg/dL — ABNORMAL HIGH (ref <1.0)

## 2023-10-22 LAB — PROCALCITONIN: Procalcitonin: <0.1 ng/mL

## 2023-10-22 LAB — CBC
HCT: 37.2 % — ABNORMAL LOW (ref 39.0–52.0)
Hemoglobin: 11.9 g/dL — ABNORMAL LOW (ref 13.0–17.0)
MCH: 30.8 pg (ref 26.0–34.0)
MCHC: 32 g/dL (ref 30.0–36.0)
MCV: 96.4 fL (ref 80.0–100.0)
Platelets: 385 10*3/uL (ref 150–400)
RBC: 3.86 MIL/uL — ABNORMAL LOW (ref 4.22–5.81)
RDW: 12.2 % (ref 11.5–15.5)
WBC: 11.2 10*3/uL — ABNORMAL HIGH (ref 4.0–10.5)
nRBC: 0 % (ref 0.0–0.2)

## 2023-10-22 LAB — PROTIME-INR
INR: 3.9 — ABNORMAL HIGH (ref 0.8–1.2)
Prothrombin Time: 38.3 s — ABNORMAL HIGH (ref 11.4–15.2)

## 2023-10-22 LAB — HIV ANTIBODY (ROUTINE TESTING W REFLEX): HIV Screen 4th Generation wRfx: NONREACTIVE

## 2023-10-22 LAB — VAS US ABI WITH/WO TBI
Left ABI: 2.02
Right ABI: 2.02

## 2023-10-22 MED ORDER — ASPIRIN 81 MG PO CHEW
81.0000 mg | CHEWABLE_TABLET | Freq: Every day | ORAL | Status: DC
  Administered 2023-10-24 – 2023-10-31 (×7): 81 mg via ORAL
  Filled 2023-10-22 (×7): qty 1

## 2023-10-22 MED ORDER — AMLODIPINE BESYLATE 5 MG PO TABS
5.0000 mg | ORAL_TABLET | Freq: Every day | ORAL | Status: DC
  Administered 2023-10-24 – 2023-11-06 (×14): 5 mg via ORAL
  Filled 2023-10-22 (×14): qty 1

## 2023-10-22 MED ORDER — LINEZOLID 600 MG PO TABS
600.0000 mg | ORAL_TABLET | Freq: Two times a day (BID) | ORAL | Status: DC
  Administered 2023-10-22 – 2023-11-01 (×21): 600 mg via ORAL
  Filled 2023-10-22 (×21): qty 1

## 2023-10-22 MED ORDER — ASPIRIN 81 MG PO CHEW: 81.0000 mg | CHEWABLE_TABLET | Freq: Every day | ORAL | Status: DC

## 2023-10-22 NOTE — Progress Notes (Signed)
 PROGRESS NOTE                                                                                                                                                                                                             Patient Demographics:    Jason Spencer, is a 73 y.o. male, DOB - 04-Oct-1950, IRC:[id number]  Outpatient Primary MD for the patient is Jason Spencer, FNP    LOS - 1  Admit date - 10/21/2023    Chief Complaint  Patient presents with   Circulatory Problem       Brief Narrative (HPI from H&P)   73 y.o. male with medical history significant of hypertension, hyperlipidemia, atrial fibrillation/flutter, CAD, COPD, QT prolongation, dCHF, alcohol use, anxiety, pleural effusion presenting with worsening toe pain.   Patient has had some worsening toe pain and discoloration for the past 2 weeks.  He has noticed some gradual worsening pain and discoloration to the point where the toes are now turned black on the left foot.  Changes are at his 4th and 5th digit.  Has not become difficult to walk. He lives with his sister but she has been in the hospital last couple of weeks and someone came to check on him yesterday and recommended he be seen in the ED.   Subjective:    Jason Spencer today has, No headache, No chest pain, No abdominal pain - No Nausea, No new weakness tingling or numbness, mild L foot pain   Assessment  & Plan :   Severe PAD with ongoing smoking, here with critical left foot gangrene and osteomyelitis present on admission. He is on Coumadin, INR in therapeutic range,  continue statin and beta-blocker at home dose, strictly counseled to quit smoking.  Continue empiric antibiotics which are vancomycin and Zosyn, orthopedics and vascular surgery on board, vascular surgery planning for an angiogram morning of 10/23/2023.  Baby aspirin after procedures are done, will likely require at the very minimum left  transmetatarsal amputation if not more.  Follow cultures and monitor.  Chronic atrial fibrillation.  Continue home dose beta-blocker, on Coumadin, INR supratherapeutic Coumadin on hold  Lab Results  Component Value Date   INR 3.9 (H) 10/22/2023   INR 3.5 (H) 10/21/2023   INR 2.3 (H) 09/21/2023   CAD.  Stable no acute issues, no rest chest pain,  on beta-blocker and statin continue, Coumadin as above, strictly counseled to quit smoking, will check baseline echocardiogram to evaluate EF and wall motion.  History of smoking and alcohol abuse.  Strictly counseled to quit both, CIWA protocol.  Dyslipidemia.  On statin.  Hypertension.  Continue combination of beta-blocker and Norvasc.  Monitor.       Condition - Extremely Guarded  Family Communication  :  none present  Code Status :  Full  Consults  :  Ortho, VVS  PUD Prophylaxis :    Procedures  :     TTE -   CTA -   VASCULAR 1. Severe multifocal high-grade stenosis involving the bilateral common femoral, superficial femoral, popliteal, and anterior tibial, posterior tibial and peroneal arteries bilaterally. 2. Aortic atherosclerosis.   NON-VASCULAR 1. Subcutaneous edema with subcutaneous gas involving in the lateral aspect of the left foot. No obvious abscess is seen. Evaluation for osteomyelitis is limited due to field of view. 2. Colonic diverticulosis      Disposition Plan  :    Status is: Inpatient   DVT Prophylaxis  :  heparin    Lab Results  Component Value Date   PLT 385 10/22/2023    Diet :  Diet Order             Diet NPO time specified Except for: Sips with Meds  Diet effective midnight           Diet Heart Room service appropriate? Yes; Fluid consistency: Thin  Diet effective now                    Inpatient Medications  Scheduled Meds:  [START ON 10/24/2023] amLODipine  5 mg Oral Daily   [START ON 10/24/2023] aspirin  81 mg Oral Daily   budeson-glycopyrrolate-formoterol  2 puff  Inhalation BID   linezolid  600 mg Oral Q12H   metoprolol succinate  100 mg Oral Daily   rosuvastatin  5 mg Oral Daily   sodium chloride flush  3 mL Intravenous Q12H   Continuous Infusions:  piperacillin-tazobactam 3.375 g (10/22/23 0515)   PRN Meds:.acetaminophen **OR** acetaminophen, HYDROcodone-acetaminophen, polyethylene glycol    Objective:   Vitals:   10/21/23 2332 10/22/23 0446 10/22/23 0750 10/22/23 0813  BP: 93/61 116/68 119/73   Pulse: 73 88 83 85  Resp: 20 19 (!) 24 17  Temp: 97.8 F (36.6 C) 97.8 F (36.6 C) 99 F (37.2 C)   TempSrc: Oral Oral Oral   SpO2: 94%  94% 94%  Weight:      Height:        Wt Readings from Last 3 Encounters:  10/21/23 86.2 kg  09/21/23 87.2 kg  09/06/23 89.3 kg     Intake/Output Summary (Last 24 hours) at 10/22/2023 0955 Last data filed at 10/22/2023 0452 Gross per 24 hour  Intake 590.79 ml  Output 400 ml  Net 190.79 ml     Physical Exam  Awake Alert, No new F.N deficits, Normal affect [place].AT,PERRAL Supple Neck, No JVD,   Symmetrical Chest wall movement, Good air movement bilaterally, CTAB RRR,No Gallops,Rubs or new Murmurs,  +ve B.Sounds, Abd Soft, No tenderness,   Left foot picture below from 10/22/2023, both lower extremities have signs of chronic ischemia, feebly felt pulses        Data Review:    Recent Labs  Lab 10/21/23 1327 10/22/23 0346  WBC 15.7* 11.2*  HGB 12.2* 11.9*  HCT 38.7* 37.2*  PLT 432* 385  MCV  98.2 96.4  MCH 31.0 30.8  MCHC 31.5 32.0  RDW 12.0 12.2  LYMPHSABS 1.3  --   MONOABS 1.5*  --   EOSABS 0.1  --   BASOSABS 0.1  --     Recent Labs  Lab 10/21/23 1327 10/21/23 1341 10/21/23 1424 10/21/23 1600 10/22/23 0346  NA 135  --   --   --  138  K 4.2  --   --   --  3.8  CL 100  --   --   --  100  CO2 23  --   --   --  25  ANIONGAP 12  --   --   --  13  GLUCOSE 114*  --   --   --  89  BUN 9  --   --   --  8  CREATININE 1.04  --   --   --  1.19  AST 19  --   --   --  20  ALT 10   --   --   --  12  ALKPHOS 51  --   --   --  49  BILITOT 0.4  --   --   --  0.6  ALBUMIN 2.4*  --   --   --  2.2*  CRP  --   --   --  9.0*  --   LATICACIDVEN  --  1.6  --   --   --   INR  --   --  3.5*  --  3.9*  CALCIUM 8.4*  --   --   --  8.7*      Recent Labs  Lab 10/21/23 1327 10/21/23 1341 10/21/23 1424 10/21/23 1600 10/22/23 0346  CRP  --   --   --  9.0*  --   LATICACIDVEN  --  1.6  --   --   --   INR  --   --  3.5*  --  3.9*  CALCIUM 8.4*  --   --   --  8.7*    --------------------------------------------------------------------------------------------------------------- Lab Results  Component Value Date   CHOL 213 (H) 09/06/2023   HDL 40 09/06/2023   LDLCALC 123 (H) 09/06/2023   TRIG 281 (H) 09/06/2023   CHOLHDL 5.3 (H) 09/06/2023    Lab Results  Component Value Date   HGBA1C 5.5 04/27/2016   No results for input(s): "TSH", "T4TOTAL", "FREET4", "T3FREE", "THYROIDAB" in the last 72 hours. No results for input(s): "VITAMINB12", "FOLATE", "FERRITIN", "TIBC", "IRON", "RETICCTPCT" in the last 72 hours. ------------------------------------------------------------------------------------------------------------------ Cardiac Enzymes No results for input(s): "CKMB", "TROPONINI", "MYOGLOBIN" in the last 168 hours.  Invalid input(s): "CK"  Micro Results Recent Results (from the past 240 hours)  Blood culture (routine x 2)     Status: None (Preliminary result)   Collection Time: 10/21/23  1:30 PM   Specimen: BLOOD LEFT ARM  Result Value Ref Range Status   Specimen Description BLOOD LEFT ARM  Final   Special Requests   Final    BOTTLES DRAWN AEROBIC AND ANAEROBIC Blood Culture results may not be optimal due to an inadequate volume of blood received in culture bottles   Culture   Final    NO GROWTH < 24 HOURS Performed at [facility], [address], [place], [place] [address]    Report Status PENDING  Incomplete  Blood culture (routine x 2)     Status:  None (Preliminary result)   Collection Time: 10/21/23  2:24 PM   Specimen: BLOOD  Result Value Ref Range Status   Specimen Description BLOOD RIGHT ANTECUBITAL  Final   Special Requests   Final    BOTTLES DRAWN AEROBIC AND ANAEROBIC Blood Culture adequate volume   Culture   Final    NO GROWTH < 24 HOURS Performed at [facility], [address] [place], [place] [address]    Report Status PENDING  Incomplete  MRSA Next Gen by PCR, Nasal     Status: None   Collection Time: 10/21/23  9:15 PM   Specimen: Nasal Mucosa; Nasal Swab  Result Value Ref Range Status   MRSA by PCR Next Gen NOT DETECTED NOT DETECTED Final    Comment: (NOTE) The GeneXpert MRSA Assay (FDA approved for NASAL specimens only), is one component of a comprehensive MRSA colonization surveillance program. It is not intended to diagnose MRSA infection nor to guide or monitor treatment for MRSA infections. Test performance is not FDA approved in patients less than 78 years old. Performed at [facility], [address], [place], [place] [address]     Radiology Report DG Chest Port 1 View Result Date: 10/22/2023 CLINICAL DATA:  Shortness of breath EXAM: PORTABLE CHEST 1 VIEW COMPARISON:  08/19/2014 FINDINGS: Chronic extrapleural opacity in the right upper chest with probable pleural calcification, this was loculated pleural fluid by 2016 chest CT. The left lung is clear. Normal heart size and mediastinal contours. Artifact from EKG leads. 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 No acute superimposed finding. Electronically Signed   By: Ronnette Coke M.D.   On: 10/22/2023 06:53   CT ANGIO AO+BIFEM W & OR WO CONTRAST Result Date: 10/21/2023 CLINICAL DATA:  Soft tissue infection suspected, left lower leg. EXAM: CT ANGIOGRAPHY OF ABDOMINAL AORTA WITH ILIOFEMORAL RUNOFF TECHNIQUE: Multidetector CT imaging of the abdomen, pelvis and lower  extremities was performed using the standard protocol during bolus administration of intravenous contrast. Multiplanar CT image reconstructions and MIPs were obtained to evaluate the vascular anatomy. RADIATION DOSE REDUCTION: This exam was performed according to the departmental dose-optimization program which includes automated exposure control, adjustment of the mA and/or kV according to patient size and/or use of iterative reconstruction technique. CONTRAST:  115mL OMNIPAQUE IOHEXOL 350 MG/ML SOLN COMPARISON:  None Available. FINDINGS: VASCULAR Aorta: Normal caliber aorta without aneurysm, dissection, vasculitis or significant stenosis. Aortic atherosclerosis. Celiac: Excluded from the field of view. SMA: Excluded from the field of view. Renals: Excluded from the field of view. IMA: Patent. RIGHT Lower Extremity Inflow: There is heavy atherosclerotic calcification of the common iliac arteries bilaterally with severe stenosis bilaterally. No dissection is seen. The external iliac arteries are patent with moderate stenosis on the right. The internal iliac artery is not well evaluated due to heavy calcification. Outflow: Heavy atherosclerotic calcification with multifocal severe stenosis involving in the superficial femoral and popliteal arteries. The common femoral and profunda femoral arteries appear patent Runoff: Heavy atherosclerotic calcification. The distal peroneal, anterior tibial, and posterior tibial arteries appear patent at the level of the ankle, however there is likely severe high-grade stenosis. LEFT Lower Extremity Inflow: Heavy atherosclerotic calcification. There is severe stenosis involving the common and internal iliac arteries. The internal iliac artery is not well evaluated due to heavy calcification. Outflow: Heavy atherosclerotic calcification with multifocal severe stenosis involving the superficial femoral and popliteal artery. Runoff: Heavy atherosclerotic calcification. The distal calf  vessels at the ankle  appear patent, however there is likely multifocal high-grade stenosis. Veins: No obvious venous abnormality within the limitations of this arterial phase study. Review of the MIP images confirms the above findings. NON-VASCULAR Lower chest: Excluded from the field of view. Hepatobiliary: Excluded from the field of view. Pancreas: Excluded from the field of view. Spleen: Excluded from the field of view. Adrenals/Urinary Tract: The adrenal glands are not visualized. Hypodensities are noted in the lower pole of the left kidney which are too small to further characterize. The bladder is unremarkable. Stomach/Bowel: Colonic diverticulosis without diverticulitis. No acute abnormality is seen. Lymphatic: No lymphadenopathy by size criteria. Reproductive: Prostate is unremarkable. Other: No abdominopelvic ascites. Musculoskeletal: Degenerative changes are present in the lower lumbar spine. No acute fracture or dislocation. Degenerative changes are noted at the knees bilaterally. Air is identified in the soft tissues of the left foot about the fourth and fifth digits and distal metatarsals. Subcutaneous edema is noted about the distal left lower extremity and foot. No obvious abscess is seen IMPRESSION: VASCULAR 1. Severe multifocal high-grade stenosis involving the bilateral common femoral, superficial femoral, popliteal, and anterior tibial, posterior tibial and peroneal arteries bilaterally. 2. Aortic atherosclerosis. NON-VASCULAR 1. Subcutaneous edema with subcutaneous gas involving in the lateral aspect of the left foot. No obvious abscess is seen. Evaluation for osteomyelitis is limited due to field of view. 2. Colonic diverticulosis. Electronically Signed   By: Wyvonnia Heimlich M.D.   On: 10/21/2023 16:55   DG Foot Complete Left Result Date: 10/21/2023 CLINICAL DATA:  Black fifth toe EXAM: LEFT FOOT - COMPLETE 3+ VIEW COMPARISON:  None Available. FINDINGS: Hallux valgus deformity. Subcutaneous gas  about the head of the fifth metatarsal and proximal phalanx left fifth toe. Cortical loss medially from the fifth metatarsal head. No fracture or dislocation. Mild diffuse osteopenia. Calcaneal spur. Extensive arterial calcifications. IMPRESSION: Subcutaneous gas about the head of the fifth metatarsal and proximal phalanx left fifth toe, with cortical loss medially from the fifth metatarsal head, consistent with osteomyelitis. Electronically Signed   By: Nicoletta Barrier M.D.   On: 10/21/2023 14:11     Signature  -   Lynnwood Sauer M.D on 10/22/2023 at 9:55 AM   -  To page go to [email or web address]

## 2023-10-22 NOTE — Evaluation (Signed)
 Physical Therapy Evaluation Patient Details Name: Jason Spencer MRN: [id number] DOB: Jul 24, 1950 Today's Date: 10/22/2023  History of Present Illness  73 y.o. male admitted 4/13  presenting with worsening left 4th and 5th toe pain. Concern for osteomyelitis.  Dr. Julio Ohm to consult regarding amputations.  PMH: hypertension, hyperlipidemia, atrial fibrillation/flutter, CAD, COPD, QT prolongation, dCHF, alcohol use, anxiety, pleural effusion  Clinical Impression  Pt admitted with above diagnosis. Pt able to walk short distance to sink with RW and min assist but poor safety overall due to left foot pain.  Pt awaiting decision regarding amputations therefore will need to see progression of medical issues prior to recommending d/c plan.  May need post acute rehab but too early to decide. Will continue acute PT.  Of note, sister is currently being treated at [facility] per pt.  Pt currently with functional limitations due to the deficits listed below (see PT Problem List). Pt will benefit from acute skilled PT to increase their independence and safety with mobility to allow discharge.           If plan is discharge home, recommend the following: A little help with walking and/or transfers;A little help with bathing/dressing/bathroom;Assistance with cooking/housework;Assist for transportation;Help with stairs or ramp for entrance   Can travel by private vehicle        Equipment Recommendations Rolling walker (2 wheels);BSC/3in1  Recommendations for Other Services       Functional Status Assessment Patient has had a recent decline in their functional status and demonstrates the ability to make significant improvements in function in a reasonable and predictable amount of time.     Precautions / Restrictions Precautions Precautions: Fall Restrictions Weight Bearing Restrictions Per Provider Order: Yes LLE Weight Bearing Per Provider Order: Weight bearing as tolerated      Mobility   Bed Mobility Overal bed mobility: Needs Assistance Bed Mobility: Supine to Sit     Supine to sit: Supervision     General bed mobility comments: No assist needed but takes incr time as he moves slowly due to left foot pain    Transfers Overall transfer level: Needs assistance Equipment used: Rolling walker (2 wheels) Transfers: Sit to/from Stand Sit to Stand: Min assist           General transfer comment: Needed min assist for sit to stand as he needed cues for hand placement and a boost as he hasnt used RW before.  Once up, pt needed cues to use UEs to unweight left foot for comfort due to left foot pain.    Ambulation/Gait Ambulation/Gait assistance: Min assist Gait Distance (Feet): 6 Feet (6 feet x 2) Assistive device: Rolling walker (2 wheels) Gait Pattern/deviations: Step-to pattern, Decreased step length - left, Decreased stride length, Decreased stance time - left, Decreased weight shift to left, Antalgic, Trunk flexed, Wide base of support   Gait velocity interpretation: <1.31 ft/sec, indicative of household ambulator   General Gait Details: Pt with very antalgic gait as he cant weight bear easily on left foot. Pt needing cues and assist to unweight left foot with pt doing better with heel weight bearing. Pt with poor safety needing cues as he had incr pain after 6 feet and wanted to sit quickly and needed cues to slow down and sit safely in chair at sink.  Pt washed his face and hair and combed hair sitting at sink as well as brushed teeth. Cannot stand for prolonged periods of time due to pain. Pt only agreeable to  walk back to bed 6 feet.  Did a little better with walker safety on way back to bed but needs continued education.  Stairs            Wheelchair Mobility     Tilt Bed    Modified Rankin (Stroke Patients Only)       Balance Overall balance assessment: Needs assistance Sitting-balance support: No upper extremity supported, Feet  supported Sitting balance-Leahy Scale: Fair Sitting balance - Comments: Put socks on independently   Standing balance support: Bilateral upper extremity supported, During functional activity, Reliant on assistive device for balance Standing balance-Leahy Scale: Poor Standing balance comment: needs min assist with RW due to pain with weight bearing.                             Pertinent Vitals/Pain Pain Assessment Pain Assessment: 0-10 Pain Score: 7  Pain Location: left toes Pain Descriptors / Indicators: Aching, Discomfort, Grimacing, Guarding Pain Intervention(s): Limited activity within patient's tolerance, Monitored during session, Repositioned    Home Living Family/patient expects to be discharged to:: Private residence Living Arrangements: Other relatives (lives with sister) Available Help at Discharge: Family;Available PRN/intermittently (sister in [place] per pt) Type of Home: House Home Access: Stairs to enter Entrance Stairs-Rails: None Entrance Stairs-Number of Steps: 1 Alternate Level Stairs-Number of Steps: 2 (to kitchen) Home Layout: One level;Multi-level Home Equipment: Other (comment) (may have SW) Additional Comments: Sister in [place] with fluid issues per pt.    Prior Function Prior Level of Function : Independent/Modified Independent             Mobility Comments: Pt reports holding onto furniture but not needing assist PTA ADLs Comments: Sponge bathed at baseline, dressed self.     Extremity/Trunk Assessment   Upper Extremity Assessment Upper Extremity Assessment: Defer to OT evaluation    Lower Extremity Assessment Lower Extremity Assessment: LLE deficits/detail LLE: Unable to fully assess due to pain    Cervical / Trunk Assessment Cervical / Trunk Assessment: Kyphotic  Communication   Communication Communication: No apparent difficulties    Cognition Arousal: Alert Behavior During Therapy: WFL for tasks assessed/performed    PT - Cognitive impairments: No apparent impairments, Safety/Judgement, Problem solving                         Following commands: Intact       Cueing Cueing Techniques: Verbal cues, Tactile cues     General Comments General comments (skin integrity, edema, etc.): 90 bpm, 93% RA, 115/56    Exercises     Assessment/Plan    PT Assessment Patient needs continued PT services  PT Problem List Decreased activity tolerance;Decreased balance;Decreased mobility;Decreased strength;Decreased range of motion;Decreased knowledge of use of DME;Decreased safety awareness;Decreased knowledge of precautions;Pain;Decreased skin integrity;Impaired sensation       PT Treatment Interventions DME instruction;Gait training;Functional mobility training;Therapeutic activities;Therapeutic exercise;Balance training;Patient/family education    PT Goals (Current goals can be found in the Care Plan section)  Acute Rehab PT Goals Patient Stated Goal: to find out about surgery PT Goal Formulation: With patient Time For Goal Achievement: 11/05/23 Potential to Achieve Goals: Fair    Frequency Min 2X/week     Co-evaluation               AM-PAC PT "6 Clicks" Mobility  Outcome Measure Help needed turning from your back to your side while in a flat bed without  using bedrails?: None Help needed moving from lying on your back to sitting on the side of a flat bed without using bedrails?: None Help needed moving to and from a bed to a chair (including a wheelchair)?: A Little Help needed standing up from a chair using your arms (e.g., wheelchair or bedside chair)?: A Little Help needed to walk in hospital room?: Total Help needed climbing 3-5 steps with a railing? : Total 6 Click Score: 16    End of Session Equipment Utilized During Treatment: Gait belt Activity Tolerance: Patient limited by pain Patient left: in bed;with call bell/phone within reach;with bed alarm set Nurse Communication:  Mobility status PT Visit Diagnosis: Muscle weakness (generalized) (M62.81);Unsteadiness on feet (R26.81)    Time: 1610-9604 PT Time Calculation (min) (ACUTE ONLY): 30 min   Charges:   PT Evaluation $PT Eval Moderate Complexity: 1 Mod PT Treatments $Gait Training: 8-22 mins PT General Charges $$ ACUTE PT VISIT: 1 Visit         Reighan Hipolito M,PT Acute Rehab Services [phone]   Florencia Hunter 10/22/2023, 10:39 AM

## 2023-10-22 NOTE — Plan of Care (Signed)

## 2023-10-22 NOTE — Consult Note (Signed)
 Hospital Consult    Reason for Consult: Left lower extremity critical ischemia tissue loss Requesting Physician: ED MRN #:  [id number]  History of Present Illness: This is a 73 y.o. male with distant history of right sided venous ablation by my partner Dr. Nolene Baumgarten.    Patient presented with a several week history of blackened left 4th and 5th toes.  He stated the toes continue to worsen, but he was hopeful they would get better.  He denies erythema, notes pain in the forefoot.  No drainage.  History of claudication bilaterally.   Past Medical History:  Diagnosis Date   Acute CHF (HCC)    Maximo Spar 08/13/2014   Atrial fibrillation with RVR (HCC) 08/13/2014   new onset/notes 08/13/2014   CAP (community acquired pneumonia) 07/2014   Cardiomyopathy (HCC)    Migraine 1970     Past Surgical History:  Procedure Laterality Date   CARDIOVERSION N/A 08/19/2014   Procedure: CARDIOVERSION;  Surgeon: Darlis Eisenmenger, MD;  Location: [facility] ENDOSCOPY;  Service: Cardiovascular;  Laterality: N/A;   ENDOVENOUS ABLATION SAPHENOUS VEIN W/ LASER Right 05/01/2018   endovenous laser ablation right greater saphenous vein by Stacia Dynes MD    LEFT AND RIGHT HEART CATHETERIZATION WITH CORONARY ANGIOGRAM N/A 08/17/2014   Procedure: LEFT AND RIGHT HEART CATHETERIZATION WITH CORONARY ANGIOGRAM;  Surgeon: Mickiel Albany, MD;  Location: [facility] CATH LAB;  Service: Cardiovascular;  Laterality: N/A;   NO PAST SURGERIES     PERCUTANEOUS CORONARY STENT INTERVENTION (PCI-S)  08/17/2014   Procedure: PERCUTANEOUS CORONARY STENT INTERVENTION (PCI-S);  Surgeon: Mickiel Albany, MD;  Location: [facility] CATH LAB;  Service: Cardiovascular;;   TEE WITHOUT CARDIOVERSION N/A 08/19/2014   Procedure: TRANSESOPHAGEAL ECHOCARDIOGRAM (TEE);  Surgeon: Darlis Eisenmenger, MD;  Location: [facility] ENDOSCOPY;  Service: Cardiovascular;  Laterality: N/A;    Allergies  Allergen Reactions   Shellfish Allergy Nausea And Vomiting and Other (See Comments)    Shrimp,  especially    Prior to Admission medications   Medication Sig Start Date End Date Taking? Authorizing Provider  amLODipine (NORVASC) 5 MG tablet TAKE ONE TABLET ONCE DAILY 09/26/23  Yes Hawks, Christy A, FNP  Budeson-Glycopyrrol-Formoterol (BREZTRI AEROSPHERE) 160-9-4.8 MCG/ACT AERO Inhale 2 puffs into the lungs 2 (two) times daily. Patient taking differently: Inhale 1 puff into the lungs daily. 09/15/22  Yes Hawks, Christy A, FNP  lisinopril (ZESTRIL) 40 MG tablet TAKE ONE TABLET ONCE DAILY 09/26/23  Yes Hawks, Kathaleen Pale A, FNP  metoprolol succinate (TOPROL-XL) 100 MG 24 hr tablet TAKE 1 TABLET WITH OR IMMEDIATELY FOLLOWING A MEAL 09/26/23  Yes Hawks, Christy A, FNP  VASELINE PURE ULTRA WHITE ointment Apply 1 application  topically as needed (for psoriasis- affected areas).   Yes [provider]  warfarin (COUMADIN) 5 MG tablet TAKE ONE TABLET ON MONDAY, WEDNESDAY, AND FRIDAY. TAKE 1 AND 1/2 TABLETS ON ALL other DAYS Patient taking differently: Take 5-7.5 mg by mouth See admin instructions. Take 7.5 mg by mouth in the morning on Sun/Tues/Thurs/Sat and 5 mg on Mon/Wed/Fri 09/21/23  Yes Hawks, Christy A, FNP  lidocaine 4 % Place 2 patches onto the skin daily. Patient not taking: Reported on 10/21/2023 07/06/23   Chrystine Crate, FNP  rosuvastatin (CRESTOR) 5 MG tablet Take 1 tablet (5 mg total) by mouth daily. Patient not taking: Reported on 10/21/2023 09/15/22   Tommas Fragmin A, FNP  triamcinolone ointment (KENALOG) 0.5 % Apply 1 Application topically 2 (two) times daily. Patient not taking: Reported on 10/21/2023  05/10/23   [name], FNP    Social History   Socioeconomic History   Marital status: Single    Spouse name: Not on file   Number of children: Not on file   Years of education: Not on file   Highest education level: Not on file  Occupational History   Not on file  Tobacco Use   Smoking status: Every Day    Current packs/day: 0.50    Average packs/day: 0.5  packs/day for 44.0 years (22.0 ttl pk-yrs)    Types: Cigarettes   Smokeless tobacco: Never  Vaping Use   Vaping status: Never Used  Substance and Sexual Activity   Alcohol use: Yes    Alcohol/week: 0.0 standard drinks of alcohol    Comment: 08/13/2014 "I haven't drank in a month or so; if I drink it would be beer on a warm day"   Drug use: No   Sexual activity: Not Currently  Other Topics Concern   Not on file  Social History Narrative   Not on file   Social Drivers of Health   Financial Resource Strain: Low Risk  (01/08/2023)   Overall Financial Resource Strain (CARDIA)    Difficulty of Paying Living Expenses: Not hard at all  Food Insecurity: No Food Insecurity (10/21/2023)   Hunger Vital Sign    Worried About Running Out of Food in the Last Year: Never true    Ran Out of Food in the Last Year: Never true  Transportation Needs: No Transportation Needs (10/21/2023)   PRAPARE - Administrator, Civil Service (Medical): No    Lack of Transportation (Non-Medical): No  Physical Activity: Insufficiently Active (01/08/2023)   Exercise Vital Sign    Days of Exercise per Week: 2 days    Minutes of Exercise per Session: 20 min  Stress: Not on file  Social Connections: Moderately Integrated (10/22/2023)   Social Connection and Isolation Panel [NHANES]    Frequency of Communication with Friends and Family: Three times a week    Frequency of Social Gatherings with Friends and Family: Three times a week    Attends Religious Services: 1 to 4 times per year    Active Member of Clubs or Organizations: No    Attends Banker Meetings: 1 to 4 times per year    Marital Status: Never married  Intimate Partner Violence: Not At Risk (10/21/2023)   Humiliation, Afraid, Rape, and Kick questionnaire    Fear of Current or Ex-Partner: No    Emotionally Abused: No    Physically Abused: No    Sexually Abused: No   Family History  Problem Relation Age of Onset   Heart disease Mother         VALUE REPLACEMENT   COPD Father    Aneurysm Father     ROS: Otherwise negative unless mentioned in HPI  Physical Examination  Vitals:   10/22/23 0813 10/22/23 1236  BP:  103/86  Pulse: 85 83  Resp: 17 16  Temp:  (!) 97.5 F (36.4 C)  SpO2: 94% 92%   Body mass index is 27.26 kg/m.  General:  WDWN in NAD Gait: Not observed HENT: WNL, normocephalic Pulmonary: normal non-labored breathing, without Rales, rhonchi,  wheezing Cardiac: regular Abdomen:  soft, NT/ND, no masses Skin: without rashes Vascular Exam/Pulses: 2+ femorals bilaterally Extremities: with ischemic changes, with Gangrene , with cellulitis; with open wounds; severe psoriasis Musculoskeletal: no muscle wasting or atrophy  Neurologic: A&O X 3;  No  focal weakness or paresthesias are detected; speech is fluent/normal Psychiatric:  The pt has Normal affect. Lymph:  Unremarkable  CBC    Component Value Date/Time   WBC 11.2 (H) 10/22/2023 0346   RBC 3.86 (L) 10/22/2023 0346   HGB 11.9 (L) 10/22/2023 0346   HGB 13.2 09/06/2023 1124   HCT 37.2 (L) 10/22/2023 0346   HCT 39.5 09/06/2023 1124   PLT 385 10/22/2023 0346   PLT 254 09/06/2023 1124   MCV 96.4 10/22/2023 0346   MCV 95 09/06/2023 1124   MCH 30.8 10/22/2023 0346   MCHC 32.0 10/22/2023 0346   RDW 12.2 10/22/2023 0346   RDW 11.5 (L) 09/06/2023 1124   LYMPHSABS 1.3 10/21/2023 1327   LYMPHSABS 1.0 09/06/2023 1124   MONOABS 1.5 (H) 10/21/2023 1327   EOSABS 0.1 10/21/2023 1327   EOSABS 0.1 09/06/2023 1124   BASOSABS 0.1 10/21/2023 1327   BASOSABS 0.0 09/06/2023 1124    BMET    Component Value Date/Time   NA 138 10/22/2023 0346   NA 142 09/06/2023 1124   K 3.8 10/22/2023 0346   CL 100 10/22/2023 0346   CO2 25 10/22/2023 0346   GLUCOSE 89 10/22/2023 0346   BUN 8 10/22/2023 0346   BUN 11 09/06/2023 1124   CREATININE 1.19 10/22/2023 0346   CALCIUM 8.7 (L) 10/22/2023 0346   GFRNONAA >60 10/22/2023 0346   GFRAA 74 08/10/2020 1150     COAGS: Lab Results  Component Value Date   INR 3.9 (H) 10/22/2023   INR 3.5 (H) 10/21/2023   INR 2.3 (H) 09/21/2023     ASSESSMENT/PLAN: This is a 73 y.o. male with history of right-sided venous ablation with my partner Dr. Dal Dubin years ago.  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  Nonpalpable pulses in the foot.  ABI pending, but expect this to be low.  CT angio reviewed demonstrating multilevel occlusive disease.  I had a long conversation with him regarding the above.  He would benefit from left lower extremity angiography in effort to define and improve distal perfusion for wound healing.  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  Arteries appear circumferentially calcified.  Appreciate orthopedic surgery managing the foot wound.  Without revascularization, he is aware he will require high-level amputation, likely below the knee   Kayla Part MD MS [facility] [phone] 10/22/2023  3:10 PM]

## 2023-10-22 NOTE — Consult Note (Addendum)
 Orthopedic Surgery Consult Note  Assessment: Patient is a 73 y.o. male with left foot necrosis of the lateral toes   Plan: -Will discuss debridement/amputation with my partner, Dr. Lajoyce Corners -Okay for diet and dvt ppx from ortho perspective -Antibiotics: per primary -Weight bearing status: as tolerated -PT evaluate and treat -Pain control -Dispo: pending consultation with Dr. Lajoyce Corners  ___________________________________________________________________________   Reason for consult: left foot necrosis of the toes  History:  Patient is a 73 y.o. male who has been admitted to the hospitalist service. He has a long history of smoking. Has recently noticed his lateral toes on the left foot have been dark in color and turned black. Presented to the hospital on 4/13. Not having any pain in the foot.   Review of systems: General: denies fevers and chills, myalgias Neurologic: denies recent changes in vision, slurred speech Abdomen: denies nausea, vomiting, hematemesis Respiratory: denies cough, shortness of breath  Past Medical History:  Diagnosis Date   Acute CHF (HCC)    [name] 08/13/2014   Atrial fibrillation with RVR (HCC) 08/13/2014   new onset/notes 08/13/2014   CAP (community acquired pneumonia) 07/2014   Cardiomyopathy (HCC)    Migraine 1970    Allergies  Allergen Reactions   Shellfish Allergy Nausea And Vomiting and Other (See Comments)    Shrimp, especially    Past Surgical History:  Procedure Laterality Date   CARDIOVERSION N/A 08/19/2014   Procedure: CARDIOVERSION;  Surgeon: Laurey Morale, MD;  Location: [facility] ENDOSCOPY;  Service: Cardiovascular;  Laterality: N/A;   ENDOVENOUS ABLATION SAPHENOUS VEIN W/ LASER Right 05/01/2018   endovenous laser ablation right greater saphenous vein by [name] MD    LEFT AND RIGHT HEART CATHETERIZATION WITH CORONARY ANGIOGRAM N/A 08/17/2014   Procedure: LEFT AND RIGHT HEART CATHETERIZATION WITH CORONARY ANGIOGRAM;  Surgeon: Lesleigh Noe, MD;  Location: [facility] CATH LAB;  Service: Cardiovascular;  Laterality: N/A;   NO PAST SURGERIES     PERCUTANEOUS CORONARY STENT INTERVENTION (PCI-S)  08/17/2014   Procedure: PERCUTANEOUS CORONARY STENT INTERVENTION (PCI-S);  Surgeon: Lesleigh Noe, MD;  Location: [facility] CATH LAB;  Service: Cardiovascular;;   TEE WITHOUT CARDIOVERSION N/A 08/19/2014   Procedure: TRANSESOPHAGEAL ECHOCARDIOGRAM (TEE);  Surgeon: Laurey Morale, MD;  Location: [facility] ENDOSCOPY;  Service: Cardiovascular;  Laterality: N/A;    Social History   Tobacco Use   Smoking status: Every Day    Current packs/day: 0.50    Average packs/day: 0.5 packs/day for 44.0 years (22.0 ttl pk-yrs)    Types: Cigarettes   Smokeless tobacco: Never  Substance Use Topics   Alcohol use: Yes    Alcohol/week: 0.0 standard drinks of alcohol    Comment: 08/13/2014 "I haven't drank in a month or so; if I drink it would be beer on a warm day"     Family history: -reviewed and not pertinent to toes necrosis   Physical Exam:  General: no acute distress, appears stated age Neurologic: alert, answering questions appropriately, following commands Cardiovascular: regular rate, no cyanosis Respiratory: unlabored breathing on room air, symmetric chest rise Psychiatric: appropriate affect, normal cadence to speech  MSK:   -Left foot exam  Necrosis seen of the fourth and fifth toes extending into the metatarsal region  Can dorsiflex and extend toes Sensation intact to light touch in sural, saphenous, tibial, deep peroneal, and superficial peroneal nerve distributions Cap refill >2s   Patient name: Jason Spencer Patient MRN: [id number] Date: 10/22/23

## 2023-10-22 NOTE — Progress Notes (Signed)
 PHARMACY - ANTICOAGULATION CONSULT NOTE  Pharmacy Consult for Heparin when INR </= 2.0 (warfarin on hold) Indication: atrial fibrillation and limb ischemia  Allergies  Allergen Reactions   Shellfish Allergy Nausea And Vomiting and Other (See Comments)    Shrimp, especially    Patient Measurements: Height: 5\' 10"  (177.8 cm) Weight: 86.2 kg (190 lb) IBW/kg (Calculated) : 73 HEPARIN DW (KG): 86.2  Vital Signs: Temp: 99 F (37.2 C) (04/14 0750) Temp Source: Oral (04/14 0750) BP: 119/73 (04/14 0750) Pulse Rate: 85 (04/14 0813)  Labs: Recent Labs    10/21/23 1327 10/21/23 1424 10/22/23 0346  HGB 12.2*  --  11.9*  HCT 38.7*  --  37.2*  PLT 432*  --  385  LABPROT  --  35.2* 38.3*  INR  --  3.5* 3.9*  CREATININE 1.04  --  1.19    Estimated Creatinine Clearance: 59.6 mL/min (by C-G formula based on SCr of 1.19 mg/dL).  Assessment: 64 yom with a history of HF, AF on warfarin, CAD, COPD, PAD . Patient presenting with toe pain, reports 5th and 4th digit on his left foot gradually started becoming black proximately 2 weeks ago. Heparin per pharmacy consult placed for atrial fibrillation and limb ischemia .  Holding warfarin.  INR 3.5 on 4/13 and 3.9 today.  Last warfarin dose 4/13 prior to admission. 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  Ortho following for possible debridement or amputation.  Goal of Therapy:  Heparin level 0.3-0.7 units/ml Monitor platelets by anticoagulation protocol: Yes   Plan:  No heparin yet; begin when INR </= 2.0 Daily PT/INR. Warfarin on hold.  Adolphus Akin, RPh 10/22/2023,9:18 AM

## 2023-10-22 NOTE — Progress Notes (Signed)
 Transition of Care [facility]) - Inpatient Brief Assessment   Patient Details  Name: Jason Spencer MRN: [id number] Date of Birth: Oct 16, 1950  Transition of [facility]) CM/SW Contact:    Jannine Meo, RN Phone Number: 10/22/2023, 1:16 PM   Clinical Narrative: Patient from home, lives with sister (who is currently admitted to [facility]). Patient c/o increased pan to left toes. PT eval done, awaiting ortho decision of toe amputation before making discharge recommendations. TOC to follow for discharge needs.   Transition of Care Asessment: Insurance and Status: (P) Insurance coverage has been reviewed Patient has primary care physician: (P) Yes Home environment has been reviewed: (P) home with sister (sister currently admitted to [facility]) Prior level of function:: (P) independent Prior/Current Home Services: (P) No current home services Social Drivers of Health Review: (P) SDOH reviewed no interventions necessary Readmission risk has been reviewed: (P) Yes Transition of care needs: (P) transition of care needs identified, TOC will continue to follow

## 2023-10-22 NOTE — Progress Notes (Signed)
 VASCULAR LAB    ABI has been performed.  See CV proc for preliminary results.   Andera Cranmer, RVT 10/22/2023, 4:48 PM

## 2023-10-22 NOTE — Plan of Care (Signed)
  Problem: Education: Goal: Knowledge of General Education information will improve Description: Including pain rating scale, medication(s)/side effects and non-pharmacologic comfort measures Outcome: Progressing   Problem: Health Behavior/Discharge Planning: Goal: Ability to manage health-related needs will improve Outcome: Progressing   Problem: Clinical Measurements: Goal: Ability to maintain clinical measurements within normal limits will improve Outcome: Progressing   Problem: Activity: Goal: Risk for activity intolerance will decrease Outcome: Progressing   Problem: Nutrition: Goal: Adequate nutrition will be maintained Outcome: Progressing   Problem: Pain Managment: Goal: General experience of comfort will improve and/or be controlled Outcome: Progressing   Problem: Safety: Goal: Ability to remain free from injury will improve Outcome: Progressing   Problem: Skin Integrity: Goal: Risk for impaired skin integrity will decrease Outcome: Progressing

## 2023-10-23 ENCOUNTER — Inpatient Hospital Stay (HOSPITAL_COMMUNITY)

## 2023-10-23 DIAGNOSIS — M86172 Other acute osteomyelitis, left ankle and foot: Secondary | ICD-10-CM

## 2023-10-23 DIAGNOSIS — I739 Peripheral vascular disease, unspecified: Secondary | ICD-10-CM | POA: Diagnosis not present

## 2023-10-23 DIAGNOSIS — M869 Osteomyelitis, unspecified: Secondary | ICD-10-CM | POA: Diagnosis not present

## 2023-10-23 DIAGNOSIS — I509 Heart failure, unspecified: Secondary | ICD-10-CM

## 2023-10-23 LAB — ECHOCARDIOGRAM COMPLETE
AR max vel: 2.03 cm2
AV Area VTI: 2.05 cm2
AV Area mean vel: 2.02 cm2
AV Mean grad: 4.7 mmHg
AV Peak grad: 8.9 mmHg
Ao pk vel: 1.49 m/s
Area-P 1/2: 2.91 cm2
Calc EF: 70.1 %
Height: 70 in
S' Lateral: 3.2 cm
Single Plane A2C EF: 71.3 %
Single Plane A4C EF: 67.1 %
Weight: 2966.51 [oz_av]

## 2023-10-23 LAB — CBC WITH DIFFERENTIAL/PLATELET
Abs Immature Granulocytes: 0.17 10*3/uL — ABNORMAL HIGH (ref 0.00–0.07)
Basophils Absolute: 0 10*3/uL (ref 0.0–0.1)
Basophils Relative: 0 %
Eosinophils Absolute: 0.1 10*3/uL (ref 0.0–0.5)
Eosinophils Relative: 1 %
HCT: 35.1 % — ABNORMAL LOW (ref 39.0–52.0)
Hemoglobin: 11.4 g/dL — ABNORMAL LOW (ref 13.0–17.0)
Immature Granulocytes: 1 %
Lymphocytes Relative: 11 %
Lymphs Abs: 1.4 10*3/uL (ref 0.7–4.0)
MCH: 31.2 pg (ref 26.0–34.0)
MCHC: 32.5 g/dL (ref 30.0–36.0)
MCV: 96.2 fL (ref 80.0–100.0)
Monocytes Absolute: 1.3 10*3/uL — ABNORMAL HIGH (ref 0.1–1.0)
Monocytes Relative: 10 %
Neutro Abs: 9.2 10*3/uL — ABNORMAL HIGH (ref 1.7–7.7)
Neutrophils Relative %: 77 %
Platelets: 372 10*3/uL (ref 150–400)
RBC: 3.65 MIL/uL — ABNORMAL LOW (ref 4.22–5.81)
RDW: 12.2 % (ref 11.5–15.5)
WBC: 12.1 10*3/uL — ABNORMAL HIGH (ref 4.0–10.5)
nRBC: 0 % (ref 0.0–0.2)

## 2023-10-23 LAB — BASIC METABOLIC PANEL WITH GFR
Anion gap: 10 (ref 5–15)
BUN: 7 mg/dL — ABNORMAL LOW (ref 8–23)
CO2: 23 mmol/L (ref 22–32)
Calcium: 8.3 mg/dL — ABNORMAL LOW (ref 8.9–10.3)
Chloride: 104 mmol/L (ref 98–111)
Creatinine, Ser: 1.27 mg/dL — ABNORMAL HIGH (ref 0.61–1.24)
GFR, Estimated: >60 mL/min (ref >60)
Glucose, Bld: 89 mg/dL (ref 70–99)
Potassium: 3.8 mmol/L (ref 3.5–5.1)
Sodium: 137 mmol/L (ref 135–145)

## 2023-10-23 LAB — PROTIME-INR
INR: 2.9 — ABNORMAL HIGH (ref 0.8–1.2)
Prothrombin Time: 30.8 s — ABNORMAL HIGH (ref 11.4–15.2)

## 2023-10-23 LAB — MAGNESIUM: Magnesium: 2 mg/dL (ref 1.7–2.4)

## 2023-10-23 LAB — BRAIN NATRIURETIC PEPTIDE: B Natriuretic Peptide: 283.3 pg/mL — ABNORMAL HIGH (ref 0.0–100.0)

## 2023-10-23 LAB — ABO/RH: ABO/RH(D): O POS

## 2023-10-23 LAB — C-REACTIVE PROTEIN: CRP: 9.9 mg/dL — ABNORMAL HIGH (ref <1.0)

## 2023-10-23 LAB — PROCALCITONIN: Procalcitonin: <0.1 ng/mL

## 2023-10-23 MED ORDER — LACTATED RINGERS IV SOLN: INTRAVENOUS | Status: AC

## 2023-10-23 NOTE — Consult Note (Signed)
 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  HPI: Jason Spencer is a 73 y.o. 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  Patient has a history of tobacco use.  He has a history of saphenous vein ablation with laser in 2019.  He has a history of coronary artery disease.  Past Medical History:  Diagnosis Date   Acute CHF (HCC)    [name] 08/13/2014   Atrial fibrillation with RVR (HCC) 08/13/2014   new onset/notes 08/13/2014   CAP (community acquired pneumonia) 07/2014   Cardiomyopathy (HCC)    Migraine 1970    Past Surgical History:  Procedure Laterality Date   CARDIOVERSION N/A 08/19/2014   Procedure: CARDIOVERSION;  Surgeon: Laurey Morale, MD;  Location: [facility] ENDOSCOPY;  Service: Cardiovascular;  Laterality: N/A;   ENDOVENOUS ABLATION SAPHENOUS VEIN W/ LASER Right 05/01/2018   endovenous laser ablation right greater saphenous vein by [name] MD    LEFT AND RIGHT HEART CATHETERIZATION WITH CORONARY ANGIOGRAM N/A 08/17/2014   Procedure: LEFT AND RIGHT HEART CATHETERIZATION WITH CORONARY ANGIOGRAM;  Surgeon: Lesleigh Noe, MD;  Location: [facility] CATH LAB;  Service: Cardiovascular;  Laterality: N/A;   NO PAST SURGERIES     PERCUTANEOUS CORONARY STENT INTERVENTION (PCI-S)  08/17/2014   Procedure: PERCUTANEOUS CORONARY STENT INTERVENTION (PCI-S);  Surgeon: Lesleigh Noe, MD;  Location: [facility] CATH LAB;  Service: Cardiovascular;;   TEE WITHOUT CARDIOVERSION N/A 08/19/2014   Procedure: TRANSESOPHAGEAL ECHOCARDIOGRAM (TEE);  Surgeon: Laurey Morale, MD;  Location: [facility] ENDOSCOPY;  Service: Cardiovascular;  Laterality: N/A;   Social History   Socioeconomic History   Marital status: Single    Spouse name: Not on file   Number of children: Not on file   Years of education: Not on file   Highest education level: Not on file  Occupational History   Not on file  Tobacco Use   Smoking  status: Every Day    Current packs/day: 0.50    Average packs/day: 0.5 packs/day for 44.0 years (22.0 ttl pk-yrs)    Types: Cigarettes   Smokeless tobacco: Never  Vaping Use   Vaping status: Never Used  Substance and Sexual Activity   Alcohol use: Yes    Alcohol/week: 0.0 standard drinks of alcohol    Comment: 08/13/2014 "I haven't drank in a month or so; if I drink it would be beer on a warm day"   Drug use: No   Sexual activity: Not Currently  Other Topics Concern   Not on file  Social History Narrative   Not on file   Social Drivers of Health   Financial Resource Strain: Low Risk  (01/08/2023)   Overall Financial Resource Strain (CARDIA)    Difficulty of Paying Living Expenses: Not hard at all  Food Insecurity: No Food Insecurity (10/21/2023)   Hunger Vital Sign    Worried About Running Out of Food in the Last Year: Never true    Ran Out of Food in the Last Year: Never true  Transportation Needs: No Transportation Needs (10/21/2023)   PRAPARE - Administrator, Civil Service (Medical): No    Lack of Transportation (Non-Medical): No  Physical Activity: Insufficiently Active (01/08/2023)   Exercise Vital Sign    Days of Exercise per Week: 2 days    Minutes of Exercise per Session: 20 min  Stress: Not on file  Social Connections: Moderately  Integrated (10/22/2023)   Social Connection and Isolation Panel [NHANES]    Frequency of Communication with Friends and Family: Three times a week    Frequency of Social Gatherings with Friends and Family: Three times a week    Attends Religious Services: 1 to 4 times per year    Active Member of Clubs or Organizations: No    Attends Engineer, structural: 1 to 4 times per year    Marital Status: Never married   Family History  Problem Relation Age of Onset   Heart disease Mother        VALUE REPLACEMENT   COPD Father    Aneurysm Father    - negative except otherwise stated in the family history section Allergies   Allergen Reactions   Shellfish Allergy Nausea And Vomiting and Other (See Comments)    Shrimp, especially   Prior to Admission medications   Medication Sig Start Date End Date Taking? Authorizing Provider  amLODipine (NORVASC) 5 MG tablet TAKE ONE TABLET ONCE DAILY 09/26/23  Yes Hawks, Christy A, FNP  Budeson-Glycopyrrol-Formoterol (BREZTRI AEROSPHERE) 160-9-4.8 MCG/ACT AERO Inhale 2 puffs into the lungs 2 (two) times daily. Patient taking differently: Inhale 1 puff into the lungs daily. 09/15/22  Yes Hawks, Christy A, FNP  lisinopril (ZESTRIL) 40 MG tablet TAKE ONE TABLET ONCE DAILY 09/26/23  Yes Hawks, [name], FNP  metoprolol succinate (TOPROL-XL) 100 MG 24 hr tablet TAKE 1 TABLET WITH OR IMMEDIATELY FOLLOWING A MEAL 09/26/23  Yes Hawks, Christy A, FNP  VASELINE PURE ULTRA WHITE ointment Apply 1 application  topically as needed (for psoriasis- affected areas).   Yes [provider]  warfarin (COUMADIN) 5 MG tablet TAKE ONE TABLET ON MONDAY, WEDNESDAY, AND FRIDAY. TAKE 1 AND 1/2 TABLETS ON ALL other DAYS Patient taking differently: Take 5-7.5 mg by mouth See admin instructions. Take 7.5 mg by mouth in the morning on Sun/Tues/Thurs/Sat and 5 mg on Mon/Wed/Fri 09/21/23  Yes Hawks, Christy A, FNP  lidocaine 4 % Place 2 patches onto the skin daily. Patient not taking: Reported on 10/21/2023 07/06/23   Arrie Senate, FNP  rosuvastatin (CRESTOR) 5 MG tablet Take 1 tablet (5 mg total) by mouth daily. Patient not taking: Reported on 10/21/2023 09/15/22   [name], FNP  triamcinolone ointment (KENALOG) 0.5 % Apply 1 Application topically 2 (two) times daily. Patient not taking: Reported on 10/21/2023 05/10/23   [name], FNP   VAS [place] ABI WITH/WO TBI Result Date: 10/22/2023  LOWER EXTREMITY DOPPLER STUDY Patient Name:  Jason Spencer  Date of Exam:   10/22/2023 Medical Rec #: [id number]       Accession #:    [id number] Date of Birth: [birth year]      Patient Gender: M Patient  Age:   63 years Exam Location:  [facility] Procedure:      VAS [place] ABI WITH/WO TBI Referring Phys: Lyn Hollingshead MELVIN --------------------------------------------------------------------------------  Indications: Rest pain, and gangrene. Osteomyelitis High Risk Factors: Hypertension, hyperlipidemia, current smoker, coronary artery                    disease. Other Factors: Atrial flutter.  Vascular Interventions: History of right GSV ablation 2019. Limitations: Today's exam was limited due to Skin texture/Psoraisis, involuntary              patient movement. Comparison Study: No prior study Performing Technologist: Sherren Kerns RVS  Examination Guidelines: A complete evaluation includes at minimum, Doppler waveform signals and  systolic blood pressure reading at the level of bilateral brachial, anterior tibial, and posterior tibial arteries, when vessel segments are accessible. Bilateral testing is considered an integral part of a complete examination. Photoelectric Plethysmograph (PPG) waveforms and toe systolic pressure readings are included as required and additional duplex testing as needed. Limited examinations for reoccurring indications may be performed as noted.  ABI Findings: +---------+-----------------+-----+-----------------+-------------------------+ Right    Rt Pressure      IndexWaveform         Comment                            (mmHg)                                                           +---------+-----------------+-----+-----------------+-------------------------+ Brachial 126                   triphasic                                  +---------+-----------------+-----+-----------------+-------------------------+ PTA      254              2.02 dampened                                                                  monophasic                                 +---------+-----------------+-----+-----------------+-------------------------+ DP       80                0.63                  audibly dampened                                                          monophasic                +---------+-----------------+-----+-----------------+-------------------------+ Great Toe29               0.23 Abnormal                                   +---------+-----------------+-----+-----------------+-------------------------+ +---------+------------------+-----+-------------------+-------+ Left     Lt Pressure (mmHg)IndexWaveform           Comment +---------+------------------+-----+-------------------+-------+ Brachial 124                    triphasic                  +---------+------------------+-----+-------------------+-------+ PTA      80  0.63 dampened monophasic        +---------+------------------+-----+-------------------+-------+ DP       254               2.02 dampened monophasic        +---------+------------------+-----+-------------------+-------+ Great Toe0                 0.00 Absent                     +---------+------------------+-----+-------------------+-------+ +-------+---------------------+-----------+------------+------------+ ABI/TBIToday's ABI          Today's TBIPrevious ABIPrevious TBI +-------+---------------------+-----------+------------+------------+ Right  2.02 non compressible0.23                                +-------+---------------------+-----------+------------+------------+ Left   2.02 non compressibleabsent                              +-------+---------------------+-----------+------------+------------+ Arterial wall calcification precludes accurate ankle pressures and ABIs.  Summary: Right: Resting right ankle-brachial index indicates noncompressible right lower extremity arteries. The right toe-brachial index is abnormal. Left: Resting left ankle-brachial index indicates noncompressible left lower extremity arteries. The left toe-brachial index is abnormal.  *See table(s) above for measurements and observations.  Electronically signed by Jimmye Moulds MD on 10/22/2023 at 6:35:14 PM.    Final    DG Chest Port 1 View Result Date: 10/22/2023 CLINICAL DATA:  Shortness of breath EXAM: PORTABLE CHEST 1 VIEW COMPARISON:  08/19/2014 FINDINGS: Chronic extrapleural opacity in the right upper chest with probable pleural calcification, this was loculated pleural fluid by 2016 chest CT. The left lung is clear. Normal heart size and mediastinal contours. Artifact from EKG leads. 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 No acute superimposed finding. Electronically Signed   By: Ronnette Coke M.D.   On: 10/22/2023 06:53   CT ANGIO AO+BIFEM W & OR WO CONTRAST Result Date: 10/21/2023 CLINICAL DATA:  Soft tissue infection suspected, left lower leg. EXAM: CT ANGIOGRAPHY OF ABDOMINAL AORTA WITH ILIOFEMORAL RUNOFF TECHNIQUE: Multidetector CT imaging of the abdomen, pelvis and lower extremities was performed using the standard protocol during bolus administration of intravenous contrast. Multiplanar CT image reconstructions and MIPs were obtained to evaluate the vascular anatomy. RADIATION DOSE REDUCTION: This exam was performed according to the departmental dose-optimization program which includes automated exposure control, adjustment of the mA and/or kV according to patient size and/or use of iterative reconstruction technique. CONTRAST:  115mL OMNIPAQUE IOHEXOL 350 MG/ML SOLN COMPARISON:  None Available. FINDINGS: VASCULAR Aorta: Normal caliber aorta without aneurysm, dissection, vasculitis or significant stenosis. Aortic atherosclerosis. Celiac: Excluded from the field of view. SMA: Excluded from the field of view. Renals: Excluded from the field of view. IMA: Patent. RIGHT Lower Extremity Inflow: There is heavy atherosclerotic calcification of the common iliac arteries bilaterally with severe  stenosis bilaterally. No dissection is seen. The external iliac arteries are patent with moderate stenosis on the right. The internal iliac artery is not well evaluated due to heavy calcification. Outflow: Heavy atherosclerotic calcification with multifocal severe stenosis involving in the superficial femoral and popliteal arteries. The common femoral and profunda femoral arteries appear patent Runoff: Heavy atherosclerotic calcification. The distal peroneal, anterior tibial, and posterior tibial arteries appear patent at the level of the ankle, however there is likely severe high-grade stenosis. LEFT Lower Extremity Inflow: Heavy  atherosclerotic calcification. There is severe stenosis involving the common and internal iliac arteries. The internal iliac artery is not well evaluated due to heavy calcification. Outflow: Heavy atherosclerotic calcification with multifocal severe stenosis involving the superficial femoral and popliteal artery. Runoff: Heavy atherosclerotic calcification. The distal calf vessels at the ankle appear patent, however there is likely multifocal high-grade stenosis. Veins: No obvious venous abnormality within the limitations of this arterial phase study. Review of the MIP images confirms the above findings. NON-VASCULAR Lower chest: Excluded from the field of view. Hepatobiliary: Excluded from the field of view. Pancreas: Excluded from the field of view. Spleen: Excluded from the field of view. Adrenals/Urinary Tract: The adrenal glands are not visualized. Hypodensities are noted in the lower pole of the left kidney which are too small to further characterize. The bladder is unremarkable. Stomach/Bowel: Colonic diverticulosis without diverticulitis. No acute abnormality is seen. Lymphatic: No lymphadenopathy by size criteria. Reproductive: Prostate is unremarkable. Other: No abdominopelvic ascites. Musculoskeletal: Degenerative changes are present in the lower lumbar spine. No acute fracture  or dislocation. Degenerative changes are noted at the knees bilaterally. Air is identified in the soft tissues of the left foot about the fourth and fifth digits and distal metatarsals. Subcutaneous edema is noted about the distal left lower extremity and foot. No obvious abscess is seen IMPRESSION: VASCULAR 1. Severe multifocal high-grade stenosis involving the bilateral common femoral, superficial femoral, popliteal, and anterior tibial, posterior tibial and peroneal arteries bilaterally. 2. Aortic atherosclerosis. NON-VASCULAR 1. Subcutaneous edema with subcutaneous gas involving in the lateral aspect of the left foot. No obvious abscess is seen. Evaluation for osteomyelitis is limited due to field of view. 2. Colonic diverticulosis. Electronically Signed   By: Thornell Sartorius M.D.   On: 10/21/2023 16:55   DG Foot Complete Left Result Date: 10/21/2023 CLINICAL DATA:  Black fifth toe EXAM: LEFT FOOT - COMPLETE 3+ VIEW COMPARISON:  None Available. FINDINGS: Hallux valgus deformity. Subcutaneous gas about the head of the fifth metatarsal and proximal phalanx left fifth toe. Cortical loss medially from the fifth metatarsal head. No fracture or dislocation. Mild diffuse osteopenia. Calcaneal spur. Extensive arterial calcifications. IMPRESSION: Subcutaneous gas about the head of the fifth metatarsal and proximal phalanx left fifth toe, with cortical loss medially from the fifth metatarsal head, consistent with osteomyelitis. Electronically Signed   By: Corlis Leak M.D.   On: 10/21/2023 14:11   - pertinent xrays, CT, MRI studies were reviewed and independently interpreted  Positive ROS: All other systems have been reviewed and were otherwise negative with the exception of those mentioned in the HPI and as above.  Physical Exam: General: Alert, no acute distress Psychiatric: Patient is competent for consent with normal mood and affect Lymphatic: No axillary or cervical lymphadenopathy Cardiovascular: No pedal  edema Respiratory: No cyanosis, no use of accessory musculature GI: No organomegaly, abdomen is soft and non-tender    Images:  @ENCIMAGES @  Labs:  Lab Results  Component Value Date   HGBA1C 5.5 04/27/2016   HGBA1C 6.7 (H) 08/19/2014   CRP 9.9 (H) 10/23/2023   CRP 9.9 (H) 10/22/2023   CRP 9.0 (H) 10/21/2023   REPTSTATUS PENDING 10/21/2023   GRAMSTAIN  08/14/2014    RARE WBC PRESENT,BOTH PMN AND MONONUCLEAR NO ORGANISMS SEEN Performed at [facility]    CULT  10/21/2023    NO GROWTH < 24 HOURS Performed at [facility], [address] [place], [place] [address]     Lab Results  Component Value  Date   ALBUMIN 2.2 (L) 10/22/2023   ALBUMIN 2.4 (L) 10/21/2023   ALBUMIN 3.7 (L) 09/06/2023        Latest Ref Rng & Units 10/23/2023    4:02 AM 10/22/2023    3:46 AM 10/21/2023    1:27 PM  CBC EXTENDED  WBC 4.0 - 10.5 K/uL 12.1  11.2  15.7   RBC 4.22 - 5.81 MIL/uL 3.65  3.86  3.94   Hemoglobin 13.0 - 17.0 g/dL 14.7  82.9  56.2   HCT 39.0 - 52.0 % 35.1  37.2  38.7   Platelets 150 - 400 K/uL 372  385  432   NEUT# 1.7 - 7.7 K/uL 9.2   12.5   Lymph# 0.7 - 4.0 K/uL 1.4   1.3     Neurologic: Patient does not have protective sensation bilateral lower extremities.   MUSCULOSKELETAL:   Skin: Examination patient has thin atrophic skin to both lower extremities.  He does not have a palpable pulse.  Patient has dry gangrenous changes of the 4th and 5th toes with gangrene extending proximal to the 4th and 5th metatarsal heads.  Review of the radiographs shows calcification of the arteries out to the forefoot.  Ankle-brachial indices shows noncompressible vessels at the ankle.  Assessment: Assessment: Severe peripheral vascular disease with dry gangrene of the 4th and 5th toes with gangrene extending proximal to the metatarsal heads on the plantar aspect of his foot.  Plan: Plan: Patient is scheduled for endovascular intervention.  Will reevaluate to see if foot  salvage intervention is an option after endovascular intervention.  With the extent of the gangrenous changes up to the metatarsal heads, foot salvage intervention is going to be difficult.  Thank you for the consult and the opportunity to see Mr. Gaither Juba, MD [facility] [phone] 8:29 AM

## 2023-10-23 NOTE — Progress Notes (Addendum)
 PROGRESS NOTE                                                                                                                                                                                                             Patient Demographics:    Jason Spencer, is a 72 y.o. male, DOB - [birth year], QIO:[id number]  Outpatient Primary MD for the patient is [name], FNP    LOS - 2  Admit date - 10/21/2023    Chief Complaint  Patient presents with   Circulatory Problem       Brief Narrative (HPI from H&P)   73 y.o. male with medical history significant of hypertension, hyperlipidemia, atrial fibrillation/flutter, CAD, COPD, QT prolongation, dCHF, alcohol use, anxiety, pleural effusion presenting with worsening toe pain.   Patient has had some worsening toe pain and discoloration for the past 2 weeks.  He has noticed some gradual worsening pain and discoloration to the point where the toes are now turned black on the left foot.  Changes are at his 4th and 5th digit.  Has not become difficult to walk. He lives with his sister but she has been in the hospital last couple of weeks and someone came to check on him yesterday and recommended he be seen in the ED.   Subjective:   Patient in bed, appears comfortable, denies any headache, no fever, no chest pain or pressure, no shortness of breath , no abdominal pain. No focal weakness.  Mild L foot pain   Assessment  & Plan :   Severe PAD with ongoing smoking, here with critical left foot gangrene and osteomyelitis present on admission. He is on Coumadin, INR in therapeutic range,  continue statin and beta-blocker at home dose, strictly counseled to quit smoking.  Continue empiric antibiotics which are vancomycin and Zosyn, orthopedics and vascular surgery on board, vascular surgery planning for an angiogram planned for 10/23/2023 now postponed by a day or 2 due to scheduling conflict.   Baby aspirin after procedures are done, will likely require at the very minimum left transmetatarsal amputation if not more.  Follow cultures and monitor.  Chronic atrial fibrillation.  Continue home dose beta-blocker, on Coumadin, INR supratherapeutic Coumadin on hold  Lab Results  Component Value Date   INR 2.9 (H) 10/23/2023   INR 3.9 (H) 10/22/2023  INR 3.5 (H) 10/21/2023    CKD 3A.  Baseline creatinine close to 1.3.  Monitor.    CAD.  Stable no acute issues, no rest chest pain, on beta-blocker and statin continue, Coumadin as above, strictly counseled to quit smoking, will check baseline echocardiogram to evaluate EF and wall motion.  History of smoking and alcohol abuse.  Strictly counseled to quit both, CIWA protocol.  Dyslipidemia.  On statin.  Hypertension.  Continue combination of beta-blocker and Norvasc.  Monitor.       Condition - Extremely Guarded  Family Communication  :  none present  Code Status :  Full  Consults  :  Ortho, VVS  PUD Prophylaxis :    Procedures  :     TTE -   CTA -   VASCULAR 1. Severe multifocal high-grade stenosis involving the bilateral common femoral, superficial femoral, popliteal, and anterior tibial, posterior tibial and peroneal arteries bilaterally. 2. Aortic atherosclerosis.   NON-VASCULAR 1. Subcutaneous edema with subcutaneous gas involving in the lateral aspect of the left foot. No obvious abscess is seen. Evaluation for osteomyelitis is limited due to field of view. 2. Colonic diverticulosis      Disposition Plan  :    Status is: Inpatient   DVT Prophylaxis  :  heparin    Lab Results  Component Value Date   PLT 372 10/23/2023    Diet :  Diet Order             Diet regular Room service appropriate? Yes; Fluid consistency: Thin  Diet effective now                    Inpatient Medications  Scheduled Meds:  [START ON 10/24/2023] amLODipine  5 mg Oral Daily   [START ON 10/24/2023] aspirin  81 mg Oral  Daily   budeson-glycopyrrolate-formoterol  2 puff Inhalation BID   linezolid  600 mg Oral Q12H   metoprolol succinate  100 mg Oral Daily   rosuvastatin  5 mg Oral Daily   sodium chloride flush  3 mL Intravenous Q12H   Continuous Infusions:  lactated ringers 75 mL/hr at 10/23/23 0624   piperacillin-tazobactam 3.375 g (10/23/23 0521)   PRN Meds:.acetaminophen **OR** acetaminophen, HYDROcodone-acetaminophen, polyethylene glycol    Objective:   Vitals:   10/22/23 2124 10/22/23 2345 10/23/23 0441 10/23/23 0828  BP: (!) 104/57 119/69 (!) 117/55   Pulse: 87     Resp: 19 19 18    Temp: 97.9 F (36.6 C) 97.7 F (36.5 C) 97.9 F (36.6 C) 98 F (36.7 C)  TempSrc: Oral Oral Oral Oral  SpO2: 94%     Weight:  84.1 kg    Height:        Wt Readings from Last 3 Encounters:  10/22/23 84.1 kg  09/21/23 87.2 kg  09/06/23 89.3 kg     Intake/Output Summary (Last 24 hours) at 10/23/2023 0921 Last data filed at 10/23/2023 0443 Gross per 24 hour  Intake 3 ml  Output 100 ml  Net -97 ml     Physical Exam  Awake Alert, No new F.N deficits, Normal affect .AT,PERRAL Supple Neck, No JVD,   Symmetrical Chest wall movement, Good air movement bilaterally, CTAB RRR,No Gallops,Rubs or new Murmurs,  +ve B.Sounds, Abd Soft, No tenderness,   Left foot picture below from 10/22/2023, both lower extremities have signs of chronic ischemia, feebly felt pulses        Data Review:    Recent Labs  Lab 10/21/23  1327 10/22/23 0346 10/23/23 0402  WBC 15.7* 11.2* 12.1*  HGB 12.2* 11.9* 11.4*  HCT 38.7* 37.2* 35.1*  PLT 432* 385 372  MCV 98.2 96.4 96.2  MCH 31.0 30.8 31.2  MCHC 31.5 32.0 32.5  RDW 12.0 12.2 12.2  LYMPHSABS 1.3  --  1.4  MONOABS 1.5*  --  1.3*  EOSABS 0.1  --  0.1  BASOSABS 0.1  --  0.0    Recent Labs  Lab 10/21/23 1327 10/21/23 1341 10/21/23 1424 10/21/23 1600 10/22/23 0346 10/22/23 0857 10/23/23 0402  NA 135  --   --   --  138  --  137  K 4.2  --   --   --  3.8   --  3.8  CL 100  --   --   --  100  --  104  CO2 23  --   --   --  25  --  23  ANIONGAP 12  --   --   --  13  --  10  GLUCOSE 114*  --   --   --  89  --  89  BUN 9  --   --   --  8  --  7*  CREATININE 1.04  --   --   --  1.19  --  1.27*  AST 19  --   --   --  20  --   --   ALT 10  --   --   --  12  --   --   ALKPHOS 51  --   --   --  49  --   --   BILITOT 0.4  --   --   --  0.6  --   --   ALBUMIN 2.4*  --   --   --  2.2*  --   --   CRP  --   --   --  9.0*  --  9.9* 9.9*  PROCALCITON  --   --   --   --   --  <0.10 <0.10  LATICACIDVEN  --  1.6  --   --   --   --   --   INR  --   --  3.5*  --  3.9*  --  2.9*  BNP  --   --   --   --   --   --  283.3*  MG  --   --   --   --   --   --  2.0  CALCIUM 8.4*  --   --   --  8.7*  --  8.3*      Recent Labs  Lab 10/21/23 1327 10/21/23 1341 10/21/23 1424 10/21/23 1600 10/22/23 0346 10/22/23 0857 10/23/23 0402  CRP  --   --   --  9.0*  --  9.9* 9.9*  PROCALCITON  --   --   --   --   --  <0.10 <0.10  LATICACIDVEN  --  1.6  --   --   --   --   --   INR  --   --  3.5*  --  3.9*  --  2.9*  BNP  --   --   --   --   --   --  283.3*  MG  --   --   --   --   --   --  2.0  CALCIUM  8.4*  --   --   --  8.7*  --  8.3*    --------------------------------------------------------------------------------------------------------------- Lab Results  Component Value Date   CHOL 213 (H) 09/06/2023   HDL 40 09/06/2023   LDLCALC 123 (H) 09/06/2023   TRIG 281 (H) 09/06/2023   CHOLHDL 5.3 (H) 09/06/2023    Lab Results  Component Value Date   HGBA1C 5.5 04/27/2016   No results for input(s): "TSH", "T4TOTAL", "FREET4", "T3FREE", "THYROIDAB" in the last 72 hours. No results for input(s): "VITAMINB12", "FOLATE", "FERRITIN", "TIBC", "IRON", "RETICCTPCT" in the last 72 hours. ------------------------------------------------------------------------------------------------------------------ Cardiac Enzymes No results for input(s): "CKMB", "TROPONINI",  "MYOGLOBIN" in the last 168 hours.  Invalid input(s): "CK"  Micro Results Recent Results (from the past 240 hours)  Blood culture (routine x 2)     Status: None (Preliminary result)   Collection Time: 10/21/23  1:30 PM   Specimen: BLOOD LEFT ARM  Result Value Ref Range Status   Specimen Description BLOOD LEFT ARM  Final   Special Requests   Final    BOTTLES DRAWN AEROBIC AND ANAEROBIC Blood Culture results may not be optimal due to an inadequate volume of blood received in culture bottles   Culture   Final    NO GROWTH 2 DAYS Performed at [facility], [address] [place], [place] [address]    Report Status PENDING  Incomplete  Blood culture (routine x 2)     Status: None (Preliminary result)   Collection Time: 10/21/23  2:24 PM   Specimen: BLOOD  Result Value Ref Range Status   Specimen Description BLOOD RIGHT ANTECUBITAL  Final   Special Requests   Final    BOTTLES DRAWN AEROBIC AND ANAEROBIC Blood Culture adequate volume   Culture   Final    NO GROWTH 2 DAYS Performed at [facility], [address], [place], [place] [address]    Report Status PENDING  Incomplete  MRSA Next Gen by PCR, Nasal     Status: None   Collection Time: 10/21/23  9:15 PM   Specimen: Nasal Mucosa; Nasal Swab  Result Value Ref Range Status   MRSA by PCR Next Gen NOT DETECTED NOT DETECTED Final    Comment: (NOTE) The GeneXpert MRSA Assay (FDA approved for NASAL specimens only), is one component of a comprehensive MRSA colonization surveillance program. It is not intended to diagnose MRSA infection nor to guide or monitor treatment for MRSA infections. Test performance is not FDA approved in patients less than 14 years old. Performed at [facility], [address] [place], [place] [address]     Radiology Report VAS [place] ABI WITH/WO TBI Result Date: 10/22/2023  LOWER EXTREMITY DOPPLER STUDY Patient Name:  GEORGES VICTORIO  Date of Exam:   10/22/2023 Medical Rec #: [id number]        Accession #:    [id number] Date of Birth: [birth year]      Patient Gender: M Patient Age:   26 years Exam Location:  [facility] Procedure:      VAS [place] ABI WITH/WO TBI Referring Phys: Lyn Hollingshead MELVIN --------------------------------------------------------------------------------  Indications: Rest pain, and gangrene. Osteomyelitis High Risk Factors: Hypertension, hyperlipidemia, current smoker, coronary artery                    disease. Other Factors: Atrial flutter.  Vascular Interventions: History of right GSV ablation 2019. Limitations: Today's exam was limited due to Skin texture/Psoraisis, involuntary  patient movement. Comparison Study: No prior study Performing Technologist: Sherren Kerns RVS  Examination Guidelines: A complete evaluation includes at minimum, Doppler waveform signals and systolic blood pressure reading at the level of bilateral brachial, anterior tibial, and posterior tibial arteries, when vessel segments are accessible. Bilateral testing is considered an integral part of a complete examination. Photoelectric Plethysmograph (PPG) waveforms and toe systolic pressure readings are included as required and additional duplex testing as needed. Limited examinations for reoccurring indications may be performed as noted.  ABI Findings: +---------+-----------------+-----+-----------------+-------------------------+ Right    Rt Pressure      IndexWaveform         Comment                            (mmHg)                                                           +---------+-----------------+-----+-----------------+-------------------------+ Brachial 126                   triphasic                                  +---------+-----------------+-----+-----------------+-------------------------+ PTA      254              2.02 dampened                                                                  monophasic                                  +---------+-----------------+-----+-----------------+-------------------------+ DP       80               0.63                  audibly dampened                                                          monophasic                +---------+-----------------+-----+-----------------+-------------------------+ Great Toe29               0.23 Abnormal                                   +---------+-----------------+-----+-----------------+-------------------------+ +---------+------------------+-----+-------------------+-------+ Left     Lt Pressure (mmHg)IndexWaveform           Comment +---------+------------------+-----+-------------------+-------+ Brachial 124                    triphasic                  +---------+------------------+-----+-------------------+-------+  PTA      80                0.63 dampened monophasic        +---------+------------------+-----+-------------------+-------+ DP       254               2.02 dampened monophasic        +---------+------------------+-----+-------------------+-------+ Great Toe0                 0.00 Absent                     +---------+------------------+-----+-------------------+-------+ +-------+---------------------+-----------+------------+------------+ ABI/TBIToday's ABI          Today's TBIPrevious ABIPrevious TBI +-------+---------------------+-----------+------------+------------+ Right  2.02 non compressible0.23                                +-------+---------------------+-----------+------------+------------+ Left   2.02 non compressibleabsent                              +-------+---------------------+-----------+------------+------------+ Arterial wall calcification precludes accurate ankle pressures and ABIs.  Summary: Right: Resting right ankle-brachial index indicates noncompressible right lower extremity arteries. The right toe-brachial index is abnormal. Left: Resting left ankle-brachial index  indicates noncompressible left lower extremity arteries. The left toe-brachial index is abnormal. *See table(s) above for measurements and observations.  Electronically signed by Sherald Hess MD on 10/22/2023 at 6:35:14 PM.    Final    DG Chest Port 1 View Result Date: 10/22/2023 CLINICAL DATA:  Shortness of breath EXAM: PORTABLE CHEST 1 VIEW COMPARISON:  08/19/2014 FINDINGS: Chronic extrapleural opacity in the right upper chest with probable pleural calcification, this was loculated pleural fluid by 2016 chest CT. The left lung is clear. Normal heart size and mediastinal contours. Artifact from EKG leads. 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 No acute superimposed finding. Electronically Signed   By: Tiburcio Pea M.D.   On: 10/22/2023 06:53   CT ANGIO AO+BIFEM W & OR WO CONTRAST Result Date: 10/21/2023 CLINICAL DATA:  Soft tissue infection suspected, left lower leg. EXAM: CT ANGIOGRAPHY OF ABDOMINAL AORTA WITH ILIOFEMORAL RUNOFF TECHNIQUE: Multidetector CT imaging of the abdomen, pelvis and lower extremities was performed using the standard protocol during bolus administration of intravenous contrast. Multiplanar CT image reconstructions and MIPs were obtained to evaluate the vascular anatomy. RADIATION DOSE REDUCTION: This exam was performed according to the departmental dose-optimization program which includes automated exposure control, adjustment of the mA and/or kV according to patient size and/or use of iterative reconstruction technique. CONTRAST:  OMNIPAQUE IOHEXOL 350 MG/ML SOLN COMPARISON:  None Available. FINDINGS: VASCULAR Aorta: Normal caliber aorta without aneurysm, dissection, vasculitis or significant stenosis. Aortic atherosclerosis. Celiac: Excluded from the field of view. SMA: Excluded from the field of view. Renals: Excluded from the field of view. IMA: Patent. RIGHT Lower Extremity Inflow: There  is heavy atherosclerotic calcification of the common iliac arteries bilaterally with severe stenosis bilaterally. No dissection is seen. The external iliac arteries are patent with moderate stenosis on the right. The internal iliac artery is not well evaluated due to heavy calcification. Outflow: Heavy atherosclerotic calcification with multifocal severe stenosis involving in the superficial femoral and popliteal arteries. The common femoral and profunda femoral arteries appear patent Runoff: Heavy atherosclerotic calcification. The distal peroneal, anterior tibial, and posterior  tibial arteries appear patent at the level of the ankle, however there is likely severe high-grade stenosis. LEFT Lower Extremity Inflow: Heavy atherosclerotic calcification. There is severe stenosis involving the common and internal iliac arteries. The internal iliac artery is not well evaluated due to heavy calcification. Outflow: Heavy atherosclerotic calcification with multifocal severe stenosis involving the superficial femoral and popliteal artery. Runoff: Heavy atherosclerotic calcification. The distal calf vessels at the ankle appear patent, however there is likely multifocal high-grade stenosis. Veins: No obvious venous abnormality within the limitations of this arterial phase study. Review of the MIP images confirms the above findings. NON-VASCULAR Lower chest: Excluded from the field of view. Hepatobiliary: Excluded from the field of view. Pancreas: Excluded from the field of view. Spleen: Excluded from the field of view. Adrenals/Urinary Tract: The adrenal glands are not visualized. Hypodensities are noted in the lower pole of the left kidney which are too small to further characterize. The bladder is unremarkable. Stomach/Bowel: Colonic diverticulosis without diverticulitis. No acute abnormality is seen. Lymphatic: No lymphadenopathy by size criteria. Reproductive: Prostate is unremarkable. Other: No abdominopelvic ascites.  Musculoskeletal: Degenerative changes are present in the lower lumbar spine. No acute fracture or dislocation. Degenerative changes are noted at the knees bilaterally. Air is identified in the soft tissues of the left foot about the fourth and fifth digits and distal metatarsals. Subcutaneous edema is noted about the distal left lower extremity and foot. No obvious abscess is seen IMPRESSION: VASCULAR 1. Severe multifocal high-grade stenosis involving the bilateral common femoral, superficial femoral, popliteal, and anterior tibial, posterior tibial and peroneal arteries bilaterally. 2. Aortic atherosclerosis. NON-VASCULAR 1. Subcutaneous edema with subcutaneous gas involving in the lateral aspect of the left foot. No obvious abscess is seen. Evaluation for osteomyelitis is limited due to field of view. 2. Colonic diverticulosis. Electronically Signed   By: Wyvonnia Heimlich M.D.   On: 10/21/2023 16:55   DG Foot Complete Left Result Date: 10/21/2023 CLINICAL DATA:  Black fifth toe EXAM: LEFT FOOT - COMPLETE 3+ VIEW COMPARISON:  None Available. FINDINGS: Hallux valgus deformity. Subcutaneous gas about the head of the fifth metatarsal and proximal phalanx left fifth toe. Cortical loss medially from the fifth metatarsal head. No fracture or dislocation. Mild diffuse osteopenia. Calcaneal spur. Extensive arterial calcifications. IMPRESSION: Subcutaneous gas about the head of the fifth metatarsal and proximal phalanx left fifth toe, with cortical loss medially from the fifth metatarsal head, consistent with osteomyelitis. Electronically Signed   By: Nicoletta Barrier M.D.   On: 10/21/2023 14:11     Signature  -   Lynnwood Sauer M.D on 10/23/2023 at 9:21 AM   -  To page go to [email or web address]

## 2023-10-23 NOTE — Evaluation (Signed)
 Occupational Therapy Evaluation Patient Details Name: Jason Spencer MRN: [id number] DOB: September 05, 1950 Today's Date: 10/23/2023   History of Present Illness   73 y.o. male admitted 4/13  presenting with worsening left 4th and 5th toe pain. Osteomyelitis, amputation planned for 10/24/23.   PMH: hypertension, hyperlipidemia, atrial fibrillation/flutter, CAD, COPD, QT prolongation, dCHF, alcohol use, anxiety, pleural effusion     Clinical Impressions Pt c/o pain to LLE at rest, increases with weight bearing. Pt lives with sister who is currently in [facility] hospital with PNA for the last two weeks, PLOF independent. Pt currently close to baseline but limited due to LLE pain. Pt set up/superivsion sitting EOB for ADLs, able to transfer with step pivot using RW at [facility] for safety. Pt c/o pain and requested to return to bed, will reassess after surgery planned for 10/24/23 for DC and DME plan. Will continue to follow acutely.     If plan is discharge home, recommend the following:   A little help with walking and/or transfers;A little help with bathing/dressing/bathroom;Assistance with cooking/housework;Assist for transportation;Help with stairs or ramp for entrance     Functional Status Assessment   Patient has had a recent decline in their functional status and demonstrates the ability to make significant improvements in function in a reasonable and predictable amount of time.     Equipment Recommendations   Other (comment) (TBD after surgery 10/24/23)     Recommendations for Other Services         Precautions/Restrictions   Precautions Precautions: Fall Recall of Precautions/Restrictions: Intact Restrictions Weight Bearing Restrictions Per Provider Order: Yes LLE Weight Bearing Per Provider Order: Weight bearing as tolerated     Mobility Bed Mobility Overal bed mobility: Modified Independent             General bed mobility comments: no physical assist given, no LOB     Transfers Overall transfer level: Needs assistance Equipment used: Rolling walker (2 wheels) Transfers: Sit to/from Stand, Bed to chair/wheelchair/BSC Sit to Stand: Contact guard assist     Step pivot transfers: Contact guard assist     General transfer comment: CGA step pivot, no physical assist given      Balance Overall balance assessment: Needs assistance Sitting-balance support: No upper extremity supported, Feet supported Sitting balance-Leahy Scale: Good     Standing balance support: Single extremity supported, During functional activity Standing balance-Leahy Scale: Fair Standing balance comment: able to stand unaupported, does better with at least one hand supported.                           ADL either performed or assessed with clinical judgement   ADL Overall ADL's : Needs assistance/impaired Eating/Feeding: Independent   Grooming: Set up;Sitting   Upper Body Bathing: Set up;Sitting   Lower Body Bathing: Set up;Sitting/lateral leans   Upper Body Dressing : Set up;Sitting   Lower Body Dressing: Set up;Sitting/lateral leans;Sit to/from stand   Toilet Transfer: Contact guard assist;Rolling walker (2 wheels);BSC/3in1   Toileting- Clothing Manipulation and Hygiene: Set up;Sitting/lateral lean         General ADL Comments: Pt set up/supervisoin for ADLs, CGA for transfer to [facility], pain with weight bearing through LLE     Vision         Perception         Praxis         Pertinent Vitals/Pain Pain Assessment Pain Assessment: Faces Faces Pain Scale: Hurts even more Pain Location: left  toes Pain Descriptors / Indicators: Aching, Discomfort, Grimacing, Guarding Pain Intervention(s): Monitored during session     Extremity/Trunk Assessment Upper Extremity Assessment Upper Extremity Assessment: Overall WFL for tasks assessed   Lower Extremity Assessment Lower Extremity Assessment: Defer to PT evaluation       Communication  Communication Communication: No apparent difficulties   Cognition Arousal: Alert Behavior During Therapy: WFL for tasks assessed/performed                                 Following commands: Intact       Cueing  General Comments   Cueing Techniques: Verbal cues;Tactile cues      Exercises     Shoulder Instructions      Home Living Family/patient expects to be discharged to:: Private residence Living Arrangements: Other relatives (sister) Available Help at Discharge: Family;Available PRN/intermittently Type of Home: House Home Access: Stairs to enter [employer] of Steps: 1 Entrance Stairs-Rails: None Home Layout: One level;Multi-level Alternate Level Stairs-Number of Steps: 2 Alternate Level Stairs-Rails: Can reach both;Left;Right Bathroom Shower/Tub: Tub/shower unit;Sponge bathes at baseline   [employer]: Handicapped height         Additional Comments: Sister in [place] [facility] with PNA      Prior Functioning/Environment Prior Level of Function : Independent/Modified Independent             Mobility Comments: Pt reports holding onto furniture but not needing assist PTA ADLs Comments: Sponge bathed at baseline, dressed self.    OT Problem List: Decreased strength;Decreased activity tolerance;Impaired balance (sitting and/or standing);Pain   OT Treatment/Interventions: Self-care/ADL training;Therapeutic exercise;Energy conservation;DME and/or AE instruction;Splinting;Balance training;Patient/family education      OT Goals(Current goals can be found in the care plan section)   Acute Rehab OT Goals Patient Stated Goal: to manage pain to LLE OT Goal Formulation: With patient Time For Goal Achievement: 11/06/23 Potential to Achieve Goals: Good   OT Frequency:  Min 1X/week    Co-evaluation              AM-PAC OT "6 Clicks" Daily Activity     Outcome Measure Help from another person eating meals?: None Help from  another person taking care of personal grooming?: A Little Help from another person toileting, which includes using toliet, bedpan, or urinal?: A Little Help from another person bathing (including washing, rinsing, drying)?: A Little Help from another person to put on and taking off regular upper body clothing?: A Little Help from another person to put on and taking off regular lower body clothing?: A Little 6 Click Score: 19   End of Session Equipment Utilized During Treatment: Gait belt;Rolling walker (2 wheels) Nurse Communication: Mobility status  Activity Tolerance: Patient limited by pain Patient left: in bed;with call bell/phone within reach  OT Visit Diagnosis: Unsteadiness on feet (R26.81);Other abnormalities of gait and mobility (R26.89);Muscle weakness (generalized) (M62.81);Pain Pain - Right/Left: Left Pain - part of body: Ankle and joints of foot                Time: 1135-1148 OT Time Calculation (min): 13 min Charges:  OT General Charges $OT Visit: 1 Visit OT Evaluation $OT Eval Low Complexity: 1 Low  [address], OTR/L   Alexis Goodell 10/23/2023, 11:56 AM

## 2023-10-23 NOTE — Progress Notes (Signed)
  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0 10/23/2023 0402   BASOSABS 0.0 09/06/2023 1124    BMET    Component Value Date/Time   NA 137 10/23/2023 0402   NA 142 09/06/2023 1124   K 3.8 10/23/2023 0402   CL 104 10/23/2023 0402   CO2 23 10/23/2023 0402   GLUCOSE 89 10/23/2023 0402   BUN 7 (L) 10/23/2023 0402   BUN 11 09/06/2023 1124   CREATININE 1.27 (H) 10/23/2023 0402   CALCIUM 8.3 (L) 10/23/2023 0402   GFRNONAA >60 10/23/2023 0402   GFRAA 74 08/10/2020 1150    INR    Component Value Date/Time   INR 2.9 (H) 10/23/2023 0402   INR 2.3 (H) 09/21/2023 1131     Intake/Output Summary (Last 24 hours) at 10/23/2023 0954 Last data filed at 10/23/2023 0443 Gross per 24 hour  Intake 3 ml  Output 100 ml  Net -97 ml      Assessment/Plan:  73 y.o. male with critical limb ischemia of LLE   - The patient has critical limb ischemia with dry gangrenous changes to his left 4th and 5th toes -His ABIs on the left are noncompressible with dampened monophasic tibial  vessel flow.  His left great toe pressure is 0 -He will require left lower extremity angiogram with possible intervention to improve his blood flow and increase chances of healing future amputation -We will plan for left lower extremity angiogram tomorrow in the Cath Lab with Dr. Rosalva Comber.  The patient will be n.p.o. past midnight.  Consent order placed.  He is agreeable to intervention   Deneise Finlay, PA-C [facility] [phone] 10/23/2023 9:54 AM

## 2023-10-23 NOTE — Progress Notes (Signed)
  Echocardiogram 2D Echocardiogram has been performed.  Jason Spencer 10/23/2023, 9:29 AM

## 2023-10-23 NOTE — Progress Notes (Signed)
 PHARMACY - ANTICOAGULATION CONSULT NOTE  Pharmacy Consult for Heparin when INR </= 2.0 (warfarin on hold) Indication: atrial fibrillation and limb ischemia  Allergies  Allergen Reactions   Shellfish Allergy Nausea And Vomiting and Other (See Comments)    Shrimp, especially    Patient Measurements: Height: 5\' 10"  (177.8 cm) Weight: 84.1 kg (185 lb 6.5 oz) IBW/kg (Calculated) : 73 HEPARIN DW (KG): 86.2  Vital Signs: Temp: 97.9 F (36.6 C) (04/15 0441) Temp Source: Oral (04/15 0441) BP: 117/55 (04/15 0441) Pulse Rate: 87 (04/14 2124)  Labs: Recent Labs    10/21/23 1327 10/21/23 1424 10/22/23 0346 10/23/23 0402  HGB 12.2*  --  11.9* 11.4*  HCT 38.7*  --  37.2* 35.1*  PLT 432*  --  385 372  LABPROT  --  35.2* 38.3* 30.8*  INR  --  3.5* 3.9* 2.9*  CREATININE 1.04  --  1.19 1.27*    Estimated Creatinine Clearance: 55.9 mL/min (A) (by C-G formula based on SCr of 1.27 mg/dL (H)).  Assessment: 56 yom with a history of HF, AF on warfarin, CAD, COPD, PAD . Patient presenting with toe pain, reports 5th and 4th digit on his left foot gradually started becoming black proximately 2 weeks ago. Heparin per pharmacy consult placed for atrial fibrillation and limb ischemia .  Holding warfarin.  INR 3.5 on admit 4/13 and last warfarin dose taken 4/13 prior to admit. INR up to 3.9 on 4/14 and down to 2.9 today.  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  Ortho following for possible debridement or amputation. [facility] planning angiogram on 4/16.  Goal of Therapy:  Heparin level 0.3-0.7 units/ml Monitor platelets by anticoagulation protocol: Yes   Plan:  No heparin yet; begin when INR </= 2.0 Daily PT/INR. Warfarin on hold.  Adolphus Akin, RPh 10/23/2023,7:33 AM

## 2023-10-24 DIAGNOSIS — I4892 Unspecified atrial flutter: Secondary | ICD-10-CM | POA: Diagnosis not present

## 2023-10-24 DIAGNOSIS — M869 Osteomyelitis, unspecified: Secondary | ICD-10-CM | POA: Diagnosis not present

## 2023-10-24 DIAGNOSIS — I998 Other disorder of circulatory system: Secondary | ICD-10-CM | POA: Diagnosis not present

## 2023-10-24 LAB — BRAIN NATRIURETIC PEPTIDE: B Natriuretic Peptide: 387.7 pg/mL — ABNORMAL HIGH (ref 0.0–100.0)

## 2023-10-24 LAB — TYPE AND SCREEN
ABO/RH(D): O POS
Antibody Screen: NEGATIVE

## 2023-10-24 LAB — CBC WITH DIFFERENTIAL/PLATELET
Abs Immature Granulocytes: 0.09 10*3/uL — ABNORMAL HIGH (ref 0.00–0.07)
Basophils Absolute: 0 10*3/uL (ref 0.0–0.1)
Basophils Relative: 0 %
Eosinophils Absolute: 0.2 10*3/uL (ref 0.0–0.5)
Eosinophils Relative: 1 %
HCT: 34.7 % — ABNORMAL LOW (ref 39.0–52.0)
Hemoglobin: 11 g/dL — ABNORMAL LOW (ref 13.0–17.0)
Immature Granulocytes: 1 %
Lymphocytes Relative: 11 %
Lymphs Abs: 1.2 10*3/uL (ref 0.7–4.0)
MCH: 30.8 pg (ref 26.0–34.0)
MCHC: 31.7 g/dL (ref 30.0–36.0)
MCV: 97.2 fL (ref 80.0–100.0)
Monocytes Absolute: 1.1 10*3/uL — ABNORMAL HIGH (ref 0.1–1.0)
Monocytes Relative: 11 %
Neutro Abs: 7.9 10*3/uL — ABNORMAL HIGH (ref 1.7–7.7)
Neutrophils Relative %: 76 %
Platelets: 345 10*3/uL (ref 150–400)
RBC: 3.57 MIL/uL — ABNORMAL LOW (ref 4.22–5.81)
RDW: 12.2 % (ref 11.5–15.5)
WBC: 10.5 10*3/uL (ref 4.0–10.5)
nRBC: 0 % (ref 0.0–0.2)

## 2023-10-24 LAB — PROCALCITONIN: Procalcitonin: <0.1 ng/mL

## 2023-10-24 LAB — BASIC METABOLIC PANEL WITH GFR
Anion gap: 8 (ref 5–15)
BUN: 5 mg/dL — ABNORMAL LOW (ref 8–23)
CO2: 22 mmol/L (ref 22–32)
Calcium: 8.3 mg/dL — ABNORMAL LOW (ref 8.9–10.3)
Chloride: 106 mmol/L (ref 98–111)
Creatinine, Ser: 1.36 mg/dL — ABNORMAL HIGH (ref 0.61–1.24)
GFR, Estimated: 56 mL/min — ABNORMAL LOW (ref >60)
Glucose, Bld: 88 mg/dL (ref 70–99)
Potassium: 3.7 mmol/L (ref 3.5–5.1)
Sodium: 136 mmol/L (ref 135–145)

## 2023-10-24 LAB — PROTIME-INR
INR: 2.2 — ABNORMAL HIGH (ref 0.8–1.2)
INR: 1.7 — ABNORMAL HIGH (ref 0.8–1.2)
Prothrombin Time: 24.5 s — ABNORMAL HIGH (ref 11.4–15.2)
Prothrombin Time: 19.7 s — ABNORMAL HIGH (ref 11.4–15.2)

## 2023-10-24 LAB — MAGNESIUM: Magnesium: 1.9 mg/dL (ref 1.7–2.4)

## 2023-10-24 LAB — C-REACTIVE PROTEIN: CRP: 8.8 mg/dL — ABNORMAL HIGH (ref <1.0)

## 2023-10-24 MED ORDER — HEPARIN (PORCINE) 25000 UT/250ML-% IV SOLN
1350.0000 [IU]/h | INTRAVENOUS | Status: DC
  Administered 2023-10-24: 1200 [IU]/h via INTRAVENOUS
  Filled 2023-10-24 (×2): qty 250

## 2023-10-24 MED ORDER — VITAMIN K1 10 MG/ML IJ SOLN
2.0000 mg | Freq: Once | INTRAVENOUS | Status: AC
  Administered 2023-10-24: 2 mg via INTRAVENOUS
  Filled 2023-10-24: qty 0.2

## 2023-10-24 MED ORDER — MORPHINE SULFATE (PF) 2 MG/ML IV SOLN
1.0000 mg | INTRAVENOUS | Status: DC | PRN
  Administered 2023-10-24 – 2023-11-05 (×20): 1 mg via INTRAVENOUS
  Filled 2023-10-24 (×19): qty 1

## 2023-10-24 NOTE — Care Management Important Message (Signed)
 Important Message  Patient Details  Name: Jason Spencer MRN: [id number] Date of Birth: 05-09-1951   Important Message Given:  Yes - Medicare IM     Wynonia Hedges 10/24/2023, 3:08 PM

## 2023-10-24 NOTE — Progress Notes (Signed)
 PROGRESS NOTE                                                                                                                                                                                                             Patient Demographics:    Jason Spencer, is a 73 y.o. male, DOB - 09/04/1950, XBJ:[id number]  Outpatient Primary MD for the patient is Jason Spencer, FNP    LOS - 3  Admit date - 10/21/2023    Chief Complaint  Patient presents with   Circulatory Problem       Brief Narrative (HPI from H&P)    73 y.o. male with medical history significant of hypertension, hyperlipidemia, atrial fibrillation/flutter, CAD, COPD, QT prolongation, dCHF, alcohol use, anxiety, pleural effusion presenting with worsening toe pain.   Patient has had some worsening toe pain and discoloration for the past 2 weeks.  He has noticed some gradual worsening pain and discoloration to the point where the toes are now turned black on the left foot.  Changes are at his 4th and 5th digit.  Has not become difficult to walk. He lives with his sister but she has been in the hospital last couple of weeks and someone came to check on him yesterday and recommended he be seen in the ED.   Subjective:   No significant events overnight, he reports pain in left foot is controlled with current regimen, .   Assessment  & Plan :   Left lower extremity critical limb ischemia with tissue loss of 4th and 5th digit left foot gangrene and osteomyelitis present on admission. Present on arrival - He is on Coumadin, INR in therapeutic range,  continue statin and beta-blocker at home dose, strictly counseled to quit smoking.  Continue empiric antibiotics which are vancomycin and Zosyn, orthopedics and vascular surgery on board, \\. - Vascular surgery input greatly appreciated, plan for angiogram today . - Continue with IV antibiotic vancomycin and Zosyn . - Blood  cultures remains negative, continue to follow and adjust antibiotics as needed . -  Baby aspirin after procedures are done, will likely require at the very minimum left transmetatarsal   Chronic atrial fibrillation.  - Continue home dose beta-blocker, on Coumadin, INR supratherapeutic Coumadin on hold, start heparin drip when INR less than 2.  Lab Results  Component Value Date  INR 2.2 (H) 10/24/2023   INR 2.9 (H) 10/23/2023   INR 3.9 (H) 10/22/2023    CKD 3A.  Baseline creatinine close to 1.3.  Monitor.    CAD.   -Stable no acute issues, no rest chest pain, on beta-blocker and statin continue, Coumadin as above, -counseled to quit smoking  - 2D echo with a preserved EF, no regional wall  motion abnormality   History of smoking and alcohol abuse.  Strictly counseled to quit both, CIWA protocol.  Dyslipidemia.  On statin.  Hypertension.  Continue combination of beta-blocker and Norvasc.  Monitor.       Condition - Extremely Guarded  Family Communication  :  none present  Code Status :  Full  Consults  :  Ortho, VVS  PUD Prophylaxis :    Procedures  :     TTE -   CTA -   VASCULAR 1. Severe multifocal high-grade stenosis involving the bilateral common femoral, superficial femoral, popliteal, and anterior tibial, posterior tibial and peroneal arteries bilaterally. 2. Aortic atherosclerosis.   NON-VASCULAR 1. Subcutaneous edema with subcutaneous gas involving in the lateral aspect of the left foot. No obvious abscess is seen. Evaluation for osteomyelitis is limited due to field of view. 2. Colonic diverticulosis      Disposition Plan  :    Status is: Inpatient   DVT Prophylaxis  :  heparin    Lab Results  Component Value Date   PLT 345 10/24/2023    Diet :  Diet Order             Diet NPO time specified Except for: Ice Chips, Sips with Meds  Diet effective midnight                    Inpatient Medications  Scheduled Meds:  amLODipine  5 mg  Oral Daily   aspirin  81 mg Oral Daily   budeson-glycopyrrolate-formoterol  2 puff Inhalation BID   linezolid  600 mg Oral Q12H   metoprolol succinate  100 mg Oral Daily   rosuvastatin  5 mg Oral Daily   sodium chloride flush  3 mL Intravenous Q12H   Continuous Infusions:  piperacillin-tazobactam 3.375 g (10/24/23 0455)   PRN Meds:.acetaminophen **OR** acetaminophen, HYDROcodone-acetaminophen, morphine injection, polyethylene glycol    Objective:   Vitals:   10/23/23 2347 10/24/23 0453 10/24/23 0808 10/24/23 0824  BP: 134/75 (!) 121/98 116/68 (!) 151/81  Pulse: 71 93  79  Resp: 19 18 16 18   Temp: 97.6 F (36.4 C) 97.6 F (36.4 C)  98.1 F (36.7 C)  TempSrc: Oral Oral  Oral  SpO2: 95% 94% 95% 97%  Weight:      Height:        Wt Readings from Last 3 Encounters:  10/22/23 84.1 kg  09/21/23 87.2 kg  09/06/23 89.3 kg     Intake/Output Summary (Last 24 hours) at 10/24/2023 0914 Last data filed at 10/24/2023 0445 Gross per 24 hour  Intake 3 ml  Output 850 ml  Net -847 ml     Physical Exam  Awake Alert, Oriented X 3, No new F.N deficits, Normal affect Symmetrical Chest wall movement, Good air movement bilaterally, CTAB RRR,No Gallops,Rubs or new Murmurs, No Parasternal Heave +ve B.Sounds, Abd Soft, No tenderness, No rebound - guarding or rigidity.             Data Review:    Recent Labs  Lab 10/21/23 1327 10/22/23 0346 10/23/23 0402 10/24/23 0350  WBC 15.7* 11.2* 12.1* 10.5  HGB 12.2* 11.9* 11.4* 11.0*  HCT 38.7* 37.2* 35.1* 34.7*  PLT 432* 385 372 345  MCV 98.2 96.4 96.2 97.2  MCH 31.0 30.8 31.2 30.8  MCHC 31.5 32.0 32.5 31.7  RDW 12.0 12.2 12.2 12.2  LYMPHSABS 1.3  --  1.4 1.2  MONOABS 1.5*  --  1.3* 1.1*  EOSABS 0.1  --  0.1 0.2  BASOSABS 0.1  --  0.0 0.0    Recent Labs  Lab 10/21/23 1327 10/21/23 1341 10/21/23 1424 10/21/23 1600 10/22/23 0346 10/22/23 0857 10/23/23 0402 10/24/23 0350  NA 135  --   --   --  138  --  137 136  K  4.2  --   --   --  3.8  --  3.8 3.7  CL 100  --   --   --  100  --  104 106  CO2 23  --   --   --  25  --  23 22  ANIONGAP 12  --   --   --  13  --  10 8  GLUCOSE 114*  --   --   --  89  --  89 88  BUN 9  --   --   --  8  --  7* 5*  CREATININE 1.04  --   --   --  1.19  --  1.27* 1.36*  AST 19  --   --   --  20  --   --   --   ALT 10  --   --   --  12  --   --   --   ALKPHOS 51  --   --   --  49  --   --   --   BILITOT 0.4  --   --   --  0.6  --   --   --   ALBUMIN 2.4*  --   --   --  2.2*  --   --   --   CRP  --   --   --  9.0*  --  9.9* 9.9* 8.8*  PROCALCITON  --   --   --   --   --  <0.10 <0.10 <0.10  LATICACIDVEN  --  1.6  --   --   --   --   --   --   INR  --   --  3.5*  --  3.9*  --  2.9* 2.2*  BNP  --   --   --   --   --   --  283.3* 387.7*  MG  --   --   --   --   --   --  2.0 1.9  CALCIUM 8.4*  --   --   --  8.7*  --  8.3* 8.3*      Recent Labs  Lab 10/21/23 1327 10/21/23 1341 10/21/23 1424 10/21/23 1600 10/22/23 0346 10/22/23 0857 10/23/23 0402 10/24/23 0350  CRP  --   --   --  9.0*  --  9.9* 9.9* 8.8*  PROCALCITON  --   --   --   --   --  <0.10 <0.10 <0.10  LATICACIDVEN  --  1.6  --   --   --   --   --   --   INR  --   --  3.5*  --  3.9*  --  2.9* 2.2*  BNP  --   --   --   --   --   --  283.3* 387.7*  MG  --   --   --   --   --   --  2.0 1.9  CALCIUM 8.4*  --   --   --  8.7*  --  8.3* 8.3*    --------------------------------------------------------------------------------------------------------------- Lab Results  Component Value Date   CHOL 213 (H) 09/06/2023   HDL 40 09/06/2023   LDLCALC 123 (H) 09/06/2023   TRIG 281 (H) 09/06/2023   CHOLHDL 5.3 (H) 09/06/2023    Lab Results  Component Value Date   HGBA1C 5.5 04/27/2016   No results for input(s): "TSH", "T4TOTAL", "FREET4", "T3FREE", "THYROIDAB" in the last 72 hours. No results for input(s): "VITAMINB12", "FOLATE", "FERRITIN", "TIBC", "IRON", "RETICCTPCT" in the last 72  hours. ------------------------------------------------------------------------------------------------------------------ Cardiac Enzymes No results for input(s): "CKMB", "TROPONINI", "MYOGLOBIN" in the last 168 hours.  Invalid input(s): "CK"  Micro Results Recent Results (from the past 240 hours)  Blood culture (routine x 2)     Status: None (Preliminary result)   Collection Time: 10/21/23  1:30 PM   Specimen: BLOOD LEFT ARM  Result Value Ref Range Status   Specimen Description BLOOD LEFT ARM  Final   Special Requests   Final    BOTTLES DRAWN AEROBIC AND ANAEROBIC Blood Culture results may not be optimal due to an inadequate volume of blood received in culture bottles   Culture   Final    NO GROWTH 3 DAYS Performed at [facility], [address] [place], [place] [address]    Report Status PENDING  Incomplete  Blood culture (routine x 2)     Status: None (Preliminary result)   Collection Time: 10/21/23  2:24 PM   Specimen: BLOOD  Result Value Ref Range Status   Specimen Description BLOOD RIGHT ANTECUBITAL  Final   Special Requests   Final    BOTTLES DRAWN AEROBIC AND ANAEROBIC Blood Culture adequate volume   Culture   Final    NO GROWTH 3 DAYS Performed at [facility], [address], [place], [place] [address]    Report Status PENDING  Incomplete  MRSA Next Gen by PCR, Nasal     Status: None   Collection Time: 10/21/23  9:15 PM   Specimen: Nasal Mucosa; Nasal Swab  Result Value Ref Range Status   MRSA by PCR Next Gen NOT DETECTED NOT DETECTED Final    Comment: (NOTE) The GeneXpert MRSA Assay (FDA approved for NASAL specimens only), is one component of a comprehensive MRSA colonization surveillance program. It is not intended to diagnose MRSA infection nor to guide or monitor treatment for MRSA infections. Test performance is not FDA approved in patients less than 57 years old. Performed at [facility], [address], [place], [place] [address]      Radiology Report ECHOCARDIOGRAM COMPLETE Result Date: 10/23/2023    ECHOCARDIOGRAM REPORT   Patient Name:   MILLER LIMEHOUSE Date of Exam: 10/23/2023 Medical Rec #:  [id number]      Height:       70.0 in Accession #:    [id number]     Weight:       185.4 lb Date of Birth:  09-Oct-1950     BSA:          2.021 m Patient Age:    72 years       BP:  117/55 mmHg Patient Gender: M              HR:           70 bpm. Exam Location:  Inpatient Procedure: 2D Echo, Cardiac Doppler and Color Doppler (Both Spectral and Color            Flow Doppler were utilized during procedure). Indications:    I50.40* Unspecified combined systolic (congestive) and diastolic                 (congestive) heart failure  History:        Patient has prior history of Echocardiogram examinations, most                 recent 08/06/2019. CHF and Cardiomyopathy, Abnormal ECG, COPD,                 Arrythmias:Atrial Flutter; Risk Factors:Current Smoker. ETOH.  Sonographer:    Sheralyn Boatman RDCS Referring Phys: [id number] [facility]  Sonographer Comments: Technically difficult study due to poor echo windows. Patient moving throughout exam grabbing onto foot. IMPRESSIONS  1. Left ventricular ejection fraction, by estimation, is 65 to 70%. The left ventricle has normal function. The left ventricle has no regional wall motion abnormalities. Left ventricular diastolic parameters are indeterminate.  2. Right ventricular systolic function is normal. The right ventricular size is mildly enlarged.  3. Left atrial size was moderately dilated.  4. The mitral valve is normal in structure. Trivial mitral valve regurgitation. No evidence of mitral stenosis.  5. The aortic valve is tricuspid. There is mild calcification of the aortic valve. There is mild thickening of the aortic valve. Aortic valve regurgitation is not visualized. Aortic valve sclerosis is present, with no evidence of aortic valve stenosis. Aortic valve mean gradient measures 4.7 mmHg. Aortic  valve Vmax measures 1.49 m/s.  6. The inferior vena cava is normal in size with greater than 50% respiratory variability, suggesting right atrial pressure of 3 mmHg. FINDINGS  Left Ventricle: Left ventricular ejection fraction, by estimation, is 65 to 70%. The left ventricle has normal function. The left ventricle has no regional wall motion abnormalities. The left ventricular internal cavity size was normal in size. There is  no left ventricular hypertrophy. Left ventricular diastolic parameters are indeterminate. Right Ventricle: The right ventricular size is mildly enlarged. No increase in right ventricular wall thickness. Right ventricular systolic function is normal. Left Atrium: Left atrial size was moderately dilated. Right Atrium: Right atrial size was normal in size. Pericardium: There is no evidence of pericardial effusion. Mitral Valve: The mitral valve is normal in structure. Mild mitral annular calcification. Trivial mitral valve regurgitation. No evidence of mitral valve stenosis. Tricuspid Valve: The tricuspid valve is normal in structure. Tricuspid valve regurgitation is not demonstrated. No evidence of tricuspid stenosis. Aortic Valve: The aortic valve is tricuspid. There is mild calcification of the aortic valve. There is mild thickening of the aortic valve. Aortic valve regurgitation is not visualized. Aortic valve sclerosis is present, with no evidence of aortic valve stenosis. Aortic valve mean gradient measures 4.7 mmHg. Aortic valve peak gradient measures 8.9 mmHg. Aortic valve area, by VTI measures 2.05 cm. Pulmonic Valve: The pulmonic valve was normal in structure. Pulmonic valve regurgitation is not visualized. No evidence of pulmonic stenosis. Aorta: The aortic root is normal in size and structure. Venous: The inferior vena cava is normal in size with greater than 50% respiratory variability, suggesting right atrial pressure of 3 mmHg. IAS/Shunts:  No atrial level shunt detected by color  flow Doppler.  LEFT VENTRICLE PLAX 2D LVIDd:         4.70 cm LVIDs:         3.20 cm LV PW:         1.60 cm LV IVS:        1.20 cm LVOT diam:     2.20 cm LV SV:         57 LV SV Index:   28 LVOT Area:     3.80 cm  LV Volumes (MOD) LV vol d, MOD A2C: 84.1 ml LV vol d, MOD A4C: 73.6 ml LV vol s, MOD A2C: 24.1 ml LV vol s, MOD A4C: 24.2 ml LV SV MOD A2C:     60.0 ml LV SV MOD A4C:     73.6 ml LV SV MOD BP:      57.6 ml RIGHT VENTRICLE            IVC RV S prime:     9.03 cm/s  IVC diam: 1.90 cm TAPSE (M-mode): 1.3 cm LEFT ATRIUM             Index        RIGHT ATRIUM           Index LA diam:        5.00 cm 2.47 cm/m   RA Area:     21.90 cm LA Vol (A2C):   91.1 ml 45.07 ml/m  RA Volume:   63.70 ml  31.52 ml/m LA Vol (A4C):   83.0 ml 41.07 ml/m LA Biplane Vol: 90.3 ml 44.68 ml/m  AORTIC VALVE AV Area (Vmax):    2.03 cm AV Area (Vmean):   2.02 cm AV Area (VTI):     2.05 cm AV Vmax:           149.33 cm/s AV Vmean:          100.367 cm/s AV VTI:            0.278 m AV Peak Grad:      8.9 mmHg AV Mean Grad:      4.7 mmHg LVOT Vmax:         79.90 cm/s LVOT Vmean:        53.300 cm/s LVOT VTI:          0.150 m LVOT/AV VTI ratio: 0.54  AORTA Ao Root diam: 3.70 cm Ao Asc diam:  3.77 cm MITRAL VALVE MV Area (PHT): 2.91 cm    SHUNTS MV Decel Time: 261 msec    Systemic VTI:  0.15 m MV E velocity: 79.50 cm/s  Systemic Diam: 2.20 cm Donato Schultz MD Electronically signed by Donato Schultz MD Signature Date/Time: 10/23/2023/10:01:05 AM    Final    VAS [place] ABI WITH/WO TBI Result Date: 10/22/2023  LOWER EXTREMITY DOPPLER STUDY Patient Name:  MORDCHE HEDGLIN  Date of Exam:   10/22/2023 Medical Rec #: [id number]       Accession #:    [id number] Date of Birth: 04/30/1951      Patient Gender: M Patient Age:   8 years Exam Location:  [facility] Procedure:      VAS [place] ABI WITH/WO TBI Referring Phys: Lyn Hollingshead MELVIN --------------------------------------------------------------------------------  Indications: Rest pain, and gangrene.  Osteomyelitis High Risk Factors: Hypertension, hyperlipidemia, current smoker, coronary artery                    disease. Other Factors: Atrial flutter.  Vascular Interventions: History of right GSV ablation 2019. Limitations: Today's exam was limited due to Skin texture/Psoraisis, involuntary              patient movement. Comparison Study: No prior study Performing Technologist: Sherren Kerns RVS  Examination Guidelines: A complete evaluation includes at minimum, Doppler waveform signals and systolic blood pressure reading at the level of bilateral brachial, anterior tibial, and posterior tibial arteries, when vessel segments are accessible. Bilateral testing is considered an integral part of a complete examination. Photoelectric Plethysmograph (PPG) waveforms and toe systolic pressure readings are included as required and additional duplex testing as needed. Limited examinations for reoccurring indications may be performed as noted.  ABI Findings: +---------+-----------------+-----+-----------------+-------------------------+ Right    Rt Pressure      IndexWaveform         Comment                            (mmHg)                                                           +---------+-----------------+-----+-----------------+-------------------------+ Brachial 126                   triphasic                                  +---------+-----------------+-----+-----------------+-------------------------+ PTA      254              2.02 dampened                                                                  monophasic                                 +---------+-----------------+-----+-----------------+-------------------------+ DP       80               0.63                  audibly dampened                                                          monophasic                +---------+-----------------+-----+-----------------+-------------------------+ Great Toe29                0.23 Abnormal                                   +---------+-----------------+-----+-----------------+-------------------------+ +---------+------------------+-----+-------------------+-------+ Left     Lt Pressure (mmHg)IndexWaveform           Comment +---------+------------------+-----+-------------------+-------+ Brachial 124  triphasic                  +---------+------------------+-----+-------------------+-------+ PTA      80                0.63 dampened monophasic        +---------+------------------+-----+-------------------+-------+ DP       254               2.02 dampened monophasic        +---------+------------------+-----+-------------------+-------+ Great Toe0                 0.00 Absent                     +---------+------------------+-----+-------------------+-------+ +-------+---------------------+-----------+------------+------------+ ABI/TBIToday's ABI          Today's TBIPrevious ABIPrevious TBI +-------+---------------------+-----------+------------+------------+ Right  2.02 non compressible0.23                                +-------+---------------------+-----------+------------+------------+ Left   2.02 non compressibleabsent                              +-------+---------------------+-----------+------------+------------+ Arterial wall calcification precludes accurate ankle pressures and ABIs.  Summary: Right: Resting right ankle-brachial index indicates noncompressible right lower extremity arteries. The right toe-brachial index is abnormal. Left: Resting left ankle-brachial index indicates noncompressible left lower extremity arteries. The left toe-brachial index is abnormal. *See table(s) above for measurements and observations.  Electronically signed by Jimmye Moulds MD on 10/22/2023 at 6:35:14 PM.    Final      Signature  -   Seena Dadds M.D on 10/24/2023 at 9:14 AM   -  To page go to [email or web address]

## 2023-10-24 NOTE — Plan of Care (Signed)
  Problem: Clinical Measurements: Goal: Ability to maintain clinical measurements within normal limits will improve Outcome: Progressing Goal: Will remain free from infection Outcome: Progressing   Problem: Activity: Goal: Risk for activity intolerance will decrease Outcome: Progressing   Problem: Pain Managment: Goal: General experience of comfort will improve and/or be controlled Outcome: Progressing

## 2023-10-24 NOTE — Progress Notes (Signed)
 PHARMACY - ANTICOAGULATION CONSULT NOTE  Pharmacy Consult for Heparin when INR </= 2.0 (warfarin on hold) Indication: atrial fibrillation and limb ischemia  Allergies  Allergen Reactions   Shellfish Allergy Nausea And Vomiting and Other (See Comments)    Shrimp, especially    Patient Measurements: Height: 5\' 10"  (177.8 cm) Weight: 84.1 kg (185 lb 6.5 oz) IBW/kg (Calculated) : 73 HEPARIN DW (KG): 86.2  Vital Signs: Temp: 97.6 F (36.4 C) (04/16 0453) Temp Source: Oral (04/16 0453) BP: 121/98 (04/16 0453) Pulse Rate: 93 (04/16 0453)  Labs: Recent Labs    10/22/23 0346 10/23/23 0402 10/24/23 0350  HGB 11.9* 11.4* 11.0*  HCT 37.2* 35.1* 34.7*  PLT 385 372 345  LABPROT 38.3* 30.8* 24.5*  INR 3.9* 2.9* 2.2*  CREATININE 1.19 1.27* 1.36*    Estimated Creatinine Clearance: 52.2 mL/min (A) (by C-G formula based on SCr of 1.36 mg/dL (H)).  Assessment: 68 yom with a history of HF, AF on warfarin, CAD, COPD, PAD . Patient presenting with toe pain, reports 5th and 4th digit on his left foot gradually started becoming black proximately 2 weeks ago. Heparin per pharmacy consult placed for atrial fibrillation and limb ischemia .  Holding warfarin.  INR 3.5 on admit 4/13 and last warfarin dose taken 4/13 prior to admit. INR up to 3.9 on 4/14 and 2.9 on 4/15 > down to 2.2 today.  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  Ortho following for possible debridement or amputation. VVS planning angiogram on 4/16.  Goal of Therapy:  Heparin level 0.3-0.7 units/ml Monitor platelets by anticoagulation protocol: Yes   Plan:  No heparin yet; begin when INR </= 2.0 Daily PT/INR. Warfarin on hold.  Jason Spencer, RPh 10/24/2023,7:38 AM

## 2023-10-24 NOTE — Progress Notes (Signed)
 PHARMACY - ANTICOAGULATION CONSULT NOTE  Pharmacy Consult for Heparin when INR </= 2.0 (warfarin on hold) Indication: atrial fibrillation and limb ischemia  Allergies  Allergen Reactions   Shellfish Allergy Nausea And Vomiting and Other (See Comments)    Shrimp, especially    Patient Measurements: Height: 5\' 10"  (177.8 cm) Weight: 84.1 kg (185 lb 6.5 oz) IBW/kg (Calculated) : 73 HEPARIN DW (KG): 86.2  Vital Signs: Temp: 97.1 F (36.2 C) (04/16 1948) Temp Source: Oral (04/16 1948) BP: 115/65 (04/16 1948) Pulse Rate: 81 (04/16 1948)  Labs: Recent Labs    10/22/23 0346 10/23/23 0402 10/24/23 0350 10/24/23 2024  HGB 11.9* 11.4* 11.0*  --   HCT 37.2* 35.1* 34.7*  --   PLT 385 372 345  --   LABPROT 38.3* 30.8* 24.5* 19.7*  INR 3.9* 2.9* 2.2* 1.7*  CREATININE 1.19 1.27* 1.36*  --     Estimated Creatinine Clearance: 52.2 mL/min (A) (by C-G formula based on SCr of 1.36 mg/dL (H)).  Assessment: 72 yom with a history of HF, AF on warfarin, CAD, COPD, PAD . Patient presenting with toe pain, reports 5th and 4th digit on his left foot gradually started becoming black proximately 2 weeks ago. Heparin per pharmacy consult placed for atrial fibrillation and limb ischemia .  Holding warfarin.  INR 3.5 on admit 4/13 and last warfarin dose taken 4/13 prior to admit. INR up to 3.9 on 4/14 and 2.9 on 4/15 > down to 2.2 today.  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  Ortho following for possible debridement or amputation. VVS planning angiogram on 4/16.  PM: INR 1.7 - will begin IV heparin tonight  Goal of Therapy:  Heparin level 0.3-0.7 units/ml Monitor platelets by anticoagulation protocol: Yes   Plan:  Begin IV heparin infusion 1200 units/hr (no bolus) Check heparin level in 8hrs and daily while on heparin Daily PT/INR, CBC. Warfarin on hold.  Armanda Bern, PharmD, BCPS 10/24/2023,9:11 PM  Please check AMION  for all [facility] Pharmacy phone numbers After 10:00 PM, call Main Pharmacy [phone]

## 2023-10-25 ENCOUNTER — Encounter (HOSPITAL_COMMUNITY)
Admission: EM | Disposition: A | Discharge status: Skilled Nursing Facility | Payer: Self-pay | Source: Home / Self Care | Attending: Student

## 2023-10-25 DIAGNOSIS — I70245 Atherosclerosis of native arteries of left leg with ulceration of other part of foot: Secondary | ICD-10-CM

## 2023-10-25 DIAGNOSIS — M869 Osteomyelitis, unspecified: Secondary | ICD-10-CM | POA: Diagnosis not present

## 2023-10-25 DIAGNOSIS — I4892 Unspecified atrial flutter: Secondary | ICD-10-CM | POA: Diagnosis not present

## 2023-10-25 HISTORY — PX: LOWER EXTREMITY INTERVENTION: CATH118252

## 2023-10-25 HISTORY — PX: LOWER EXTREMITY ANGIOGRAPHY: CATH118251

## 2023-10-25 LAB — POCT I-STAT, CHEM 8
BUN: 13 mg/dL (ref 8–23)
Calcium, Ion: 1.16 mmol/L (ref 1.15–1.40)
Chloride: 108 mmol/L (ref 98–111)
Creatinine, Ser: 1.4 mg/dL — ABNORMAL HIGH (ref 0.61–1.24)
Glucose, Bld: 110 mg/dL — ABNORMAL HIGH (ref 70–99)
HCT: 34 % — ABNORMAL LOW (ref 39.0–52.0)
Hemoglobin: 11.6 g/dL — ABNORMAL LOW (ref 13.0–17.0)
Potassium: 4.1 mmol/L (ref 3.5–5.1)
Sodium: 139 mmol/L (ref 135–145)
TCO2: 21 mmol/L — ABNORMAL LOW (ref 22–32)

## 2023-10-25 LAB — CBC WITH DIFFERENTIAL/PLATELET
Abs Immature Granulocytes: 0.12 10*3/uL — ABNORMAL HIGH (ref 0.00–0.07)
Basophils Absolute: 0 10*3/uL (ref 0.0–0.1)
Basophils Relative: 0 %
Eosinophils Absolute: 0.2 10*3/uL (ref 0.0–0.5)
Eosinophils Relative: 1 %
HCT: 36.3 % — ABNORMAL LOW (ref 39.0–52.0)
Hemoglobin: 11.7 g/dL — ABNORMAL LOW (ref 13.0–17.0)
Immature Granulocytes: 1 %
Lymphocytes Relative: 13 %
Lymphs Abs: 1.5 10*3/uL (ref 0.7–4.0)
MCH: 30.9 pg (ref 26.0–34.0)
MCHC: 32.2 g/dL (ref 30.0–36.0)
MCV: 95.8 fL (ref 80.0–100.0)
Monocytes Absolute: 1 10*3/uL (ref 0.1–1.0)
Monocytes Relative: 9 %
Neutro Abs: 8.7 10*3/uL — ABNORMAL HIGH (ref 1.7–7.7)
Neutrophils Relative %: 76 %
Platelets: 372 10*3/uL (ref 150–400)
RBC: 3.79 MIL/uL — ABNORMAL LOW (ref 4.22–5.81)
RDW: 12.2 % (ref 11.5–15.5)
WBC: 11.4 10*3/uL — ABNORMAL HIGH (ref 4.0–10.5)
nRBC: 0 % (ref 0.0–0.2)

## 2023-10-25 LAB — BASIC METABOLIC PANEL WITH GFR
Anion gap: 15 (ref 5–15)
BUN: 11 mg/dL (ref 8–23)
CO2: 20 mmol/L — ABNORMAL LOW (ref 22–32)
Calcium: 8.7 mg/dL — ABNORMAL LOW (ref 8.9–10.3)
Chloride: 103 mmol/L (ref 98–111)
Creatinine, Ser: 1.18 mg/dL (ref 0.61–1.24)
GFR, Estimated: >60 mL/min (ref >60)
Glucose, Bld: 87 mg/dL (ref 70–99)
Potassium: 3.9 mmol/L (ref 3.5–5.1)
Sodium: 138 mmol/L (ref 135–145)

## 2023-10-25 LAB — MAGNESIUM: Magnesium: 2 mg/dL (ref 1.7–2.4)

## 2023-10-25 LAB — POCT ACTIVATED CLOTTING TIME
Activated Clotting Time: 262 s
Activated Clotting Time: 199 s
Activated Clotting Time: 187 s

## 2023-10-25 LAB — HEPARIN LEVEL (UNFRACTIONATED): Heparin Unfractionated: 0.16 [IU]/mL — ABNORMAL LOW (ref 0.30–0.70)

## 2023-10-25 LAB — PROTIME-INR
INR: 1.4 — ABNORMAL HIGH (ref 0.8–1.2)
Prothrombin Time: 17.4 s — ABNORMAL HIGH (ref 11.4–15.2)

## 2023-10-25 SURGERY — LOWER EXTREMITY ANGIOGRAPHY
Anesthesia: LOCAL | Laterality: Left

## 2023-10-25 MED ORDER — MORPHINE SULFATE (PF) 2 MG/ML IV SOLN
INTRAVENOUS | Status: AC
  Filled 2023-10-25: qty 1

## 2023-10-25 MED ORDER — CLOPIDOGREL BISULFATE 300 MG PO TABS
ORAL_TABLET | ORAL | Status: DC | PRN
  Administered 2023-10-25: 300 mg via ORAL

## 2023-10-25 MED ORDER — LABETALOL HCL 5 MG/ML IV SOLN: 10.0000 mg | INTRAVENOUS | Status: DC | PRN

## 2023-10-25 MED ORDER — CLOBETASOL PROPIONATE 0.05 % EX CREA
TOPICAL_CREAM | Freq: Two times a day (BID) | CUTANEOUS | Status: DC
  Administered 2023-10-30: 1 via TOPICAL
  Filled 2023-10-25 (×4): qty 15

## 2023-10-25 MED ORDER — ACETAMINOPHEN 325 MG PO TABS: 650.0000 mg | ORAL_TABLET | ORAL | Status: DC | PRN

## 2023-10-25 MED ORDER — CLOPIDOGREL BISULFATE 300 MG PO TABS
ORAL_TABLET | ORAL | Status: AC
  Filled 2023-10-25: qty 1

## 2023-10-25 MED ORDER — HEPARIN (PORCINE) IN NACL 1000-0.9 UT/500ML-% IV SOLN
INTRAVENOUS | Status: DC | PRN
  Administered 2023-10-25 (×2): 500 mL

## 2023-10-25 MED ORDER — HEPARIN (PORCINE) 25000 UT/250ML-% IV SOLN
1350.0000 [IU]/h | INTRAVENOUS | Status: DC
  Filled 2023-10-25: qty 250

## 2023-10-25 MED ORDER — HEPARIN SODIUM (PORCINE) 1000 UNIT/ML IJ SOLN
INTRAMUSCULAR | Status: AC
  Filled 2023-10-25: qty 10

## 2023-10-25 MED ORDER — HEPARIN SODIUM (PORCINE) 1000 UNIT/ML IJ SOLN
INTRAMUSCULAR | Status: DC | PRN
  Administered 2023-10-25: 2000 [IU] via INTRAVENOUS
  Administered 2023-10-25: 7000 [IU] via INTRAVENOUS
  Administered 2023-10-25: 2000 [IU] via INTRAVENOUS

## 2023-10-25 MED ORDER — MIDAZOLAM HCL 2 MG/2ML IJ SOLN
INTRAMUSCULAR | Status: AC
  Filled 2023-10-25: qty 2

## 2023-10-25 MED ORDER — FENTANYL CITRATE (PF) 100 MCG/2ML IJ SOLN
INTRAMUSCULAR | Status: DC | PRN
  Administered 2023-10-25 (×2): 25 ug via INTRAVENOUS

## 2023-10-25 MED ORDER — LIDOCAINE HCL (PF) 1 % IJ SOLN
INTRAMUSCULAR | Status: DC | PRN
  Administered 2023-10-25: 15 mL

## 2023-10-25 MED ORDER — HYDRALAZINE HCL 20 MG/ML IJ SOLN: 5.0000 mg | INTRAMUSCULAR | Status: DC | PRN

## 2023-10-25 MED ORDER — FENTANYL CITRATE (PF) 100 MCG/2ML IJ SOLN
INTRAMUSCULAR | Status: AC
  Filled 2023-10-25: qty 2

## 2023-10-25 MED ORDER — MIDAZOLAM HCL 2 MG/2ML IJ SOLN
INTRAMUSCULAR | Status: DC | PRN
  Administered 2023-10-25 (×2): 1 mg via INTRAVENOUS

## 2023-10-25 MED ORDER — SODIUM CHLORIDE 0.9 % IV SOLN: INTRAVENOUS | Status: AC

## 2023-10-25 MED ORDER — LIDOCAINE HCL (PF) 1 % IJ SOLN
INTRAMUSCULAR | Status: AC
  Filled 2023-10-25: qty 30

## 2023-10-25 SURGICAL SUPPLY — 29 items
BALLN MUSTANG 5X200X135 (BALLOONS) ×1 IMPLANT
BALLN STERLING OTW 5X220X150 (BALLOONS) ×1 IMPLANT
BALLOON MUSTANG 5X200X135 (BALLOONS) IMPLANT
BALLOON STERLING OTW 5X220X150 (BALLOONS) IMPLANT
CATH CXI SUPP 2.6F 135 ST (CATHETERS) IMPLANT
CATH OMNI FLUSH 5F 65CM (CATHETERS) IMPLANT
CATH QUICKCROSS .018X135CM (MICROCATHETER) IMPLANT
CATH QUICKCROSS SUPP .035X90CM (MICROCATHETER) IMPLANT
CATH SHOCKWAVE E8 3X80 (CATHETERS) IMPLANT
CATH SHOCKWAVE E8 4X80 (CATHETERS) IMPLANT
COVER DOME SNAP 22 D (MISCELLANEOUS) IMPLANT
DCB RANGER 4.0X80 135 (BALLOONS) IMPLANT
DEVICE TORQUE .025-.038 (MISCELLANEOUS) IMPLANT
GLIDEWIRE ADV .035X260CM (WIRE) IMPLANT
KIT ENCORE 26 ADVANTAGE (KITS) IMPLANT
KIT MICROPUNCTURE NIT STIFF (SHEATH) IMPLANT
RANGER DCB 4.0X80 135 (BALLOONS) ×1 IMPLANT
SET ATX-X65L (MISCELLANEOUS) IMPLANT
SHEATH CATAPULT 6FR 45 (SHEATH) IMPLANT
SHEATH PINNACLE 5F 10CM (SHEATH) IMPLANT
SHEATH PINNACLE 6F 10CM (SHEATH) IMPLANT
SHEATH PROBE COVER 6X72 (BAG) IMPLANT
STENT ELUVIA 6X150X130 (Permanent Stent) IMPLANT
STENT ELUVIA 6X60X130 (Permanent Stent) IMPLANT
TRAY PV CATH (CUSTOM PROCEDURE TRAY) ×1 IMPLANT
WIRE BENTSON .035X145CM (WIRE) IMPLANT
WIRE G V18X300CM (WIRE) IMPLANT
WIRE SHEPHERD 30G .018 (WIRE) IMPLANT
WIRE SPARTACORE .014X300CM (WIRE) IMPLANT

## 2023-10-25 NOTE — Plan of Care (Signed)

## 2023-10-25 NOTE — Progress Notes (Signed)
 Pt. Transported off the floor

## 2023-10-25 NOTE — Progress Notes (Signed)
 PROGRESS NOTE                                                                                                                                                                                                             Patient Demographics:    Jason Spencer, is a 73 y.o. male, DOB - Jul 16, 1950, ZOX:[id number]  Outpatient Primary MD for the patient is Jason Spencer, FNP    LOS - 4  Admit date - 10/21/2023    Chief Complaint  Patient presents with   Circulatory Problem       Brief Narrative (HPI from H&P)    73 y.o. male with medical history significant of hypertension, hyperlipidemia, atrial fibrillation/flutter, CAD, COPD, QT prolongation, dCHF, alcohol use, anxiety, pleural effusion presenting with worsening toe pain.   Patient has had some worsening toe pain and discoloration for the past 2 weeks.  He has noticed some gradual worsening pain and discoloration to the point where the toes are now turned black on the left foot.  Changes are at his 4th and 5th digit.  Has not become difficult to walk. He lives with his sister but she has been in the hospital last couple of weeks and someone came to check on him yesterday and recommended he be seen in the ED.   Subjective:   No significant events overnight, he reports pain in left foot is controlled with current regimen, he has been compliant with his n.p.o. status.   Assessment  & Plan :   Left lower extremity critical limb ischemia with tissue loss of 4th and 5th digit left foot gangrene and osteomyelitis present on admission. Present on arrival - He is on Coumadin, INR in therapeutic range,  continue statin and beta-blocker at home dose, strictly counseled to quit smoking.  Continue empiric antibiotics which are vancomycin and Zosyn, orthopedics and vascular surgery on board,  - Vascular surgery input greatly appreciated, plan for angiogram today (after it was postponed  yesterday's INR was still elevated). -Deceived IV vitamin K yesterday to bring his INR less than 1.5 for procedure today - Continue with IV antibiotic vancomycin and Zosyn . - Blood cultures remains negative, continue to follow and adjust antibiotics as needed . -  Baby aspirin after procedures are done, will likely require at the very minimum left transmetatarsal   Chronic atrial  fibrillation.  Coagulopathy - Continue home dose beta-blocker, on Coumadin, INR supratherapeutic on admission, warfarin on hold in anticipation for procedure, required vitamin K for anticipation for angiogram today, rates 1.4 today, he is currently on heparin drip.    Lab Results  Component Value Date   INR 1.4 (H) 10/25/2023   INR 1.7 (H) 10/24/2023   INR 2.2 (H) 10/24/2023    CKD 3A.  Baseline creatinine close to 1.3.  Monitor.    Psoriasis - Patient with history of psoriasis, did not follow-up with his dermatologist for a while, will start on clobetasol  cream  CAD.   -Stable no acute issues, no rest chest pain, on beta-blocker and statin continue, Coumadin as above, -counseled to quit smoking  - 2D echo with a preserved EF, no regional wall  motion abnormality   History of smoking and alcohol abuse.  Strictly counseled to quit both, CIWA protocol.  Dyslipidemia.  On statin.  Hypertension.  Continue combination of beta-blocker and Norvasc.  Monitor.       Condition - Extremely Guarded  Family Communication  :  none present  Code Status :  Full  Consults  :  Ortho, VVS  PUD Prophylaxis :    Procedures  :     TTE -   CTA -   VASCULAR 1. Severe multifocal high-grade stenosis involving the bilateral common femoral, superficial femoral, popliteal, and anterior tibial, posterior tibial and peroneal arteries bilaterally. 2. Aortic atherosclerosis.   NON-VASCULAR 1. Subcutaneous edema with subcutaneous gas involving in the lateral aspect of the left foot. No obvious abscess is seen.  Evaluation for osteomyelitis is limited due to field of view. 2. Colonic diverticulosis      Disposition Plan  :    Status is: Inpatient   DVT Prophylaxis  :  heparin    Lab Results  Component Value Date   PLT 372 10/25/2023    Diet :  Diet Order             Diet NPO time specified Except for: Sips with Meds  Diet effective midnight                    Inpatient Medications  Scheduled Meds:  [MAR Hold] amLODipine  5 mg Oral Daily   [MAR Hold] aspirin  81 mg Oral Daily   [MAR Hold] budeson-glycopyrrolate-formoterol  2 puff Inhalation BID   [MAR Hold] linezolid  600 mg Oral Q12H   [MAR Hold] metoprolol succinate  100 mg Oral Daily   [MAR Hold] rosuvastatin  5 mg Oral Daily   [MAR Hold] sodium chloride flush  3 mL Intravenous Q12H   Continuous Infusions:  heparin 1,350 Units/hr (10/25/23 0641)   [MAR Hold] piperacillin-tazobactam 3.375 g (10/25/23 0508)   PRN Meds:.[MAR Hold] acetaminophen **OR** [MAR Hold] acetaminophen, [MAR Hold] HYDROcodone-acetaminophen, [MAR Hold]  morphine injection, [MAR Hold] polyethylene glycol    Objective:   Vitals:   10/25/23 0414 10/25/23 0759 10/25/23 0809 10/25/23 1100  BP: 116/85 128/72  122/78  Pulse: 93  82 83  Resp: 16 19 16 19   Temp: 97.6 F (36.4 C) 97.6 F (36.4 C)  (!) 97.4 F (36.3 C)  TempSrc: Oral Oral  Oral  SpO2: 95%  93% 93%  Weight:      Height:        Wt Readings from Last 3 Encounters:  10/22/23 84.1 kg  09/21/23 87.2 kg  09/06/23 89.3 kg     Intake/Output Summary (Last 24  hours) at 10/25/2023 1310 Last data filed at 10/25/2023 0508 Gross per 24 hour  Intake 603 ml  Output 400 ml  Net 203 ml     Physical Exam  Awake Alert, Oriented X 3, No new F.N deficits, Normal affect Symmetrical Chest wall movement, Good air movement bilaterally, CTAB RRR,No Gallops,Rubs or new Murmurs, No Parasternal Heave +ve B.Sounds, Abd Soft, No tenderness, No rebound - guarding or rigidity. Left foot gangrene,  up psoriasis lesion in right lower extremity and left upper extremity, please see pictures below                Data Review:    Recent Labs  Lab 10/21/23 1327 10/22/23 0346 10/23/23 0402 10/24/23 0350 10/25/23 0424  WBC 15.7* 11.2* 12.1* 10.5 11.4*  HGB 12.2* 11.9* 11.4* 11.0* 11.7*  HCT 38.7* 37.2* 35.1* 34.7* 36.3*  PLT 432* 385 372 345 372  MCV 98.2 96.4 96.2 97.2 95.8  MCH 31.0 30.8 31.2 30.8 30.9  MCHC 31.5 32.0 32.5 31.7 32.2  RDW 12.0 12.2 12.2 12.2 12.2  LYMPHSABS 1.3  --  1.4 1.2 1.5  MONOABS 1.5*  --  1.3* 1.1* 1.0  EOSABS 0.1  --  0.1 0.2 0.2  BASOSABS 0.1  --  0.0 0.0 0.0    Recent Labs  Lab 10/21/23 1327 10/21/23 1341 10/21/23 1424 10/21/23 1600 10/22/23 0346 10/22/23 0857 10/23/23 0402 10/24/23 0350 10/24/23 2024 10/25/23 0424  NA 135  --   --   --  138  --  137 136  --  138  K 4.2  --   --   --  3.8  --  3.8 3.7  --  3.9  CL 100  --   --   --  100  --  104 106  --  103  CO2 23  --   --   --  25  --  23 22  --  20*  ANIONGAP 12  --   --   --  13  --  10 8  --  15  GLUCOSE 114*  --   --   --  89  --  89 88  --  87  BUN 9  --   --   --  8  --  7* 5*  --  11  CREATININE 1.04  --   --   --  1.19  --  1.27* 1.36*  --  1.18  AST 19  --   --   --  20  --   --   --   --   --   ALT 10  --   --   --  12  --   --   --   --   --   ALKPHOS 51  --   --   --  49  --   --   --   --   --   BILITOT 0.4  --   --   --  0.6  --   --   --   --   --   ALBUMIN 2.4*  --   --   --  2.2*  --   --   --   --   --   CRP  --   --   --  9.0*  --  9.9* 9.9* 8.8*  --   --   PROCALCITON  --   --   --   --   --  <  0.10 <0.10 <0.10  --   --   LATICACIDVEN  --  1.6  --   --   --   --   --   --   --   --   INR  --   --    < >  --  3.9*  --  2.9* 2.2* 1.7* 1.4*  BNP  --   --   --   --   --   --  283.3* 387.7*  --   --   MG  --   --   --   --   --   --  2.0 1.9  --  2.0  CALCIUM 8.4*  --   --   --  8.7*  --  8.3* 8.3*  --  8.7*   < > = values in this interval not displayed.       Recent Labs  Lab 10/21/23 1327 10/21/23 1341 10/21/23 1424 10/21/23 1600 10/22/23 0346 10/22/23 0857 10/23/23 0402 10/24/23 0350 10/24/23 2024 10/25/23 0424  CRP  --   --   --  9.0*  --  9.9* 9.9* 8.8*  --   --   PROCALCITON  --   --   --   --   --  <0.10 <0.10 <0.10  --   --   LATICACIDVEN  --  1.6  --   --   --   --   --   --   --   --   INR  --   --    < >  --  3.9*  --  2.9* 2.2* 1.7* 1.4*  BNP  --   --   --   --   --   --  283.3* 387.7*  --   --   MG  --   --   --   --   --   --  2.0 1.9  --  2.0  CALCIUM 8.4*  --   --   --  8.7*  --  8.3* 8.3*  --  8.7*   < > = values in this interval not displayed.    --------------------------------------------------------------------------------------------------------------- Lab Results  Component Value Date   CHOL 213 (H) 09/06/2023   HDL 40 09/06/2023   LDLCALC 123 (H) 09/06/2023   TRIG 281 (H) 09/06/2023   CHOLHDL 5.3 (H) 09/06/2023    Lab Results  Component Value Date   HGBA1C 5.5 04/27/2016   No results for input(s): "TSH", "T4TOTAL", "FREET4", "T3FREE", "THYROIDAB" in the last 72 hours. No results for input(s): "VITAMINB12", "FOLATE", "FERRITIN", "TIBC", "IRON", "RETICCTPCT" in the last 72 hours. ------------------------------------------------------------------------------------------------------------------ Cardiac Enzymes No results for input(s): "CKMB", "TROPONINI", "MYOGLOBIN" in the last 168 hours.  Invalid input(s): "CK"  Micro Results Recent Results (from the past 240 hours)  Blood culture (routine x 2)     Status: None (Preliminary result)   Collection Time: 10/21/23  1:30 PM   Specimen: BLOOD LEFT ARM  Result Value Ref Range Status   Specimen Description BLOOD LEFT ARM  Final   Special Requests   Final    BOTTLES DRAWN AEROBIC AND ANAEROBIC Blood Culture results may not be optimal due to an inadequate volume of blood received in culture bottles   Culture   Final    NO GROWTH 4 DAYS Performed at  [facility], [address], [place], [place] [address]    Report Status PENDING  Incomplete  Blood culture (routine x 2)  Status: None (Preliminary result)   Collection Time: 10/21/23  2:24 PM   Specimen: BLOOD  Result Value Ref Range Status   Specimen Description BLOOD RIGHT ANTECUBITAL  Final   Special Requests   Final    BOTTLES DRAWN AEROBIC AND ANAEROBIC Blood Culture adequate volume   Culture   Final    NO GROWTH 4 DAYS Performed at [facility], [address] [place], [place] [address]    Report Status PENDING  Incomplete  MRSA Next Gen by PCR, Nasal     Status: None   Collection Time: 10/21/23  9:15 PM   Specimen: Nasal Mucosa; Nasal Swab  Result Value Ref Range Status   MRSA by PCR Next Gen NOT DETECTED NOT DETECTED Final    Comment: (NOTE) The GeneXpert MRSA Assay (FDA approved for NASAL specimens only), is one component of a comprehensive MRSA colonization surveillance program. It is not intended to diagnose MRSA infection nor to guide or monitor treatment for MRSA infections. Test performance is not FDA approved in patients less than 56 years old. Performed at [facility], [address], [place], [place] [address]     Radiology Report No results found.    Signature  -   Seena Dadds M.D on 10/25/2023 at 1:10 PM   -  To page go to [email or web address]

## 2023-10-25 NOTE — Progress Notes (Signed)
 OT Cancellation Note  Patient Details Name: Jason Spencer MRN: [id number] DOB: 12-29-1950   Cancelled Treatment:    Reason Eval/Treat Not Completed: (P) Patient at procedure or test/ unavailable, will return as able  Scherry Curtis 10/25/2023, 1:21 PM

## 2023-10-25 NOTE — Progress Notes (Signed)
 Site area: R groin (61fr Arterial) Site Prior to Removal:  Level 0 Pressure Applied For: 30 min Manual:   Yes Patient Status During Pull:  Stable Post Pull Site:  Level 0 Post Pull Instructions Given:  Yes Post Pull Pulses Present: Doppler R PT Dressing Applied:  Gauze & tegaderm Bedrest begins @ 1857 Comments: Pt reports mild discomfort during manual hold, site soft, mildly tender, bleeding controlled.  Pt verbalized understanding of post pull instruction, no questions at this time.  Will continue to monitor.

## 2023-10-25 NOTE — Progress Notes (Addendum)
 PHARMACY - ANTICOAGULATION CONSULT NOTE  Pharmacy Consult for Heparin when INR </= 2.0 (warfarin on hold) Indication: atrial fibrillation and limb ischemia  Allergies  Allergen Reactions   Shellfish Allergy Nausea And Vomiting and Other (See Comments)    Shrimp, especially    Patient Measurements: Height: 5\' 10"  (177.8 cm) Weight: 84.1 kg (185 lb 6.5 oz) IBW/kg (Calculated) : 73 HEPARIN DW (KG): 86.2  Vital Signs: Temp: 97.4 F (36.3 C) (04/17 1100) Temp Source: Oral (04/17 1100) BP: 106/64 (04/17 1845) Pulse Rate: 102 (04/17 1845)  Labs: Recent Labs    10/23/23 0402 10/24/23 0350 10/24/23 2024 10/25/23 0424  HGB 11.4* 11.0*  --  11.7*  HCT 35.1* 34.7*  --  36.3*  PLT 372 345  --  372  LABPROT 30.8* 24.5* 19.7* 17.4*  INR 2.9* 2.2* 1.7* 1.4*  HEPARINUNFRC  --   --   --  0.16*  CREATININE 1.27* 1.36*  --  1.18    Estimated Creatinine Clearance: 60.1 mL/min (by C-G formula based on SCr of 1.18 mg/dL).  Assessment: 75 yom with a history of HF, AF on warfarin, CAD, COPD, PAD . Patient presenting with toe pain, reports 5th and 4th digit on his left foot gradually started becoming black proximately 2 weeks ago. Heparin per pharmacy consult placed for atrial fibrillation and limb ischemia .  Holding warfarin.  INR 3.5 on admit 4/13 and last warfarin dose taken 4/13 prior to admit. INR up to 3.9 on 4/14 and 2.9 on 4/15 > down to 2.2 today.  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  Ortho following for possible debridement or amputation. VVS planning angiogram on 4/16.  PM: now s/p VVS procedure, per d/w Dr. Susi Eric - ok to resume heparin 6hr after sheath removed.  Goal of Therapy:  Heparin level 0.3-0.7 units/ml Monitor platelets by anticoagulation protocol: Yes   Plan:  At midnight,  Resume heparin infusion at 1350 units/hr (no bolus) Check heparin level in 8hrs and daily while on heparin Daily PT/INR,  CBC. Warfarin on hold.  Armanda Bern, PharmD, BCPS 10/25/2023,8:24 PM  Please check AMION for all [facility] phone numbers After 10:00 PM, call Main Pharmacy [phone]  ADDENDUM: Notified by RN that VVS instructed her to hold heparin overnight due to hematoma at sheath site. Will f/u 4/18 for anticoagulation plans.  Armanda Bern, PharmD, BCPS 10/25/2023 8:42 PM

## 2023-10-25 NOTE — Progress Notes (Addendum)
  Progress Note    10/25/2023 8:24 AM * No surgery found *  Subjective:  no complaints    Vitals:   10/25/23 0759 10/25/23 0809  BP: 128/72   Pulse:  82  Resp: 19 16  Temp: 97.6 F (36.4 C)   SpO2:  93%    Physical Exam: General:  resting comfortably Lungs:  nonlabored Extremities:  dry gangrene of left 4th and 5th toes  CBC    Component Value Date/Time   WBC 11.4 (H) 10/25/2023 0424   RBC 3.79 (L) 10/25/2023 0424   HGB 11.7 (L) 10/25/2023 0424   HGB 13.2 09/06/2023 1124   HCT 36.3 (L) 10/25/2023 0424   HCT 39.5 09/06/2023 1124   PLT 372 10/25/2023 0424   PLT 254 09/06/2023 1124   MCV 95.8 10/25/2023 0424   MCV 95 09/06/2023 1124   MCH 30.9 10/25/2023 0424   MCHC 32.2 10/25/2023 0424   RDW 12.2 10/25/2023 0424   RDW 11.5 (L) 09/06/2023 1124   LYMPHSABS 1.5 10/25/2023 0424   LYMPHSABS 1.0 09/06/2023 1124   MONOABS 1.0 10/25/2023 0424   EOSABS 0.2 10/25/2023 0424   EOSABS 0.1 09/06/2023 1124   BASOSABS 0.0 10/25/2023 0424   BASOSABS 0.0 09/06/2023 1124    BMET    Component Value Date/Time   NA 138 10/25/2023 0424   NA 142 09/06/2023 1124   K 3.9 10/25/2023 0424   CL 103 10/25/2023 0424   CO2 20 (L) 10/25/2023 0424   GLUCOSE 87 10/25/2023 0424   BUN 11 10/25/2023 0424   BUN 11 09/06/2023 1124   CREATININE 1.18 10/25/2023 0424   CALCIUM 8.7 (L) 10/25/2023 0424   GFRNONAA >60 10/25/2023 0424   GFRAA 74 08/10/2020 1150    INR    Component Value Date/Time   INR 1.4 (H) 10/25/2023 0424   INR 2.3 (H) 09/21/2023 1131     Intake/Output Summary (Last 24 hours) at 10/25/2023 0824 Last data filed at 10/25/2023 1610 Gross per 24 hour  Intake 603 ml  Output 400 ml  Net 203 ml      Assessment/Plan:  73 y.o. male with critical limb ischemia of LLE   - He has no complaints this morning.  He denies any pain currently in the left foot -He is currently scheduled for left lower extremity angiogram in the Cath Lab today. -He has been n.p.o. past  midnight.  He remains agreeable to intervention -Will plan for left lower extremity angiogram today in efforts to improve blood flow to the left foot and improve chances of healing future amputation   Loel Dubonnet, PA-C [facility] [phone] 10/25/2023 8:24 AM   VASCULAR STAFF ADDENDUM: I have independently interviewed and examined the patient. I agree with the above.  Plan for left lower extremity angiogram today.  Patient with multilevel disease.  I discussed his case with my partner Dr. Chestine Spore. May require open surgery versus primary BKA.  He has severe calcific disease throughout the left lower extremity which will make any lower extremity amputation very difficult to heal.  In talking with Dr. Lajoyce Corners.  Even with inline flow to the foot, a TMA will be difficult to salvage.  Victorino Sparrow MD [facility] Phone Number: [phone] 10/25/2023 10:33 AM

## 2023-10-25 NOTE — Progress Notes (Signed)
 During post pull site check at 1913, pt noted to be mildly tachycardic with decreased BP.  On exam, groin site was grossly saturated with blood.  Manual pressure immediately applied, Cath Lab supervisor notified, pt placed in trendelenburg position.  Manual pressure held for additional 30 minutes.  On completion, site noted to be tender, slightly ecchymotic, hematoma noted to be lightly palpable even after manual pressure, but over the course of monitoring remained stable in size as of this note.  The patient was cleaned, linens changed, and new dressing applied.  MD on call was notified of events, no new orders given.  Will continue to monitor.

## 2023-10-25 NOTE — Progress Notes (Signed)
 PHARMACY - ANTICOAGULATION  Pharmacy Consult for Heparin Indication: atrial fibrillation and limb ischemia Brief A/P: Heparin level subtherapeutic Increase Heparin rate   Allergies  Allergen Reactions   Shellfish Allergy Nausea And Vomiting and Other (See Comments)    Shrimp, especially    Patient Measurements: Height: 5\' 10"  (177.8 cm) Weight: 84.1 kg (185 lb 6.5 oz) IBW/kg (Calculated) : 73 HEPARIN DW (KG): 86.2  Vital Signs: Temp: 97.6 F (36.4 C) (04/17 0414) Temp Source: Oral (04/17 0414) BP: 116/85 (04/17 0414) Pulse Rate: 93 (04/17 0414)  Labs: Recent Labs    10/23/23 0402 10/24/23 0350 10/24/23 2024 10/25/23 0424  HGB 11.4* 11.0*  --  11.7*  HCT 35.1* 34.7*  --  36.3*  PLT 372 345  --  372  LABPROT 30.8* 24.5* 19.7* 17.4*  INR 2.9* 2.2* 1.7* 1.4*  HEPARINUNFRC  --   --   --  0.16*  CREATININE 1.27* 1.36*  --  1.18    Estimated Creatinine Clearance: 60.1 mL/min (by C-G formula based on SCr of 1.18 mg/dL).  Assessment: 73 yo male with h/o Afib, Coumadin on hold while awaiting angiogram today, for heparin  Goal of Therapy:  Heparin level 0.3-0.7 units/ml Monitor platelets by anticoagulation protocol: Yes   Plan:  Increase Heparin 1350 units/hr Follow up after procedure today  Claudine Cullens, PharmD, BCPS

## 2023-10-25 NOTE — Progress Notes (Signed)
 Physical Therapy Treatment Patient Details Name: Jason Spencer MRN: [id number] DOB: 1950-10-13 Today's Date: 10/25/2023   History of Present Illness 73 y.o. male admitted 4/13  presenting with worsening left 4th and 5th toe pain. Osteomyelitis, amputation planned for 10/24/23.   PMH: hypertension, hyperlipidemia, atrial fibrillation/flutter, CAD, COPD, QT prolongation, dCHF, alcohol use, anxiety, pleural effusion    PT Comments  Patient continues with pain on L foot and limiting ambulation as a result.  For angiogram today though not till 2pm.  Patient donned shoes to see if improved tolerance though not much further ambulation.  Could benefit from post-op hard bottom shoe potentially.  PT will continue to follow.  Likely to need some sort of post-acute inpatient rehab if his sister remains hospitalized and pending further intervention on his foot.  PT will continue to follow while in the acute setting.     If plan is discharge home, recommend the following: A little help with walking and/or transfers;A little help with bathing/dressing/bathroom;Assistance with cooking/housework;Assist for transportation;Help with stairs or ramp for entrance   Can travel by private vehicle     Yes  Equipment Recommendations  Rolling walker (2 wheels);BSC/3in1    Recommendations for Other Services       Precautions / Restrictions Precautions Precautions: Fall Recall of Precautions/Restrictions: Intact Restrictions LLE Weight Bearing Per Provider Order: Weight bearing as tolerated     Mobility  Bed Mobility Overal bed mobility: Modified Independent                  Transfers Overall transfer level: Needs assistance Equipment used: Rolling walker (2 wheels) Transfers: Sit to/from Stand Sit to Stand: Contact guard assist           General transfer comment: increased time to stand, patient hesitant due to L foot pain    Ambulation/Gait Ambulation/Gait assistance: Contact guard  assist, Supervision Gait Distance (Feet): 8 Feet Assistive device: Rolling walker (2 wheels) Gait Pattern/deviations: Step-to pattern, Decreased stride length, Decreased weight shift to left, Antalgic       General Gait Details: donned shoes on EOB though still only agreeable to walk to sink, stood while PT adjusting/cleaning bed   Stairs             Wheelchair Mobility     Tilt Bed    Modified Rankin (Stroke Patients Only)       Balance Overall balance assessment: Needs assistance   Sitting balance-Leahy Scale: Good Sitting balance - Comments: on EOB donning socks and shoes     Standing balance-Leahy Scale: Poor Standing balance comment: standing with RW though no external physical support needed                            Communication Communication Communication: No apparent difficulties  Cognition Arousal: Alert Behavior During Therapy: WFL for tasks assessed/performed   PT - Cognitive impairments: No apparent impairments                         Following commands: Intact      Cueing Cueing Techniques: Verbal cues  Exercises      General Comments General comments (skin integrity, edema, etc.): VSS with mobility in the room; cleaned up large amount of dead skin off floor and bed; patient with severe psoriasis patches on both lower legs and reports MD to order cream for him as not using what he normally does at home.  RN  aware.      Pertinent Vitals/Pain Pain Assessment Faces Pain Scale: Hurts whole lot Pain Location: left toes Pain Descriptors / Indicators: Aching, Discomfort, Grimacing, Guarding Pain Intervention(s): Monitored during session, Repositioned, Limited activity within patient's tolerance    Home Living                          Prior Function            PT Goals (current goals can now be found in the care plan section) Progress towards PT goals: Progressing toward goals    Frequency    Min  2X/week      PT Plan      Co-evaluation              AM-PAC PT "6 Clicks" Mobility   Outcome Measure  Help needed turning from your back to your side while in a flat bed without using bedrails?: None Help needed moving from lying on your back to sitting on the side of a flat bed without using bedrails?: None Help needed moving to and from a bed to a chair (including a wheelchair)?: A Little Help needed standing up from a chair using your arms (e.g., wheelchair or bedside chair)?: A Little Help needed to walk in hospital room?: Total Help needed climbing 3-5 steps with a railing? : Total 6 Click Score: 16    End of Session   Activity Tolerance: Patient limited by pain Patient left: in bed;with call bell/phone within reach   PT Visit Diagnosis: Muscle weakness (generalized) (M62.81);Unsteadiness on feet (R26.81)     Time: 1002-1019 PT Time Calculation (min) (ACUTE ONLY): 17 min  Charges:    $Gait Training: 8-22 mins PT General Charges $$ ACUTE PT VISIT: 1 Visit                     Abigail Hoff, PT Acute Rehabilitation Services Office:[phone] 10/25/2023    Marley Simmers 10/25/2023, 10:59 AM

## 2023-10-25 NOTE — Progress Notes (Signed)
 On exam, pt noted to have small hematoma with palpable borders around sheathe site.  Manual pressure held for 10 minutes.  Pain medication given for patient report of 7/10 pain.  Post manual pressure, area soft.  No palpable hematoma noted.  ACT 199.  Will continue to monitor.

## 2023-10-25 NOTE — Op Note (Signed)
 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  Ultrasound-guided access right common femoral artery 2.  Aortogram with catheter selection of aorta 3.  Left lower extremity arteriogram with catheter selection of the left peroneal artery 4.  Shockwave lithotripsy left SFA and above-knee popliteal artery (4 mm x 80 mm shockwave x 400 pulses) 5.  Shockwave lithotripsy of the left behind the knee popliteal artery, below-knee popliteal artery, TP trunk, proximal peroneal artery (3 mm x 80 mm shockwave x 400 pulses) 6.  Drug-coated balloon angioplasty left popliteal artery (4 mm x 80 mm drug-coated Ranger) 7.  Angioplasty and stent of the left SFA and above-knee popliteal artery (6 mm x 150 mm coated Eluvia x 2 and 6 mm x 60 mm Eluvia all postdilated with a 5 mm Mustang) 8.  137 minutes monitored moderate conscious sedation  Indications: 73 year old male seen with critical limb ischemia with tissue loss in the left lower extremity.  He presents for aortogram, lower extremity arteriogram with possible intervention with a focus on the left leg.  Findings:   Ultrasound-guided access right common femoral artery.  Aortogram showed patent renal arteries bilaterally with a patent infrarenal aorta.  On the left there was calcification in the common iliac but otherwise no flow-limiting stenosis in the left common iliac or external iliac.  There was some common femoral disease that did not appear flow-limiting.  The profunda was patent.  Patient had diffusely diseased SFA with long segment significant high-grade greater than 80% flow-limiting calcified stenosis with a distal chronic total occlusion.  He reconstituted above-knee popliteal artery and then had a high-grade subtotal occlusion  behind the knee in the popliteal artery.  Dominant runoff was in the peroneal artery.  The anterior tibial was patent although there was some disease proximally near the ostium.  Ultimately I was able to get through the left SFA popliteal disease antegrade and into the peroneal artery although this was very challenging.  Initially performed shockwave lithotripsy of the mid to distal SFA and above-knee popliteal artery with 4 mm shockwave lithotripsy balloon x 400 pulses throughout the segment.  I could not get this to track to the popliteal artery behind the knee due to a subtotal occlusion.  I then used a 3 mm shockwave lithotripsy balloon and treated the behind the knee popliteal artery below-knee popliteal artery as well as the TP trunk and proximal peroneal artery with 400 pulses as well.  I then elected to use drug-coated balloon behind the knee for the subtotal occlusion as there was still significant residual flow-limiting stenosis with a 4 mm Ranger for 3 minutes.  This showed much better results.  Finally I elected to stent the entire SFA above-knee popliteal artery with 6 mm Eluvia's postdilated with a 5 mm Mustang.  Patient now has widely patent stents with no evidence of significant residual stenosis behind the knee and two-vessel runoff.   Procedure:  The patient was identified in the holding area and taken to room 8.  The patient was then placed supine on the table and prepped and draped in the usual sterile fashion.  A time out was called.  The patient received Versed and fentanyl for conscious moderate sedation.  Vital signs were monitored including heart rate, respiratory rate, oxygenation and blood pressure.  I was present for all moderate  sedation.  Ultrasound was used to evaluate the right common femoral artery.  It was patent .  A digital ultrasound image was acquired.  A micropuncture needle was used to access the right common femoral artery under ultrasound guidance.  An 018 wire was  advanced without resistance and a micropuncture sheath was placed.  The 018 wire was removed and a benson wire was placed.  The micropuncture sheath was exchanged for a 5 french sheath.  An omniflush catheter was advanced over the wire to the level of L-1.  An abdominal angiogram was obtained.  Next, using the omniflush catheter and a benson wire, the aortic bifurcation was crossed and the catheter was placed into theleft external iliac artery and left runoff was obtained.    Ultimately elected for intervention and I used a Glidewire advantage and upsized to a long 6 [place] catapult sheath in the right groin over the aortic bifurcation into the left common femoral.  Patient was given 100 units/kg IV heparin.  Additional heparin was given during the case.  I then used a Glidewire advantage with quick cross catheter and I was able to get through the left SFA disease including the chronic total occlusion and this was challenging.  I then met additional technical issue behind the knee as initially I could not get through the popliteal lesion and the wire wanted to go out collateral vessels.  Finally got through the subtotal occlusion behind the knee and got a wire down the peroneal artery.  I then selected a 4 mm x 80 mm shockwave lithotripsy balloon and exchanged for a [place] core wire for an 014 system.  I performed shockwave of the above-knee popliteal artery into the SFA with inflation to 4 atm and using at least 80-120 pulses in each segment until the entire 400 pulses was used.  This balloon would not track behind the knee due to the subtotal occlusion.  Ultimately I did briefly lose wire access and then spent a lot of time getting back through the popliteal artery behind the knee and finally got my wire back down the peroneal again.  I elected to use a smaller shockwave balloon and selected a 3 mm x 80 mm shockwave balloon and I was able to get this to track through the subtotal occlusion in the popliteal  artery behind the knee all the way into the proximal peroneal.  I then inflated this to 3 atm and again used 80-120 pulses for the proximal peroneal artery, TP trunk and below-knee popliteal artery and popliteal artery behind the knee.  There was still significant residual disease in the popliteal artery behind the knee so I then used a 4 mm x 80 mm drug-coated Ranger for a long 3-minute inflation.  I was much happier with these results and no significant residual stenosis.  I then elected to stent the entire SFA above-knee popliteal artery on the left with 6 mm Eluvia's.  I then placed a 6 mm x 150 mm Eluvia x 2 and a 6 mm x 60 mm Eluvia all postdilated with 5 mm Mustang's.  Patient now has widely patent stents with a patent popliteal artery behind the knee and two-vessel runoff without flow-limiting stenosis.  All of his arteries are calcified and heavily diseased.  Short 6 French sheath was placed in the right groin after removed wires and catheters.  To be taken holding for sheath removal.  Plan: Excellent results.  Optimized from vascular surgery.  Aspirin, plavix, statin.   Veryl Gottron  Sherryn Donalds, MD [facility] [place] Office: [phone]  Young Hensen

## 2023-10-25 NOTE — Progress Notes (Signed)
 ACT 187, Clark, MD notified, received verbal order OK to pull sheathe now.

## 2023-10-26 ENCOUNTER — Encounter (HOSPITAL_COMMUNITY): Payer: Self-pay | Admitting: Vascular Surgery

## 2023-10-26 ENCOUNTER — Other Ambulatory Visit (HOSPITAL_COMMUNITY): Payer: Self-pay

## 2023-10-26 ENCOUNTER — Telehealth (HOSPITAL_COMMUNITY): Payer: Self-pay | Admitting: Pharmacy Technician

## 2023-10-26 DIAGNOSIS — I739 Peripheral vascular disease, unspecified: Secondary | ICD-10-CM | POA: Diagnosis not present

## 2023-10-26 DIAGNOSIS — I4892 Unspecified atrial flutter: Secondary | ICD-10-CM | POA: Diagnosis not present

## 2023-10-26 DIAGNOSIS — M86172 Other acute osteomyelitis, left ankle and foot: Principal | ICD-10-CM

## 2023-10-26 DIAGNOSIS — F411 Generalized anxiety disorder: Secondary | ICD-10-CM | POA: Diagnosis not present

## 2023-10-26 DIAGNOSIS — M869 Osteomyelitis, unspecified: Secondary | ICD-10-CM | POA: Diagnosis not present

## 2023-10-26 DIAGNOSIS — I96 Gangrene, not elsewhere classified: Secondary | ICD-10-CM | POA: Diagnosis not present

## 2023-10-26 LAB — CBC WITH DIFFERENTIAL/PLATELET
Abs Immature Granulocytes: 0.15 10*3/uL — ABNORMAL HIGH (ref 0.00–0.07)
Basophils Absolute: 0 10*3/uL (ref 0.0–0.1)
Basophils Relative: 0 %
Eosinophils Absolute: 0 10*3/uL (ref 0.0–0.5)
Eosinophils Relative: 0 %
HCT: 32 % — ABNORMAL LOW (ref 39.0–52.0)
Hemoglobin: 10.2 g/dL — ABNORMAL LOW (ref 13.0–17.0)
Immature Granulocytes: 1 %
Lymphocytes Relative: 8 %
Lymphs Abs: 1.1 10*3/uL (ref 0.7–4.0)
MCH: 31.2 pg (ref 26.0–34.0)
MCHC: 31.9 g/dL (ref 30.0–36.0)
MCV: 97.9 fL (ref 80.0–100.0)
Monocytes Absolute: 1.1 10*3/uL — ABNORMAL HIGH (ref 0.1–1.0)
Monocytes Relative: 8 %
Neutro Abs: 12.2 10*3/uL — ABNORMAL HIGH (ref 1.7–7.7)
Neutrophils Relative %: 83 %
Platelets: 367 10*3/uL (ref 150–400)
RBC: 3.27 MIL/uL — ABNORMAL LOW (ref 4.22–5.81)
RDW: 12.4 % (ref 11.5–15.5)
WBC: 14.7 10*3/uL — ABNORMAL HIGH (ref 4.0–10.5)
nRBC: 0 % (ref 0.0–0.2)

## 2023-10-26 LAB — BASIC METABOLIC PANEL WITH GFR
Anion gap: 15 (ref 5–15)
BUN: 15 mg/dL (ref 8–23)
CO2: 20 mmol/L — ABNORMAL LOW (ref 22–32)
Calcium: 8.5 mg/dL — ABNORMAL LOW (ref 8.9–10.3)
Chloride: 103 mmol/L (ref 98–111)
Creatinine, Ser: 1.49 mg/dL — ABNORMAL HIGH (ref 0.61–1.24)
GFR, Estimated: 50 mL/min — ABNORMAL LOW (ref >60)
Glucose, Bld: 101 mg/dL — ABNORMAL HIGH (ref 70–99)
Potassium: 4 mmol/L (ref 3.5–5.1)
Sodium: 138 mmol/L (ref 135–145)

## 2023-10-26 LAB — LIPID PANEL
Cholesterol: 117 mg/dL (ref 0–200)
HDL: 35 mg/dL — ABNORMAL LOW (ref >40)
LDL Cholesterol: 48 mg/dL (ref 0–99)
Total CHOL/HDL Ratio: 3.3 ratio
Triglycerides: 168 mg/dL — ABNORMAL HIGH (ref <150)
VLDL: 34 mg/dL (ref 0–40)

## 2023-10-26 LAB — HEPARIN LEVEL (UNFRACTIONATED)
Heparin Unfractionated: <0.1 [IU]/mL — ABNORMAL LOW (ref 0.30–0.70)
Heparin Unfractionated: 0.25 [IU]/mL — ABNORMAL LOW (ref 0.30–0.70)

## 2023-10-26 LAB — PROTIME-INR
INR: 1.4 — ABNORMAL HIGH (ref 0.8–1.2)
Prothrombin Time: 17.1 s — ABNORMAL HIGH (ref 11.4–15.2)

## 2023-10-26 LAB — CULTURE, BLOOD (ROUTINE X 2)
Culture: NO GROWTH
Culture: NO GROWTH
Special Requests: ADEQUATE

## 2023-10-26 LAB — MAGNESIUM: Magnesium: 2.1 mg/dL (ref 1.7–2.4)

## 2023-10-26 MED ORDER — FOLIC ACID 1 MG PO TABS
1.0000 mg | ORAL_TABLET | Freq: Every day | ORAL | Status: DC
  Administered 2023-10-26 – 2023-11-06 (×12): 1 mg via ORAL
  Filled 2023-10-26 (×12): qty 1

## 2023-10-26 MED ORDER — ADULT MULTIVITAMIN W/MINERALS CH
1.0000 | ORAL_TABLET | Freq: Every day | ORAL | Status: DC
  Administered 2023-10-26 – 2023-11-06 (×12): 1 via ORAL
  Filled 2023-10-26 (×12): qty 1

## 2023-10-26 MED ORDER — CLOPIDOGREL BISULFATE 75 MG PO TABS
75.0000 mg | ORAL_TABLET | Freq: Every day | ORAL | Status: DC
Start: 2023-10-26 — End: 2023-10-26

## 2023-10-26 MED ORDER — CLOPIDOGREL BISULFATE 75 MG PO TABS: 300.0000 mg | ORAL_TABLET | Freq: Once | ORAL | Status: DC

## 2023-10-26 MED ORDER — CLOPIDOGREL BISULFATE 75 MG PO TABS
75.0000 mg | ORAL_TABLET | Freq: Every day | ORAL | Status: DC
  Administered 2023-10-26 – 2023-11-06 (×12): 75 mg via ORAL
  Filled 2023-10-26 (×12): qty 1

## 2023-10-26 MED ORDER — SODIUM CHLORIDE 0.9 % IV SOLN: 250.0000 mL | INTRAVENOUS | Status: DC | PRN

## 2023-10-26 MED ORDER — HEPARIN (PORCINE) 25000 UT/250ML-% IV SOLN
1500.0000 [IU]/h | INTRAVENOUS | Status: DC
Start: 2023-10-26 — End: 2023-10-28
  Administered 2023-10-26: 1350 [IU]/h via INTRAVENOUS
  Administered 2023-10-27 – 2023-10-28 (×2): 1450 [IU]/h via INTRAVENOUS
  Filled 2023-10-26 (×3): qty 250

## 2023-10-26 MED ORDER — THIAMINE MONONITRATE 100 MG PO TABS
100.0000 mg | ORAL_TABLET | Freq: Every day | ORAL | Status: DC
  Administered 2023-10-26 – 2023-11-06 (×12): 100 mg via ORAL
  Filled 2023-10-26 (×12): qty 1

## 2023-10-26 MED ORDER — SODIUM CHLORIDE 0.9% FLUSH
3.0000 mL | INTRAVENOUS | Status: DC | PRN
  Administered 2023-10-29: 3 mL via INTRAVENOUS

## 2023-10-26 MED ORDER — SODIUM CHLORIDE 0.9% FLUSH
3.0000 mL | Freq: Two times a day (BID) | INTRAVENOUS | Status: DC
  Administered 2023-10-26 – 2023-11-06 (×21): 3 mL via INTRAVENOUS

## 2023-10-26 NOTE — Plan of Care (Signed)
  Problem: Education: Goal: Knowledge of General Education information will improve Description: Including pain rating scale, medication(s)/side effects and non-pharmacologic comfort measures Outcome: Progressing   Problem: Health Behavior/Discharge Planning: Goal: Ability to manage health-related needs will improve Outcome: Progressing   Problem: Clinical Measurements: Goal: Ability to maintain clinical measurements within normal limits will improve Outcome: Progressing Goal: Will remain free from infection Outcome: Progressing Goal: Diagnostic test results will improve Outcome: Progressing Goal: Respiratory complications will improve Outcome: Progressing Goal: Cardiovascular complication will be avoided Outcome: Progressing   Problem: Pain Managment: Goal: General experience of comfort will improve and/or be controlled Outcome: Progressing   Problem: Safety: Goal: Ability to remain free from injury will improve Outcome: Progressing   Problem: Skin Integrity: Goal: Risk for impaired skin integrity will decrease Outcome: Progressing

## 2023-10-26 NOTE — Progress Notes (Signed)
 Patient ID: Jason Spencer, male   DOB: March 12, 1951, 73 y.o.   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  Reviewed the stent of the gangrenous changes that extends proximal to the 4th and 5th metatarsal heads laterally.  Feel the patient's best option is to proceed with a transmetatarsal amputation.  Will plan to post surgery for Wednesday.

## 2023-10-26 NOTE — Progress Notes (Addendum)
 Progress Note    10/26/2023 8:49 AM 1 Day Post-Op  Subjective: He feels better this morning.  He is slightly sore in the right groin    Vitals:   10/26/23 0344 10/26/23 0756  BP: 126/69 (!) 109/91  Pulse: 93 94  Resp: 20 16  Temp: 97.8 F (36.6 C) 97.7 F (36.5 C)  SpO2: 97% 97%    Physical Exam: General: Sitting up in bed, NAD Lungs: Nonlabored Incisions: Right groin cath site with some ecchymosis and small hematoma present, no active bleeding Extremities: Brisk left DP Doppler signal.  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0 10/26/2023 0418   BASOSABS 0.0 09/06/2023 1124    BMET    Component Value Date/Time   NA 138 10/26/2023 0418   NA 142 09/06/2023 1124   K 4.0 10/26/2023 0418   CL 103 10/26/2023 0418   CO2 20 (L) 10/26/2023 0418   GLUCOSE 101 (H) 10/26/2023 0418   BUN 15 10/26/2023 0418   BUN 11 09/06/2023 1124   CREATININE 1.49 (H) 10/26/2023 0418   CALCIUM  8.5 (L) 10/26/2023 0418   GFRNONAA 50 (L) 10/26/2023 0418   GFRAA 74 08/10/2020 1150    INR    Component Value Date/Time   INR 1.4 (H) 10/26/2023 0418   INR 2.3 (H) 09/21/2023 1131     Intake/Output Summary (Last 24 hours) at 10/26/2023 0849 Last data filed at 10/26/2023 0345 Gross per 24 hour  Intake --  Output 300 ml  Net -300 ml       Assessment/Plan:  73 y.o. male is 1 day postop, s/p: LLE angiogram with shockwave lithotripsy, angioplasty, and stenting of the left SFA/popliteal arteries   - The patient had bleeding issues from the right groin site after sheath pull last night.  Pressure was held for over 30 minutes.  His bleeding stopped and he was noted to have some ecchymosis and small hematoma at the right groin after manual pressure was discontinued.  -On exam this morning the patient's new right groin dressing is dry.  He has a small hematoma in the right groin with some ecchymosis.  This is stable in size from overnight -His left lower extremity is now maximally revascularized after angiogram yesterday -On exam he has a brisk left DP Doppler signal.  He has dry gangrenous changes to his left 4th and 5th toes and erythema to the dorsum of his foot -Okay to proceed with intervention/amputation with orthopedics -Okay to resume heparin  gtt for a-fib.  Recommend continued close monitoring of the right groin -Continue aspirin , Plavix , and statin for stent patency.  Will arrange follow-up with our office in 4 to 6 weeks with LLE arterial duplex  and ABIs   Ahmed Holster, PA-C [facility] [phone] 10/26/2023 8:49 AM   VASCULAR STAFF ADDENDUM: I have independently interviewed and examined the patient. I agree with the above.  Patient had an excellent peroneal signal today.  He has been optimized from a vascular surgery perspective.  Appreciate help from Dr. Harden as he will require orthopedic intervention.  He is aware that if distal amputation does not heal, he will likely require below-knee amputation. Please continue aspirin  Plavix , statin.  Fonda FORBES Rim MD [facility] Phone Number: [phone] 10/26/2023 11:54 AM

## 2023-10-26 NOTE — Telephone Encounter (Signed)
 Pharmacy Patient Advocate Encounter  Insurance verification completed.    The patient is insured through [employer]. Patient has Medicare and is not eligible for a copay card, but may be able to apply for patient assistance or Medicare RX Payment Plan (Patient Must reach out to their plan, if eligible for payment plan), if available.    Ran test claim for ELIQUIS 5MG  TABLETS and the current 30 day co-pay is $297.00.  Ran test claim for XARELTO 10MG  TABLETS and the current 30 day co-pay is $297.00.  Copay applied to and meets $250.00 deductible. $47.00 after the deductible.   This test claim was processed through [facility]- copay amounts may vary at other pharmacies due to pharmacy/plan contracts, or as the patient moves through the different stages of their insurance plan.

## 2023-10-26 NOTE — Progress Notes (Signed)
 PHARMACY - ANTICOAGULATION CONSULT NOTE  Pharmacy Consult for Heparin   (warfarin on hold) Indication: atrial fibrillation and limb ischemia  Allergies  Allergen Reactions   Shellfish Allergy Nausea And Vomiting and Other (See Comments)    Shrimp, especially    Patient Measurements: Height: 5\' 10"  (177.8 cm) Weight: 79.4 kg (175 lb 0.7 oz) IBW/kg (Calculated) : 73 HEPARIN  DW (KG): 86.2  Vital Signs: Temp: 98.2 F (36.8 C) (04/18 1930) Temp Source: Oral (04/18 1930) BP: 107/61 (04/18 1930) Pulse Rate: 85 (04/18 1930)  Labs: Recent Labs    10/24/23 0350 10/24/23 2024 10/25/23 0424 10/25/23 1951 10/26/23 0418 10/26/23 0742 10/26/23 2034  HGB 11.0*  --  11.7* 11.6* 10.2*  --   --   HCT 34.7*  --  36.3* 34.0* 32.0*  --   --   PLT 345  --  372  --  367  --   --   LABPROT 24.5* 19.7* 17.4*  --  17.1*  --   --   INR 2.2* 1.7* 1.4*  --  1.4*  --   --   HEPARINUNFRC  --   --  0.16*  --   --  <0.10* 0.25*  CREATININE 1.36*  --  1.18 1.40* 1.49*  --   --     Estimated Creatinine Clearance: 47.6 mL/min (A) (by C-G formula based on SCr of 1.49 mg/dL (H)).  Assessment: 35 yom with a history of HF, AF on warfarin, CAD, COPD, PAD . Patient presenting with toe pain, reports 5th and 4th digit on his left foot gradually started becoming black proximately 2 weeks ago. Heparin  per pharmacy consult placed for atrial fibrillation and limb ischemia .  Holding warfarin.  He is s/p drug-coated balloon angioplasty/stent on 4/17 and heparin  was stopped last night due to a hematoma s/p sheath pull. Plans are now to resume heparin  -Last heparin  rate was 1350 units/hr -hg= 10.2  PTA Warfarin: 7.5 mg TTSS, 5 mg MWF  PM: heparin  level 0.25 (subtherapeutic) on heparin  1350 units/hr. No issues with the infusion or bleeding reported per RN.  Goal of Therapy:  Heparin  level 0.3-0.7 units/ml Monitor platelets by anticoagulation protocol: Yes   Plan:  Increase heparin  infusion to 1450 units/hr  (no bolus) Check heparin  level in 8hrs and daily while on heparin  Daily PT/INR, CBC. Warfarin on hold for amputation next week.  Armanda Bern, PharmD, BCPS Clinical Pharmacist **Pharmacist phone directory can now be found on [email or web address] ([email or web address]).  Listed under [facility].

## 2023-10-26 NOTE — Progress Notes (Signed)
 Mobility Specialist Progress Note;   10/26/23 0910  Mobility  Activity Refused mobility  Mobility Specialist Start Time (ACUTE ONLY) 0910   Pt refusing mobility at this time. States he is having too much pain in BLE and can't even dangle on EOB. Pt left with all needs met.   Janit Meline Mobility Specialist Please contact via SecureChat or [employer] [phone]

## 2023-10-26 NOTE — H&P (View-Only) (Signed)
 Patient ID: Jason Spencer, male   DOB: 11/16/1950, 73 y.o.   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  Reviewed the stent of the gangrenous changes that extends proximal to the 4th and 5th metatarsal heads laterally.  Feel the patient's best option is to proceed with a transmetatarsal amputation.  Will plan to post surgery for Wednesday.

## 2023-10-26 NOTE — Progress Notes (Signed)
 OT Cancellation Note  Patient Details Name: Jason Spencer MRN: [id number] DOB: Dec 18, 1950   Cancelled Treatment:    Reason Eval/Treat Not Completed: Patient declined, no reason specified (Attempted to engage with pt for therapy, he declined all efforts of mobility and prefers to remain sedentary secondary to pain in foot. OT will follow-up with pt as able)  10/26/2023  AB, OTR/L  Acute Rehabilitation Services  Office: [phone]   Curtistine JONETTA Das 10/26/2023, 10:57 AM

## 2023-10-26 NOTE — Progress Notes (Signed)
 PHARMACY - ANTICOAGULATION CONSULT NOTE  Pharmacy Consult for Heparin   (warfarin on hold) Indication: atrial fibrillation and limb ischemia  Allergies  Allergen Reactions   Shellfish Allergy Nausea And Vomiting and Other (See Comments)    Shrimp, especially    Patient Measurements: Height: 5\' 10"  (177.8 cm) Weight: 79.4 kg (175 lb 0.7 oz) IBW/kg (Calculated) : 73 HEPARIN  DW (KG): 86.2  Vital Signs: Temp: 97.7 F (36.5 C) (04/18 0756) Temp Source: Oral (04/18 0756) BP: 109/91 (04/18 0756) Pulse Rate: 94 (04/18 0756)  Labs: Recent Labs    10/24/23 0350 10/24/23 2024 10/25/23 0424 10/25/23 1951 10/26/23 0418 10/26/23 0742  HGB 11.0*  --  11.7* 11.6* 10.2*  --   HCT 34.7*  --  36.3* 34.0* 32.0*  --   PLT 345  --  372  --  367  --   LABPROT 24.5* 19.7* 17.4*  --  17.1*  --   INR 2.2* 1.7* 1.4*  --  1.4*  --   HEPARINUNFRC  --   --  0.16*  --   --  <0.10*  CREATININE 1.36*  --  1.18 1.40* 1.49*  --     Estimated Creatinine Clearance: 47.6 mL/min (A) (by C-G formula based on SCr of 1.49 mg/dL (H)).  Assessment: 5 yom with a history of HF, AF on warfarin, CAD, COPD, PAD . Patient presenting with toe pain, reports 5th and 4th digit on his left foot gradually started becoming black proximately 2 weeks ago. Heparin  per pharmacy consult placed for atrial fibrillation and limb ischemia .  Holding warfarin.  He is s/p drug-coated balloon angioplasty/stent on 4/17 and heparin  was stopped last night due to a hematoma s/p sheath pull. Plans are now to resume heparin  -Last heparin  rate was 1350 units/hr -hg= 10.2  PTA Warfarin: 7.5 mg TTSS, 5 mg MWF  Goal of Therapy:  Heparin  level 0.3-0.7 units/ml Monitor platelets by anticoagulation protocol: Yes   Plan:  Resume heparin  infusion at 1350 units/hr (no bolus) Check heparin  level in 8hrs and daily while on heparin  Daily PT/INR, CBC. Warfarin on hold.  Baxter Limber, PharmD Clinical Pharmacist **Pharmacist phone directory  can now be found on [email or web address] ([email or web address]).  Listed under [facility].

## 2023-10-26 NOTE — Progress Notes (Signed)
 PROGRESS NOTE  Jason Spencer WUJ:[id number] DOB: 1950-12-23   PCP: Yevette Hem, FNP  Patient is from: Home  DOA: 10/21/2023 LOS: 5  Chief complaints Chief Complaint  Patient presents with   Circulatory Problem     Brief Narrative / Interim history: 73 year old M with PMH of COPD, CAD, atrial fibrillation/flutter on warfarin, HTN, HLD, EtOH use, anxiety, pleural effusion, psoriasis, PAD and tobacco use disorder presenting with progressive left 4th and 5th toe pain and discoloration for about 2 weeks, and admitted with critical limb ischemia with left 4th and 5th toe gangrene, and fifth metatarsal head osteomyelitis.   He had mild leukocytosis with elevated CRP to 9.9.  Has no fever.  Pro-Cal negative.  Foot x-ray showed subcutaneous gas about the head of the fifth metatarsal and proximal phalanx of left fifth toe with cortical loss medially from the fifth metatarsal head consistent with osteomyelitis.  Blood cultures obtained.  Patient was started on broad-spectrum antibiotics.  Orthopedic surgery and vascular surgery consulted.   CT angio showed severe bilateral multifocal high-grade stenosis from femoral arteries down, and aortic atherosclerosis.    INR reversed with vitamin K and he underwent LLE angiogram with shockwave lithotripsy, drug-coated balloon angioplasty and stent to left SFA and above knee left popliteal artery on 4/17.  Started on Plavix  and aspirin .  Remains on broad-spectrum antibiotics.  Vascular and orthopedic surgery on board.  Subjective: Seen and examined earlier this morning.  No major events overnight of this morning.  He reports some intermittent pain and tenderness in left foot.  No other complaints.  Objective: Vitals:   10/26/23 0015 10/26/23 0134 10/26/23 0344 10/26/23 0756  BP: 107/71 127/73 126/69 (!) 109/91  Pulse: 96 97 93 94  Resp:  20 20 16   Temp: 97.8 F (36.6 C) 97.8 F (36.6 C) 97.8 F (36.6 C) 97.7 F (36.5 C)  TempSrc: Oral Oral  Oral Oral  SpO2: 94% 98% 97% 97%  Weight:      Height:        Examination:  GENERAL: No apparent distress.  Nontoxic. HEENT: MMM.  Vision and hearing grossly intact.  NECK: Supple.  No apparent JVD.  RESP:  No IWOB.  Fair aeration bilaterally. CVS:  RRR. Heart sounds normal.  ABD/GI/GU: BS+. Abd soft, NTND.  MSK/EXT:  Moves extremities.  Dry gangrene in left 4th and 5th toe.  Faint DP pulses.  Significant muscle mass and subcu fat loss.  Erythema over the dorsum of left foot.  Dry skin in BLE.  See picture in media for more. SKIN: As above. NEURO: Awake, alert and oriented appropriately.  No apparent focal neuro deficit. PSYCH: Calm. Normal affect.   Consultants:  Orthopedic surgery Vascular surgery  Procedures: 4/17-LLE angiogram with shockwave lithotripsy, drug-coated balloon angioplasty and stent to left SFA and above knee left popliteal artery.  See op note for details.  Microbiology summarized: MRSA PCR screen nonreactive Blood cultures NGTD  Assessment and plan: Left lower extremity critical limb ischemia with 4th and 5th dry gangrene: Present on arrival Severe bilateral PAD-as noted on CT angio Left foot osteomyelitis: Present on arrival -Left foot x-ray and CT angio as above. -S/p LLE angiogram with shockwave lithotripsy, drug-coated balloon angioplasty and stent to left SFA and above knee left popliteal artery by Dr. Fulton Job on 4/17.  -Continue aspirin  and Plavix  -Agree with changing vancomycin  to Zyvox  given renal function -Continue IV Zosyn  -Continue home Crestor .  LDL only 48. -Emphasized the importance of smoking cessation -Orthopedic surgery, Dr.  Julio Ohm on board.  Will reevaluate   Chronic atrial fibrillation/flutter: Rate controlled.  On warfarin at home.  INR subtherapeutic after reversal for surgery -Resume anticoagulation with IV heparin  -Continue holding warfarin in anticipation for surgical intervention -May consider DOAC's if cost is not a barrier.  I do  not see a contraindication -Consider home metoprolol   AKI: Baseline Cr~1.0.  AKI could be due to IV contrast, IV antibiotics and lisinopril .  Seems to be plateauing. Recent Labs    01/08/23 1205 03/09/23 1153 09/06/23 1124 10/21/23 1327 10/22/23 0346 10/23/23 0402 10/24/23 0350 10/25/23 0424 10/25/23 1951 10/26/23 0418  BUN 15 20 11 9 8  7* 5* 11 13 15   CREATININE 1.32* 1.35* 0.97 1.04 1.19 1.27* 1.36* 1.18 1.40* 1.49*  - Agree with changing vancomycin  to Zyvox  - Continue holding lisinopril  - Avoid nephrotoxic meds - Continue monitoring  History of CAD: No cardiopulmonary symptoms. - Continue home metoprolol  and Crestor  - Now on aspirin  and Plavix  for PAD  Psoriasis: Did not follow-up with his dermatologist for a while - Continue clobetasol  cream twice daily   Essential hypertension: Normotensive for most part -Continue home amlodipine  and metoprolol  - Continue holding lisinopril   Chronic COPD: Stable - Continue home inhalers or hospital formulary -Encouraged smoking cessation  Alcohol abuse: No withdrawal symptoms.  CIWA 0. - Multivitamin, thiamine  and folic acid   Leukocytosis: Slightly worse likely demargination after surgery - Continue monitoring - Antibiotics as above  Body mass index is 25.12 kg/m.           DVT prophylaxis:  On full dose anticoagulation  Code Status: Full code Family Communication: None at bedside Level of care: Med-Surg Status is: Inpatient Remains inpatient appropriate because: Critical limb ischemia, osteomyelitis   Final disposition: Likely home   55 minutes with more than 50% spent in reviewing records, counseling patient/family and coordinating care.   Sch Meds:  Scheduled Meds:  amLODipine   5 mg Oral Daily   aspirin   81 mg Oral Daily   budeson-glycopyrrolate -formoterol   2 puff Inhalation BID   clobetasol  cream   Topical BID   clopidogrel   75 mg Oral Daily   folic acid   1 mg Oral Daily   linezolid   600 mg Oral  Q12H   metoprolol  succinate  100 mg Oral Daily   multivitamin with minerals  1 tablet Oral Daily   rosuvastatin   5 mg Oral Daily   sodium chloride  flush  3 mL Intravenous Q12H   sodium chloride  flush  3 mL Intravenous Q12H   thiamine   100 mg Oral Daily   Continuous Infusions:  sodium chloride      piperacillin -tazobactam 3.375 g (10/26/23 0554)   PRN Meds:.sodium chloride , acetaminophen  **OR** acetaminophen , acetaminophen , hydrALAZINE , HYDROcodone -acetaminophen , labetalol , morphine  injection, polyethylene glycol, sodium chloride  flush  Antimicrobials: Anti-infectives (From admission, onward)    Start     Dose/Rate Route Frequency Ordered Stop   10/22/23 1045  linezolid  (ZYVOX ) tablet 600 mg        600 mg Oral Every 12 hours 10/22/23 0954     10/22/23 0600  vancomycin  (VANCOCIN ) IVPB 1000 mg/200 mL premix  Status:  Discontinued        1,000 mg 200 mL/hr over 60 Minutes Intravenous Every 12 hours 10/21/23 1738 10/21/23 1743   10/21/23 2200  piperacillin -tazobactam (ZOSYN ) IVPB 3.375 g       Placed in "Followed by" Linked Group   3.375 g 12.5 mL/hr over 240 Minutes Intravenous Every 8 hours 10/21/23 1504     10/21/23 1745  linezolid  (ZYVOX ) IVPB 600 mg  Status:  Discontinued        600 mg 300 mL/hr over 60 Minutes Intravenous Every 12 hours 10/21/23 1744 10/22/23 0954   10/21/23 1530  clindamycin  (CLEOCIN ) IVPB 600 mg  Status:  Discontinued        600 mg 100 mL/hr over 30 Minutes Intravenous  Once 10/21/23 1523 10/21/23 1715   10/21/23 1515  vancomycin  (VANCOREADY) IVPB 1750 mg/350 mL  Status:  Discontinued        1,750 mg 175 mL/hr over 120 Minutes Intravenous  Once 10/21/23 1504 10/21/23 1743   10/21/23 1515  piperacillin -tazobactam (ZOSYN ) IVPB 3.375 g       Placed in "Followed by" Linked Group   3.375 g 100 mL/hr over 30 Minutes Intravenous  Once 10/21/23 1504 10/21/23 1804        I have personally reviewed the following labs and images: CBC: Recent Labs  Lab  10/21/23 1327 10/22/23 0346 10/23/23 0402 10/24/23 0350 10/25/23 0424 10/25/23 1951 10/26/23 0418  WBC 15.7* 11.2* 12.1* 10.5 11.4*  --  14.7*  NEUTROABS 12.5*  --  9.2* 7.9* 8.7*  --  12.2*  HGB 12.2* 11.9* 11.4* 11.0* 11.7* 11.6* 10.2*  HCT 38.7* 37.2* 35.1* 34.7* 36.3* 34.0* 32.0*  MCV 98.2 96.4 96.2 97.2 95.8  --  97.9  PLT 432* 385 372 345 372  --  367   BMP &GFR Recent Labs  Lab 10/22/23 0346 10/23/23 0402 10/24/23 0350 10/25/23 0424 10/25/23 1951 10/26/23 0418  NA 138 137 136 138 139 138  K 3.8 3.8 3.7 3.9 4.1 4.0  CL 100 104 106 103 108 103  CO2 25 23 22  20*  --  20*  GLUCOSE 89 89 88 87 110* 101*  BUN 8 7* 5* 11 13 15   CREATININE 1.19 1.27* 1.36* 1.18 1.40* 1.49*  CALCIUM  8.7* 8.3* 8.3* 8.7*  --  8.5*  MG  --  2.0 1.9 2.0  --  2.1   Estimated Creatinine Clearance: 47.6 mL/min (A) (by C-G formula based on SCr of 1.49 mg/dL (H)). Liver & Pancreas: Recent Labs  Lab 10/21/23 1327 10/22/23 0346  AST 19 20  ALT 10 12  ALKPHOS 51 49  BILITOT 0.4 0.6  PROT 8.2* 7.4  ALBUMIN 2.4* 2.2*   No results for input(s): "LIPASE", "AMYLASE" in the last 168 hours. No results for input(s): "AMMONIA" in the last 168 hours. Diabetic: No results for input(s): "HGBA1C" in the last 72 hours. No results for input(s): "GLUCAP" in the last 168 hours. Cardiac Enzymes: No results for input(s): "CKTOTAL", "CKMB", "CKMBINDEX", "TROPONINI" in the last 168 hours. No results for input(s): "PROBNP" in the last 8760 hours. Coagulation Profile: Recent Labs  Lab 10/23/23 0402 10/24/23 0350 10/24/23 2024 10/25/23 0424 10/26/23 0418  INR 2.9* 2.2* 1.7* 1.4* 1.4*   Thyroid  Function Tests: No results for input(s): "TSH", "T4TOTAL", "FREET4", "T3FREE", "THYROIDAB" in the last 72 hours. Lipid Profile: Recent Labs    10/26/23 0418  CHOL 117  HDL 35*  LDLCALC 48  TRIG 846*  CHOLHDL 3.3   Anemia Panel: No results for input(s): "VITAMINB12", "FOLATE", "FERRITIN", "TIBC", "IRON",  "RETICCTPCT" in the last 72 hours. Urine analysis:    Component Value Date/Time   COLORURINE YELLOW 08/13/2014 1330   APPEARANCEUR CLEAR 08/13/2014 1330   LABSPEC 1.010 08/13/2014 1330   PHURINE 7.5 08/13/2014 1330   GLUCOSEU NEGATIVE 08/13/2014 1330   HGBUR NEGATIVE 08/13/2014 1330   BILIRUBINUR NEGATIVE 08/13/2014 1330  KETONESUR NEGATIVE 08/13/2014 1330   PROTEINUR NEGATIVE 08/13/2014 1330   UROBILINOGEN 0.2 08/13/2014 1330   NITRITE NEGATIVE 08/13/2014 1330   LEUKOCYTESUR NEGATIVE 08/13/2014 1330   Sepsis Labs: Invalid input(s): "PROCALCITONIN", "LACTICIDVEN"  Microbiology: Recent Results (from the past 240 hours)  Blood culture (routine x 2)     Status: None   Collection Time: 10/21/23  1:30 PM   Specimen: BLOOD LEFT ARM  Result Value Ref Range Status   Specimen Description BLOOD LEFT ARM  Final   Special Requests   Final    BOTTLES DRAWN AEROBIC AND ANAEROBIC Blood Culture results may not be optimal due to an inadequate volume of blood received in culture bottles   Culture   Final    NO GROWTH 5 DAYS Performed at [facility], [address], [place], [place] [address]    Report Status 10/26/2023 FINAL  Final  Blood culture (routine x 2)     Status: None   Collection Time: 10/21/23  2:24 PM   Specimen: BLOOD  Result Value Ref Range Status   Specimen Description BLOOD RIGHT ANTECUBITAL  Final   Special Requests   Final    BOTTLES DRAWN AEROBIC AND ANAEROBIC Blood Culture adequate volume   Culture   Final    NO GROWTH 5 DAYS Performed at [facility], [address], [place], [place] [address]    Report Status 10/26/2023 FINAL  Final  MRSA Next Gen by PCR, Nasal     Status: None   Collection Time: 10/21/23  9:15 PM   Specimen: Nasal Mucosa; Nasal Swab  Result Value Ref Range Status   MRSA by PCR Next Gen NOT DETECTED NOT DETECTED Final    Comment: (NOTE) The GeneXpert MRSA Assay (FDA approved for NASAL specimens only), is one component of a  comprehensive MRSA colonization surveillance program. It is not intended to diagnose MRSA infection nor to guide or monitor treatment for MRSA infections. Test performance is not FDA approved in patients less than 45 years old. Performed at [facility], [address] [place], [place] [address]     Radiology Studies: PERIPHERAL VASCULAR CATHETERIZATION Result Date: 10/25/2023 Images from the original result were not included.   Patient name: Jason Spencer MRN: [id number]        DOB: [birth year]        Sex: male  10/25/2023 Pre-operative Diagnosis: Critical limb ischemia of left lower extremity with tissue loss Post-operative diagnosis:  Same Surgeon:  Young Hensen, MD Procedure Performed: 1.  Ultrasound-guided access right common femoral artery 2.  Aortogram with catheter selection of aorta 3.  Left lower extremity arteriogram with catheter selection of the left peroneal artery 4.  Shockwave lithotripsy left SFA and above-knee popliteal artery (4 mm x 80 mm shockwave x 400 pulses) 5.  Shockwave lithotripsy of the left behind the knee popliteal artery, below-knee popliteal artery, TP trunk, proximal peroneal artery (3 mm x 80 mm shockwave x 400 pulses) 6.  Drug-coated balloon angioplasty left popliteal artery (4 mm x 80 mm drug-coated Ranger) 7.  Angioplasty and stent of the left SFA and above-knee popliteal artery (6 mm x 150 mm coated Eluvia x 2 and 6 mm x 60 mm Eluvia all postdilated with a 5 mm Mustang) 8.  137 minutes monitored moderate conscious sedation  Indications: 73 year old male seen with critical limb ischemia with tissue loss in the left lower extremity.  He presents for aortogram, lower extremity arteriogram with possible intervention with  a focus on the left leg.  Findings:  Ultrasound-guided access right common femoral artery.  Aortogram showed patent renal arteries bilaterally with a patent infrarenal aorta.  On the left there was calcification in the common iliac but  otherwise no flow-limiting stenosis in the left common iliac or external iliac.  There was some common femoral disease that did not appear flow-limiting.  The profunda was patent.  Patient had diffusely diseased SFA with long segment significant high-grade greater than 80% flow-limiting calcified stenosis with a distal chronic total occlusion.  He reconstituted above-knee popliteal artery and then had a high-grade subtotal occlusion behind the knee in the popliteal artery.  Dominant runoff was in the peroneal artery.  The anterior tibial was patent although there was some disease proximally near the ostium.  Ultimately I was able to get through the left SFA popliteal disease antegrade and into the peroneal artery although this was very challenging.  Initially performed shockwave lithotripsy of the mid to distal SFA and above-knee popliteal artery with 4 mm shockwave lithotripsy balloon x 400 pulses throughout the segment.  I could not get this to track to the popliteal artery behind the knee due to a subtotal occlusion.  I then used a 3 mm shockwave lithotripsy balloon and treated the behind the knee popliteal artery below-knee popliteal artery as well as the TP trunk and proximal peroneal artery with 400 pulses as well.  I then elected to use drug-coated balloon behind the knee for the subtotal occlusion as there was still significant residual flow-limiting stenosis with a 4 mm Ranger for 3 minutes.  This showed much better results.  Finally I elected to stent the entire SFA above-knee popliteal artery with 6 mm Eluvia's postdilated with a 5 mm Mustang.  Patient now has widely patent stents with no evidence of significant residual stenosis behind the knee and two-vessel runoff.             Procedure:  The patient was identified in the holding area and taken to room 8.  The patient was then placed supine on the table and prepped and draped in the usual sterile fashion.  A time out was called.  The patient received  Versed  and fentanyl  for conscious moderate sedation.  Vital signs were monitored including heart rate, respiratory rate, oxygenation and blood pressure.  I was present for all moderate sedation.  Ultrasound was used to evaluate the right common femoral artery.  It was patent .  A digital ultrasound image was acquired.  A micropuncture needle was used to access the right common femoral artery under ultrasound guidance.  An 018 wire was advanced without resistance and a micropuncture sheath was placed.  The 018 wire was removed and a benson wire was placed.  The micropuncture sheath was exchanged for a 5 french sheath.  An omniflush catheter was advanced over the wire to the level of L-1.  An abdominal angiogram was obtained.  Next, using the omniflush catheter and a benson wire, the aortic bifurcation was crossed and the catheter was placed into theleft external iliac artery and left runoff was obtained.   Ultimately elected for intervention and I used a Glidewire advantage and upsized to a long 6 [place] catapult sheath in the right groin over the aortic bifurcation into the left common femoral.  Patient was given 100 units/kg IV heparin .  Additional heparin  was given during the case.  I then used a Glidewire advantage with quick cross catheter and I was able to get  through the left SFA disease including the chronic total occlusion and this was challenging.  I then met additional technical issue behind the knee as initially I could not get through the popliteal lesion and the wire wanted to go out collateral vessels.  Finally got through the subtotal occlusion behind the knee and got a wire down the peroneal artery.  I then selected a 4 mm x 80 mm shockwave lithotripsy balloon and exchanged for a [place] core wire for an 014 system.  I performed shockwave of the above-knee popliteal artery into the SFA with inflation to 4 atm and using at least 80-120 pulses in each segment until the entire 400 pulses was used.  This  balloon would not track behind the knee due to the subtotal occlusion.  Ultimately I did briefly lose wire access and then spent a lot of time getting back through the popliteal artery behind the knee and finally got my wire back down the peroneal again.  I elected to use a smaller shockwave balloon and selected a 3 mm x 80 mm shockwave balloon and I was able to get this to track through the subtotal occlusion in the popliteal artery behind the knee all the way into the proximal peroneal.  I then inflated this to 3 atm and again used 80-120 pulses for the proximal peroneal artery, TP trunk and below-knee popliteal artery and popliteal artery behind the knee.  There was still significant residual disease in the popliteal artery behind the knee so I then used a 4 mm x 80 mm drug-coated Ranger for a long 3-minute inflation.  I was much happier with these results and no significant residual stenosis.  I then elected to stent the entire SFA above-knee popliteal artery on the left with 6 mm Eluvia's.  I then placed a 6 mm x 150 mm Eluvia x 2 and a 6 mm x 60 mm Eluvia all postdilated with 5 mm Mustang's.  Patient now has widely patent stents with a patent popliteal artery behind the knee and two-vessel runoff without flow-limiting stenosis.  All of his arteries are calcified and heavily diseased.  Short 6 French sheath was placed in the right groin after removed wires and catheters.  To be taken holding for sheath removal.  Plan: Excellent results.  Optimized from vascular surgery.  Aspirin , plavix , statin.   Young Hensen, MD [facility] Office: [phone]  Murleen Arms. Emry Barbato [facility]  If 7PM-7AM, please contact night-coverage [email or web address] 10/26/2023, 11:49 AM

## 2023-10-27 DIAGNOSIS — I739 Peripheral vascular disease, unspecified: Secondary | ICD-10-CM | POA: Diagnosis not present

## 2023-10-27 DIAGNOSIS — I4892 Unspecified atrial flutter: Secondary | ICD-10-CM | POA: Diagnosis not present

## 2023-10-27 DIAGNOSIS — M869 Osteomyelitis, unspecified: Secondary | ICD-10-CM | POA: Diagnosis not present

## 2023-10-27 DIAGNOSIS — F411 Generalized anxiety disorder: Secondary | ICD-10-CM | POA: Diagnosis not present

## 2023-10-27 LAB — RENAL FUNCTION PANEL
Albumin: 2.2 g/dL — ABNORMAL LOW (ref 3.5–5.0)
Anion gap: 10 (ref 5–15)
BUN: 13 mg/dL (ref 8–23)
CO2: 24 mmol/L (ref 22–32)
Calcium: 8.6 mg/dL — ABNORMAL LOW (ref 8.9–10.3)
Chloride: 104 mmol/L (ref 98–111)
Creatinine, Ser: 1.29 mg/dL — ABNORMAL HIGH (ref 0.61–1.24)
GFR, Estimated: 60 mL/min — ABNORMAL LOW (ref >60)
Glucose, Bld: 97 mg/dL (ref 70–99)
Phosphorus: 2.8 mg/dL (ref 2.5–4.6)
Potassium: 3.6 mmol/L (ref 3.5–5.1)
Sodium: 138 mmol/L (ref 135–145)

## 2023-10-27 LAB — CBC WITH DIFFERENTIAL/PLATELET
Abs Immature Granulocytes: 0.09 10*3/uL — ABNORMAL HIGH (ref 0.00–0.07)
Basophils Absolute: 0 10*3/uL (ref 0.0–0.1)
Basophils Relative: 0 %
Eosinophils Absolute: 0.2 10*3/uL (ref 0.0–0.5)
Eosinophils Relative: 1 %
HCT: 27.5 % — ABNORMAL LOW (ref 39.0–52.0)
Hemoglobin: 8.7 g/dL — ABNORMAL LOW (ref 13.0–17.0)
Immature Granulocytes: 1 %
Lymphocytes Relative: 9 %
Lymphs Abs: 1.2 10*3/uL (ref 0.7–4.0)
MCH: 31.1 pg (ref 26.0–34.0)
MCHC: 31.6 g/dL (ref 30.0–36.0)
MCV: 98.2 fL (ref 80.0–100.0)
Monocytes Absolute: 1.1 10*3/uL — ABNORMAL HIGH (ref 0.1–1.0)
Monocytes Relative: 9 %
Neutro Abs: 10.3 10*3/uL — ABNORMAL HIGH (ref 1.7–7.7)
Neutrophils Relative %: 80 %
Platelets: 311 10*3/uL (ref 150–400)
RBC: 2.8 MIL/uL — ABNORMAL LOW (ref 4.22–5.81)
RDW: 12.5 % (ref 11.5–15.5)
WBC: 12.9 10*3/uL — ABNORMAL HIGH (ref 4.0–10.5)
nRBC: 0 % (ref 0.0–0.2)

## 2023-10-27 LAB — SEDIMENTATION RATE: Sed Rate: 140 mm/h — ABNORMAL HIGH (ref 0–16)

## 2023-10-27 LAB — PROTIME-INR
INR: 1.3 — ABNORMAL HIGH (ref 0.8–1.2)
Prothrombin Time: 15.9 s — ABNORMAL HIGH (ref 11.4–15.2)

## 2023-10-27 LAB — HEPARIN LEVEL (UNFRACTIONATED)
Heparin Unfractionated: 0.38 [IU]/mL (ref 0.30–0.70)
Heparin Unfractionated: 0.33 [IU]/mL (ref 0.30–0.70)

## 2023-10-27 LAB — MAGNESIUM: Magnesium: 2.1 mg/dL (ref 1.7–2.4)

## 2023-10-27 LAB — C-REACTIVE PROTEIN: CRP: 13.6 mg/dL — ABNORMAL HIGH (ref <1.0)

## 2023-10-27 MED ORDER — ROSUVASTATIN CALCIUM 20 MG PO TABS
20.0000 mg | ORAL_TABLET | Freq: Every day | ORAL | Status: DC
  Administered 2023-10-28 – 2023-11-06 (×10): 20 mg via ORAL
  Filled 2023-10-27 (×10): qty 1

## 2023-10-27 MED ORDER — HYDROCODONE-ACETAMINOPHEN 5-325 MG PO TABS
1.0000 | ORAL_TABLET | ORAL | Status: DC | PRN
  Administered 2023-10-27 – 2023-11-06 (×33): 1 via ORAL
  Filled 2023-10-27 (×33): qty 1

## 2023-10-27 NOTE — Progress Notes (Signed)
 PROGRESS NOTE  Jaykub Mackins ZOX:[id number] DOB: 1950-10-04   PCP: Yevette Hem, FNP  Patient is from: Home  DOA: 10/21/2023 LOS: 6  Chief complaints Chief Complaint  Patient presents with   Circulatory Problem     Brief Narrative / Interim history: 73 year old M with PMH of COPD, CAD, atrial fibrillation/flutter on warfarin, HTN, HLD, EtOH use, anxiety, pleural effusion, psoriasis, PAD and tobacco use disorder presenting with progressive left 4th and 5th toe pain and discoloration for about 2 weeks, and admitted with critical limb ischemia with left 4th and 5th toe gangrene, and fifth metatarsal head osteomyelitis.   He had mild leukocytosis with elevated CRP to 9.9.  Has no fever.  Pro-Cal negative.  Foot x-ray showed subcutaneous gas about the head of the fifth metatarsal and proximal phalanx of left fifth toe with cortical loss medially from the fifth metatarsal head consistent with osteomyelitis.  Blood cultures obtained.  Patient was started on broad-spectrum antibiotics.  Orthopedic surgery and vascular surgery consulted.   CT angio showed severe bilateral multifocal high-grade stenosis from femoral arteries down, and aortic atherosclerosis.    INR reversed with vitamin K and he underwent LLE angiogram with shockwave lithotripsy, drug-coated balloon angioplasty and stent to left SFA and above knee left popliteal artery on 4/17.  Started on Plavix  and aspirin .  Remains on broad-spectrum antibiotics.  Plan for transmetatarsal amputation on Wednesday.  Subjective: Seen and examined earlier this morning.  No major events overnight or this morning.  Continues to endorse intermittent left foot pain.  No other new complaints.  Objective: Vitals:   10/26/23 0756 10/26/23 1930 10/27/23 0414 10/27/23 0718  BP: (!) 109/91 107/61 123/70 107/75  Pulse: 94 85 84 88  Resp: 16 20 16 18   Temp: 97.7 F (36.5 C) 98.2 F (36.8 C) 98.4 F (36.9 C) 97.8 F (36.6 C)  TempSrc: Oral Oral  Oral Oral  SpO2: 97% 97% 100% 97%  Weight:      Height:        Examination:  GENERAL: No apparent distress.  Nontoxic. HEENT: MMM.  Vision and hearing grossly intact.  NECK: Supple.  No apparent JVD.  RESP:  No IWOB.  Fair aeration bilaterally. CVS:  RRR. Heart sounds normal.  ABD/GI/GU: BS+. Abd soft, NTND.  MSK/EXT:  Moves extremities.  Dry gangrene in left 4th and 5th toe.  Faint DP pulses.  Significant muscle mass and subcu fat loss.  Erythema over the dorsum of left foot.  Dry skin in BLE.  See picture in media for more. SKIN: As above. NEURO: Awake, alert and oriented appropriately.  No apparent focal neuro deficit. PSYCH: Calm. Normal affect.   Consultants:  Orthopedic surgery Vascular surgery  Procedures: 4/17-LLE angiogram with shockwave lithotripsy, drug-coated balloon angioplasty and stent to left SFA and above knee left popliteal artery.  See op note for details.  Microbiology summarized: MRSA PCR screen nonreactive Blood cultures NGTD  Assessment and plan: Left lower extremity critical limb ischemia with 4th and 5th dry gangrene: Present on arrival Severe bilateral PAD-as noted on CT angio Left foot osteomyelitis: Present on arrival -Left foot x-ray and CT angio as above. -S/p LLE angiogram with shockwave lithotripsy, drug-coated balloon angioplasty and stent to left SFA and above knee left popliteal artery by Dr. Fulton Job on 4/17.  -Continue aspirin  and Plavix  -Vancomycin >> Zyvox  -Continue IV Zosyn  -Continue home Crestor .  LDL only 48. -Emphasized the importance of smoking cessation -Plan for transmetatarsal amputation on Wednesday   Chronic atrial fibrillation/flutter: Rate  controlled.  On warfarin at home.  INR subtherapeutic after reversal for surgery -Resumed anticoagulation with IV heparin  on 4/18 -Continue holding warfarin in anticipation for surgical intervention -May consider DOAC's if cost is not a barrier.  I do not see a contraindication -Consider  home metoprolol   AKI: Baseline Cr~1.0.  AKI could be due to IV contrast, IV antibiotics and lisinopril .  Improved. Recent Labs    03/09/23 1153 09/06/23 1124 10/21/23 1327 10/22/23 0346 10/23/23 0402 10/24/23 0350 10/25/23 0424 10/25/23 1951 10/26/23 0418 10/27/23 0819  BUN 20 11 9 8  7* 5* 11 13 15 13   CREATININE 1.35* 0.97 1.04 1.19 1.27* 1.36* 1.18 1.40* 1.49* 1.29*  - Vancomycin  changed to Zyvox . - Continue holding lisinopril  - Avoid nephrotoxic meds - Continue monitoring  ABLA superimposed on anemia of chronic disease: Suspect surgical blood loss Recent Labs    01/08/23 1205 09/06/23 1124 10/21/23 1327 10/22/23 0346 10/23/23 0402 10/24/23 0350 10/25/23 0424 10/25/23 1951 10/26/23 0418 10/27/23 0819  HGB 14.2 13.2 12.2* 11.9* 11.4* 11.0* 11.7* 11.6* 10.2* 8.7*  -Continue monitoring.  History of CAD: No cardiopulmonary symptoms. - Continue home metoprolol  and Crestor  - Now on aspirin  and Plavix  for PAD  Psoriasis: Did not follow-up with his dermatologist for a while - Continue clobetasol  cream twice daily   Essential hypertension: Normotensive for most part -Continue home amlodipine  and metoprolol  -Continue holding lisinopril   Chronic COPD: Stable -Continue home inhalers or hospital formulary -Encouraged smoking cessation  Alcohol abuse: No withdrawal symptoms.  CIWA 0. - Multivitamin, thiamine  and folic acid   Leukocytosis: Improved. -Continue monitoring -Antibiotics as above  Body mass index is 25.12 kg/m.           DVT prophylaxis:  On full dose anticoagulation  Code Status: Full code Family Communication: None at bedside Level of care: Med-Surg Status is: Inpatient Remains inpatient appropriate because: Critical limb ischemia, osteomyelitis   Final disposition: Likely home   55 minutes with more than 50% spent in reviewing records, counseling patient/family and coordinating care.   Sch Meds:  Scheduled Meds:  amLODipine   5 mg  Oral Daily   aspirin   81 mg Oral Daily   budeson-glycopyrrolate -formoterol   2 puff Inhalation BID   clobetasol  cream   Topical BID   clopidogrel   75 mg Oral Daily   folic acid   1 mg Oral Daily   linezolid   600 mg Oral Q12H   metoprolol  succinate  100 mg Oral Daily   multivitamin with minerals  1 tablet Oral Daily   [START ON 10/28/2023] rosuvastatin   20 mg Oral Daily   sodium chloride  flush  3 mL Intravenous Q12H   sodium chloride  flush  3 mL Intravenous Q12H   thiamine   100 mg Oral Daily   Continuous Infusions:  heparin  1,450 Units/hr (10/27/23 0829)   piperacillin -tazobactam 3.375 g (10/27/23 1352)   PRN Meds:.acetaminophen  **OR** acetaminophen , acetaminophen , hydrALAZINE , HYDROcodone -acetaminophen , labetalol , morphine  injection, polyethylene glycol, sodium chloride  flush  Antimicrobials: Anti-infectives (From admission, onward)    Start     Dose/Rate Route Frequency Ordered Stop   10/22/23 1045  linezolid  (ZYVOX ) tablet 600 mg        600 mg Oral Every 12 hours 10/22/23 0954     10/22/23 0600  vancomycin  (VANCOCIN ) IVPB 1000 mg/200 mL premix  Status:  Discontinued        1,000 mg 200 mL/hr over 60 Minutes Intravenous Every 12 hours 10/21/23 1738 10/21/23 1743   10/21/23 2200  piperacillin -tazobactam (ZOSYN ) IVPB 3.375 g  Placed in "Followed by" Linked Group   3.375 g 12.5 mL/hr over 240 Minutes Intravenous Every 8 hours 10/21/23 1504     10/21/23 1745  linezolid  (ZYVOX ) IVPB 600 mg  Status:  Discontinued        600 mg 300 mL/hr over 60 Minutes Intravenous Every 12 hours 10/21/23 1744 10/22/23 0954   10/21/23 1530  clindamycin  (CLEOCIN ) IVPB 600 mg  Status:  Discontinued        600 mg 100 mL/hr over 30 Minutes Intravenous  Once 10/21/23 1523 10/21/23 1715   10/21/23 1515  vancomycin  (VANCOREADY) IVPB 1750 mg/350 mL  Status:  Discontinued        1,750 mg 175 mL/hr over 120 Minutes Intravenous  Once 10/21/23 1504 10/21/23 1743   10/21/23 1515  piperacillin -tazobactam  (ZOSYN ) IVPB 3.375 g       Placed in "Followed by" Linked Group   3.375 g 100 mL/hr over 30 Minutes Intravenous  Once 10/21/23 1504 10/21/23 1804        I have personally reviewed the following labs and images: CBC: Recent Labs  Lab 10/23/23 0402 10/24/23 0350 10/25/23 0424 10/25/23 1951 10/26/23 0418 10/27/23 0819  WBC 12.1* 10.5 11.4*  --  14.7* 12.9*  NEUTROABS 9.2* 7.9* 8.7*  --  12.2* 10.3*  HGB 11.4* 11.0* 11.7* 11.6* 10.2* 8.7*  HCT 35.1* 34.7* 36.3* 34.0* 32.0* 27.5*  MCV 96.2 97.2 95.8  --  97.9 98.2  PLT 372 345 372  --  367 311   BMP &GFR Recent Labs  Lab 10/23/23 0402 10/24/23 0350 10/25/23 0424 10/25/23 1951 10/26/23 0418 10/27/23 0819  NA 137 136 138 139 138 138  K 3.8 3.7 3.9 4.1 4.0 3.6  CL 104 106 103 108 103 104  CO2 23 22 20*  --  20* 24  GLUCOSE 89 88 87 110* 101* 97  BUN 7* 5* 11 13 15 13   CREATININE 1.27* 1.36* 1.18 1.40* 1.49* 1.29*  CALCIUM  8.3* 8.3* 8.7*  --  8.5* 8.6*  MG 2.0 1.9 2.0  --  2.1 2.1  PHOS  --   --   --   --   --  2.8   Estimated Creatinine Clearance: 55 mL/min (A) (by C-G formula based on SCr of 1.29 mg/dL (H)). Liver & Pancreas: Recent Labs  Lab 10/21/23 1327 10/22/23 0346 10/27/23 0819  AST 19 20  --   ALT 10 12  --   ALKPHOS 51 49  --   BILITOT 0.4 0.6  --   PROT 8.2* 7.4  --   ALBUMIN 2.4* 2.2* 2.2*   No results for input(s): "LIPASE", "AMYLASE" in the last 168 hours. No results for input(s): "AMMONIA" in the last 168 hours. Diabetic: No results for input(s): "HGBA1C" in the last 72 hours. No results for input(s): "GLUCAP" in the last 168 hours. Cardiac Enzymes: No results for input(s): "CKTOTAL", "CKMB", "CKMBINDEX", "TROPONINI" in the last 168 hours. No results for input(s): "PROBNP" in the last 8760 hours. Coagulation Profile: Recent Labs  Lab 10/24/23 0350 10/24/23 2024 10/25/23 0424 10/26/23 0418 10/27/23 0819  INR 2.2* 1.7* 1.4* 1.4* 1.3*   Thyroid  Function Tests: No results for input(s):  "TSH", "T4TOTAL", "FREET4", "T3FREE", "THYROIDAB" in the last 72 hours. Lipid Profile: Recent Labs    10/26/23 0418  CHOL 117  HDL 35*  LDLCALC 48  TRIG 914*  CHOLHDL 3.3   Anemia Panel: No results for input(s): "VITAMINB12", "FOLATE", "FERRITIN", "TIBC", "IRON", "RETICCTPCT" in the last 72 hours.  Urine analysis:    Component Value Date/Time   COLORURINE YELLOW 08/13/2014 1330   APPEARANCEUR CLEAR 08/13/2014 1330   LABSPEC 1.010 08/13/2014 1330   PHURINE 7.5 08/13/2014 1330   GLUCOSEU NEGATIVE 08/13/2014 1330   HGBUR NEGATIVE 08/13/2014 1330   BILIRUBINUR NEGATIVE 08/13/2014 1330   KETONESUR NEGATIVE 08/13/2014 1330   PROTEINUR NEGATIVE 08/13/2014 1330   UROBILINOGEN 0.2 08/13/2014 1330   NITRITE NEGATIVE 08/13/2014 1330   LEUKOCYTESUR NEGATIVE 08/13/2014 1330   Sepsis Labs: Invalid input(s): "PROCALCITONIN", "LACTICIDVEN"  Microbiology: Recent Results (from the past 240 hours)  Blood culture (routine x 2)     Status: None   Collection Time: 10/21/23  1:30 PM   Specimen: BLOOD LEFT ARM  Result Value Ref Range Status   Specimen Description BLOOD LEFT ARM  Final   Special Requests   Final    BOTTLES DRAWN AEROBIC AND ANAEROBIC Blood Culture results may not be optimal due to an inadequate volume of blood received in culture bottles   Culture   Final    NO GROWTH 5 DAYS Performed at [facility], [address], [place], [place] [address]    Report Status 10/26/2023 FINAL  Final  Blood culture (routine x 2)     Status: None   Collection Time: 10/21/23  2:24 PM   Specimen: BLOOD  Result Value Ref Range Status   Specimen Description BLOOD RIGHT ANTECUBITAL  Final   Special Requests   Final    BOTTLES DRAWN AEROBIC AND ANAEROBIC Blood Culture adequate volume   Culture   Final    NO GROWTH 5 DAYS Performed at [facility], [address] [place], [place] [address]    Report Status 10/26/2023 FINAL  Final  MRSA Next Gen by PCR, Nasal     Status: None    Collection Time: 10/21/23  9:15 PM   Specimen: Nasal Mucosa; Nasal Swab  Result Value Ref Range Status   MRSA by PCR Next Gen NOT DETECTED NOT DETECTED Final    Comment: (NOTE) The GeneXpert MRSA Assay (FDA approved for NASAL specimens only), is one component of a comprehensive MRSA colonization surveillance program. It is not intended to diagnose MRSA infection nor to guide or monitor treatment for MRSA infections. Test performance is not FDA approved in patients less than 31 years old. Performed at [facility], [address] [place], [place] [address]     Radiology Studies: No results found.     Jareth Pardee T. Grete Bosko [facility]  If 7PM-7AM, please contact night-coverage [email or web address] 10/27/2023, 3:02 PM

## 2023-10-27 NOTE — TOC Progression Note (Signed)
 Transition of Care [facility]) - Progression Note    Patient Details  Name: Jason Spencer MRN: [id number] Date of Birth: 24-Jul-1950  [facility]) CM/SW Contact  Carmon Christen, LCSWA Phone Number: 10/27/2023, 2:07 PM  Clinical Narrative:     CSW received consult for possible SNF placement at time of discharge. CSW spoke with patient regarding PT recommendation of SNF placement at time of discharge. Patient reports he comes from home with sister. Patient expressed understanding of PT recommendation and is agreeable to SNF placement at time of discharge. Patient gave CSW permission to fax out initial referral for possible SNF placement. CSW discussed insurance authorization process and will provide Medicare SNF ratings list with accepted SNF bed offers when available.  No further questions reported at this time. CSW to continue to follow and assist with discharge planning needs.   Expected Discharge Plan: Home/Self Care Barriers to Discharge: Continued Medical Work up  Expected Discharge Plan and Services       Living arrangements for the past 2 months: Single Family Home                                       Social Determinants of Health (SDOH) Interventions SDOH Screenings   Food Insecurity: No Food Insecurity (10/21/2023)  Housing: Low Risk  (10/21/2023)  Transportation Needs: No Transportation Needs (10/21/2023)  Utilities: Not At Risk (10/21/2023)  Depression (PHQ2-9): Low Risk  (07/06/2023)  Financial Resource Strain: Low Risk  (01/08/2023)  Physical Activity: Insufficiently Active (01/08/2023)  Social Connections: Moderately Integrated (10/22/2023)  Tobacco Use: High Risk (10/21/2023)    Readmission Risk Interventions     No data to display

## 2023-10-27 NOTE — Progress Notes (Signed)
 PHARMACY - ANTICOAGULATION CONSULT NOTE  Pharmacy Consult for Heparin   (warfarin on hold) Indication: atrial fibrillation and limb ischemia  Allergies  Allergen Reactions   Shellfish Allergy Nausea And Vomiting and Other (See Comments)    Shrimp, especially    Patient Measurements: Height: 5\' 10"  (177.8 cm) Weight: 79.4 kg (175 lb 0.7 oz) IBW/kg (Calculated) : 73 HEPARIN  DW (KG): 86.2  Vital Signs: Temp: 98.4 F (36.9 C) (04/19 0414) Temp Source: Oral (04/19 0414) BP: 107/75 (04/19 0718) Pulse Rate: 84 (04/19 0414)  Labs: Recent Labs    10/24/23 2024 10/25/23 0424 10/25/23 0424 10/25/23 1951 10/26/23 0418 10/26/23 0742 10/26/23 2034  HGB  --  11.7*   < > 11.6* 10.2*  --   --   HCT  --  36.3*  --  34.0* 32.0*  --   --   PLT  --  372  --   --  367  --   --   LABPROT 19.7* 17.4*  --   --  17.1*  --   --   INR 1.7* 1.4*  --   --  1.4*  --   --   HEPARINUNFRC  --  0.16*  --   --   --  <0.10* 0.25*  CREATININE  --  1.18  --  1.40* 1.49*  --   --    < > = values in this interval not displayed.    Estimated Creatinine Clearance: 47.6 mL/min (A) (by C-G formula based on SCr of 1.49 mg/dL (H)).  Assessment: 75 yom with a history of HF, AF on warfarin, CAD, COPD, PAD . Patient presenting with toe pain, reports 5th and 4th digit on his left foot gradually started becoming black proximately 2 weeks ago. Heparin  per pharmacy consult placed for atrial fibrillation and limb ischemia .  Holding warfarin.  He is s/p drug-coated balloon angioplasty/stent on 4/17 and heparin  was stopped last night due to a hematoma s/p sheath pull. Plans are now to resume heparin  -Last heparin  rate was 1350 units/hr -hg= 10.2  PTA Warfarin: 7.5 mg TTSS, 5 mg MWF  4/19 AM: Heparin  level therapeutic at 0.38 while on 1450 units/hr. Hgb down to 8.7, PLT stable 311. No issues with infusion and no bleeding noted per RN.  Goal of Therapy:  Heparin  level 0.3-0.7 units/ml Monitor platelets by  anticoagulation protocol: Yes   Plan:  Continue heparin  infusion at 1450 units/hr Check confirmatory heparin  level in 8hrs and daily while on heparin  Daily PT/INR, CBC Warfarin on hold for amputation next week.  [name], PharmD PGY1 Pharmacy Resident 10/27/2023 7:29 AM

## 2023-10-27 NOTE — Progress Notes (Signed)
 PHARMACY - ANTICOAGULATION CONSULT NOTE  Pharmacy Consult for Heparin   (warfarin on hold) Indication: atrial fibrillation and limb ischemia  Allergies  Allergen Reactions   Shellfish Allergy Nausea And Vomiting and Other (See Comments)    Shrimp, especially    Patient Measurements: Height: 5\' 10"  (177.8 cm) Weight: 79.4 kg (175 lb 0.7 oz) IBW/kg (Calculated) : 73 HEPARIN  DW (KG): 86.2  Vital Signs: Temp: 97.8 F (36.6 C) (04/19 0718) Temp Source: Oral (04/19 0718) BP: 107/75 (04/19 0718) Pulse Rate: 88 (04/19 0718)  Labs: Recent Labs    10/25/23 0424 10/25/23 1951 10/26/23 0418 10/26/23 0742 10/26/23 2034 10/27/23 0819 10/27/23 1816  HGB 11.7* 11.6* 10.2*  --   --  8.7*  --   HCT 36.3* 34.0* 32.0*  --   --  27.5*  --   PLT 372  --  367  --   --  311  --   LABPROT 17.4*  --  17.1*  --   --  15.9*  --   INR 1.4*  --  1.4*  --   --  1.3*  --   HEPARINUNFRC 0.16*  --   --    < > 0.25* 0.38 0.33  CREATININE 1.18 1.40* 1.49*  --   --  1.29*  --    < > = values in this interval not displayed.    Estimated Creatinine Clearance: 55 mL/min (A) (by C-G formula based on SCr of 1.29 mg/dL (H)).  Assessment: 58 yom with a history of HF, AF on warfarin, CAD, COPD, PAD . Patient presenting with toe pain, reports 5th and 4th digit on his left foot gradually started becoming black proximately 2 weeks ago. Heparin  per pharmacy consult placed for atrial fibrillation and limb ischemia .  Holding warfarin.  He is s/p drug-coated balloon angioplasty/stent on 4/17. Heparin  level this evening came back therapeutic at 0.33, on 1450 units/hr. Hgb 8.7, plt 311 - monitor. No s/sx of bleeding or infusion issues per RN.   Goal of Therapy:  Heparin  level 0.3-0.7 units/ml Monitor platelets by anticoagulation protocol: Yes   Plan:  Continue heparin  infusion at 1450 units/hr Check heparin  level daily while on heparin  Daily PT/INR, CBC Warfarin on hold for amputation next week.  Thank you  for allowing pharmacy to participate in this patient's care,  Nieves Bars, PharmD, BCCCP Clinical Pharmacist  Phone: [phone] 10/27/2023 7:02 PM  Please check AMION for all [facility] Pharmacy phone numbers After 10:00 PM, call Main Pharmacy [phone]

## 2023-10-27 NOTE — TOC Initial Note (Signed)
 Transition of Care [facility]) - Initial/Assessment Note    Patient Details  Name: Jason Spencer MRN: [id number] Date of Birth: 1951-01-17  [facility] CM/SW Contact:    Ernst Heap Phone Number: [phone] 10/27/2023, 12:10 PM  Clinical Narrative:  CSW met with the patient at bedside. Patient stated that he lives with his sister who is currently at [facility]. Patient stated that he has not history of [facility] services. Patient stated that he does not use any equipment, but in need of a walker and bedside commode. Patient stated that he has a scale at home. Patient has a PCP. Patient stated at dc his cousin will provide transportation home.   TOC will continue following.           Expected Discharge Plan: Home/Self Care Barriers to Discharge: Continued Medical Work up   Patient Goals and CMS Choice            Expected Discharge Plan and Services       Living arrangements for the past 2 months: Single Family Home                                      Prior Living Arrangements/Services Living arrangements for the past 2 months: Single Family Home Lives with:: Siblings Patient language and need for interpreter reviewed:: Yes Do you feel safe going back to the place where you live?: Yes      Need for Family Participation in Patient Care: No (Comment) Care giver support system in place?: Yes (comment)   Criminal Activity/Legal Involvement Pertinent to Current Situation/Hospitalization: No - Comment as needed  Activities of Daily Living   ADL Screening (condition at time of admission) Independently performs ADLs?: No Does the patient have a NEW difficulty with bathing/dressing/toileting/self-feeding that is expected to last >3 days?: Yes (Initiates electronic notice to provider for possible OT consult) Does the patient have a NEW difficulty with getting in/out of bed, walking, or climbing stairs that is expected to last >3 days?: Yes  (Initiates electronic notice to provider for possible PT consult) Does the patient have a NEW difficulty with communication that is expected to last >3 days?: No Is the patient deaf or have difficulty hearing?: No Does the patient have difficulty seeing, even when wearing glasses/contacts?: No Does the patient have difficulty concentrating, remembering, or making decisions?: No  Permission Sought/Granted                  Emotional Assessment Appearance:: Appears stated age Attitude/Demeanor/Rapport: Engaged Affect (typically observed): Appropriate Orientation: : Oriented to Self, Oriented to Place, Oriented to  Time, Oriented to Situation Alcohol / Substance Use: Not Applicable Psych Involvement: No (comment)  Admission diagnosis:  Osteomyelitis (HCC) [M86.9] Acute osteomyelitis of toe of left foot (HCC) [H08.657] Patient Active Problem List   Diagnosis Date Noted   Acute osteomyelitis of toe of left foot (HCC) 10/26/2023   PAD (peripheral artery disease) (HCC) 10/23/2023   Osteomyelitis (HCC) 10/21/2023   Gangrene of left foot (HCC) 10/21/2023   Limb ischemia 10/21/2023   Atherosclerosis of aorta (HCC) 03/02/2022   Purpura senilis (HCC) 01/30/2022   Atrial flutter (HCC) 07/28/2019   Cardiomyopathy (HCC) 07/28/2019   Alcohol abuse 07/28/2019   Prolonged QT interval 05/19/2019   GAD (generalized anxiety disorder) 05/01/2019   COPD (chronic obstructive pulmonary disease) (HCC) 09/26/2018   Current use of anticoagulant therapy 09/12/2018  Hyperlipidemia 01/04/2017   Pre-diabetes 05/24/2016   Essential hypertension, benign 04/27/2016   History of prolonged Q-T interval on ECG 04/01/2015   CAD (coronary artery disease) 02/17/2015   Tobacco abuse    Hypokalemia    Loculated pleural effusion 08/15/2014   S/P thoracentesis    Acute on chronic systolic CHF (congestive heart failure) (HCC)    PCP:  Yevette Hem, FNP Pharmacy:   [facility] [place], [place] - [address] [place] [address] Phone: [phone] Fax: [phone]     Social Drivers of Health (SDOH) Social History: SDOH Screenings   Food Insecurity: No Food Insecurity (10/21/2023)  Housing: Low Risk  (10/21/2023)  Transportation Needs: No Transportation Needs (10/21/2023)  Utilities: Not At Risk (10/21/2023)  Depression (PHQ2-9): Low Risk  (07/06/2023)  Financial Resource Strain: Low Risk  (01/08/2023)  Physical Activity: Insufficiently Active (01/08/2023)  Social Connections: Moderately Integrated (10/22/2023)  Tobacco Use: High Risk (10/21/2023)   SDOH Interventions:     Readmission Risk Interventions     No data to display

## 2023-10-27 NOTE — Plan of Care (Signed)

## 2023-10-27 NOTE — NC FL2 (Signed)
 [place]  MEDICAID FL2 LEVEL OF CARE FORM     IDENTIFICATION  Patient Name: Jason Spencer Birthdate: [birth year] Sex: male Admission Date (Current Location): 10/21/2023  [facility] and [place] Number:  Producer, television/film/video and Address:  [facility], [address], [place], [place] [address]      Provider Number: [id number]  Attending Physician Name and Address:  Theadore Finger, MD  Relative Name and Phone Number:  Catha Clink (sister) [phone]    Current Level of Care: Hospital Recommended Level of Care: Skilled Nursing Facility Prior Approval Number:    Date Approved/Denied:   PASRR Number: [id number]  Discharge Plan: SNF    Current Diagnoses: Patient Active Problem List   Diagnosis Date Noted   Acute osteomyelitis of toe of left foot (HCC) 10/26/2023   PAD (peripheral artery disease) (HCC) 10/23/2023   Osteomyelitis (HCC) 10/21/2023   Gangrene of left foot (HCC) 10/21/2023   Limb ischemia 10/21/2023   Atherosclerosis of aorta (HCC) 03/02/2022   Purpura senilis (HCC) 01/30/2022   Atrial flutter (HCC) 07/28/2019   Cardiomyopathy (HCC) 07/28/2019   Alcohol abuse 07/28/2019   Prolonged QT interval 05/19/2019   GAD (generalized anxiety disorder) 05/01/2019   COPD (chronic obstructive pulmonary disease) (HCC) 09/26/2018   Current use of anticoagulant therapy 09/12/2018   Hyperlipidemia 01/04/2017   Pre-diabetes 05/24/2016   Essential hypertension, benign 04/27/2016   History of prolonged Q-T interval on ECG 04/01/2015   CAD (coronary artery disease) 02/17/2015   Tobacco abuse    Hypokalemia    Loculated pleural effusion 08/15/2014   S/P thoracentesis    Acute on chronic systolic CHF (congestive heart failure) (HCC)     Orientation RESPIRATION BLADDER Height & Weight     Self, Time, Situation, Place  Normal Continent Weight: 175 lb 0.7 oz (79.4 kg) Height:  5\' 10"  (177.8 cm)  BEHAVIORAL SYMPTOMS/MOOD NEUROLOGICAL BOWEL NUTRITION  STATUS      Continent Diet (Please see discharge summary)  AMBULATORY STATUS COMMUNICATION OF NEEDS Skin   Limited Assist Verbally Other (Comment) (Erythema,Leg,Bil.,Scratch marks,arm,R,Wound/Incision LDAs,Incision closed,back,L,upper,Wound/Incision open or dehisced venous stasis,ulcer,toe,L,necrotic,black)                       Personal Care Assistance Level of Assistance  Bathing, Feeding, Dressing Bathing Assistance: Limited assistance Feeding assistance: Independent Dressing Assistance: Limited assistance     Functional Limitations Info  Sight, Hearing, Speech Sight Info: Impaired Financial trader) Hearing Info: Impaired Speech Info: Adequate    SPECIAL CARE FACTORS FREQUENCY  PT (By licensed PT), OT (By licensed OT)     PT Frequency: 5x min weekly OT Frequency: 5x min weekly            Contractures Contractures Info: Not present    Additional Factors Info  Code Status, Allergies Code Status Info: FULL Allergies Info: Shellfish Allergy           Current Medications (10/27/2023):  This is the current hospital active medication list Current Facility-Administered Medications  Medication Dose Route Frequency Provider Last Rate Last Admin   acetaminophen  (TYLENOL ) tablet 650 mg  650 mg Oral Q6H PRN Young Hensen, MD       Or   acetaminophen  (TYLENOL ) suppository 650 mg  650 mg Rectal Q6H PRN Young Hensen, MD       acetaminophen  (TYLENOL ) tablet 650 mg  650 mg Oral Q4H PRN Young Hensen, MD       amLODipine  (NORVASC ) tablet 5 mg  5  mg Oral Daily Young Hensen, MD   5 mg at 10/27/23 1610   aspirin  chewable tablet 81 mg  81 mg Oral Daily Young Hensen, MD   81 mg at 10/27/23 9604   budeson-glycopyrrolate -formoterol  (BREZTRI ) 160-9-4.8 MCG/ACT inhaler 2 puff  2 puff Inhalation BID Young Hensen, MD   2 puff at 10/27/23 5409   clobetasol  cream (TEMOVATE ) 0.05 %   Topical BID Young Hensen, MD   Given at 10/27/23 [phone]    clopidogrel  (PLAVIX ) tablet 75 mg  75 mg Oral Daily Elgergawy, Dawood S, MD   75 mg at 10/27/23 1478   folic acid  (FOLVITE ) tablet 1 mg  1 mg Oral Daily Gonfa, Taye T, MD   1 mg at 10/27/23 0824   heparin  ADULT infusion 100 units/mL (25000 units/250mL)  1,450 Units/hr Intravenous Continuous Baldemar Lev, RPH 14.5 mL/hr at 10/27/23 0829 1,450 Units/hr at 10/27/23 2956   hydrALAZINE  (APRESOLINE ) injection 5 mg  5 mg Intravenous Q20 Min PRN Young Hensen, MD       HYDROcodone -acetaminophen  (NORCO/VICODIN) 5-325 MG per tablet 1 tablet  1 tablet Oral Q4H PRN Gonfa, Taye T, MD       labetalol  (NORMODYNE ) injection 10 mg  10 mg Intravenous Q10 min PRN Young Hensen, MD       linezolid  (ZYVOX ) tablet 600 mg  600 mg Oral Q12H Young Hensen, MD   600 mg at 10/27/23 2130   metoprolol  succinate (TOPROL -XL) 24 hr tablet 100 mg  100 mg Oral Daily Young Hensen, MD   100 mg at 10/27/23 0825   morphine  (PF) 2 MG/ML injection 1 mg  1 mg Intravenous Q4H PRN Young Hensen, MD   1 mg at 10/27/23 1350   multivitamin with minerals tablet 1 tablet  1 tablet Oral Daily Gonfa, Taye T, MD   1 tablet at 10/27/23 8657   piperacillin -tazobactam (ZOSYN ) IVPB 3.375 g  3.375 g Intravenous Q8H Young Hensen, MD 12.5 mL/hr at 10/27/23 1352 3.375 g at 10/27/23 1352   polyethylene glycol (MIRALAX  / GLYCOLAX ) packet 17 g  17 g Oral Daily PRN Young Hensen, MD       [START ON 10/28/2023] rosuvastatin  (CRESTOR ) tablet 20 mg  20 mg Oral Daily Klus, Alexa Andrews, RPH       sodium chloride  flush (NS) 0.9 % injection 3 mL  3 mL Intravenous Q12H Young Hensen, MD   3 mL at 10/27/23 0826   sodium chloride  flush (NS) 0.9 % injection 3 mL  3 mL Intravenous Q12H Young Hensen, MD   3 mL at 10/27/23 0825   sodium chloride  flush (NS) 0.9 % injection 3 mL  3 mL Intravenous PRN Young Hensen, MD       thiamine  (VITAMIN B1) tablet 100 mg  100 mg Oral Daily Gonfa, Taye T, MD   100 mg  at 10/27/23 8469     Discharge Medications: Please see discharge summary for a list of discharge medications.  Relevant Imaging Results:  Relevant Lab Results:   Additional Information [id number]  Carmon Christen, LCSWA

## 2023-10-27 NOTE — Progress Notes (Signed)
 PHARMACIST LIPID MONITORING Jason Spencer is a 73 y.o. male admitted on 10/21/2023 with critical limb ischemia.  Pharmacy has been consulted to optimize lipid-lowering therapy with the indication of secondary prevention for clinical ASCVD.  Recent Labs:  Lipid Panel (last 6 months):   Lab Results  Component Value Date   CHOL 117 10/26/2023   TRIG 168 (H) 10/26/2023   HDL 35 (L) 10/26/2023   CHOLHDL 3.3 10/26/2023   VLDL 34 10/26/2023   LDLCALC 48 10/26/2023    Hepatic function panel (last 6 months):   Lab Results  Component Value Date   AST 20 10/22/2023   ALT 12 10/22/2023   ALKPHOS 49 10/22/2023   BILITOT 0.6 10/22/2023    SCr (since admission):   Serum creatinine: 1.29 mg/dL (H) 16/10/96 0454 Estimated creatinine clearance: 55 mL/min (A)  Current therapy and lipid therapy tolerance Current lipid-lowering therapy: rosuvastatin  5 mg daily Previous lipid-lowering therapies (if applicable): Atorvastatin  40 mg  Documented or reported allergies or intolerances to lipid-lowering therapies (if applicable): N/A  Assessment:   Patient is at goal of LDL < 55 (48 on most recent check) on current therapy. However, he is agreeable to increasing to a high intensity statin.   Plan:    1.Statin intensity (high intensity recommended for all patients regardless of the LDL):  Add or increase statin to high intensity.  2.Add ezetimibe (if any one of the following):   Not indicated at this time.  3.Refer to lipid clinic:   No  4.Follow-up with:  Cardiology provider - Jason Grates, MD  5.Follow-up labs after discharge:  Changes in lipid therapy were made. Check a lipid panel in 8-12 weeks then annually.     Jason Spencer, PharmD PGY1 Pharmacy Resident 10/27/2023 9:56 AM

## 2023-10-28 DIAGNOSIS — M869 Osteomyelitis, unspecified: Secondary | ICD-10-CM | POA: Diagnosis not present

## 2023-10-28 DIAGNOSIS — F411 Generalized anxiety disorder: Secondary | ICD-10-CM | POA: Diagnosis not present

## 2023-10-28 DIAGNOSIS — I739 Peripheral vascular disease, unspecified: Secondary | ICD-10-CM | POA: Diagnosis not present

## 2023-10-28 DIAGNOSIS — I4892 Unspecified atrial flutter: Secondary | ICD-10-CM | POA: Diagnosis not present

## 2023-10-28 LAB — CBC
HCT: 23 % — ABNORMAL LOW (ref 39.0–52.0)
HCT: 24.3 % — ABNORMAL LOW (ref 39.0–52.0)
Hemoglobin: 7.4 g/dL — ABNORMAL LOW (ref 13.0–17.0)
Hemoglobin: 7.7 g/dL — ABNORMAL LOW (ref 13.0–17.0)
MCH: 31.8 pg (ref 26.0–34.0)
MCH: 31.3 pg (ref 26.0–34.0)
MCHC: 32.2 g/dL (ref 30.0–36.0)
MCHC: 31.7 g/dL (ref 30.0–36.0)
MCV: 98.7 fL (ref 80.0–100.0)
MCV: 98.8 fL (ref 80.0–100.0)
Platelets: 260 10*3/uL (ref 150–400)
Platelets: 274 10*3/uL (ref 150–400)
RBC: 2.33 MIL/uL — ABNORMAL LOW (ref 4.22–5.81)
RBC: 2.46 MIL/uL — ABNORMAL LOW (ref 4.22–5.81)
RDW: 12.7 % (ref 11.5–15.5)
RDW: 12.5 % (ref 11.5–15.5)
WBC: 11.3 10*3/uL — ABNORMAL HIGH (ref 4.0–10.5)
WBC: 12.1 10*3/uL — ABNORMAL HIGH (ref 4.0–10.5)
nRBC: 0 % (ref 0.0–0.2)
nRBC: 0 % (ref 0.0–0.2)

## 2023-10-28 LAB — RETICULOCYTES
Immature Retic Fract: 24.7 % — ABNORMAL HIGH (ref 2.3–15.9)
RBC.: 2.33 MIL/uL — ABNORMAL LOW (ref 4.22–5.81)
Retic Count, Absolute: 35.4 10*3/uL (ref 19.0–186.0)
Retic Ct Pct: 1.5 % (ref 0.4–3.1)

## 2023-10-28 LAB — RENAL FUNCTION PANEL
Albumin: 1.9 g/dL — ABNORMAL LOW (ref 3.5–5.0)
Anion gap: 10 (ref 5–15)
BUN: 13 mg/dL (ref 8–23)
CO2: 21 mmol/L — ABNORMAL LOW (ref 22–32)
Calcium: 8.2 mg/dL — ABNORMAL LOW (ref 8.9–10.3)
Chloride: 104 mmol/L (ref 98–111)
Creatinine, Ser: 1.22 mg/dL (ref 0.61–1.24)
GFR, Estimated: >60 mL/min (ref >60)
Glucose, Bld: 148 mg/dL — ABNORMAL HIGH (ref 70–99)
Phosphorus: 3 mg/dL (ref 2.5–4.6)
Potassium: 3.4 mmol/L — ABNORMAL LOW (ref 3.5–5.1)
Sodium: 135 mmol/L (ref 135–145)

## 2023-10-28 LAB — PROTIME-INR
INR: 1.2 (ref 0.8–1.2)
Prothrombin Time: 15.4 s — ABNORMAL HIGH (ref 11.4–15.2)

## 2023-10-28 LAB — FOLATE: Folate: 12.8 ng/mL (ref >5.9)

## 2023-10-28 LAB — IRON AND TIBC
Iron: 66 ug/dL (ref 45–182)
Saturation Ratios: 31 % (ref 17.9–39.5)
TIBC: 211 ug/dL — ABNORMAL LOW (ref 250–450)
UIBC: 145 ug/dL

## 2023-10-28 LAB — FERRITIN: Ferritin: 403 ng/mL — ABNORMAL HIGH (ref 24–336)

## 2023-10-28 LAB — HEPARIN LEVEL (UNFRACTIONATED): Heparin Unfractionated: 0.31 [IU]/mL (ref 0.30–0.70)

## 2023-10-28 LAB — PREPARE RBC (CROSSMATCH)

## 2023-10-28 LAB — MAGNESIUM: Magnesium: 2.1 mg/dL (ref 1.7–2.4)

## 2023-10-28 LAB — VITAMIN B12: Vitamin B-12: 175 pg/mL — ABNORMAL LOW (ref 180–914)

## 2023-10-28 MED ORDER — POTASSIUM CHLORIDE CRYS ER 20 MEQ PO TBCR
60.0000 meq | EXTENDED_RELEASE_TABLET | Freq: Once | ORAL | Status: AC
  Administered 2023-10-28: 60 meq via ORAL
  Filled 2023-10-28: qty 3

## 2023-10-28 MED ORDER — SODIUM CHLORIDE 0.9% IV SOLUTION: Freq: Once | INTRAVENOUS | Status: DC

## 2023-10-28 MED ORDER — VITAMIN B-12 1000 MCG PO TABS
1000.0000 ug | ORAL_TABLET | Freq: Every day | ORAL | Status: DC
  Administered 2023-10-30 – 2023-11-06 (×8): 1000 ug via ORAL
  Filled 2023-10-28 (×8): qty 1

## 2023-10-28 MED ORDER — PANTOPRAZOLE SODIUM 40 MG IV SOLR
40.0000 mg | Freq: Two times a day (BID) | INTRAVENOUS | Status: DC
  Administered 2023-10-28 – 2023-10-30 (×4): 40 mg via INTRAVENOUS
  Filled 2023-10-28 (×4): qty 10

## 2023-10-28 MED ORDER — CYANOCOBALAMIN 1000 MCG/ML IJ SOLN
1000.0000 ug | Freq: Every day | INTRAMUSCULAR | Status: AC
  Administered 2023-10-28 – 2023-10-29 (×2): 1000 ug via INTRAMUSCULAR
  Filled 2023-10-28 (×2): qty 1

## 2023-10-28 NOTE — Progress Notes (Signed)
 PROGRESS NOTE  Jason Spencer WUJ:[id number] DOB: 1950/12/22   PCP: Yevette Hem, FNP  Patient is from: Home  DOA: 10/21/2023 LOS: 7  Chief complaints Chief Complaint  Patient presents with   Circulatory Problem     Brief Narrative / Interim history: 73 year old M with PMH of COPD, CAD, atrial fibrillation/flutter on warfarin, HTN, HLD, EtOH use, anxiety, pleural effusion, psoriasis, PAD and tobacco use disorder presenting with progressive left 4th and 5th toe pain and discoloration for about 2 weeks, and admitted with critical limb ischemia with left 4th and 5th toe gangrene, and fifth metatarsal head osteomyelitis.   He had mild leukocytosis with elevated CRP to 9.9.  Has no fever.  Pro-Cal negative.  Foot x-ray showed subcutaneous gas about the head of the fifth metatarsal and proximal phalanx of left fifth toe with cortical loss medially from the fifth metatarsal head consistent with osteomyelitis.  Blood cultures obtained.  Patient was started on broad-spectrum antibiotics.  Orthopedic surgery and vascular surgery consulted.   CT angio showed severe bilateral multifocal high-grade stenosis from femoral arteries down, and aortic atherosclerosis.    INR reversed with vitamin K and he underwent LLE angiogram with shockwave lithotripsy, drug-coated balloon angioplasty and stent to left SFA and above knee left popliteal artery on 4/17.  Started on Plavix  and aspirin .  Remains on broad-spectrum antibiotics.  Plan for transmetatarsal amputation on Wednesday.  Subjective: Seen and examined earlier this morning.  No major events overnight of this morning.  Continues to endorse intermittent foot pain.  No other complaints.  Hemoglobin dropped to 7.4 this morning.  No signs of overt bleeding but noted some dark stool yesterday.  Denies hematochezia.   Objective: Vitals:   10/27/23 1943 10/27/23 2043 10/28/23 0347 10/28/23 1009  BP: 114/77  105/65 (!) 116/59  Pulse: 90 (!) 108 77 92   Resp: 16 18 16 17   Temp: 97.9 F (36.6 C)  97.6 F (36.4 C)   TempSrc: Oral  Oral   SpO2: 96%  96% 96%  Weight:      Height:        Examination:  GENERAL: No apparent distress.  Nontoxic. HEENT: MMM.  Vision and hearing grossly intact.  NECK: Supple.  No apparent JVD.  RESP:  No IWOB.  Fair aeration bilaterally. CVS:  RRR. Heart sounds normal.  ABD/GI/GU: BS+. Abd soft, NTND.  MSK/EXT:  Moves extremities.  Dry gangrene in left 4th and 5th toe.  Faint DP pulses.  Significant muscle mass and subcu fat loss.  Erythema over the dorsum of left foot.  Dry skin in BLE.  See picture in media for more. SKIN: As above. NEURO: Awake, alert and oriented appropriately.  No apparent focal neuro deficit. PSYCH: Calm. Normal affect.   Consultants:  Orthopedic surgery Vascular surgery Gastroenterology  Procedures: 4/17-LLE angiogram with shockwave lithotripsy, drug-coated balloon angioplasty and stent to left SFA and above knee left popliteal artery.  See op note for details.  Microbiology summarized: MRSA PCR screen nonreactive Blood cultures NGTD  Assessment and plan: Left lower extremity critical limb ischemia with 4th and 5th dry gangrene: Present on arrival Severe bilateral PAD-as noted on CT angio Left foot osteomyelitis: Present on arrival -Left foot x-ray and CT angio as above. -S/p LLE angiogram with shockwave lithotripsy, drug-coated balloon angioplasty and stent to left SFA and above knee left popliteal artery by Dr. Fulton Job on 4/17.  -Continue aspirin  and Plavix  -Vancomycin >> Zyvox  -Continue IV Zosyn  -Continue home Crestor .  LDL only 48. -Emphasized  the importance of smoking cessation -Plan for transmetatarsal amputation on Wednesday   Chronic atrial fibrillation/flutter: Rate controlled.  On warfarin at home.  INR subtherapeutic after reversal for surgery -Hold heparin  given drop in hemoglobin -Continue holding warfarin in anticipation for surgical intervention -May  consider DOAC's if cost is not a barrier.  I do not see a contraindication -Consider home metoprolol   AKI: Baseline Cr~1.0.  AKI could be due to IV contrast, IV antibiotics and lisinopril .  Improved. Recent Labs    09/06/23 1124 10/21/23 1327 10/22/23 0346 10/23/23 0402 10/24/23 0350 10/25/23 0424 10/25/23 1951 10/26/23 0418 10/27/23 0819 10/28/23 0535  BUN 11 9 8  7* 5* 11 13 15 13 13   CREATININE 0.97 1.04 1.19 1.27* 1.36* 1.18 1.40* 1.49* 1.29* 1.22  - Vancomycin  changed to Zyvox . - Continue holding lisinopril  - Avoid nephrotoxic meds - Continue monitoring  ABLA superimposed on anemia of chronic disease: Hemoglobin dropped about 4 g after restarting anticoagulation with heparin .  No overt bleeding but he noted some dark stool on 4/19.  Anemia panel with vitamin B12 deficiency. Recent Labs    10/21/23 1327 10/22/23 0346 10/23/23 0402 10/24/23 0350 10/25/23 0424 10/25/23 1951 10/26/23 0418 10/27/23 0819 10/28/23 0535 10/28/23 1321  HGB 12.2* 11.9* 11.4* 11.0* 11.7* 11.6* 10.2* 8.7* 7.4* 7.7*  -Discontinue heparin .  Continue DAPT given recent stent unless profuse bleeding -Start IV Protonix  40 mg twice daily -Vitamin B12 injection 1000 mcg daily for 2 days followed by p.o. -Transfuse 1 unit. -Consult GI - Monitor H&H.  Transfuse for Hgb <8.0 given history of CAD  History of CAD: No cardiopulmonary symptoms. - Continue home metoprolol  and Crestor  - Now on aspirin  and Plavix  for PAD  Psoriasis: Did not follow-up with his dermatologist for a while - Continue clobetasol  cream twice daily   Essential hypertension: Normotensive for most part -Continue home amlodipine  and metoprolol  -Continue holding lisinopril   Chronic COPD: Stable -Continue home inhalers or hospital formulary -Encouraged smoking cessation  Alcohol abuse: No withdrawal symptoms.  CIWA 0. - Multivitamin, thiamine  and folic acid   Hypokalemia - Replenish and recheck as appropriate  Leukocytosis:  Improved. -Continue monitoring -Antibiotics as above  Body mass index is 25.12 kg/m.           DVT prophylaxis:  On full dose anticoagulation  Code Status: Full code Family Communication: None at bedside Level of care: Med-Surg Status is: Inpatient Remains inpatient appropriate because: Critical limb ischemia, osteomyelitis   Final disposition: Likely home   55 minutes with more than 50% spent in reviewing records, counseling patient/family and coordinating care.   Sch Meds:  Scheduled Meds:  amLODipine   5 mg Oral Daily   aspirin   81 mg Oral Daily   budeson-glycopyrrolate -formoterol   2 puff Inhalation BID   clobetasol  cream   Topical BID   clopidogrel   75 mg Oral Daily   folic acid   1 mg Oral Daily   linezolid   600 mg Oral Q12H   metoprolol  succinate  100 mg Oral Daily   multivitamin with minerals  1 tablet Oral Daily   rosuvastatin   20 mg Oral Daily   sodium chloride  flush  3 mL Intravenous Q12H   sodium chloride  flush  3 mL Intravenous Q12H   thiamine   100 mg Oral Daily   Continuous Infusions:  piperacillin -tazobactam 3.375 g (10/28/23 1407)   PRN Meds:.acetaminophen  **OR** acetaminophen , acetaminophen , hydrALAZINE , HYDROcodone -acetaminophen , labetalol , morphine  injection, polyethylene glycol, sodium chloride  flush  Antimicrobials: Anti-infectives (From admission, onward)    Start  Dose/Rate Route Frequency Ordered Stop   10/22/23 1045  linezolid  (ZYVOX ) tablet 600 mg        600 mg Oral Every 12 hours 10/22/23 0954     10/22/23 0600  vancomycin  (VANCOCIN ) IVPB 1000 mg/200 mL premix  Status:  Discontinued        1,000 mg 200 mL/hr over 60 Minutes Intravenous Every 12 hours 10/21/23 1738 10/21/23 1743   10/21/23 2200  piperacillin -tazobactam (ZOSYN ) IVPB 3.375 g       Placed in "Followed by" Linked Group   3.375 g 12.5 mL/hr over 240 Minutes Intravenous Every 8 hours 10/21/23 1504     10/21/23 1745  linezolid  (ZYVOX ) IVPB 600 mg  Status:  Discontinued         600 mg 300 mL/hr over 60 Minutes Intravenous Every 12 hours 10/21/23 1744 10/22/23 0954   10/21/23 1530  clindamycin  (CLEOCIN ) IVPB 600 mg  Status:  Discontinued        600 mg 100 mL/hr over 30 Minutes Intravenous  Once 10/21/23 1523 10/21/23 1715   10/21/23 1515  vancomycin  (VANCOREADY) IVPB 1750 mg/350 mL  Status:  Discontinued        1,750 mg 175 mL/hr over 120 Minutes Intravenous  Once 10/21/23 1504 10/21/23 1743   10/21/23 1515  piperacillin -tazobactam (ZOSYN ) IVPB 3.375 g       Placed in "Followed by" Linked Group   3.375 g 100 mL/hr over 30 Minutes Intravenous  Once 10/21/23 1504 10/21/23 1804        I have personally reviewed the following labs and images: CBC: Recent Labs  Lab 10/23/23 0402 10/24/23 0350 10/25/23 0424 10/25/23 1951 10/26/23 0418 10/27/23 0819 10/28/23 0535 10/28/23 1321  WBC 12.1* 10.5 11.4*  --  14.7* 12.9* 11.3* 12.1*  NEUTROABS 9.2* 7.9* 8.7*  --  12.2* 10.3*  --   --   HGB 11.4* 11.0* 11.7* 11.6* 10.2* 8.7* 7.4* 7.7*  HCT 35.1* 34.7* 36.3* 34.0* 32.0* 27.5* 23.0* 24.3*  MCV 96.2 97.2 95.8  --  97.9 98.2 98.7 98.8  PLT 372 345 372  --  367 311 260 274   BMP &GFR Recent Labs  Lab 10/24/23 0350 10/25/23 0424 10/25/23 1951 10/26/23 0418 10/27/23 0819 10/28/23 0535  NA 136 138 139 138 138 135  K 3.7 3.9 4.1 4.0 3.6 3.4*  CL 106 103 108 103 104 104  CO2 22 20*  --  20* 24 21*  GLUCOSE 88 87 110* 101* 97 148*  BUN 5* 11 13 15 13 13   CREATININE 1.36* 1.18 1.40* 1.49* 1.29* 1.22  CALCIUM  8.3* 8.7*  --  8.5* 8.6* 8.2*  MG 1.9 2.0  --  2.1 2.1 2.1  PHOS  --   --   --   --  2.8 3.0   Estimated Creatinine Clearance: 58.2 mL/min (by C-G formula based on SCr of 1.22 mg/dL). Liver & Pancreas: Recent Labs  Lab 10/22/23 0346 10/27/23 0819 10/28/23 0535  AST 20  --   --   ALT 12  --   --   ALKPHOS 49  --   --   BILITOT 0.6  --   --   PROT 7.4  --   --   ALBUMIN 2.2* 2.2* 1.9*   No results for input(s): "LIPASE", "AMYLASE" in the  last 168 hours. No results for input(s): "AMMONIA" in the last 168 hours. Diabetic: No results for input(s): "HGBA1C" in the last 72 hours. No results for input(s): "GLUCAP" in the last  168 hours. Cardiac Enzymes: No results for input(s): "CKTOTAL", "CKMB", "CKMBINDEX", "TROPONINI" in the last 168 hours. No results for input(s): "PROBNP" in the last 8760 hours. Coagulation Profile: Recent Labs  Lab 10/24/23 2024 10/25/23 0424 10/26/23 0418 10/27/23 0819 10/28/23 0535  INR 1.7* 1.4* 1.4* 1.3* 1.2   Thyroid  Function Tests: No results for input(s): "TSH", "T4TOTAL", "FREET4", "T3FREE", "THYROIDAB" in the last 72 hours. Lipid Profile: Recent Labs    10/26/23 0418  CHOL 117  HDL 35*  LDLCALC 48  TRIG 960*  CHOLHDL 3.3   Anemia Panel: Recent Labs    10/28/23 0535  VITAMINB12 175*  FOLATE 12.8  FERRITIN 403*  TIBC 211*  IRON 66  RETICCTPCT 1.5   Urine analysis:    Component Value Date/Time   COLORURINE YELLOW 08/13/2014 1330   APPEARANCEUR CLEAR 08/13/2014 1330   LABSPEC 1.010 08/13/2014 1330   PHURINE 7.5 08/13/2014 1330   GLUCOSEU NEGATIVE 08/13/2014 1330   HGBUR NEGATIVE 08/13/2014 1330   BILIRUBINUR NEGATIVE 08/13/2014 1330   KETONESUR NEGATIVE 08/13/2014 1330   PROTEINUR NEGATIVE 08/13/2014 1330   UROBILINOGEN 0.2 08/13/2014 1330   NITRITE NEGATIVE 08/13/2014 1330   LEUKOCYTESUR NEGATIVE 08/13/2014 1330   Sepsis Labs: Invalid input(s): "PROCALCITONIN", "LACTICIDVEN"  Microbiology: Recent Results (from the past 240 hours)  Blood culture (routine x 2)     Status: None   Collection Time: 10/21/23  1:30 PM   Specimen: BLOOD LEFT ARM  Result Value Ref Range Status   Specimen Description BLOOD LEFT ARM  Final   Special Requests   Final    BOTTLES DRAWN AEROBIC AND ANAEROBIC Blood Culture results may not be optimal due to an inadequate volume of blood received in culture bottles   Culture   Final    NO GROWTH 5 DAYS Performed at [facility],  [address] [place], [place] [address]    Report Status 10/26/2023 FINAL  Final  Blood culture (routine x 2)     Status: None   Collection Time: 10/21/23  2:24 PM   Specimen: BLOOD  Result Value Ref Range Status   Specimen Description BLOOD RIGHT ANTECUBITAL  Final   Special Requests   Final    BOTTLES DRAWN AEROBIC AND ANAEROBIC Blood Culture adequate volume   Culture   Final    NO GROWTH 5 DAYS Performed at [facility], [address], [place], [place] [address]    Report Status 10/26/2023 FINAL  Final  MRSA Next Gen by PCR, Nasal     Status: None   Collection Time: 10/21/23  9:15 PM   Specimen: Nasal Mucosa; Nasal Swab  Result Value Ref Range Status   MRSA by PCR Next Gen NOT DETECTED NOT DETECTED Final    Comment: (NOTE) The GeneXpert MRSA Assay (FDA approved for NASAL specimens only), is one component of a comprehensive MRSA colonization surveillance program. It is not intended to diagnose MRSA infection nor to guide or monitor treatment for MRSA infections. Test performance is not FDA approved in patients less than 71 years old. Performed at [facility], [address], [place], [place] [address]     Radiology Studies: No results found.     Marien Manship T. Aviah Sorci [facility]  If 7PM-7AM, please contact night-coverage [email or web address] 10/28/2023, 4:30 PM

## 2023-10-28 NOTE — Plan of Care (Signed)

## 2023-10-28 NOTE — Progress Notes (Signed)
 PHARMACY - ANTICOAGULATION CONSULT NOTE  Pharmacy Consult for Heparin   (warfarin on hold) Indication: atrial fibrillation and limb ischemia  Allergies  Allergen Reactions   Shellfish Allergy Nausea And Vomiting and Other (See Comments)    Shrimp, especially    Patient Measurements: Height: 5\' 10"  (177.8 cm) Weight: 79.4 kg (175 lb 0.7 oz) IBW/kg (Calculated) : 73 HEPARIN  DW (KG): 86.2  Vital Signs: Temp: 97.6 F (36.4 C) (04/20 0347) Temp Source: Oral (04/20 0347) BP: 105/65 (04/20 0347) Pulse Rate: 77 (04/20 0347)  Labs: Recent Labs    10/26/23 0418 10/26/23 0742 10/27/23 0819 10/27/23 1816 10/28/23 0535  HGB 10.2*  --  8.7*  --  7.4*  HCT 32.0*  --  27.5*  --  23.0*  PLT 367  --  311  --  260  LABPROT 17.1*  --  15.9*  --  15.4*  INR 1.4*  --  1.3*  --  1.2  HEPARINUNFRC  --    < > 0.38 0.33 0.31  CREATININE 1.49*  --  1.29*  --  1.22   < > = values in this interval not displayed.    Estimated Creatinine Clearance: 58.2 mL/min (by C-G formula based on SCr of 1.22 mg/dL).  Assessment: 89 yom with a history of HF, AF on warfarin, CAD, COPD, PAD . Patient presenting with toe pain, reports 5th and 4th digit on his left foot gradually started becoming black proximately 2 weeks ago. Heparin  per pharmacy consult placed for atrial fibrillation and limb ischemia .  Holding warfarin.  4/20: He is s/p drug-coated balloon angioplasty/stent on 4/17. Heparin  level remains therapeutic at 0.31, on 1450 units/hr. Hgb 7.4, plt 260 - monitor. No s/sx of bleeding or infusion issues per RN. Due to heparin  level being at lower end of therapeutic, will increase heparin  infusion rate slightly.  Goal of Therapy:  Heparin  level 0.3-0.7 units/ml Monitor platelets by anticoagulation protocol: Yes   Plan:  Continue heparin  infusion at 1500 units/hr Check heparin  level daily while on heparin  Daily PT/INR, CBC Warfarin on hold for amputation next week.  Thank you for allowing pharmacy  to participate in this patient's care.  [name], PharmD PGY1 Pharmacy Resident 10/28/2023 7:13 AM  Please check AMION for all [facility] Pharmacy phone numbers After 10:00 PM, call Main Pharmacy [phone]

## 2023-10-28 NOTE — Plan of Care (Signed)
  Problem: Education: Goal: Knowledge of General Education information will improve Description: Including pain rating scale, medication(s)/side effects and non-pharmacologic comfort measures Outcome: Progressing   Problem: Health Behavior/Discharge Planning: Goal: Ability to manage health-related needs will improve Outcome: Progressing   Problem: Clinical Measurements: Goal: Ability to maintain clinical measurements within normal limits will improve Outcome: Progressing   Problem: Activity: Goal: Risk for activity intolerance will decrease Outcome: Progressing   Problem: Nutrition: Goal: Adequate nutrition will be maintained Outcome: Progressing   Problem: Coping: Goal: Level of anxiety will decrease Outcome: Progressing   Problem: Elimination: Goal: Will not experience complications related to bowel motility Outcome: Progressing Goal: Will not experience complications related to urinary retention Outcome: Progressing   Problem: Pain Managment: Goal: General experience of comfort will improve and/or be controlled Outcome: Progressing   Problem: Safety: Goal: Ability to remain free from injury will improve Outcome: Progressing

## 2023-10-29 ENCOUNTER — Inpatient Hospital Stay (HOSPITAL_COMMUNITY)

## 2023-10-29 DIAGNOSIS — Z7902 Long term (current) use of antithrombotics/antiplatelets: Secondary | ICD-10-CM

## 2023-10-29 DIAGNOSIS — R195 Other fecal abnormalities: Secondary | ICD-10-CM

## 2023-10-29 DIAGNOSIS — F411 Generalized anxiety disorder: Secondary | ICD-10-CM | POA: Diagnosis not present

## 2023-10-29 DIAGNOSIS — M869 Osteomyelitis, unspecified: Secondary | ICD-10-CM | POA: Diagnosis not present

## 2023-10-29 DIAGNOSIS — I4892 Unspecified atrial flutter: Secondary | ICD-10-CM | POA: Diagnosis not present

## 2023-10-29 DIAGNOSIS — D62 Acute posthemorrhagic anemia: Secondary | ICD-10-CM

## 2023-10-29 DIAGNOSIS — I739 Peripheral vascular disease, unspecified: Secondary | ICD-10-CM | POA: Diagnosis not present

## 2023-10-29 LAB — RENAL FUNCTION PANEL
Albumin: 2.1 g/dL — ABNORMAL LOW (ref 3.5–5.0)
Anion gap: 10 (ref 5–15)
BUN: 8 mg/dL (ref 8–23)
CO2: 23 mmol/L (ref 22–32)
Calcium: 8.7 mg/dL — ABNORMAL LOW (ref 8.9–10.3)
Chloride: 105 mmol/L (ref 98–111)
Creatinine, Ser: 1.1 mg/dL (ref 0.61–1.24)
GFR, Estimated: >60 mL/min (ref >60)
Glucose, Bld: 85 mg/dL (ref 70–99)
Phosphorus: 3.2 mg/dL (ref 2.5–4.6)
Potassium: 4.4 mmol/L (ref 3.5–5.1)
Sodium: 138 mmol/L (ref 135–145)

## 2023-10-29 LAB — CBC
HCT: 26.9 % — ABNORMAL LOW (ref 39.0–52.0)
Hemoglobin: 8.5 g/dL — ABNORMAL LOW (ref 13.0–17.0)
MCH: 30 pg (ref 26.0–34.0)
MCHC: 31.6 g/dL (ref 30.0–36.0)
MCV: 95.1 fL (ref 80.0–100.0)
Platelets: 267 10*3/uL (ref 150–400)
RBC: 2.83 MIL/uL — ABNORMAL LOW (ref 4.22–5.81)
RDW: 15 % (ref 11.5–15.5)
WBC: 11.3 10*3/uL — ABNORMAL HIGH (ref 4.0–10.5)
nRBC: 0 % (ref 0.0–0.2)

## 2023-10-29 LAB — MAGNESIUM: Magnesium: 2 mg/dL (ref 1.7–2.4)

## 2023-10-29 LAB — TYPE AND SCREEN
ABO/RH(D): O POS
Antibody Screen: NEGATIVE
Unit division: 0

## 2023-10-29 LAB — BPAM RBC
Blood Product Expiration Date: 202505172359
ISSUE DATE / TIME: 202504202051
Unit Type and Rh: 5100

## 2023-10-29 LAB — HEPARIN LEVEL (UNFRACTIONATED): Heparin Unfractionated: <0.1 [IU]/mL — ABNORMAL LOW (ref 0.30–0.70)

## 2023-10-29 LAB — PROTIME-INR
INR: 1.1 (ref 0.8–1.2)
Prothrombin Time: 14.9 s (ref 11.4–15.2)

## 2023-10-29 MED ORDER — IOHEXOL 350 MG/ML SOLN
75.0000 mL | Freq: Once | INTRAVENOUS | Status: AC | PRN
  Administered 2023-10-29: 75 mL via INTRAVENOUS

## 2023-10-29 NOTE — Plan of Care (Signed)

## 2023-10-29 NOTE — Consult Note (Addendum)
 Consultation  Referring Provider: [facility]/ Mae Schlossman Primary Care Physician:  Yevette Hem, FNP Primary Gastroenterologist:  unassigned  Reason for Consultation:  dark stool, anemia, drop in hgb   Attending physician's note  I personally saw the patient and performed a substantive portion of this encounter (>50% time spent), including a complete performance of at least one of the key components (MDM, Hx and/or Exam), in conjunction with the APP.  I agree with the APP's note, impression, and recommendations with additional input as follows.     89 yr M with L foot gangrene and osteomyelitis with acute drop in Hgb Heme positive dark stool but no overt GI bleed doesn't account for degree of acute drop in Hgb He does have a significant bruise in the groin at the site of angio Will obtain CT abd & pelvis to exclude any hematoma or aneurysm EGD to exclude any high risk upper GI lesions, he will likely need dual antiplatelet and anticoagulation s/p angio with stent placement NPO after midnight Cont PPI Monitor Hgb and transfuse as needed  The patient was provided an opportunity to ask questions and all were answered. The patient agreed with the plan and demonstrated an understanding of the instructions.  Lorena Rolling , MD [phone]     HPI: Jason Spencer is a 73 y.o. male who was admitted 1 week ago with worsening pain in the toes of his left foot.  Subsequently diagnosed with critical left foot gangrene and osteomyelitis.  He had been on Coumadin , was started on vancomycin  and Zosyn . He underwent lower extremity angio on 10/25/2023 with lithotripsy, and drug-coated angioplasty to the left popliteal and angio with stent placement to the left SFA.  He has been on heparin , Plavix  and aspirin  since. Plan per orthopedics is for partial amputation this week on Wednesday.  Patient also has multiple other comorbidities including hypertension, atrial fibs flutter, coronary artery disease, COPD,  history of long QT, congestive heart failure and history of EtOH use. On 416 his hemoglobin was 11> 8.7 on 10/27/2023> 7.7 yesterday and 7.4 last pm Stool documented heme positive. INR 1.1 Iron studies reviewed and not consistent with iron deficiency He was transfused 1 unit of packed RBCs and hemoglobin up to 8.5 today. Patient has not had any prior GI evaluation, says he has not had any prior issues with GI bleeding.  He denies any problems with abdominal pain or discomfort, no constipation diarrhea, no nausea or vomiting, no heartburn or indigestion, no dysphagia. He just had a bowel movement this morning which is in the bedside commode, this is dark greenish-brown, no overt melena.   Past Medical History:  Diagnosis Date   Acute CHF (HCC)    Maximo Spar 08/13/2014   Atrial fibrillation with RVR (HCC) 08/13/2014   new onset/notes 08/13/2014   CAP (community acquired pneumonia) 07/2014   Cardiomyopathy (HCC)    Migraine 1970     Past Surgical History:  Procedure Laterality Date   CARDIOVERSION N/A 08/19/2014   Procedure: CARDIOVERSION;  Surgeon: Darlis Eisenmenger, MD;  Location: [facility] ENDOSCOPY;  Service: Cardiovascular;  Laterality: N/A;   ENDOVENOUS ABLATION SAPHENOUS VEIN W/ LASER Right 05/01/2018   endovenous laser ablation right greater saphenous vein by Stacia Dynes MD    LEFT AND RIGHT HEART CATHETERIZATION WITH CORONARY ANGIOGRAM N/A 08/17/2014   Procedure: LEFT AND RIGHT HEART CATHETERIZATION WITH CORONARY ANGIOGRAM;  Surgeon: Mickiel Albany, MD;  Location: [facility] CATH LAB;  Service: Cardiovascular;  Laterality: N/A;   LOWER  EXTREMITY ANGIOGRAPHY Left 10/25/2023   Procedure: Lower Extremity Angiography;  Surgeon: Young Hensen, MD;  Location: [facility] INVASIVE CV LAB;  Service: Cardiovascular;  Laterality: Left;   LOWER EXTREMITY INTERVENTION Left 10/25/2023   Procedure: LOWER EXTREMITY INTERVENTION;  Surgeon: Young Hensen, MD;  Location: [facility] INVASIVE CV LAB;  Service: Cardiovascular;   Laterality: Left;   NO PAST SURGERIES     PERCUTANEOUS CORONARY STENT INTERVENTION (PCI-S)  08/17/2014   Procedure: PERCUTANEOUS CORONARY STENT INTERVENTION (PCI-S);  Surgeon: Mickiel Albany, MD;  Location: [facility] CATH LAB;  Service: Cardiovascular;;   TEE WITHOUT CARDIOVERSION N/A 08/19/2014   Procedure: TRANSESOPHAGEAL ECHOCARDIOGRAM (TEE);  Surgeon: Darlis Eisenmenger, MD;  Location: [facility] ENDOSCOPY;  Service: Cardiovascular;  Laterality: N/A;    Prior to Admission medications   Medication Sig Start Date End Date Taking? Authorizing Provider  amLODipine  (NORVASC ) 5 MG tablet TAKE ONE TABLET ONCE DAILY 09/26/23  Yes Hawks, Christy A, FNP  Budeson-Glycopyrrol-Formoterol  (BREZTRI  AEROSPHERE) 160-9-4.8 MCG/ACT AERO Inhale 2 puffs into the lungs 2 (two) times daily. Patient taking differently: Inhale 1 puff into the lungs daily. 09/15/22  Yes Hawks, Christy A, FNP  lisinopril  (ZESTRIL ) 40 MG tablet TAKE ONE TABLET ONCE DAILY 09/26/23  Yes Hawks, Kathaleen Pale A, FNP  metoprolol  succinate (TOPROL -XL) 100 MG 24 hr tablet TAKE 1 TABLET WITH OR IMMEDIATELY FOLLOWING A MEAL 09/26/23  Yes Hawks, Christy A, FNP  VASELINE PURE ULTRA WHITE ointment Apply 1 application  topically as needed (for psoriasis- affected areas).   Yes [provider]  warfarin (COUMADIN ) 5 MG tablet TAKE ONE TABLET ON MONDAY, WEDNESDAY, AND FRIDAY. TAKE 1 AND 1/2 TABLETS ON ALL other DAYS Patient taking differently: Take 5-7.5 mg by mouth See admin instructions. Take 7.5 mg by mouth in the morning on Sun/Tues/Thurs/Sat and 5 mg on Mon/Wed/Fri 09/21/23  Yes Hawks, Christy A, FNP  lidocaine  4 % Place 2 patches onto the skin daily. Patient not taking: Reported on 10/21/2023 07/06/23   Chrystine Crate, FNP  rosuvastatin  (CRESTOR ) 5 MG tablet Take 1 tablet (5 mg total) by mouth daily. Patient not taking: Reported on 10/21/2023 09/15/22   Tommas Fragmin A, FNP  triamcinolone  ointment (KENALOG ) 0.5 % Apply 1 Application topically 2 (two) times  daily. Patient not taking: Reported on 10/21/2023 05/10/23   Yevette Hem, FNP    Current Facility-Administered Medications  Medication Dose Route Frequency Provider Last Rate Last Admin   0.9 %  sodium chloride  infusion (Manually program via Guardrails IV Fluids)   Intravenous Once Gonfa, Taye T, MD       acetaminophen  (TYLENOL ) tablet 650 mg  650 mg Oral Q6H PRN Young Hensen, MD       Or   acetaminophen  (TYLENOL ) suppository 650 mg  650 mg Rectal Q6H PRN Young Hensen, MD       acetaminophen  (TYLENOL ) tablet 650 mg  650 mg Oral Q4H PRN Young Hensen, MD       amLODipine  (NORVASC ) tablet 5 mg  5 mg Oral Daily Young Hensen, MD   5 mg at 10/28/23 1009   aspirin  chewable tablet 81 mg  81 mg Oral Daily Young Hensen, MD   81 mg at 10/28/23 1009   budeson-glycopyrrolate -formoterol  (BREZTRI ) 160-9-4.8 MCG/ACT inhaler 2 puff  2 puff Inhalation BID Young Hensen, MD   2 puff at 10/29/23 0801   clobetasol  cream (TEMOVATE ) 0.05 %   Topical BID Young Hensen, MD   Given at 10/28/23 1016  clopidogrel  (PLAVIX ) tablet 75 mg  75 mg Oral Daily Elgergawy, Dawood S, MD   75 mg at 10/28/23 1009   cyanocobalamin  (VITAMIN B12) injection 1,000 mcg  1,000 mcg Intramuscular Daily Gonfa, Taye T, MD   1,000 mcg at 10/28/23 1844   Followed by   Cecily Cohen ON 10/30/2023] cyanocobalamin  (VITAMIN B12) tablet 1,000 mcg  1,000 mcg Oral Daily Gonfa, Taye T, MD       folic acid  (FOLVITE ) tablet 1 mg  1 mg Oral Daily Gonfa, Taye T, MD   1 mg at 10/28/23 1009   hydrALAZINE  (APRESOLINE ) injection 5 mg  5 mg Intravenous Q20 Min PRN Young Hensen, MD       HYDROcodone -acetaminophen  (NORCO/VICODIN) 5-325 MG per tablet 1 tablet  1 tablet Oral Q4H PRN Gonfa, Taye T, MD   1 tablet at 10/29/23 0122   labetalol  (NORMODYNE ) injection 10 mg  10 mg Intravenous Q10 min PRN Young Hensen, MD       linezolid  (ZYVOX ) tablet 600 mg  600 mg Oral Q12H Young Hensen, MD   600  mg at 10/28/23 2118   metoprolol  succinate (TOPROL -XL) 24 hr tablet 100 mg  100 mg Oral Daily Young Hensen, MD   100 mg at 10/28/23 1009   morphine  (PF) 2 MG/ML injection 1 mg  1 mg Intravenous Q4H PRN Young Hensen, MD   1 mg at 10/28/23 1640   multivitamin with minerals tablet 1 tablet  1 tablet Oral Daily Gonfa, Taye T, MD   1 tablet at 10/28/23 1010   pantoprazole  (PROTONIX ) injection 40 mg  40 mg Intravenous Q12H Gonfa, Taye T, MD   40 mg at 10/28/23 1844   piperacillin -tazobactam (ZOSYN ) IVPB 3.375 g  3.375 g Intravenous Q8H Young Hensen, MD 12.5 mL/hr at 10/29/23 0002 3.375 g at 10/29/23 0002   polyethylene glycol (MIRALAX  / GLYCOLAX ) packet 17 g  17 g Oral Daily PRN Young Hensen, MD       rosuvastatin  (CRESTOR ) tablet 20 mg  20 mg Oral Daily Klus, Jesse L, RPH   20 mg at 10/28/23 1010   sodium chloride  flush (NS) 0.9 % injection 3 mL  3 mL Intravenous Q12H Young Hensen, MD   3 mL at 10/28/23 2200   sodium chloride  flush (NS) 0.9 % injection 3 mL  3 mL Intravenous Q12H Young Hensen, MD   3 mL at 10/28/23 2200   sodium chloride  flush (NS) 0.9 % injection 3 mL  3 mL Intravenous PRN Young Hensen, MD       thiamine  (VITAMIN B1) tablet 100 mg  100 mg Oral Daily Gonfa, Taye T, MD   100 mg at 10/28/23 1009    Allergies as of 10/21/2023 - Review Complete 10/21/2023  Allergen Reaction Noted   Shellfish allergy Nausea And Vomiting and Other (See Comments) 05/01/2018    Family History  Problem Relation Age of Onset   Heart disease Mother        VALUE REPLACEMENT   COPD Father    Aneurysm Father     Social History   Socioeconomic History   Marital status: Single    Spouse name: Not on file   Number of children: Not on file   Years of education: Not on file   Highest education level: Not on file  Occupational History   Not on file  Tobacco Use   Smoking status: Every Day    Current packs/day: 0.50  Average packs/day: 0.5  packs/day for 44.0 years (22.0 ttl pk-yrs)    Types: Cigarettes   Smokeless tobacco: Never  Vaping Use   Vaping status: Never Used  Substance and Sexual Activity   Alcohol use: Yes    Alcohol/week: 0.0 standard drinks of alcohol    Comment: 08/13/2014 "I haven't drank in a month or so; if I drink it would be beer on a warm day"   Drug use: No   Sexual activity: Not Currently  Other Topics Concern   Not on file  Social History Narrative   Not on file   Social Drivers of Health   Financial Resource Strain: Low Risk  (01/08/2023)   Overall Financial Resource Strain (CARDIA)    Difficulty of Paying Living Expenses: Not hard at all  Food Insecurity: No Food Insecurity (10/21/2023)   Hunger Vital Sign    Worried About Running Out of Food in the Last Year: Never true    Ran Out of Food in the Last Year: Never true  Transportation Needs: No Transportation Needs (10/21/2023)   PRAPARE - Administrator, Civil Service (Medical): No    Lack of Transportation (Non-Medical): No  Physical Activity: Insufficiently Active (01/08/2023)   Exercise Vital Sign    Days of Exercise per Week: 2 days    Minutes of Exercise per Session: 20 min  Stress: Not on file  Social Connections: Moderately Integrated (10/22/2023)   Social Connection and Isolation Panel [NHANES]    Frequency of Communication with Friends and Family: Three times a week    Frequency of Social Gatherings with Friends and Family: Three times a week    Attends Religious Services: 1 to 4 times per year    Active Member of Clubs or Organizations: No    Attends Banker Meetings: 1 to 4 times per year    Marital Status: Never married  Intimate Partner Violence: Not At Risk (10/21/2023)   Humiliation, Afraid, Rape, and Kick questionnaire    Fear of Current or Ex-Partner: No    Emotionally Abused: No    Physically Abused: No    Sexually Abused: No    Review of Systems: Pertinent positive and negative review of  systems were noted in the above HPI section.  All other review of systems was otherwise negative.   Physical Exam: Vital signs in last 24 hours: Temp:  [97.5 F (36.4 C)-98.7 F (37.1 C)] 98.7 F (37.1 C) (04/21 0341) Pulse Rate:  [56-97] 68 (04/21 0341) Resp:  [17-20] 18 (04/21 0341) BP: (116-128)/(51-71) 118/51 (04/21 0341) SpO2:  [94 %-100 %] 94 % (04/21 0802) Last BM Date : 10/25/23 General:   Alert,  Well-developed, well-nourished, older white male pleasant and cooperative in NAD, sitting on bed Head:  Normocephalic and atraumatic. Eyes:  Sclera clear, no icterus.   Conjunctiva pink. Ears:  Normal auditory acuity. Nose:  No deformity, discharge,  or lesions. Mouth:  No deformity or lesions.   Neck:  Supple; no masses or thyromegaly. Lungs:  Clear throughout to auscultation.   No wheezes, crackles, or rhonchi.  Heart:  irRegular rate and rhythm; no murmurs, clicks, rubs,  or gallops. Abdomen:  Soft,nontender, BS active,nonpalp mass or hsm.   Rectal: Not done, with no stool in the bedside commode dark green-brown no overt melena Msk:  Symmetrical without gross deformities. .  Extremities:  Without clubbing or edema. Neurologic:  Alert and  oriented x4;  grossly normal neurologically. Skin:  Intact without significant lesions  or rashes.. Psych:  Alert and cooperative. Normal mood and affect.  Intake/Output from previous day: 04/20 0701 - 04/21 0700 In: 458.3 [I.V.:100; Blood:358.3] Out: 200 [Urine:200] Intake/Output this shift: No intake/output data recorded.  Lab Results: Recent Labs    10/28/23 0535 10/28/23 1321 10/29/23 0544  WBC 11.3* 12.1* 11.3*  HGB 7.4* 7.7* 8.5*  HCT 23.0* 24.3* 26.9*  PLT 260 274 267   BMET Recent Labs    10/27/23 0819 10/28/23 0535 10/29/23 0544  NA 138 135 138  K 3.6 3.4* 4.4  CL 104 104 105  CO2 24 21* 23  GLUCOSE 97 148* 85  BUN 13 13 8   CREATININE 1.29* 1.22 1.10  CALCIUM  8.6* 8.2* 8.7*   LFT Recent Labs     10/29/23 0544  ALBUMIN 2.1*   PT/INR Recent Labs    10/28/23 0535 10/29/23 0544  LABPROT 15.4* 14.9  INR 1.2 1.1   Hepatitis Panel No results for input(s): "HEPBSAG", "HCVAB", "HEPAIGM", "HEPBIGM" in the last 72 hours.   IMPRESSION:  #51 73 year old white male admitted with worsening pain in his left toes, and since found to have gangrene of the left forefoot, and osteomyelitis as well as limb ischemia.. He has been on IV antibiotics, underwent angioplasty of the left popliteal and angio with stent placement to the left SFA on 10/25/2023. Had been on heparin /Plavix /aspirin . Plan is for partial amputation later this week  #2 anemia normocytic, with drop in hemoglobin of over 3-4 g since admission. Stool documented heme positive, no overt melena. Transfused 1 unit yesterday with hemoglobin 8.5 today  As he is not having overt melena not sure that GI bleeding explains drop in hemoglobin of 3 g. Certainly he is at risk for gastropathy, stress ulceration, and has never had any GI evaluation so could bleed from a number of mucosal processes.  Also would consider intra-abdominal bleed in setting of heparin /Plavix /aspirin   #3 history of atrial fibs flutter 4.  Coronary artery disease 5.  COPD 6.  Long QT 7.  Congestive heart failure-preserved EF 8.  History of EtOH use  PLAN: Okay for diet today, n.p.o. past midnight Continue IV PPI twice daily Patient tentatively scheduled for EGD with Dr. Dietra Fraction tomorrow 10/30/2023 in AM.  Procedure was discussed in detail with the patient including indications risks and benefits and he is agreeable to proceed.  Continue to trend hemoglobin and transfuse as indicated Consider CT of the abdomen and pelvis today, will discuss.    Amy EsterwoodPA-C  10/29/2023, 8:36 AM

## 2023-10-29 NOTE — TOC Progression Note (Signed)
 Transition of Care [facility]) - Progression Note    Patient Details  Name: Jason Spencer MRN: [id number] Date of Birth: October 10, 1950  [facility] CM/SW Contact  Carmon Christen, LCSWA Phone Number: 10/29/2023, 3:14 PM  Clinical Narrative:     CSW provided patient with SNF bed offers. Patient inquired about [facility] to see if they can offer SNF bed for patient. CSW called [facility] who confirmed they will review referral. CSW awaiting call back to see if they can offer SNF bed for patient. CSW will continue to follow.  Expected Discharge Plan: Home/Self Care Barriers to Discharge: Continued Medical Work up  Expected Discharge Plan and Services       Living arrangements for the past 2 months: Single Family Home                                       Social Determinants of Health (SDOH) Interventions SDOH Screenings   Food Insecurity: No Food Insecurity (10/21/2023)  Housing: Low Risk  (10/21/2023)  Transportation Needs: No Transportation Needs (10/21/2023)  Utilities: Not At Risk (10/21/2023)  Depression (PHQ2-9): Low Risk  (07/06/2023)  Financial Resource Strain: Low Risk  (01/08/2023)  Physical Activity: Insufficiently Active (01/08/2023)  Social Connections: Moderately Integrated (10/22/2023)  Tobacco Use: High Risk (10/21/2023)    Readmission Risk Interventions     No data to display

## 2023-10-29 NOTE — Progress Notes (Signed)
 PROGRESS NOTE  Graves Nipp RUE:[id number] DOB: [birth year]   PCP: Yevette Hem, FNP  Patient is from: Home  DOA: 10/21/2023 LOS: 8  Chief complaints Chief Complaint  Patient presents with   Circulatory Problem     Brief Narrative / Interim history: 73 year old M with PMH of COPD, CAD, atrial fibrillation/flutter on warfarin, HTN, HLD, EtOH use, anxiety, pleural effusion, psoriasis, PAD and tobacco use disorder presenting with progressive left 4th and 5th toe pain and discoloration for about 2 weeks, and admitted with critical limb ischemia with left 4th and 5th toe gangrene, and fifth metatarsal head osteomyelitis.   He had mild leukocytosis with elevated CRP to 9.9.  Has no fever.  Pro-Cal negative.  Foot x-ray showed subcutaneous gas about the head of the fifth metatarsal and proximal phalanx of left fifth toe with cortical loss medially from the fifth metatarsal head consistent with osteomyelitis.  Blood cultures obtained.  Patient was started on broad-spectrum antibiotics.  Orthopedic surgery and vascular surgery consulted.   CT angio showed severe bilateral multifocal high-grade stenosis from femoral arteries down, and aortic atherosclerosis.    INR reversed with vitamin K and he underwent LLE angiogram with shockwave lithotripsy, drug-coated balloon angioplasty and stent to left SFA and above knee left popliteal artery on 4/17.  Started on Plavix  and aspirin .  Remains on broad-spectrum antibiotics.  Plan for transmetatarsal amputation on Wednesday.  Hemoglobin dropped without overt bleeding.  Anticoagulation discontinued.  GI consulted.  Plan for EGD on 4/22.  Subjective: Seen and examined earlier this morning.  No major events overnight of this morning.  Intermittent left foot pain but improved.  No signs of overt bleeding.  Slightly dark greenish stool in bedside commode.  No hematochezia.  GI at bedside.  Objective: Vitals:   10/28/23 2358 10/29/23 0341 10/29/23 0802  10/29/23 0953  BP: (!) 116/59 (!) 118/51  (!) 118/51  Pulse: 74 68  68  Resp: 18 18    Temp: 98.4 F (36.9 C) 98.7 F (37.1 C)    TempSrc: Oral Oral    SpO2: 99% 98% 94%   Weight:      Height:        Examination:  GENERAL: No apparent distress.  Nontoxic. HEENT: MMM.  Vision and hearing grossly intact.  NECK: Supple.  No apparent JVD.  RESP:  No IWOB.  Fair aeration bilaterally. CVS:  RRR. Heart sounds normal.  ABD/GI/GU: BS+. Abd soft, NTND.  MSK/EXT:  Moves extremities.  Dry gangrene in left 4th and 5th toe.  Faint DP pulses.  Significant muscle mass and subcu fat loss.  Erythema over the dorsum of left foot.  Dry skin in BLE.  See picture in media for more. SKIN: As above. NEURO: Awake, alert and oriented appropriately.  No apparent focal neuro deficit. PSYCH: Calm. Normal affect.   Consultants:  Orthopedic surgery Vascular surgery Gastroenterology  Procedures: 4/17-LLE angiogram with shockwave lithotripsy, drug-coated balloon angioplasty and stent to left SFA and above knee left popliteal artery.  See op note for details.  Microbiology summarized: MRSA PCR screen nonreactive Blood cultures NGTD  Assessment and plan: Left lower extremity critical limb ischemia with 4th and 5th dry gangrene: Present on arrival Severe bilateral PAD-as noted on CT angio Left foot osteomyelitis: Present on arrival -Left foot x-ray and CT angio as above. -S/p LLE angiogram with shockwave lithotripsy, drug-coated balloon angioplasty and stent to left SFA and above knee left popliteal artery by Dr. Fulton Job on 4/17.  -Continue aspirin  and Plavix  -  Vancomycin >> Zyvox  -Continue IV Zosyn  -Continue home Crestor .  LDL only 48. -Emphasized the importance of smoking cessation -Plan for transmetatarsal amputation on Wednesday   Chronic atrial fibrillation/flutter: Rate controlled.  On warfarin at home.  INR subtherapeutic after reversal for surgery -Hold heparin  given drop in hemoglobin -Continue  holding warfarin in anticipation for surgical intervention -May consider DOAC's if cost is not a barrier.  I do not see a contraindication -Consider home metoprolol   AKI: Baseline Cr~1.0. Could be due to IV contrast, IV antibiotics and lisinopril .  Resolved. Recent Labs    10/21/23 1327 10/22/23 0346 10/23/23 0402 10/24/23 0350 10/25/23 0424 10/25/23 1951 10/26/23 0418 10/27/23 0819 10/28/23 0535 10/29/23 0544  BUN 9 8 7* 5* 11 13 15 13 13 8   CREATININE 1.04 1.19 1.27* 1.36* 1.18 1.40* 1.49* 1.29* 1.22 1.10  - Vancomycin  changed to Zyvox . - Continue holding lisinopril  - Avoid nephrotoxic meds - Continue monitoring  ABLA superimposed on anemia of chronic disease: Hemoglobin dropped about 4 g after restarting anticoagulation with heparin .  No overt bleeding but he noted some dark stool on 4/19.  Anemia panel with vitamin B12 deficiency.  Hgb improved after 1 unit. Recent Labs    10/22/23 0346 10/23/23 0402 10/24/23 0350 10/25/23 0424 10/25/23 1951 10/26/23 0418 10/27/23 0819 10/28/23 0535 10/28/23 1321 10/29/23 0544  HGB 11.9* 11.4* 11.0* 11.7* 11.6* 10.2* 8.7* 7.4* 7.7* 8.5*  -Continue holding heparin . -Continue DAPT given recent stent unless profuse bleeding -Continue IV Protonix  40 mg twice daily -Vitamin B12 injection 1000 mcg daily for 2 days followed by p.o. -Monitor H&H.  Transfuse for Hgb <8.0 given history of CAD -Appreciate input by GI.  Plan for EGD 4/22  History of CAD: No cardiopulmonary symptoms. -Continue home metoprolol  and Crestor  -Now on aspirin  and Plavix  for PAD  Psoriasis: Did not follow-up with his dermatologist for a while -Continue clobetasol  cream twice daily   Essential hypertension: Normotensive for most part -Continue home amlodipine  and metoprolol  -Continue holding lisinopril   Chronic COPD: Stable -Continue home inhalers or hospital formulary -Encouraged smoking cessation  Alcohol abuse: No withdrawal symptoms.  CIWA 0. -  Multivitamin, thiamine  and folic acid   Hypokalemia - Replenish and recheck as appropriate  Leukocytosis: Improved. -Continue monitoring -Antibiotics as above  Body mass index is 25.12 kg/m.           DVT prophylaxis:  On full dose anticoagulation  Code Status: Full code Family Communication: None at bedside Level of care: Med-Surg Status is: Inpatient Remains inpatient appropriate because: Critical limb ischemia, osteomyelitis and acute blood loss anemia   Final disposition: Likely home   55 minutes with more than 50% spent in reviewing records, counseling patient/family and coordinating care.   Sch Meds:  Scheduled Meds:  sodium chloride    Intravenous Once   amLODipine   5 mg Oral Daily   aspirin   81 mg Oral Daily   budeson-glycopyrrolate -formoterol   2 puff Inhalation BID   clobetasol  cream   Topical BID   clopidogrel   75 mg Oral Daily   [START ON 10/30/2023] vitamin B-12  1,000 mcg Oral Daily   folic acid   1 mg Oral Daily   linezolid   600 mg Oral Q12H   metoprolol  succinate  100 mg Oral Daily   multivitamin with minerals  1 tablet Oral Daily   pantoprazole  (PROTONIX ) IV  40 mg Intravenous Q12H   rosuvastatin   20 mg Oral Daily   sodium chloride  flush  3 mL Intravenous Q12H   sodium chloride  flush  3  mL Intravenous Q12H   thiamine   100 mg Oral Daily   Continuous Infusions:  piperacillin -tazobactam 3.375 g (10/29/23 0849)   PRN Meds:.acetaminophen  **OR** acetaminophen , acetaminophen , hydrALAZINE , HYDROcodone -acetaminophen , labetalol , morphine  injection, polyethylene glycol, sodium chloride  flush  Antimicrobials: Anti-infectives (From admission, onward)    Start     Dose/Rate Route Frequency Ordered Stop   10/22/23 1045  linezolid  (ZYVOX ) tablet 600 mg        600 mg Oral Every 12 hours 10/22/23 0954     10/22/23 0600  vancomycin  (VANCOCIN ) IVPB 1000 mg/200 mL premix  Status:  Discontinued        1,000 mg 200 mL/hr over 60 Minutes Intravenous Every 12 hours  10/21/23 1738 10/21/23 1743   10/21/23 2200  piperacillin -tazobactam (ZOSYN ) IVPB 3.375 g       Placed in "Followed by" Linked Group   3.375 g 12.5 mL/hr over 240 Minutes Intravenous Every 8 hours 10/21/23 1504     10/21/23 1745  linezolid  (ZYVOX ) IVPB 600 mg  Status:  Discontinued        600 mg 300 mL/hr over 60 Minutes Intravenous Every 12 hours 10/21/23 1744 10/22/23 0954   10/21/23 1530  clindamycin  (CLEOCIN ) IVPB 600 mg  Status:  Discontinued        600 mg 100 mL/hr over 30 Minutes Intravenous  Once 10/21/23 1523 10/21/23 1715   10/21/23 1515  vancomycin  (VANCOREADY) IVPB 1750 mg/350 mL  Status:  Discontinued        1,750 mg 175 mL/hr over 120 Minutes Intravenous  Once 10/21/23 1504 10/21/23 1743   10/21/23 1515  piperacillin -tazobactam (ZOSYN ) IVPB 3.375 g       Placed in "Followed by" Linked Group   3.375 g 100 mL/hr over 30 Minutes Intravenous  Once 10/21/23 1504 10/21/23 1804        I have personally reviewed the following labs and images: CBC: Recent Labs  Lab 10/23/23 0402 10/24/23 0350 10/25/23 0424 10/25/23 1951 10/26/23 0418 10/27/23 0819 10/28/23 0535 10/28/23 1321 10/29/23 0544  WBC 12.1* 10.5 11.4*  --  14.7* 12.9* 11.3* 12.1* 11.3*  NEUTROABS 9.2* 7.9* 8.7*  --  12.2* 10.3*  --   --   --   HGB 11.4* 11.0* 11.7*   < > 10.2* 8.7* 7.4* 7.7* 8.5*  HCT 35.1* 34.7* 36.3*   < > 32.0* 27.5* 23.0* 24.3* 26.9*  MCV 96.2 97.2 95.8  --  97.9 98.2 98.7 98.8 95.1  PLT 372 345 372  --  367 311 260 274 267   < > = values in this interval not displayed.   BMP &GFR Recent Labs  Lab 10/25/23 0424 10/25/23 1951 10/26/23 0418 10/27/23 0819 10/28/23 0535 10/29/23 0544  NA 138 139 138 138 135 138  K 3.9 4.1 4.0 3.6 3.4* 4.4  CL 103 108 103 104 104 105  CO2 20*  --  20* 24 21* 23  GLUCOSE 87 110* 101* 97 148* 85  BUN 11 13 15 13 13 8   CREATININE 1.18 1.40* 1.49* 1.29* 1.22 1.10  CALCIUM  8.7*  --  8.5* 8.6* 8.2* 8.7*  MG 2.0  --  2.1 2.1 2.1 2.0  PHOS  --   --    --  2.8 3.0 3.2   Estimated Creatinine Clearance: 64.5 mL/min (by C-G formula based on SCr of 1.1 mg/dL). Liver & Pancreas: Recent Labs  Lab 10/27/23 0819 10/28/23 0535 10/29/23 0544  ALBUMIN 2.2* 1.9* 2.1*   No results for input(s): "LIPASE", "AMYLASE" in the  last 168 hours. No results for input(s): "AMMONIA" in the last 168 hours. Diabetic: No results for input(s): "HGBA1C" in the last 72 hours. No results for input(s): "GLUCAP" in the last 168 hours. Cardiac Enzymes: No results for input(s): "CKTOTAL", "CKMB", "CKMBINDEX", "TROPONINI" in the last 168 hours. No results for input(s): "PROBNP" in the last 8760 hours. Coagulation Profile: Recent Labs  Lab 10/25/23 0424 10/26/23 0418 10/27/23 0819 10/28/23 0535 10/29/23 0544  INR 1.4* 1.4* 1.3* 1.2 1.1   Thyroid  Function Tests: No results for input(s): "TSH", "T4TOTAL", "FREET4", "T3FREE", "THYROIDAB" in the last 72 hours. Lipid Profile: No results for input(s): "CHOL", "HDL", "LDLCALC", "TRIG", "CHOLHDL", "LDLDIRECT" in the last 72 hours.  Anemia Panel: Recent Labs    10/28/23 0535  VITAMINB12 175*  FOLATE 12.8  FERRITIN 403*  TIBC 211*  IRON 66  RETICCTPCT 1.5   Urine analysis:    Component Value Date/Time   COLORURINE YELLOW 08/13/2014 1330   APPEARANCEUR CLEAR 08/13/2014 1330   LABSPEC 1.010 08/13/2014 1330   PHURINE 7.5 08/13/2014 1330   GLUCOSEU NEGATIVE 08/13/2014 1330   HGBUR NEGATIVE 08/13/2014 1330   BILIRUBINUR NEGATIVE 08/13/2014 1330   KETONESUR NEGATIVE 08/13/2014 1330   PROTEINUR NEGATIVE 08/13/2014 1330   UROBILINOGEN 0.2 08/13/2014 1330   NITRITE NEGATIVE 08/13/2014 1330   LEUKOCYTESUR NEGATIVE 08/13/2014 1330   Sepsis Labs: Invalid input(s): "PROCALCITONIN", "LACTICIDVEN"  Microbiology: Recent Results (from the past 240 hours)  Blood culture (routine x 2)     Status: None   Collection Time: 10/21/23  1:30 PM   Specimen: BLOOD LEFT ARM  Result Value Ref Range Status   Specimen  Description BLOOD LEFT ARM  Final   Special Requests   Final    BOTTLES DRAWN AEROBIC AND ANAEROBIC Blood Culture results may not be optimal due to an inadequate volume of blood received in culture bottles   Culture   Final    NO GROWTH 5 DAYS Performed at [facility], [address] [place], [place] [address]    Report Status 10/26/2023 FINAL  Final  Blood culture (routine x 2)     Status: None   Collection Time: 10/21/23  2:24 PM   Specimen: BLOOD  Result Value Ref Range Status   Specimen Description BLOOD RIGHT ANTECUBITAL  Final   Special Requests   Final    BOTTLES DRAWN AEROBIC AND ANAEROBIC Blood Culture adequate volume   Culture   Final    NO GROWTH 5 DAYS Performed at [facility], [address] [place], [place] [address]    Report Status 10/26/2023 FINAL  Final  MRSA Next Gen by PCR, Nasal     Status: None   Collection Time: 10/21/23  9:15 PM   Specimen: Nasal Mucosa; Nasal Swab  Result Value Ref Range Status   MRSA by PCR Next Gen NOT DETECTED NOT DETECTED Final    Comment: (NOTE) The GeneXpert MRSA Assay (FDA approved for NASAL specimens only), is one component of a comprehensive MRSA colonization surveillance program. It is not intended to diagnose MRSA infection nor to guide or monitor treatment for MRSA infections. Test performance is not FDA approved in patients less than 58 years old. Performed at [facility], [address] [place], [place] [address]     Radiology Studies: No results found.     Turkessa Ostrom T. Geni Skorupski [facility]  If 7PM-7AM, please contact night-coverage [email or web address] 10/29/2023, 12:35 PM

## 2023-10-29 NOTE — Progress Notes (Signed)
 Physical Therapy Treatment Patient Details Name: Jason Spencer MRN: [id number] DOB: [birth year] Today's Date: 10/29/2023   History of Present Illness 73 y.o. male admitted 4/13  presenting with worsening left 4th and 5th toe pain. Osteomyelitis, amputation planned for 10/24/23.   PMH: hypertension, hyperlipidemia, atrial fibrillation/flutter, CAD, COPD, QT prolongation, dCHF, alcohol use, anxiety, pleural effusion    PT Comments  Pt seen for PT tx with pt requiring MAX encouragement/education re: importance of OOB mobility & participation in therapy but pt stating he cannot 2/2 pain. Pt denies pain at rest, but notes pain in L foot with dependent position. Pt reports need to use restroom so completes bed mobility with mod I, bed<>BSC via stand pivot with supervision, cuing re: hand placement, sequencing. Pt continues to decline ambulation attempts or to sit in recliner. Pt reporting he's not sure what parts of his foot will be removed during amputation; PT educated him on what standard transmetatarsal amputation is like. Pt left in bed with MD in room.    If plan is discharge home, recommend the following: A little help with walking and/or transfers;A little help with bathing/dressing/bathroom;Assistance with cooking/housework;Assist for transportation;Help with stairs or ramp for entrance   Can travel by private vehicle     Yes  Equipment Recommendations  Rolling walker (2 wheels);BSC/3in1    Recommendations for Other Services       Precautions / Restrictions Precautions Precautions: Fall Restrictions Weight Bearing Restrictions Per Provider Order: Yes LLE Weight Bearing Per Provider Order: Weight bearing as tolerated     Mobility  Bed Mobility Overal bed mobility: Modified Independent Bed Mobility: Supine to Sit     Supine to sit: Modified independent (Device/Increase time), HOB elevated (exit L side of bed)          Transfers Overall transfer level: Needs  assistance Equipment used: None Transfers: Bed to chair/wheelchair/BSC   Stand pivot transfers: Supervision (bed<>BSC)         General transfer comment: Cuing for hand placement, turning.    Ambulation/Gait                   Stairs             Wheelchair Mobility     Tilt Bed    Modified Rankin (Stroke Patients Only)       Balance Overall balance assessment: Needs assistance Sitting-balance support: No upper extremity supported, Feet supported Sitting balance-Leahy Scale: Good     Standing balance support: During functional activity, Bilateral upper extremity supported Standing balance-Leahy Scale: Fair                              Hotel manager: No apparent difficulties  Cognition Arousal: Alert Behavior During Therapy: WFL for tasks assessed/performed   PT - Cognitive impairments: No apparent impairments                       PT - Cognition Comments: decreased understanding of need for participation in PT despite ongoing education Following commands: Intact      Cueing Cueing Techniques: Verbal cues  Exercises      General Comments General comments (skin integrity, edema, etc.): continent BM on BSC, performs peri hygiene without assistance.      Pertinent Vitals/Pain Pain Assessment Pain Assessment: Faces Faces Pain Scale: Hurts a little bit Pain Location: no pain at rest, c/o increased pain with LLE in dependent position Pain Descriptors /  Indicators: Discomfort Pain Intervention(s): Repositioned    Home Living                          Prior Function            PT Goals (current goals can now be found in the care plan section) Acute Rehab PT Goals Patient Stated Goal: to find out about surgery PT Goal Formulation: With patient Time For Goal Achievement: 11/05/23 Potential to Achieve Goals: Fair Progress towards PT goals: Progressing toward goals     Frequency    Min 2X/week      PT Plan      Co-evaluation              AM-PAC PT "6 Clicks" Mobility   Outcome Measure  Help needed turning from your back to your side while in a flat bed without using bedrails?: None Help needed moving from lying on your back to sitting on the side of a flat bed without using bedrails?: None Help needed moving to and from a bed to a chair (including a wheelchair)?: A Little Help needed standing up from a chair using your arms (e.g., wheelchair or bedside chair)?: A Little Help needed to walk in hospital room?: Total Help needed climbing 3-5 steps with a railing? : Total 6 Click Score: 16    End of Session   Activity Tolerance: Patient limited by pain (self limiting) Patient left: in bed;with call bell/phone within reach (MDs in room) Nurse Communication: Mobility status PT Visit Diagnosis: Muscle weakness (generalized) (M62.81);Unsteadiness on feet (R26.81);Pain Pain - Right/Left: Right Pain - part of body: Ankle and joints of foot     Time: 1610-9604 PT Time Calculation (min) (ACUTE ONLY): 18 min  Charges:    $Therapeutic Activity: 8-22 mins PT General Charges $$ ACUTE PT VISIT: 1 Visit                     Emaline Handsome, PT, DPT 10/29/23, 9:54 AM   Venetta Gill 10/29/2023, 9:52 AM

## 2023-10-29 NOTE — H&P (View-Only) (Signed)
 Consultation  Referring Provider: [facility]/ Mae Schlossman Primary Care Physician:  Yevette Hem, FNP Primary Gastroenterologist:  unassigned  Reason for Consultation:  dark stool, anemia, drop in hgb   Attending physician's note  I personally saw the patient and performed a substantive portion of this encounter (>50% time spent), including a complete performance of at least one of the key components (MDM, Hx and/or Exam), in conjunction with the APP.  I agree with the APP's note, impression, and recommendations with additional input as follows.     89 yr M with L foot gangrene and osteomyelitis with acute drop in Hgb Heme positive dark stool but no overt GI bleed doesn't account for degree of acute drop in Hgb He does have a significant bruise in the groin at the site of angio Will obtain CT abd & pelvis to exclude any hematoma or aneurysm EGD to exclude any high risk upper GI lesions, he will likely need dual antiplatelet and anticoagulation s/p angio with stent placement NPO after midnight Cont PPI Monitor Hgb and transfuse as needed  The patient was provided an opportunity to ask questions and all were answered. The patient agreed with the plan and demonstrated an understanding of the instructions.  Lorena Rolling , MD [phone]     HPI: Jason Spencer is a 73 y.o. male who was admitted 1 week ago with worsening pain in the toes of his left foot.  Subsequently diagnosed with critical left foot gangrene and osteomyelitis.  He had been on Coumadin , was started on vancomycin  and Zosyn . He underwent lower extremity angio on 10/25/2023 with lithotripsy, and drug-coated angioplasty to the left popliteal and angio with stent placement to the left SFA.  He has been on heparin , Plavix  and aspirin  since. Plan per orthopedics is for partial amputation this week on Wednesday.  Patient also has multiple other comorbidities including hypertension, atrial fibs flutter, coronary artery disease, COPD,  history of long QT, congestive heart failure and history of EtOH use. On 416 his hemoglobin was 11> 8.7 on 10/27/2023> 7.7 yesterday and 7.4 last pm Stool documented heme positive. INR 1.1 Iron studies reviewed and not consistent with iron deficiency He was transfused 1 unit of packed RBCs and hemoglobin up to 8.5 today. Patient has not had any prior GI evaluation, says he has not had any prior issues with GI bleeding.  He denies any problems with abdominal pain or discomfort, no constipation diarrhea, no nausea or vomiting, no heartburn or indigestion, no dysphagia. He just had a bowel movement this morning which is in the bedside commode, this is dark greenish-brown, no overt melena.   Past Medical History:  Diagnosis Date   Acute CHF (HCC)    Maximo Spar 08/13/2014   Atrial fibrillation with RVR (HCC) 08/13/2014   new onset/notes 08/13/2014   CAP (community acquired pneumonia) 07/2014   Cardiomyopathy (HCC)    Migraine 1970     Past Surgical History:  Procedure Laterality Date   CARDIOVERSION N/A 08/19/2014   Procedure: CARDIOVERSION;  Surgeon: Darlis Eisenmenger, MD;  Location: [facility] ENDOSCOPY;  Service: Cardiovascular;  Laterality: N/A;   ENDOVENOUS ABLATION SAPHENOUS VEIN W/ LASER Right 05/01/2018   endovenous laser ablation right greater saphenous vein by Stacia Dynes MD    LEFT AND RIGHT HEART CATHETERIZATION WITH CORONARY ANGIOGRAM N/A 08/17/2014   Procedure: LEFT AND RIGHT HEART CATHETERIZATION WITH CORONARY ANGIOGRAM;  Surgeon: Mickiel Albany, MD;  Location: [facility] CATH LAB;  Service: Cardiovascular;  Laterality: N/A;   LOWER  EXTREMITY ANGIOGRAPHY Left 10/25/2023   Procedure: Lower Extremity Angiography;  Surgeon: Young Hensen, MD;  Location: [facility] INVASIVE CV LAB;  Service: Cardiovascular;  Laterality: Left;   LOWER EXTREMITY INTERVENTION Left 10/25/2023   Procedure: LOWER EXTREMITY INTERVENTION;  Surgeon: Young Hensen, MD;  Location: [facility] INVASIVE CV LAB;  Service: Cardiovascular;   Laterality: Left;   NO PAST SURGERIES     PERCUTANEOUS CORONARY STENT INTERVENTION (PCI-S)  08/17/2014   Procedure: PERCUTANEOUS CORONARY STENT INTERVENTION (PCI-S);  Surgeon: Mickiel Albany, MD;  Location: [facility] CATH LAB;  Service: Cardiovascular;;   TEE WITHOUT CARDIOVERSION N/A 08/19/2014   Procedure: TRANSESOPHAGEAL ECHOCARDIOGRAM (TEE);  Surgeon: Darlis Eisenmenger, MD;  Location: [facility] ENDOSCOPY;  Service: Cardiovascular;  Laterality: N/A;    Prior to Admission medications   Medication Sig Start Date End Date Taking? Authorizing Provider  amLODipine  (NORVASC ) 5 MG tablet TAKE ONE TABLET ONCE DAILY 09/26/23  Yes Hawks, Christy A, FNP  Budeson-Glycopyrrol-Formoterol  (BREZTRI  AEROSPHERE) 160-9-4.8 MCG/ACT AERO Inhale 2 puffs into the lungs 2 (two) times daily. Patient taking differently: Inhale 1 puff into the lungs daily. 09/15/22  Yes Hawks, Kathaleen Pale A, FNP  lisinopril  (ZESTRIL ) 40 MG tablet TAKE ONE TABLET ONCE DAILY 09/26/23  Yes Hawks, Kathaleen Pale A, FNP  metoprolol  succinate (TOPROL -XL) 100 MG 24 hr tablet TAKE 1 TABLET WITH OR IMMEDIATELY FOLLOWING A MEAL 09/26/23  Yes Hawks, Christy A, FNP  VASELINE PURE ULTRA WHITE ointment Apply 1 application  topically as needed (for psoriasis- affected areas).   Yes [provider]  warfarin (COUMADIN ) 5 MG tablet TAKE ONE TABLET ON MONDAY, WEDNESDAY, AND FRIDAY. TAKE 1 AND 1/2 TABLETS ON ALL other DAYS Patient taking differently: Take 5-7.5 mg by mouth See admin instructions. Take 7.5 mg by mouth in the morning on Sun/Tues/Thurs/Sat and 5 mg on Mon/Wed/Fri 09/21/23  Yes Hawks, Christy A, FNP  lidocaine  4 % Place 2 patches onto the skin daily. Patient not taking: Reported on 10/21/2023 07/06/23   Chrystine Crate, FNP  rosuvastatin  (CRESTOR ) 5 MG tablet Take 1 tablet (5 mg total) by mouth daily. Patient not taking: Reported on 10/21/2023 09/15/22   Tommas Fragmin A, FNP  triamcinolone  ointment (KENALOG ) 0.5 % Apply 1 Application topically 2 (two) times  daily. Patient not taking: Reported on 10/21/2023 05/10/23   Yevette Hem, FNP    Current Facility-Administered Medications  Medication Dose Route Frequency Provider Last Rate Last Admin   0.9 %  sodium chloride  infusion (Manually program via Guardrails IV Fluids)   Intravenous Once Gonfa, Taye T, MD       acetaminophen  (TYLENOL ) tablet 650 mg  650 mg Oral Q6H PRN Young Hensen, MD       Or   acetaminophen  (TYLENOL ) suppository 650 mg  650 mg Rectal Q6H PRN Young Hensen, MD       acetaminophen  (TYLENOL ) tablet 650 mg  650 mg Oral Q4H PRN Young Hensen, MD       amLODipine  (NORVASC ) tablet 5 mg  5 mg Oral Daily Young Hensen, MD   5 mg at 10/28/23 1009   aspirin  chewable tablet 81 mg  81 mg Oral Daily Young Hensen, MD   81 mg at 10/28/23 1009   budeson-glycopyrrolate -formoterol  (BREZTRI ) 160-9-4.8 MCG/ACT inhaler 2 puff  2 puff Inhalation BID Young Hensen, MD   2 puff at 10/29/23 0801   clobetasol  cream (TEMOVATE ) 0.05 %   Topical BID Young Hensen, MD   Given at 10/28/23 1016  clopidogrel  (PLAVIX ) tablet 75 mg  75 mg Oral Daily Elgergawy, Dawood S, MD   75 mg at 10/28/23 1009   cyanocobalamin  (VITAMIN B12) injection 1,000 mcg  1,000 mcg Intramuscular Daily Gonfa, Taye T, MD   1,000 mcg at 10/28/23 1844   Followed by   Cecily Cohen ON 10/30/2023] cyanocobalamin  (VITAMIN B12) tablet 1,000 mcg  1,000 mcg Oral Daily Gonfa, Taye T, MD       folic acid  (FOLVITE ) tablet 1 mg  1 mg Oral Daily Gonfa, Taye T, MD   1 mg at 10/28/23 1009   hydrALAZINE  (APRESOLINE ) injection 5 mg  5 mg Intravenous Q20 Min PRN Young Hensen, MD       HYDROcodone -acetaminophen  (NORCO/VICODIN) 5-325 MG per tablet 1 tablet  1 tablet Oral Q4H PRN Gonfa, Taye T, MD   1 tablet at 10/29/23 0122   labetalol  (NORMODYNE ) injection 10 mg  10 mg Intravenous Q10 min PRN Young Hensen, MD       linezolid  (ZYVOX ) tablet 600 mg  600 mg Oral Q12H Young Hensen, MD   600  mg at 10/28/23 2118   metoprolol  succinate (TOPROL -XL) 24 hr tablet 100 mg  100 mg Oral Daily Young Hensen, MD   100 mg at 10/28/23 1009   morphine  (PF) 2 MG/ML injection 1 mg  1 mg Intravenous Q4H PRN Young Hensen, MD   1 mg at 10/28/23 1640   multivitamin with minerals tablet 1 tablet  1 tablet Oral Daily Gonfa, Taye T, MD   1 tablet at 10/28/23 1010   pantoprazole  (PROTONIX ) injection 40 mg  40 mg Intravenous Q12H Gonfa, Taye T, MD   40 mg at 10/28/23 1844   piperacillin -tazobactam (ZOSYN ) IVPB 3.375 g  3.375 g Intravenous Q8H Young Hensen, MD 12.5 mL/hr at 10/29/23 0002 3.375 g at 10/29/23 0002   polyethylene glycol (MIRALAX  / GLYCOLAX ) packet 17 g  17 g Oral Daily PRN Young Hensen, MD       rosuvastatin  (CRESTOR ) tablet 20 mg  20 mg Oral Daily Klus, Jesse L, RPH   20 mg at 10/28/23 1010   sodium chloride  flush (NS) 0.9 % injection 3 mL  3 mL Intravenous Q12H Young Hensen, MD   3 mL at 10/28/23 2200   sodium chloride  flush (NS) 0.9 % injection 3 mL  3 mL Intravenous Q12H Young Hensen, MD   3 mL at 10/28/23 2200   sodium chloride  flush (NS) 0.9 % injection 3 mL  3 mL Intravenous PRN Young Hensen, MD       thiamine  (VITAMIN B1) tablet 100 mg  100 mg Oral Daily Gonfa, Taye T, MD   100 mg at 10/28/23 1009    Allergies as of 10/21/2023 - Review Complete 10/21/2023  Allergen Reaction Noted   Shellfish allergy Nausea And Vomiting and Other (See Comments) 05/01/2018    Family History  Problem Relation Age of Onset   Heart disease Mother        VALUE REPLACEMENT   COPD Father    Aneurysm Father     Social History   Socioeconomic History   Marital status: Single    Spouse name: Not on file   Number of children: Not on file   Years of education: Not on file   Highest education level: Not on file  Occupational History   Not on file  Tobacco Use   Smoking status: Every Day    Current packs/day: 0.50  Average packs/day: 0.5  packs/day for 44.0 years (22.0 ttl pk-yrs)    Types: Cigarettes   Smokeless tobacco: Never  Vaping Use   Vaping status: Never Used  Substance and Sexual Activity   Alcohol use: Yes    Alcohol/week: 0.0 standard drinks of alcohol    Comment: 08/13/2014 "I haven't drank in a month or so; if I drink it would be beer on a warm day"   Drug use: No   Sexual activity: Not Currently  Other Topics Concern   Not on file  Social History Narrative   Not on file   Social Drivers of Health   Financial Resource Strain: Low Risk  (01/08/2023)   Overall Financial Resource Strain (CARDIA)    Difficulty of Paying Living Expenses: Not hard at all  Food Insecurity: No Food Insecurity (10/21/2023)   Hunger Vital Sign    Worried About Running Out of Food in the Last Year: Never true    Ran Out of Food in the Last Year: Never true  Transportation Needs: No Transportation Needs (10/21/2023)   PRAPARE - Administrator, Civil Service (Medical): No    Lack of Transportation (Non-Medical): No  Physical Activity: Insufficiently Active (01/08/2023)   Exercise Vital Sign    Days of Exercise per Week: 2 days    Minutes of Exercise per Session: 20 min  Stress: Not on file  Social Connections: Moderately Integrated (10/22/2023)   Social Connection and Isolation Panel [NHANES]    Frequency of Communication with Friends and Family: Three times a week    Frequency of Social Gatherings with Friends and Family: Three times a week    Attends Religious Services: 1 to 4 times per year    Active Member of Clubs or Organizations: No    Attends Banker Meetings: 1 to 4 times per year    Marital Status: Never married  Intimate Partner Violence: Not At Risk (10/21/2023)   Humiliation, Afraid, Rape, and Kick questionnaire    Fear of Current or Ex-Partner: No    Emotionally Abused: No    Physically Abused: No    Sexually Abused: No    Review of Systems: Pertinent positive and negative review of  systems were noted in the above HPI section.  All other review of systems was otherwise negative.   Physical Exam: Vital signs in last 24 hours: Temp:  [97.5 F (36.4 C)-98.7 F (37.1 C)] 98.7 F (37.1 C) (04/21 0341) Pulse Rate:  [56-97] 68 (04/21 0341) Resp:  [17-20] 18 (04/21 0341) BP: (116-128)/(51-71) 118/51 (04/21 0341) SpO2:  [94 %-100 %] 94 % (04/21 0802) Last BM Date : 10/25/23 General:   Alert,  Well-developed, well-nourished, older white male pleasant and cooperative in NAD, sitting on bed Head:  Normocephalic and atraumatic. Eyes:  Sclera clear, no icterus.   Conjunctiva pink. Ears:  Normal auditory acuity. Nose:  No deformity, discharge,  or lesions. Mouth:  No deformity or lesions.   Neck:  Supple; no masses or thyromegaly. Lungs:  Clear throughout to auscultation.   No wheezes, crackles, or rhonchi.  Heart:  irRegular rate and rhythm; no murmurs, clicks, rubs,  or gallops. Abdomen:  Soft,nontender, BS active,nonpalp mass or hsm.   Rectal: Not done, with no stool in the bedside commode dark green-brown no overt melena Msk:  Symmetrical without gross deformities. .  Extremities:  Without clubbing or edema. Neurologic:  Alert and  oriented x4;  grossly normal neurologically. Skin:  Intact without significant lesions  or rashes.. Psych:  Alert and cooperative. Normal mood and affect.  Intake/Output from previous day: 04/20 0701 - 04/21 0700 In: 458.3 [I.V.:100; Blood:358.3] Out: 200 [Urine:200] Intake/Output this shift: No intake/output data recorded.  Lab Results: Recent Labs    10/28/23 0535 10/28/23 1321 10/29/23 0544  WBC 11.3* 12.1* 11.3*  HGB 7.4* 7.7* 8.5*  HCT 23.0* 24.3* 26.9*  PLT 260 274 267   BMET Recent Labs    10/27/23 0819 10/28/23 0535 10/29/23 0544  NA 138 135 138  K 3.6 3.4* 4.4  CL 104 104 105  CO2 24 21* 23  GLUCOSE 97 148* 85  BUN 13 13 8   CREATININE 1.29* 1.22 1.10  CALCIUM  8.6* 8.2* 8.7*   LFT Recent Labs     10/29/23 0544  ALBUMIN 2.1*   PT/INR Recent Labs    10/28/23 0535 10/29/23 0544  LABPROT 15.4* 14.9  INR 1.2 1.1   Hepatitis Panel No results for input(s): "HEPBSAG", "HCVAB", "HEPAIGM", "HEPBIGM" in the last 72 hours.   IMPRESSION:  #26 73 year old white male admitted with worsening pain in his left toes, and since found to have gangrene of the left forefoot, and osteomyelitis as well as limb ischemia.. He has been on IV antibiotics, underwent angioplasty of the left popliteal and angio with stent placement to the left SFA on 10/25/2023. Had been on heparin /Plavix /aspirin . Plan is for partial amputation later this week  #2 anemia normocytic, with drop in hemoglobin of over 3-4 g since admission. Stool documented heme positive, no overt melena. Transfused 1 unit yesterday with hemoglobin 8.5 today  As he is not having overt melena not sure that GI bleeding explains drop in hemoglobin of 3 g. Certainly he is at risk for gastropathy, stress ulceration, and has never had any GI evaluation so could bleed from a number of mucosal processes.  Also would consider intra-abdominal bleed in setting of heparin /Plavix /aspirin   #3 history of atrial fibs flutter 4.  Coronary artery disease 5.  COPD 6.  Long QT 7.  Congestive heart failure-preserved EF 8.  History of EtOH use  PLAN: Okay for diet today, n.p.o. past midnight Continue IV PPI twice daily Patient tentatively scheduled for EGD with Dr. Dietra Fraction tomorrow 10/30/2023 in AM.  Procedure was discussed in detail with the patient including indications risks and benefits and he is agreeable to proceed.  Continue to trend hemoglobin and transfuse as indicated Consider CT of the abdomen and pelvis today, will discuss.    Amy EsterwoodPA-C  10/29/2023, 8:36 AM

## 2023-10-29 NOTE — Progress Notes (Signed)
 OT Cancellation Note  Patient Details Name: Jason Spencer MRN: [id number] DOB: July 05, 1951   Cancelled Treatment:    Reason Eval/Treat Not Completed: Pain limiting ability to participate;Fatigue/lethargy limiting ability to participate. OT will follow up next available time  [name] 10/29/2023, 3:15 PM

## 2023-10-29 NOTE — Plan of Care (Signed)
  Problem: Clinical Measurements: Goal: Will remain free from infection Outcome: Progressing   Problem: Clinical Measurements: Goal: Diagnostic test results will improve Outcome: Progressing   Problem: Clinical Measurements: Goal: Respiratory complications will improve Outcome: Progressing   Problem: Clinical Measurements: Goal: Cardiovascular complication will be avoided Outcome: Progressing   Problem: Activity: Goal: Risk for activity intolerance will decrease Outcome: Progressing   Problem: Nutrition: Goal: Adequate nutrition will be maintained Outcome: Progressing   Problem: Elimination: Goal: Will not experience complications related to bowel motility Outcome: Progressing   Problem: Elimination: Goal: Will not experience complications related to urinary retention Outcome: Progressing

## 2023-10-30 ENCOUNTER — Inpatient Hospital Stay (HOSPITAL_COMMUNITY)

## 2023-10-30 ENCOUNTER — Encounter (HOSPITAL_COMMUNITY)
Admission: EM | Disposition: A | Discharge status: Skilled Nursing Facility | Payer: Self-pay | Source: Home / Self Care | Attending: Student

## 2023-10-30 ENCOUNTER — Encounter (HOSPITAL_COMMUNITY): Payer: Self-pay | Admitting: Internal Medicine

## 2023-10-30 DIAGNOSIS — K209 Esophagitis, unspecified without bleeding: Secondary | ICD-10-CM

## 2023-10-30 DIAGNOSIS — I4892 Unspecified atrial flutter: Secondary | ICD-10-CM | POA: Diagnosis not present

## 2023-10-30 DIAGNOSIS — K2970 Gastritis, unspecified, without bleeding: Secondary | ICD-10-CM

## 2023-10-30 DIAGNOSIS — R195 Other fecal abnormalities: Secondary | ICD-10-CM

## 2023-10-30 DIAGNOSIS — B3781 Candidal esophagitis: Secondary | ICD-10-CM

## 2023-10-30 DIAGNOSIS — D62 Acute posthemorrhagic anemia: Secondary | ICD-10-CM

## 2023-10-30 DIAGNOSIS — K3189 Other diseases of stomach and duodenum: Secondary | ICD-10-CM

## 2023-10-30 DIAGNOSIS — F411 Generalized anxiety disorder: Secondary | ICD-10-CM | POA: Diagnosis not present

## 2023-10-30 DIAGNOSIS — I2510 Atherosclerotic heart disease of native coronary artery without angina pectoris: Secondary | ICD-10-CM | POA: Diagnosis not present

## 2023-10-30 DIAGNOSIS — M869 Osteomyelitis, unspecified: Secondary | ICD-10-CM | POA: Diagnosis not present

## 2023-10-30 DIAGNOSIS — I509 Heart failure, unspecified: Secondary | ICD-10-CM | POA: Diagnosis not present

## 2023-10-30 DIAGNOSIS — I739 Peripheral vascular disease, unspecified: Secondary | ICD-10-CM | POA: Diagnosis not present

## 2023-10-30 DIAGNOSIS — I110 Hypertensive heart disease with heart failure: Secondary | ICD-10-CM | POA: Diagnosis not present

## 2023-10-30 HISTORY — PX: ESOPHAGOGASTRODUODENOSCOPY: SHX5428

## 2023-10-30 HISTORY — PX: BIOPSY OF SKIN SUBCUTANEOUS TISSUE AND/OR MUCOUS MEMBRANE: SHX6741

## 2023-10-30 LAB — RENAL FUNCTION PANEL
Albumin: 2.1 g/dL — ABNORMAL LOW (ref 3.5–5.0)
Anion gap: 8 (ref 5–15)
BUN: 10 mg/dL (ref 8–23)
CO2: 22 mmol/L (ref 22–32)
Calcium: 8.6 mg/dL — ABNORMAL LOW (ref 8.9–10.3)
Chloride: 106 mmol/L (ref 98–111)
Creatinine, Ser: 1.08 mg/dL (ref 0.61–1.24)
GFR, Estimated: >60 mL/min (ref >60)
Glucose, Bld: 82 mg/dL (ref 70–99)
Phosphorus: 3.3 mg/dL (ref 2.5–4.6)
Potassium: 4 mmol/L (ref 3.5–5.1)
Sodium: 136 mmol/L (ref 135–145)

## 2023-10-30 LAB — CBC
HCT: 26.6 % — ABNORMAL LOW (ref 39.0–52.0)
Hemoglobin: 8.7 g/dL — ABNORMAL LOW (ref 13.0–17.0)
MCH: 30.9 pg (ref 26.0–34.0)
MCHC: 32.7 g/dL (ref 30.0–36.0)
MCV: 94.3 fL (ref 80.0–100.0)
Platelets: 252 10*3/uL (ref 150–400)
RBC: 2.82 MIL/uL — ABNORMAL LOW (ref 4.22–5.81)
RDW: 14.4 % (ref 11.5–15.5)
WBC: 10.1 10*3/uL (ref 4.0–10.5)
nRBC: 0 % (ref 0.0–0.2)

## 2023-10-30 LAB — MAGNESIUM: Magnesium: 2 mg/dL (ref 1.7–2.4)

## 2023-10-30 LAB — PROTIME-INR
INR: 1.2 (ref 0.8–1.2)
Prothrombin Time: 15.2 s (ref 11.4–15.2)

## 2023-10-30 SURGERY — EGD (ESOPHAGOGASTRODUODENOSCOPY)
Anesthesia: Monitor Anesthesia Care

## 2023-10-30 MED ORDER — LIDOCAINE 2% (20 MG/ML) 5 ML SYRINGE
INTRAMUSCULAR | Status: DC | PRN
  Administered 2023-10-30: 100 mg via INTRAVENOUS

## 2023-10-30 MED ORDER — SODIUM CHLORIDE 0.9 % IV SOLN: INTRAVENOUS | Status: DC | PRN

## 2023-10-30 MED ORDER — FLUCONAZOLE 100 MG PO TABS
100.0000 mg | ORAL_TABLET | Freq: Every day | ORAL | Status: DC
  Administered 2023-10-30 – 2023-11-06 (×8): 100 mg via ORAL
  Filled 2023-10-30 (×9): qty 1

## 2023-10-30 MED ORDER — PROPOFOL 10 MG/ML IV BOLUS
INTRAVENOUS | Status: DC | PRN
  Administered 2023-10-30: 20 mg via INTRAVENOUS
  Administered 2023-10-30: 70 mg via INTRAVENOUS
  Administered 2023-10-30: 30 mg via INTRAVENOUS

## 2023-10-30 MED ORDER — PANTOPRAZOLE SODIUM 40 MG PO TBEC
40.0000 mg | DELAYED_RELEASE_TABLET | Freq: Two times a day (BID) | ORAL | Status: DC
  Administered 2023-10-30 – 2023-11-06 (×14): 40 mg via ORAL
  Filled 2023-10-30 (×14): qty 1

## 2023-10-30 NOTE — Anesthesia Preprocedure Evaluation (Signed)
 Anesthesia Evaluation  Patient identified by MRN, date of birth, ID band Patient awake    Reviewed: Allergy & Precautions, NPO status , Patient's Chart, lab work & pertinent test results  Airway Mallampati: II  TM Distance: >3 FB Neck ROM: Full    Dental   Pulmonary COPD, Current Smoker   breath sounds clear to auscultation       Cardiovascular hypertension, Pt. on medications + CAD, + Peripheral Vascular Disease and +CHF   Rhythm:Regular Rate:Normal     Neuro/Psych  Headaches    GI/Hepatic negative GI ROS, Neg liver ROS,,,  Endo/Other  negative endocrine ROS    Renal/GU negative Renal ROS     Musculoskeletal   Abdominal   Peds  Hematology  (+) Blood dyscrasia, anemia   Anesthesia Other Findings   Reproductive/Obstetrics                             Anesthesia Physical Anesthesia Plan  ASA: 3  Anesthesia Plan: MAC   Post-op Pain Management:    Induction:   PONV Risk Score and Plan: 0 and Propofol  infusion  Airway Management Planned: Natural Airway and Nasal Cannula  Additional Equipment:   Intra-op Plan:   Post-operative Plan: Extubation in OR  Informed Consent: I have reviewed the patients History and Physical, chart, labs and discussed the procedure including the risks, benefits and alternatives for the proposed anesthesia with the patient or authorized representative who has indicated his/her understanding and acceptance.     Dental advisory given  Plan Discussed with: CRNA  Anesthesia Plan Comments:        Anesthesia Quick Evaluation

## 2023-10-30 NOTE — Op Note (Signed)
 [facility] Patient Name: Jason Spencer Procedure Date : 10/30/2023 MRN: [id number] Attending MD: Sergio Dandy , MD, [id number] Date of Birth: 07/20/1950 CSN: [id number] Age: 73 Admit Type: Inpatient Procedure:                Upper GI endoscopy Indications:              Suspected upper gastrointestinal bleeding Providers:                Sergio Dandy, MD, Millicent Ally, RN, Gabino Joe, Technician Referring MD:              Medicines:                Monitored Anesthesia Care Complications:            No immediate complications. Estimated Blood Loss:     Estimated blood loss was minimal. Procedure:                Pre-Anesthesia Assessment:                           - Prior to the procedure, a History and Physical                            was performed, and patient medications and                            allergies were reviewed. The patient's tolerance of                            previous anesthesia was also reviewed. The risks                            and benefits of the procedure and the sedation                            options and risks were discussed with the patient.                            All questions were answered, and informed consent                            was obtained. Prior Anticoagulants: The patient                            last took Coumadin  (warfarin) 5 days, heparin  2                            days and Plavix  (clopidogrel ) 1 day prior to the                            procedure. ASA Grade Assessment: IV - A patient  with severe systemic disease that is a constant                            threat to life. After reviewing the risks and                            benefits, the patient was deemed in satisfactory                            condition to undergo the procedure.                           After obtaining informed consent, the endoscope was                             passed under direct vision. Throughout the                            procedure, the patient's blood pressure, pulse, and                            oxygen saturations were monitored continuously. The                            [id number]-[id number] ([id number]) Olympus endoscope was introduced                            through the mouth, and advanced to the second part                            of duodenum. The upper GI endoscopy was                            accomplished without difficulty. The patient                            tolerated the procedure well. Scope In: Scope Out: Findings:      Patchy, white plaques were found in the entire esophagus. Biopsies were       taken with a cold forceps for histology.      LA Grade B (one or more mucosal breaks greater than 5 mm, not extending       between the tops of two mucosal folds) esophagitis was found 37 to 38 cm       from the incisors.      Patchy mild inflammation characterized by congestion (edema), erythema       and friability was found in the entire examined stomach. Biopsies were       taken with a cold forceps for Helicobacter pylori testing.      The cardia and gastric fundus were normal on retroflexion.      The examined duodenum was normal. Impression:               - Esophageal plaques were found, suspicious for  candidiasis. Biopsied.                           - LA Grade B reflux esophagitis.                           - Gastritis. Biopsied.                           - Normal examined duodenum. Recommendation:           - Patient has a contact number available for                            emergencies. The signs and symptoms of potential                            delayed complications were discussed with the                            patient. Return to normal activities tomorrow.                            Written discharge instructions were provided to the                             patient.                           - Resume previous diet.                           - Continue present medications.                           - Await pathology results.                           - No high dose aspirin , ibuprofen, naproxen, or                            other non-steroidal anti-inflammatory drugs.                           - Continue antiplatelet and anticoagulation                           - Pantoprazole  40mg  PO BID                           - Diflucan  100mg  daily X 2 weeks                           - Likely source of acute blood loss is site of                            angio cath in the R groin, hematoma                           -  GI signing off. Procedure Code(s):        --- Professional ---                           [phone], Esophagogastroduodenoscopy, flexible,                            transoral; with biopsy, single or multiple Diagnosis Code(s):        --- Professional ---                           K22.9, Disease of esophagus, unspecified                           K21.00, Gastro-esophageal reflux disease with                            esophagitis, without bleeding                           K29.70, Gastritis, unspecified, without bleeding CPT copyright 2022 American [employer] Association. All rights reserved. The codes documented in this report are preliminary and upon coder review may  be revised to meet current compliance requirements. Jason Rumery V. Doyt Castellana, MD 10/30/2023 9:49:54 AM This report has been signed electronically. Number of Addenda: 0

## 2023-10-30 NOTE — Progress Notes (Signed)
 PROGRESS NOTE  Seville Downs WUJ:[id number] DOB: 07/23/1950   PCP: Yevette Hem, FNP  Patient is from: Home  DOA: 10/21/2023 LOS: 9  Chief complaints Chief Complaint  Patient presents with   Circulatory Problem     Brief Narrative / Interim history: 73 year old M with PMH of COPD, CAD, atrial fibrillation/flutter on warfarin, HTN, HLD, EtOH use, anxiety, pleural effusion, psoriasis, PAD and tobacco use disorder presenting with progressive left 4th and 5th toe pain and discoloration for about 2 weeks, and admitted with critical limb ischemia with left 4th and 5th toe gangrene, and fifth metatarsal head osteomyelitis.   He had mild leukocytosis with elevated CRP to 9.9.  Has no fever.  Pro-Cal negative.  Foot x-ray showed subcutaneous gas about the head of the fifth metatarsal and proximal phalanx of left fifth toe with cortical loss medially from the fifth metatarsal head consistent with osteomyelitis.  Blood cultures obtained.  Patient was started on broad-spectrum antibiotics.  Orthopedic surgery and vascular surgery consulted.   CT angio showed severe bilateral multifocal high-grade stenosis from femoral arteries down, and aortic atherosclerosis.    INR reversed with vitamin K and he underwent LLE angiogram with shockwave lithotripsy, drug-coated balloon angioplasty and stent to left SFA and above knee left popliteal artery on 4/17.  Started on Plavix  and aspirin .  Remains on broad-spectrum antibiotics.  Plan for transmetatarsal amputation on Wednesday.  Hgb dropped about 4 g dropped without overt bleeding.  Anticoagulation discontinued.  GI consulted.  EGD showed esophageal plaque concerning for candidiasis (biopsied), large grade B reflux esophagitis, gastritis (biopsied) and normal duodenum.  GI recommended p.o. Protonix  40 mg twice daily, continuing antiplatelet and anticoagulation and Diflucan .  CT abdomen and pelvis pending.   Subjective: Seen and examined earlier this  morning after he returned EGD.  No major events overnight of this morning.  No complaints.  Objective: Vitals:   10/30/23 0940 10/30/23 0950 10/30/23 1018 10/30/23 1040  BP: 132/80 128/73  138/71  Pulse: 90 83    Resp: (!) 22 16    Temp:   (!) 97.5 F (36.4 C)   TempSrc:   Oral   SpO2: 96% 95%    Weight:      Height:        Examination:  GENERAL: No apparent distress.  Nontoxic. HEENT: MMM.  Vision and hearing grossly intact.  NECK: Supple.  No apparent JVD.  RESP:  No IWOB.  Fair aeration bilaterally. CVS:  RRR. Heart sounds normal.  ABD/GI/GU: BS+. Abd soft, NTND.  MSK/EXT:  Moves extremities.  Dry gangrene in left 4th and 5th toe.  Faint DP pulses.  Significant muscle mass and subcu fat loss.  Erythema over the dorsum of left foot.  Dry skin in BLE.  See picture in media for more. SKIN: As above. NEURO: Awake, alert and oriented appropriately.  No apparent focal neuro deficit. PSYCH: Calm. Normal affect.   Consultants:  Orthopedic surgery Vascular surgery Gastroenterology  Procedures: 4/17-LLE angiogram with shockwave lithotripsy, drug-coated balloon angioplasty and stent to left SFA and above knee left popliteal artery.  See op note for details. 4/22-EGD showed esophageal plaque concerning for candidiasis (biopsied), large grade B reflux esophagitis, gastritis (biopsied) and normal duodenum.  Microbiology summarized: MRSA PCR screen nonreactive Blood cultures NGTD  Assessment and plan: Left lower extremity critical limb ischemia with 4th and 5th dry gangrene: Present on arrival Severe bilateral PAD-as noted on CT angio Left foot osteomyelitis: Present on arrival -Left foot x-ray and CT angio  as above. -S/p LLE angiogram with shockwave lithotripsy, drug-coated balloon angioplasty and stent to left SFA and above knee left popliteal artery by Dr. Fulton Job on 4/17.  -Continue aspirin  and Plavix  -Continue Zyvox  and Zosyn  -Continue home Crestor .  LDL only 48. -Emphasized  the importance of smoking cessation -Plan for transmetatarsal amputation on Wednesday   Chronic atrial fibrillation/flutter: Rate controlled.  On warfarin at home.  INR subtherapeutic after reversal for surgery.  -Hold heparin  today.  Will resume if H&H stable tomorrow, 4/23 -Continue holding warfarin in anticipation for surgical intervention.  Does not afford DOAC. -Consider home metoprolol   AKI: Baseline Cr~1.0. Could be due to IV contrast, IV antibiotics and lisinopril .  Resolved. Recent Labs    10/22/23 0346 10/23/23 0402 10/24/23 0350 10/25/23 0424 10/25/23 1951 10/26/23 0418 10/27/23 0819 10/28/23 0535 10/29/23 0544 10/30/23 0623  BUN 8 7* 5* 11 13 15 13 13 8 10   CREATININE 1.19 1.27* 1.36* 1.18 1.40* 1.49* 1.29* 1.22 1.10 1.08  - Continue holding lisinopril  - Avoid nephrotoxic meds - Continue monitoring  ABLA superimposed on anemia of chronic disease: Hemoglobin dropped about 4 g after restarting anticoagulation with heparin .  No overt bleeding but he noted some dark stool on 4/19.  Anemia panel with vitamin B12 deficiency.  Hgb improved understand after 1 unit.  EGD as above. Recent Labs    10/23/23 0402 10/24/23 0350 10/25/23 0424 10/25/23 1951 10/26/23 0418 10/27/23 0819 10/28/23 0535 10/28/23 1321 10/29/23 0544 10/30/23 0623  HGB 11.4* 11.0* 11.7* 11.6* 10.2* 8.7* 7.4* 7.7* 8.5* 8.7*  -Continue DAPT given recent stent unless profuse bleeding -Continue p.o. Protonix  40 mg twice daily per GI -Received vitamin B12 injection 1000 mcg for 2 days.  Continue p.o. -Monitor H&H.  Transfuse for Hgb <8.0 given history of CAD  Esophageal plaques/gastritis/esophagitis - GI recommended p.o. Protonix  40 mg twice daily - Follow biopsy from EGD  History of CAD: No cardiopulmonary symptoms. -Continue home metoprolol  and Crestor  -Now on aspirin  and Plavix  for PAD  Psoriasis: Did not follow-up with his dermatologist for a while -Continue clobetasol  cream twice daily    Essential hypertension: Normotensive for most part -Continue home amlodipine  and metoprolol  -Continue holding lisinopril   Chronic COPD: Stable -Continue home inhalers or hospital formulary -Encouraged smoking cessation  Alcohol abuse: No withdrawal symptoms.  CIWA 0. - Multivitamin, thiamine  and folic acid   Hypokalemia - Replenish and recheck as appropriate  Leukocytosis: Improved. -Continue monitoring -Antibiotics as above  Body mass index is 25.12 kg/m.           DVT prophylaxis:  On full dose anticoagulation  Code Status: Full code Family Communication: None at bedside Level of care: Med-Surg Status is: Inpatient Remains inpatient appropriate because: Critical limb ischemia, osteomyelitis and acute blood loss anemia   Final disposition: Likely home   55 minutes with more than 50% spent in reviewing records, counseling patient/family and coordinating care.   Sch Meds:  Scheduled Meds:  sodium chloride    Intravenous Once   amLODipine   5 mg Oral Daily   aspirin   81 mg Oral Daily   budeson-glycopyrrolate -formoterol   2 puff Inhalation BID   clobetasol  cream   Topical BID   clopidogrel   75 mg Oral Daily   vitamin B-12  1,000 mcg Oral Daily   folic acid   1 mg Oral Daily   linezolid   600 mg Oral Q12H   metoprolol  succinate  100 mg Oral Daily   multivitamin with minerals  1 tablet Oral Daily   pantoprazole  (PROTONIX ) IV  40 mg Intravenous Q12H   rosuvastatin   20 mg Oral Daily   sodium chloride  flush  3 mL Intravenous Q12H   sodium chloride  flush  3 mL Intravenous Q12H   thiamine   100 mg Oral Daily   Continuous Infusions:  piperacillin -tazobactam 3.375 g (10/30/23 0458)   PRN Meds:.acetaminophen  **OR** acetaminophen , acetaminophen , hydrALAZINE , HYDROcodone -acetaminophen , labetalol , morphine  injection, polyethylene glycol, sodium chloride  flush  Antimicrobials: Anti-infectives (From admission, onward)    Start     Dose/Rate Route Frequency Ordered Stop    10/22/23 1045  linezolid  (ZYVOX ) tablet 600 mg        600 mg Oral Every 12 hours 10/22/23 0954     10/22/23 0600  vancomycin  (VANCOCIN ) IVPB 1000 mg/200 mL premix  Status:  Discontinued        1,000 mg 200 mL/hr over 60 Minutes Intravenous Every 12 hours 10/21/23 1738 10/21/23 1743   10/21/23 2200  piperacillin -tazobactam (ZOSYN ) IVPB 3.375 g       Placed in "Followed by" Linked Group   3.375 g 12.5 mL/hr over 240 Minutes Intravenous Every 8 hours 10/21/23 1504     10/21/23 1745  linezolid  (ZYVOX ) IVPB 600 mg  Status:  Discontinued        600 mg 300 mL/hr over 60 Minutes Intravenous Every 12 hours 10/21/23 1744 10/22/23 0954   10/21/23 1530  clindamycin  (CLEOCIN ) IVPB 600 mg  Status:  Discontinued        600 mg 100 mL/hr over 30 Minutes Intravenous  Once 10/21/23 1523 10/21/23 1715   10/21/23 1515  vancomycin  (VANCOREADY) IVPB 1750 mg/350 mL  Status:  Discontinued        1,750 mg 175 mL/hr over 120 Minutes Intravenous  Once 10/21/23 1504 10/21/23 1743   10/21/23 1515  piperacillin -tazobactam (ZOSYN ) IVPB 3.375 g       Placed in "Followed by" Linked Group   3.375 g 100 mL/hr over 30 Minutes Intravenous  Once 10/21/23 1504 10/21/23 1804        I have personally reviewed the following labs and images: CBC: Recent Labs  Lab 10/24/23 0350 10/25/23 0424 10/25/23 1951 10/26/23 0418 10/27/23 0819 10/28/23 0535 10/28/23 1321 10/29/23 0544 10/30/23 0623  WBC 10.5 11.4*  --  14.7* 12.9* 11.3* 12.1* 11.3* 10.1  NEUTROABS 7.9* 8.7*  --  12.2* 10.3*  --   --   --   --   HGB 11.0* 11.7*   < > 10.2* 8.7* 7.4* 7.7* 8.5* 8.7*  HCT 34.7* 36.3*   < > 32.0* 27.5* 23.0* 24.3* 26.9* 26.6*  MCV 97.2 95.8  --  97.9 98.2 98.7 98.8 95.1 94.3  PLT 345 372  --  367 311 260 274 267 252   < > = values in this interval not displayed.   BMP &GFR Recent Labs  Lab 10/26/23 0418 10/27/23 0819 10/28/23 0535 10/29/23 0544 10/30/23 0623  NA 138 138 135 138 136  K 4.0 3.6 3.4* 4.4 4.0  CL 103  104 104 105 106  CO2 20* 24 21* 23 22  GLUCOSE 101* 97 148* 85 82  BUN 15 13 13 8 10   CREATININE 1.49* 1.29* 1.22 1.10 1.08  CALCIUM  8.5* 8.6* 8.2* 8.7* 8.6*  MG 2.1 2.1 2.1 2.0 2.0  PHOS  --  2.8 3.0 3.2 3.3   Estimated Creatinine Clearance: 65.7 mL/min (by C-G formula based on SCr of 1.08 mg/dL). Liver & Pancreas: Recent Labs  Lab 10/27/23 0819 10/28/23 0535 10/29/23 0544 10/30/23 0623  ALBUMIN 2.2* 1.9*  2.1* 2.1*   No results for input(s): "LIPASE", "AMYLASE" in the last 168 hours. No results for input(s): "AMMONIA" in the last 168 hours. Diabetic: No results for input(s): "HGBA1C" in the last 72 hours. No results for input(s): "GLUCAP" in the last 168 hours. Cardiac Enzymes: No results for input(s): "CKTOTAL", "CKMB", "CKMBINDEX", "TROPONINI" in the last 168 hours. No results for input(s): "PROBNP" in the last 8760 hours. Coagulation Profile: Recent Labs  Lab 10/26/23 0418 10/27/23 0819 10/28/23 0535 10/29/23 0544 10/30/23 0623  INR 1.4* 1.3* 1.2 1.1 1.2   Thyroid  Function Tests: No results for input(s): "TSH", "T4TOTAL", "FREET4", "T3FREE", "THYROIDAB" in the last 72 hours. Lipid Profile: No results for input(s): "CHOL", "HDL", "LDLCALC", "TRIG", "CHOLHDL", "LDLDIRECT" in the last 72 hours.  Anemia Panel: Recent Labs    10/28/23 0535  VITAMINB12 175*  FOLATE 12.8  FERRITIN 403*  TIBC 211*  IRON 66  RETICCTPCT 1.5   Urine analysis:    Component Value Date/Time   COLORURINE YELLOW 08/13/2014 1330   APPEARANCEUR CLEAR 08/13/2014 1330   LABSPEC 1.010 08/13/2014 1330   PHURINE 7.5 08/13/2014 1330   GLUCOSEU NEGATIVE 08/13/2014 1330   HGBUR NEGATIVE 08/13/2014 1330   BILIRUBINUR NEGATIVE 08/13/2014 1330   KETONESUR NEGATIVE 08/13/2014 1330   PROTEINUR NEGATIVE 08/13/2014 1330   UROBILINOGEN 0.2 08/13/2014 1330   NITRITE NEGATIVE 08/13/2014 1330   LEUKOCYTESUR NEGATIVE 08/13/2014 1330   Sepsis Labs: Invalid input(s): "PROCALCITONIN",  "LACTICIDVEN"  Microbiology: Recent Results (from the past 240 hours)  Blood culture (routine x 2)     Status: None   Collection Time: 10/21/23  1:30 PM   Specimen: BLOOD LEFT ARM  Result Value Ref Range Status   Specimen Description BLOOD LEFT ARM  Final   Special Requests   Final    BOTTLES DRAWN AEROBIC AND ANAEROBIC Blood Culture results may not be optimal due to an inadequate volume of blood received in culture bottles   Culture   Final    NO GROWTH 5 DAYS Performed at [facility], [address], [place], [place] [address]    Report Status 10/26/2023 FINAL  Final  Blood culture (routine x 2)     Status: None   Collection Time: 10/21/23  2:24 PM   Specimen: BLOOD  Result Value Ref Range Status   Specimen Description BLOOD RIGHT ANTECUBITAL  Final   Special Requests   Final    BOTTLES DRAWN AEROBIC AND ANAEROBIC Blood Culture adequate volume   Culture   Final    NO GROWTH 5 DAYS Performed at [facility], [address] [place], [place] [address]    Report Status 10/26/2023 FINAL  Final  MRSA Next Gen by PCR, Nasal     Status: None   Collection Time: 10/21/23  9:15 PM   Specimen: Nasal Mucosa; Nasal Swab  Result Value Ref Range Status   MRSA by PCR Next Gen NOT DETECTED NOT DETECTED Final    Comment: (NOTE) The GeneXpert MRSA Assay (FDA approved for NASAL specimens only), is one component of a comprehensive MRSA colonization surveillance program. It is not intended to diagnose MRSA infection nor to guide or monitor treatment for MRSA infections. Test performance is not FDA approved in patients less than 61 years old. Performed at [facility], [address] [place], [place] [address]     Radiology Studies: No results found.     Tayton Decaire T. Taija Mathias [facility]  If 7PM-7AM, please contact night-coverage [email or web address] 10/30/2023,  10:43 AM

## 2023-10-30 NOTE — Interval H&P Note (Signed)
 History and Physical Interval Note:  10/30/2023 9:02 AM  Jason Spencer  has presented today for surgery, with the diagnosis of melena, anemia.  The various methods of treatment have been discussed with the patient and family. After consideration of risks, benefits and other options for treatment, the patient has consented to  Procedure(s): EGD (ESOPHAGOGASTRODUODENOSCOPY) (N/A) as a surgical intervention.  The patient's history has been reviewed, patient examined, no change in status, stable for surgery.  I have reviewed the patient's chart and labs.  Questions were answered to the patient's satisfaction.     [name]

## 2023-10-30 NOTE — Transfer of Care (Signed)
 Immediate Anesthesia Transfer of Care Note  Patient: Jason Spencer  Procedure(s) Performed: EGD (ESOPHAGOGASTRODUODENOSCOPY)  Patient Location: PACU  Anesthesia Type:MAC  Level of Consciousness: awake, alert , and oriented  Airway & Oxygen Therapy: Patient Spontanous Breathing and Patient connected to nasal cannula oxygen  Post-op Assessment: Report given to RN and Post -op Vital signs reviewed and stable  Post vital signs: Reviewed and stable  Last Vitals:  Vitals Value Taken Time  BP    Temp    Pulse    Resp    SpO2      Last Pain:  Vitals:   10/30/23 0855  TempSrc: Temporal  PainSc: 0-No pain      Patients Stated Pain Goal: 3 (10/29/23 2034)  Complications: No notable events documented.

## 2023-10-30 NOTE — Plan of Care (Signed)
  Problem: Clinical Measurements: Goal: Ability to maintain clinical measurements within normal limits will improve Outcome: Progressing   Problem: Clinical Measurements: Goal: Cardiovascular complication will be avoided Outcome: Progressing   Problem: Activity: Goal: Risk for activity intolerance will decrease Outcome: Progressing   Problem: Nutrition: Goal: Adequate nutrition will be maintained Outcome: Progressing   Problem: Elimination: Goal: Will not experience complications related to bowel motility Outcome: Progressing   Problem: Pain Managment: Goal: General experience of comfort will improve and/or be controlled Outcome: Progressing

## 2023-10-30 NOTE — Progress Notes (Signed)
   10/30/23 1718  Mobility  Activity Dangled on edge of bed  Level of Assistance Standby assist, set-up cues, supervision of patient - no hands on  Assistive Device None  Range of Motion/Exercises Right leg  LLE Weight Bearing Per Provider Order WBAT  Activity Response Tolerated fair  Mobility Referral Yes  Mobility visit 1 Mobility  Mobility Specialist Start Time (ACUTE ONLY) 1718  Mobility Specialist Stop Time (ACUTE ONLY) 0525  Mobility Specialist Time Calculation (min) (ACUTE ONLY) 727 min   Mobility Specialist: Progress Note  Pt agreeable to mobility session - received in bed. C/o LLE pain 6.5/10 throughout - pt adamant about not moving LLE d/t pain, c/o " I feel the blood rushing.". Returned to bed with all needs met - call bell within reach. RLE Exercises: heel to toe taps (x10). Further mobility deferred by pt.   Jason Spencer, BS Mobility Specialist Please contact via SecureChat or  Rehab office at [phone].

## 2023-10-30 NOTE — TOC Progression Note (Addendum)
 Transition of Care [facility]) - Progression Note    Patient Details  Name: Jason Spencer MRN: [id number] Date of Birth: 07-Dec-1950  [facility]) CM/SW Contact  Carmon Christen, LCSWA Phone Number: 10/30/2023, 1:37 PM  Clinical Narrative:     CSW spoke [place] with [facility] she is going to follow up with DOB on status of patients referral for possible SNF bed offer. CSW following to start insurance authorization closer to patient being medically ready for dc. CSW will continue to follow.  Update- CSW heard back from [place] with [facility] who confirmed SNF bed for patient.  Expected Discharge Plan: Home/Self Care Barriers to Discharge: Continued Medical Work up  Expected Discharge Plan and Services       Living arrangements for the past 2 months: Single Family Home                                       Social Determinants of Health (SDOH) Interventions SDOH Screenings   Food Insecurity: No Food Insecurity (10/21/2023)  Housing: Low Risk  (10/21/2023)  Transportation Needs: No Transportation Needs (10/21/2023)  Utilities: Not At Risk (10/21/2023)  Depression (PHQ2-9): Low Risk  (07/06/2023)  Financial Resource Strain: Low Risk  (01/08/2023)  Physical Activity: Insufficiently Active (01/08/2023)  Social Connections: Moderately Integrated (10/22/2023)  Tobacco Use: High Risk (10/30/2023)    Readmission Risk Interventions     No data to display

## 2023-10-31 ENCOUNTER — Other Ambulatory Visit: Payer: Self-pay

## 2023-10-31 ENCOUNTER — Inpatient Hospital Stay (HOSPITAL_COMMUNITY): Admitting: Anesthesiology

## 2023-10-31 ENCOUNTER — Encounter (HOSPITAL_COMMUNITY): Payer: Self-pay | Admitting: Gastroenterology

## 2023-10-31 ENCOUNTER — Encounter (HOSPITAL_COMMUNITY)
Admission: EM | Disposition: A | Discharge status: Skilled Nursing Facility | Payer: Self-pay | Source: Home / Self Care | Attending: Student

## 2023-10-31 DIAGNOSIS — M869 Osteomyelitis, unspecified: Secondary | ICD-10-CM | POA: Diagnosis not present

## 2023-10-31 DIAGNOSIS — M86172 Other acute osteomyelitis, left ankle and foot: Secondary | ICD-10-CM | POA: Diagnosis not present

## 2023-10-31 DIAGNOSIS — I2510 Atherosclerotic heart disease of native coronary artery without angina pectoris: Secondary | ICD-10-CM

## 2023-10-31 DIAGNOSIS — I4892 Unspecified atrial flutter: Secondary | ICD-10-CM | POA: Diagnosis not present

## 2023-10-31 DIAGNOSIS — I739 Peripheral vascular disease, unspecified: Secondary | ICD-10-CM | POA: Diagnosis not present

## 2023-10-31 DIAGNOSIS — F411 Generalized anxiety disorder: Secondary | ICD-10-CM | POA: Diagnosis not present

## 2023-10-31 DIAGNOSIS — I96 Gangrene, not elsewhere classified: Secondary | ICD-10-CM | POA: Diagnosis not present

## 2023-10-31 DIAGNOSIS — J189 Pneumonia, unspecified organism: Secondary | ICD-10-CM

## 2023-10-31 DIAGNOSIS — I4891 Unspecified atrial fibrillation: Secondary | ICD-10-CM | POA: Diagnosis not present

## 2023-10-31 HISTORY — PX: TRANSMETATARSAL AMPUTATION: SHX6197

## 2023-10-31 LAB — MAGNESIUM: Magnesium: 2 mg/dL (ref 1.7–2.4)

## 2023-10-31 LAB — CBC
HCT: 27 % — ABNORMAL LOW (ref 39.0–52.0)
Hemoglobin: 8.6 g/dL — ABNORMAL LOW (ref 13.0–17.0)
MCH: 30.2 pg (ref 26.0–34.0)
MCHC: 31.9 g/dL (ref 30.0–36.0)
MCV: 94.7 fL (ref 80.0–100.0)
Platelets: 265 10*3/uL (ref 150–400)
RBC: 2.85 MIL/uL — ABNORMAL LOW (ref 4.22–5.81)
RDW: 14.1 % (ref 11.5–15.5)
WBC: 10.4 10*3/uL (ref 4.0–10.5)
nRBC: 0 % (ref 0.0–0.2)

## 2023-10-31 LAB — RENAL FUNCTION PANEL
Albumin: 2.2 g/dL — ABNORMAL LOW (ref 3.5–5.0)
Anion gap: 11 (ref 5–15)
BUN: 12 mg/dL (ref 8–23)
CO2: 22 mmol/L (ref 22–32)
Calcium: 8.5 mg/dL — ABNORMAL LOW (ref 8.9–10.3)
Chloride: 103 mmol/L (ref 98–111)
Creatinine, Ser: 1.18 mg/dL (ref 0.61–1.24)
GFR, Estimated: >60 mL/min (ref >60)
Glucose, Bld: 88 mg/dL (ref 70–99)
Phosphorus: 3.7 mg/dL (ref 2.5–4.6)
Potassium: 4 mmol/L (ref 3.5–5.1)
Sodium: 136 mmol/L (ref 135–145)

## 2023-10-31 LAB — PROTIME-INR
INR: 1.2 (ref 0.8–1.2)
Prothrombin Time: 14.9 s (ref 11.4–15.2)

## 2023-10-31 SURGERY — AMPUTATION, FOOT, TRANSMETATARSAL
Anesthesia: General | Site: Foot | Laterality: Left

## 2023-10-31 MED ORDER — PHENYLEPHRINE 80 MCG/ML (10ML) SYRINGE FOR IV PUSH (FOR BLOOD PRESSURE SUPPORT)
PREFILLED_SYRINGE | INTRAVENOUS | Status: DC | PRN
  Administered 2023-10-31 (×2): 80 ug via INTRAVENOUS

## 2023-10-31 MED ORDER — CELECOXIB 200 MG PO CAPS: 200.0000 mg | ORAL_CAPSULE | Freq: Once | ORAL | Status: DC

## 2023-10-31 MED ORDER — DEXAMETHASONE SODIUM PHOSPHATE 4 MG/ML IJ SOLN
INTRAMUSCULAR | Status: DC | PRN
  Administered 2023-10-31: 3 mg via PERINEURAL

## 2023-10-31 MED ORDER — PHENYLEPHRINE 80 MCG/ML (10ML) SYRINGE FOR IV PUSH (FOR BLOOD PRESSURE SUPPORT)
PREFILLED_SYRINGE | INTRAVENOUS | Status: AC
  Filled 2023-10-31: qty 10

## 2023-10-31 MED ORDER — ACETAMINOPHEN 500 MG PO TABS: 1000.0000 mg | ORAL_TABLET | Freq: Once | ORAL | Status: DC

## 2023-10-31 MED ORDER — OXYCODONE HCL 5 MG PO TABS: 5.0000 mg | ORAL_TABLET | Freq: Once | ORAL | Status: DC | PRN

## 2023-10-31 MED ORDER — ONDANSETRON HCL 4 MG/2ML IJ SOLN
INTRAMUSCULAR | Status: AC
  Filled 2023-10-31: qty 2

## 2023-10-31 MED ORDER — VASHE WOUND IRRIGATION OPTIME
TOPICAL | Status: DC | PRN
  Administered 2023-10-31: 34 [oz_av]

## 2023-10-31 MED ORDER — ORAL CARE MOUTH RINSE: 15.0000 mL | Freq: Once | OROMUCOSAL | Status: DC

## 2023-10-31 MED ORDER — DEXAMETHASONE SODIUM PHOSPHATE 10 MG/ML IJ SOLN
INTRAMUSCULAR | Status: AC
  Filled 2023-10-31: qty 1

## 2023-10-31 MED ORDER — PROPOFOL 10 MG/ML IV BOLUS
INTRAVENOUS | Status: AC
  Filled 2023-10-31: qty 20

## 2023-10-31 MED ORDER — ROPIVACAINE HCL 5 MG/ML IJ SOLN
INTRAMUSCULAR | Status: DC | PRN
  Administered 2023-10-31: 25 mL via PERINEURAL

## 2023-10-31 MED ORDER — LACTATED RINGERS IV SOLN: INTRAVENOUS | Status: DC

## 2023-10-31 MED ORDER — POVIDONE-IODINE 10 % EX SWAB: 2.0000 | Freq: Once | CUTANEOUS | Status: DC

## 2023-10-31 MED ORDER — CLONIDINE HCL (ANALGESIA) 100 MCG/ML EP SOLN
EPIDURAL | Status: DC | PRN
Start: 2023-10-31 — End: 2023-10-31
  Administered 2023-10-31: 60 ug

## 2023-10-31 MED ORDER — CHLORHEXIDINE GLUCONATE 4 % EX SOLN
60.0000 mL | Freq: Once | CUTANEOUS | Status: AC
  Administered 2023-10-31: 4 via TOPICAL
  Filled 2023-10-31: qty 30

## 2023-10-31 MED ORDER — ZINC SULFATE 220 (50 ZN) MG PO CAPS
220.0000 mg | ORAL_CAPSULE | Freq: Every day | ORAL | Status: DC
  Administered 2023-10-31 – 2023-11-06 (×7): 220 mg via ORAL
  Filled 2023-10-31 (×7): qty 1

## 2023-10-31 MED ORDER — CEFAZOLIN SODIUM-DEXTROSE 2-4 GM/100ML-% IV SOLN
2.0000 g | INTRAVENOUS | Status: DC
  Filled 2023-10-31: qty 100

## 2023-10-31 MED ORDER — VITAMIN C 500 MG PO TABS
1000.0000 mg | ORAL_TABLET | Freq: Every day | ORAL | Status: DC
  Administered 2023-10-31 – 2023-11-06 (×7): 1000 mg via ORAL
  Filled 2023-10-31 (×7): qty 2

## 2023-10-31 MED ORDER — DEXAMETHASONE SODIUM PHOSPHATE 10 MG/ML IJ SOLN
INTRAMUSCULAR | Status: DC | PRN
  Administered 2023-10-31: 5 mg via INTRAVENOUS

## 2023-10-31 MED ORDER — ONDANSETRON HCL 4 MG/2ML IJ SOLN
INTRAMUSCULAR | Status: DC | PRN
  Administered 2023-10-31: 4 mg via INTRAVENOUS

## 2023-10-31 MED ORDER — MEPERIDINE HCL 25 MG/ML IJ SOLN: 6.2500 mg | INTRAMUSCULAR | Status: DC | PRN

## 2023-10-31 MED ORDER — FENTANYL CITRATE (PF) 250 MCG/5ML IJ SOLN
INTRAMUSCULAR | Status: AC
  Filled 2023-10-31: qty 5

## 2023-10-31 MED ORDER — CHLORHEXIDINE GLUCONATE 0.12 % MT SOLN
15.0000 mL | Freq: Once | OROMUCOSAL | Status: DC
  Filled 2023-10-31: qty 15

## 2023-10-31 MED ORDER — FENTANYL CITRATE (PF) 100 MCG/2ML IJ SOLN
INTRAMUSCULAR | Status: AC
  Filled 2023-10-31: qty 2

## 2023-10-31 MED ORDER — PROPOFOL 10 MG/ML IV BOLUS
INTRAVENOUS | Status: DC | PRN
  Administered 2023-10-31: 20 mg via INTRAVENOUS
  Administered 2023-10-31: 30 mg via INTRAVENOUS
  Administered 2023-10-31: 130 ug/kg/min via INTRAVENOUS
  Administered 2023-10-31: 40 mg via INTRAVENOUS

## 2023-10-31 MED ORDER — SODIUM CHLORIDE 0.9 % IV SOLN: INTRAVENOUS | Status: DC

## 2023-10-31 MED ORDER — FENTANYL CITRATE (PF) 100 MCG/2ML IJ SOLN: 25.0000 ug | INTRAMUSCULAR | Status: DC | PRN

## 2023-10-31 MED ORDER — 0.9 % SODIUM CHLORIDE (POUR BTL) OPTIME
TOPICAL | Status: DC | PRN
  Administered 2023-10-31: 1000 mL

## 2023-10-31 MED ORDER — OXYCODONE HCL 5 MG/5ML PO SOLN: 5.0000 mg | Freq: Once | ORAL | Status: DC | PRN

## 2023-10-31 MED ORDER — FENTANYL CITRATE (PF) 250 MCG/5ML IJ SOLN
INTRAMUSCULAR | Status: DC | PRN
  Administered 2023-10-31: 50 ug via INTRAVENOUS

## 2023-10-31 MED ORDER — JUVEN PO PACK
1.0000 | PACK | Freq: Two times a day (BID) | ORAL | Status: DC
  Administered 2023-11-01 – 2023-11-06 (×9): 1 via ORAL
  Filled 2023-10-31 (×9): qty 1

## 2023-10-31 MED ORDER — MIDAZOLAM HCL 2 MG/2ML IJ SOLN
INTRAMUSCULAR | Status: AC
  Filled 2023-10-31: qty 2

## 2023-10-31 MED ORDER — LIDOCAINE 2% (20 MG/ML) 5 ML SYRINGE
INTRAMUSCULAR | Status: AC
  Filled 2023-10-31: qty 5

## 2023-10-31 SURGICAL SUPPLY — 34 items
BAG COUNTER SPONGE SURGICOUNT (BAG) ×1 IMPLANT
BENZOIN TINCTURE PRP APPL 2/3 (GAUZE/BANDAGES/DRESSINGS) ×1 IMPLANT
BLADE SAW RECIP 87.9 MT (BLADE) IMPLANT
BLADE SAW SGTL HD 18.5X60.5X1. (BLADE) ×1 IMPLANT
BLADE SURG 21 STRL SS (BLADE) ×1 IMPLANT
BNDG COHESIVE 4X5 TAN STRL LF (GAUZE/BANDAGES/DRESSINGS) IMPLANT
BNDG COHESIVE 6X5 TAN NS LF (GAUZE/BANDAGES/DRESSINGS) IMPLANT
BNDG GAUZE DERMACEA FLUFF 4 (GAUZE/BANDAGES/DRESSINGS) IMPLANT
CANISTER WOUND CARE 500ML ATS (WOUND CARE) IMPLANT
CLEANSER WND VASHE 34 (WOUND CARE) IMPLANT
COVER SURGICAL LIGHT HANDLE (MISCELLANEOUS) ×1 IMPLANT
DRAPE INCISE IOBAN 66X45 STRL (DRAPES) ×1 IMPLANT
DRAPE U-SHAPE 47X51 STRL (DRAPES) ×1 IMPLANT
DRSG ADAPTIC 3X8 NADH LF (GAUZE/BANDAGES/DRESSINGS) IMPLANT
DURAPREP 26ML APPLICATOR (WOUND CARE) ×1 IMPLANT
ELECTRODE REM PT RTRN 9FT ADLT (ELECTROSURGICAL) ×1 IMPLANT
GAUZE PAD ABD 8X10 STRL (GAUZE/BANDAGES/DRESSINGS) IMPLANT
GAUZE SPONGE 4X4 12PLY STRL (GAUZE/BANDAGES/DRESSINGS) IMPLANT
GLOVE BIOGEL PI IND STRL 9 (GLOVE) ×1 IMPLANT
GLOVE SURG ORTHO 9.0 STRL STRW (GLOVE) ×1 IMPLANT
GOWN STRL REUS W/ TWL XL LVL3 (GOWN DISPOSABLE) ×3 IMPLANT
GRAFT SKIN WND MICRO 38 (Tissue) IMPLANT
KIT BASIN OR (CUSTOM PROCEDURE TRAY) ×1 IMPLANT
KIT TURNOVER KIT B (KITS) ×1 IMPLANT
NS IRRIG 1000ML POUR BTL (IV SOLUTION) ×1 IMPLANT
PACK ORTHO EXTREMITY (CUSTOM PROCEDURE TRAY) ×1 IMPLANT
PAD ARMBOARD POSITIONER FOAM (MISCELLANEOUS) ×2 IMPLANT
SPONGE T-LAP 18X18 ~~LOC~~+RFID (SPONGE) IMPLANT
SUT ETHILON 2 0 PSLX (SUTURE) ×2 IMPLANT
TOWEL GREEN STERILE (TOWEL DISPOSABLE) ×1 IMPLANT
TOWEL GREEN STERILE FF (TOWEL DISPOSABLE) ×1 IMPLANT
TUBE CONNECTING 12X1/4 (SUCTIONS) ×1 IMPLANT
WATER STERILE IRR 1000ML POUR (IV SOLUTION) ×1 IMPLANT
YANKAUER SUCT BULB TIP NO VENT (SUCTIONS) ×1 IMPLANT

## 2023-10-31 NOTE — Plan of Care (Signed)

## 2023-10-31 NOTE — Anesthesia Preprocedure Evaluation (Addendum)
 Anesthesia Evaluation  Patient identified by MRN, date of birth, ID band Patient awake    Reviewed: Allergy & Precautions, NPO status , Patient's Chart, lab work & pertinent test results  Airway Mallampati: II  TM Distance: >3 FB Neck ROM: Full    Dental  (+) Poor Dentition, Loose, Chipped, Missing, Dental Advisory Given   Pulmonary pneumonia, COPD, Current Smoker   breath sounds clear to auscultation       Cardiovascular hypertension, Pt. on medications + CAD, + Peripheral Vascular Disease and +CHF  + dysrhythmias Atrial Fibrillation  Rhythm:Irregular Rate:Normal  Echo 10/2023 1. Left ventricular ejection fraction, by estimation, is 65 to 70%. The left ventricle has normal function. The left ventricle has no regional wall motion abnormalities. Left ventricular diastolic parameters are indeterminate.   2. Right ventricular systolic function is normal. The right ventricular size is mildly enlarged.   3. Left atrial size was moderately dilated.   4. The mitral valve is normal in structure. Trivial mitral valve regurgitation. No evidence of mitral stenosis.   5. The aortic valve is tricuspid. There is mild calcification of the aortic valve. There is mild thickening of the aortic valve. Aortic valve regurgitation is not visualized. Aortic valve sclerosis is present, with no evidence of aortic valve stenosis.  Aortic valve mean gradient measures 4.7 mmHg. Aortic valve Vmax measures 1.49 m/s.   6. The inferior vena cava is normal in size with greater than 50% respiratory variability, suggesting right atrial pressure of 3 mmHg.     Neuro/Psych  Headaches PSYCHIATRIC DISORDERS Anxiety        GI/Hepatic negative GI ROS,,,(+)     substance abuse  alcohol use  Endo/Other  negative endocrine ROS    Renal/GU negative Renal ROS     Musculoskeletal   Abdominal   Peds  Hematology  (+) Blood dyscrasia, anemia   Anesthesia Other  Findings   Reproductive/Obstetrics                             Anesthesia Physical Anesthesia Plan  ASA: 3  Anesthesia Plan: MAC   Post-op Pain Management: Tylenol  PO (pre-op)* and Regional block*   Induction: Intravenous  PONV Risk Score and Plan: 1 and Ondansetron  and Dexamethasone   Airway Management Planned:   Additional Equipment: None  Intra-op Plan:   Post-operative Plan:   Informed Consent: I have reviewed the patients History and Physical, chart, labs and discussed the procedure including the risks, benefits and alternatives for the proposed anesthesia with the patient or authorized representative who has indicated his/her understanding and acceptance.     Dental advisory given  Plan Discussed with: CRNA  Anesthesia Plan Comments:        Anesthesia Quick Evaluation

## 2023-10-31 NOTE — TOC Progression Note (Addendum)
 Transition of Care [facility]) - Progression Note    Patient Details  Name: Jason Spencer MRN: [id number] Date of Birth: 1951-05-28  [facility] CM/SW Contact  Carmon Christen, LCSWA Phone Number: 10/31/2023, 2:06 PM  Clinical Narrative:     Patient accepted SN[facility] bed with [facility]. CSW following to start insurance authorization closer to patients medically ready for dc. CSW will continue to follow.  Expected Discharge Plan: Home/Self Care Barriers to Discharge: Continued Medical Work up  Expected Discharge Plan and Services       Living arrangements for the past 2 months: Single Family Home                                       Social Determinants of Health (SDOH) Interventions SDOH Screenings   Food Insecurity: No Food Insecurity (10/21/2023)  Housing: Low Risk  (10/21/2023)  Transportation Needs: No Transportation Needs (10/21/2023)  Utilities: Not At Risk (10/21/2023)  Depression (PHQ2-9): Low Risk  (07/06/2023)  Financial Resource Strain: Low Risk  (01/08/2023)  Physical Activity: Insufficiently Active (01/08/2023)  Social Connections: Moderately Integrated (10/22/2023)  Tobacco Use: High Risk (10/30/2023)    Readmission Risk Interventions     No data to display

## 2023-10-31 NOTE — Op Note (Signed)
 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 Application Kerecis micro graft 38 cm.  SURGEON:  Timothy Ford, MD  PHYSICIAN ASSISTANT:None ANESTHESIA:   General  PREOPERATIVE INDICATIONS:  [name] is a  73 y.o. male with a diagnosis of Osteomyelitis left foot who failed conservative measures and elected for surgical management.    The risks benefits and alternatives were discussed with the patient preoperatively including but not limited to the risks of infection, bleeding, nerve injury, cardiopulmonary complications, the need for revision surgery, among others, and the patient was willing to proceed.  OPERATIVE IMPLANTS:   Implant Name Type Inv. Item Serial No. Manufacturer Lot No. LRB No. Used Action  GRAFT SKIN WND MICRO 38 - [id number] Tissue GRAFT SKIN WND MICRO 38  [employer] [id number] Left 1 Implanted    @ENCIMAGES @  OPERATIVE FINDINGS: Patient had large ischemic area of soft tissue defect laterally.  This necessitated tissue transfer for wound closure.  Patient's microcirculation is tenuous.  OPERATIVE PROCEDURE: Patient brought the operating room and underwent a regional anesthetic.  After adequate levels anesthesia were obtained patient's left lower extremity was prepped using DuraPrep draped into a sterile field a timeout was called.  A fishmouth incision was made just proximal to the ischemic gangrenous skin across the midfoot.  Patient underwent a transmetatarsal amputation with a reciprocating saw.  Further soft tissue was resected back to viable tissue.  Electrocautery was used hemostasis there was minimal petechial bleeding.  The wound was irrigated with Vashe tissue was transferred to close the wound of the transmetatarsal amputation with 2-0 nylon.  A sterile dressing was applied patient  was taken the PACU in stable condition.   DISCHARGE PLANNING:  Antibiotic duration: Continue antibiotics for 24 hours  Weightbearing: Nonweightbearing on the left  Pain medication: Continue opioid pathway  Dressing care/ Wound VAC: Dry dressing  Ambulatory devices: Crutches or walker  Discharge to: Discharge planning based on therapy recommendation.  Follow-up: In the office 1 week post operative.

## 2023-10-31 NOTE — Progress Notes (Signed)
 Okay to do Plavix  and anticoagulation (previously warfarin) and stop aspirin  for now.  In 1 month we can stop the Plavix  and go back to aspirin  warfarin to avoid triple therapy.  Young Hensen, MD [facility] Office: [phone]   Young Hensen

## 2023-10-31 NOTE — Progress Notes (Signed)
 Orthopedic Tech Progress Note Patient Details:  Jason Spencer [birth year] [id number]  Ortho Devices Type of Ortho Device: Postop shoe/boot Ortho Device/Splint Location: LLE Ortho Device/Splint Interventions: Ordered, Application, Removal   Post Interventions Patient Tolerated: Well Instructions Provided: Care of device  [name] 10/31/2023, 6:14 PM

## 2023-10-31 NOTE — Progress Notes (Signed)
 PROGRESS NOTE  Jason Spencer ZOX:[id number] DOB: [birth year]   PCP: Yevette Hem, FNP  Patient is from: Home  DOA: 10/21/2023 LOS: 10  Chief complaints Chief Complaint  Patient presents with   Circulatory Problem     Brief Narrative / Interim history: 73 year old M with PMH of COPD, CAD, atrial fibrillation/flutter on warfarin, HTN, HLD, EtOH use, anxiety, pleural effusion, psoriasis, PAD and tobacco use disorder presenting with progressive left 4th and 5th toe pain and discoloration for about 2 weeks, and admitted with critical limb ischemia with left 4th and 5th toe gangrene, and fifth metatarsal head osteomyelitis.   He had mild leukocytosis with elevated CRP to 9.9.  Has no fever.  Pro-Cal negative.  Foot x-ray showed subcutaneous gas about the head of the fifth metatarsal and proximal phalanx of left fifth toe with cortical loss medially from the fifth metatarsal head consistent with osteomyelitis.  Blood cultures obtained.  Patient was started on broad-spectrum antibiotics.  Orthopedic surgery and vascular surgery consulted.   CT angio showed severe bilateral multifocal high-grade stenosis from femoral arteries down, and aortic atherosclerosis.    INR reversed with vitamin K and he underwent LLE angiogram with shockwave lithotripsy, drug-coated balloon angioplasty and stent to left SFA and above knee left popliteal artery on 4/17.  Started on Plavix  and aspirin .  Remains on broad-spectrum antibiotics.  Plan for transmetatarsal amputation on Wednesday.  Hgb dropped about 4 g dropped without overt bleeding.  Anticoagulation discontinued.  GI consulted.  EGD showed esophageal plaque concerning for candidiasis (biopsied), large grade B reflux esophagitis, gastritis (biopsied) and normal duodenum.  GI recommended p.o. Protonix  40 mg twice daily, continuing antiplatelet and anticoagulation and Diflucan .  CT abdomen and pelvis demonstrated multiple small hematomas in right inguinal region  with surrounding stranding and fluid felt to be patient source of blood loss anemia.   Subjective: Seen and examined earlier this morning.  No major events overnight of this morning.  No complaints other than intermittent left foot pain.  Objective: Vitals:   10/30/23 1925 10/30/23 1956 10/31/23 0338 10/31/23 0949  BP: 130/69  134/71   Pulse: 89  77   Resp: 18  20   Temp: 98.2 F (36.8 C)  98.3 F (36.8 C)   TempSrc: Oral  Oral   SpO2: 100% 97% 100% 97%  Weight:      Height:        Examination:  GENERAL: No apparent distress.  Nontoxic. HEENT: MMM.  Vision and hearing grossly intact.  NECK: Supple.  No apparent JVD.  RESP:  No IWOB.  Fair aeration bilaterally. CVS:  RRR. Heart sounds normal.  ABD/GI/GU: BS+. Abd soft, NTND.  MSK/EXT:  Moves extremities.  Dry gangrene in left 4th and 5th toe.  Faint DP pulses.  Significant muscle mass and subcu fat loss. Dry skin in BLE.  See picture in media for more. SKIN: Bruising in right groin area.  No visible or palpable hematoma. NEURO: Awake, alert and oriented appropriately.  No apparent focal neuro deficit. PSYCH: Calm. Normal affect.   Consultants:  Orthopedic surgery Vascular surgery Gastroenterology  Procedures: 4/17-LLE angiogram with shockwave lithotripsy, drug-coated balloon angioplasty and stent to left SFA and above knee left popliteal artery.  See op note for details. 4/22-EGD showed esophageal plaque concerning for candidiasis (biopsied), large grade B reflux esophagitis, gastritis (biopsied) and normal duodenum.  Microbiology summarized: MRSA PCR screen nonreactive Blood cultures NGTD  Assessment and plan: Left lower extremity critical limb ischemia with 4th and 5th  dry gangrene: Present on arrival Severe bilateral PAD-as noted on CT angio Left foot osteomyelitis: Present on arrival -Left foot x-ray and CT angio as above. -S/p LLE angiogram with shockwave lithotripsy, drug-coated balloon angioplasty and stent  to left SFA and above knee left popliteal artery by Dr. Fulton Job on 4/17.  -Continue aspirin  and Plavix .  Anticoagulation on hold due to hematoma. -Patient might be at risk of bleeding on trip therapy.  Will discuss with the [facility] before discharge. -Continue Zyvox  and Zosyn  -Continue home Crestor .  LDL only 48. -Emphasized the importance of smoking cessation -Plan for transmetatarsal amputation on Wednesday   Chronic atrial fibrillation/flutter: Rate controlled.  On warfarin at home.  INR subtherapeutic after reversal for surgery.  -Hold heparin  today.  Will resume if H&H stable tomorrow, 4/23 -Continue holding warfarin in anticipation for surgical intervention.  Does not afford DOAC. -Consider home metoprolol   AKI: Baseline Cr~1.0. Could be due to IV contrast, IV antibiotics and lisinopril .  Resolved. Recent Labs    10/23/23 0402 10/24/23 0350 10/25/23 0424 10/25/23 1951 10/26/23 0418 10/27/23 0819 10/28/23 0535 10/29/23 0544 10/30/23 0623 10/31/23 0525  BUN 7* 5* 11 13 15 13 13 8 10 12   CREATININE 1.27* 1.36* 1.18 1.40* 1.49* 1.29* 1.22 1.10 1.08 1.18  - Continue holding lisinopril  - Avoid nephrotoxic meds - Continue monitoring  ABLA due to right inguinal region hematoma superimposed on anemia of chronic disease: Hgb dropped about 4 g after restarting anticoagulation with heparin .  CT abdomen and pelvis showed multiple small hematomas in right inguinal region.  Also noted some dark stool on 4/19.  Anemia panel with vitamin B12 deficiency.  Hgb improved and stable after 1 unit.  EGD as above. Recent Labs    10/24/23 0350 10/25/23 0424 10/25/23 1951 10/26/23 0418 10/27/23 0819 10/28/23 0535 10/28/23 1321 10/29/23 0544 10/30/23 0623 10/31/23 0525  HGB 11.0* 11.7* 11.6* 10.2* 8.7* 7.4* 7.7* 8.5* 8.7* 8.6*  -Continue DAPT given recent stent unless profuse bleeding -Continue p.o. Protonix  40 mg twice daily per GI -Received vitamin B12 injection 1000 mcg for 2 days.  Continue  p.o. -Monitor H&H.  Transfuse for Hgb <8.0 given history of CAD  Esophageal plaques/gastritis/esophagitis - GI recommended p.o. Protonix  40 mg twice daily - Follow biopsy from EGD  History of CAD: No cardiopulmonary symptoms. -Continue home metoprolol  and Crestor  -Now on aspirin  and Plavix  for PAD  Psoriasis: Did not follow-up with his dermatologist for a while -Continue clobetasol  cream twice daily   Essential hypertension: Normotensive for most part -Continue home amlodipine  and metoprolol  -Continue holding lisinopril   Chronic COPD: Stable -Continue home inhalers or hospital formulary -Encouraged smoking cessation  Alcohol abuse: No withdrawal symptoms.  CIWA 0. - Multivitamin, thiamine  and folic acid   Hypokalemia - Replenish and recheck as appropriate  Leukocytosis: Improved. -Continue monitoring -Antibiotics as above  Body mass index is 25.12 kg/m.           DVT prophylaxis:  On full dose anticoagulation  Code Status: Full code Family Communication: None at bedside Level of care: Med-Surg Status is: Inpatient Remains inpatient appropriate because: Critical limb ischemia, osteomyelitis and acute blood loss anemia   Final disposition: Likely home   55 minutes with more than 50% spent in reviewing records, counseling patient/family and coordinating care.   Sch Meds:  Scheduled Meds:  sodium chloride    Intravenous Once   amLODipine   5 mg Oral Daily   aspirin   81 mg Oral Daily   budeson-glycopyrrolate -formoterol   2 puff Inhalation BID  clobetasol  cream   Topical BID   clopidogrel   75 mg Oral Daily   vitamin B-12  1,000 mcg Oral Daily   fluconazole   100 mg Oral Daily   folic acid   1 mg Oral Daily   linezolid   600 mg Oral Q12H   metoprolol  succinate  100 mg Oral Daily   multivitamin with minerals  1 tablet Oral Daily   pantoprazole   40 mg Oral BID   povidone-iodine   2 Application Topical Once   rosuvastatin   20 mg Oral Daily   sodium chloride   flush  3 mL Intravenous Q12H   sodium chloride  flush  3 mL Intravenous Q12H   thiamine   100 mg Oral Daily   Continuous Infusions:   ceFAZolin  (ANCEF ) IV     piperacillin -tazobactam 3.375 g (10/31/23 0611)   PRN Meds:.acetaminophen  **OR** acetaminophen , acetaminophen , hydrALAZINE , HYDROcodone -acetaminophen , labetalol , morphine  injection, polyethylene glycol, sodium chloride  flush  Antimicrobials: Anti-infectives (From admission, onward)    Start     Dose/Rate Route Frequency Ordered Stop   10/31/23 1100  ceFAZolin  (ANCEF ) IVPB 2g/100 mL premix        2 g 200 mL/hr over 30 Minutes Intravenous On call to O.R. 10/31/23 1003 11/01/23 0559   10/30/23 1530  fluconazole  (DIFLUCAN ) tablet 100 mg        100 mg Oral Daily 10/30/23 1432 11/13/23 0959   10/22/23 1045  linezolid  (ZYVOX ) tablet 600 mg        600 mg Oral Every 12 hours 10/22/23 0954     10/22/23 0600  vancomycin  (VANCOCIN ) IVPB 1000 mg/200 mL premix  Status:  Discontinued        1,000 mg 200 mL/hr over 60 Minutes Intravenous Every 12 hours 10/21/23 1738 10/21/23 1743   10/21/23 2200  piperacillin -tazobactam (ZOSYN ) IVPB 3.375 g       Placed in "Followed by" Linked Group   3.375 g 12.5 mL/hr over 240 Minutes Intravenous Every 8 hours 10/21/23 1504     10/21/23 1745  linezolid  (ZYVOX ) IVPB 600 mg  Status:  Discontinued        600 mg 300 mL/hr over 60 Minutes Intravenous Every 12 hours 10/21/23 1744 10/22/23 0954   10/21/23 1530  clindamycin  (CLEOCIN ) IVPB 600 mg  Status:  Discontinued        600 mg 100 mL/hr over 30 Minutes Intravenous  Once 10/21/23 1523 10/21/23 1715   10/21/23 1515  vancomycin  (VANCOREADY) IVPB 1750 mg/350 mL  Status:  Discontinued        1,750 mg 175 mL/hr over 120 Minutes Intravenous  Once 10/21/23 1504 10/21/23 1743   10/21/23 1515  piperacillin -tazobactam (ZOSYN ) IVPB 3.375 g       Placed in "Followed by" Linked Group   3.375 g 100 mL/hr over 30 Minutes Intravenous  Once 10/21/23 1504 10/21/23 1804         I have personally reviewed the following labs and images: CBC: Recent Labs  Lab 10/25/23 0424 10/25/23 1951 10/26/23 0418 10/27/23 0819 10/28/23 0535 10/28/23 1321 10/29/23 0544 10/30/23 0623 10/31/23 0525  WBC 11.4*  --  14.7* 12.9* 11.3* 12.1* 11.3* 10.1 10.4  NEUTROABS 8.7*  --  12.2* 10.3*  --   --   --   --   --   HGB 11.7*   < > 10.2* 8.7* 7.4* 7.7* 8.5* 8.7* 8.6*  HCT 36.3*   < > 32.0* 27.5* 23.0* 24.3* 26.9* 26.6* 27.0*  MCV 95.8  --  97.9 98.2 98.7 98.8 95.1 94.3 94.7  PLT 372  --  367 311 260 274 267 252 265   < > = values in this interval not displayed.   BMP &GFR Recent Labs  Lab 10/27/23 0819 10/28/23 0535 10/29/23 0544 10/30/23 0623 10/31/23 0525  NA 138 135 138 136 136  K 3.6 3.4* 4.4 4.0 4.0  CL 104 104 105 106 103  CO2 24 21* 23 22 22   GLUCOSE 97 148* 85 82 88  BUN 13 13 8 10 12   CREATININE 1.29* 1.22 1.10 1.08 1.18  CALCIUM  8.6* 8.2* 8.7* 8.6* 8.5*  MG 2.1 2.1 2.0 2.0 2.0  PHOS 2.8 3.0 3.2 3.3 3.7   Estimated Creatinine Clearance: 60.1 mL/min (by C-G formula based on SCr of 1.18 mg/dL). Liver & Pancreas: Recent Labs  Lab 10/27/23 0819 10/28/23 0535 10/29/23 0544 10/30/23 0623 10/31/23 0525  ALBUMIN 2.2* 1.9* 2.1* 2.1* 2.2*   No results for input(s): "LIPASE", "AMYLASE" in the last 168 hours. No results for input(s): "AMMONIA" in the last 168 hours. Diabetic: No results for input(s): "HGBA1C" in the last 72 hours. No results for input(s): "GLUCAP" in the last 168 hours. Cardiac Enzymes: No results for input(s): "CKTOTAL", "CKMB", "CKMBINDEX", "TROPONINI" in the last 168 hours. No results for input(s): "PROBNP" in the last 8760 hours. Coagulation Profile: Recent Labs  Lab 10/27/23 0819 10/28/23 0535 10/29/23 0544 10/30/23 0623 10/31/23 0525  INR 1.3* 1.2 1.1 1.2 1.2   Thyroid  Function Tests: No results for input(s): "TSH", "T4TOTAL", "FREET4", "T3FREE", "THYROIDAB" in the last 72 hours. Lipid Profile: No results for  input(s): "CHOL", "HDL", "LDLCALC", "TRIG", "CHOLHDL", "LDLDIRECT" in the last 72 hours.  Anemia Panel: No results for input(s): "VITAMINB12", "FOLATE", "FERRITIN", "TIBC", "IRON", "RETICCTPCT" in the last 72 hours.  Urine analysis:    Component Value Date/Time   COLORURINE YELLOW 08/13/2014 1330   APPEARANCEUR CLEAR 08/13/2014 1330   LABSPEC 1.010 08/13/2014 1330   PHURINE 7.5 08/13/2014 1330   GLUCOSEU NEGATIVE 08/13/2014 1330   HGBUR NEGATIVE 08/13/2014 1330   BILIRUBINUR NEGATIVE 08/13/2014 1330   KETONESUR NEGATIVE 08/13/2014 1330   PROTEINUR NEGATIVE 08/13/2014 1330   UROBILINOGEN 0.2 08/13/2014 1330   NITRITE NEGATIVE 08/13/2014 1330   LEUKOCYTESUR NEGATIVE 08/13/2014 1330   Sepsis Labs: Invalid input(s): "PROCALCITONIN", "LACTICIDVEN"  Microbiology: Recent Results (from the past 240 hours)  Blood culture (routine x 2)     Status: None   Collection Time: 10/21/23  1:30 PM   Specimen: BLOOD LEFT ARM  Result Value Ref Range Status   Specimen Description BLOOD LEFT ARM  Final   Special Requests   Final    BOTTLES DRAWN AEROBIC AND ANAEROBIC Blood Culture results may not be optimal due to an inadequate volume of blood received in culture bottles   Culture   Final    NO GROWTH 5 DAYS Performed at [facility], [address], [place], [place] [address]    Report Status 10/26/2023 FINAL  Final  Blood culture (routine x 2)     Status: None   Collection Time: 10/21/23  2:24 PM   Specimen: BLOOD  Result Value Ref Range Status   Specimen Description BLOOD RIGHT ANTECUBITAL  Final   Special Requests   Final    BOTTLES DRAWN AEROBIC AND ANAEROBIC Blood Culture adequate volume   Culture   Final    NO GROWTH 5 DAYS Performed at [facility], [address], [place], [place] [address]    Report Status 10/26/2023 FINAL  Final  MRSA  Next Gen by PCR, Nasal     Status: None   Collection Time: 10/21/23  9:15 PM   Specimen: Nasal Mucosa; Nasal Swab  Result Value Ref  Range Status   MRSA by PCR Next Gen NOT DETECTED NOT DETECTED Final    Comment: (NOTE) The GeneXpert MRSA Assay (FDA approved for NASAL specimens only), is one component of a comprehensive MRSA colonization surveillance program. It is not intended to diagnose MRSA infection nor to guide or monitor treatment for MRSA infections. Test performance is not FDA approved in patients less than 74 years old. Performed at [facility], [address] [place], [place] [address]     Radiology Studies: No results found.     Sandrika Schwinn T. Terika Pillard [facility]  If 7PM-7AM, please contact night-coverage [email or web address] 10/31/2023, 1:18 PM

## 2023-10-31 NOTE — Transfer of Care (Signed)
 Immediate Anesthesia Transfer of Care Note  Patient: Jason Spencer  Procedure(s) Performed: AMPUTATION, FOOT, TRANSMETATARSAL (Left: Foot)  Patient Location: PACU  Anesthesia Type:MAC  Level of Consciousness: awake, alert , and oriented  Airway & Oxygen Therapy: Patient Spontanous Breathing  Post-op Assessment: Report given to RN and Post -op Vital signs reviewed and stable  Post vital signs: Reviewed and stable  Last Vitals:  Vitals Value Taken Time  BP 109/60 10/31/23 1705  Temp 36.9 C 10/31/23 1705  Pulse 71 10/31/23 1710  Resp 21 10/31/23 1709  SpO2 94 % 10/31/23 1710  Vitals shown include unfiled device data.  Last Pain:  Vitals:   10/31/23 1030  TempSrc:   PainSc: 4       Patients Stated Pain Goal: 2 (10/30/23 1044)  Complications: No notable events documented.

## 2023-10-31 NOTE — Anesthesia Procedure Notes (Addendum)
 Anesthesia Regional Block: Popliteal block   Pre-Anesthetic Checklist: , timeout performed,  Correct Patient, Correct Site, Correct Laterality,  Correct Procedure, Correct Position, site marked,  Risks and benefits discussed,  Surgical consent,  Pre-op evaluation,  At surgeon's request and post-op pain management  Laterality: Lower and Left  Prep: chloraprep       Needles:  Injection technique: Single-shot  Needle Type: Stimiplex     Needle Length: 10cm  Needle Gauge: 21     Additional Needles:   Procedures:,,,, ultrasound used (permanent image in chart),,   Motor weakness within 5 minutes.  Narrative:  Start time: 10/31/2023 3:56 PM End time: 10/31/2023 3:16 PM Injection made incrementally with aspirations every 5 mL.  Performed by: Personally  Anesthesiologist: Gorman Laughter, MD  Additional Notes: Nerve located and needle positioned with direct ultrasound guidance. Good perineural spread. Patient tolerated well.

## 2023-10-31 NOTE — Anesthesia Postprocedure Evaluation (Signed)
 Anesthesia Post Note  Patient: Jason Spencer  Procedure(s) Performed: EGD (ESOPHAGOGASTRODUODENOSCOPY) BIOPSY, SKIN, SUBCUTANEOUS TISSUE, OR MUCOUS MEMBRANE     Patient location during evaluation: PACU Anesthesia Type: MAC Level of consciousness: awake and alert Pain management: pain level controlled Vital Signs Assessment: post-procedure vital signs reviewed and stable Respiratory status: spontaneous breathing, nonlabored ventilation, respiratory function stable and patient connected to nasal cannula oxygen Cardiovascular status: stable and blood pressure returned to baseline Postop Assessment: no apparent nausea or vomiting Anesthetic complications: no   No notable events documented.  Last Vitals:  Vitals:   10/31/23 0338 10/31/23 0949  BP: 134/71   Pulse: 77   Resp: 20   Temp: 36.8 C   SpO2: 100% 97%    Last Pain:  Vitals:   10/31/23 1030  TempSrc:   PainSc: 4                  Melvenia Stabs

## 2023-10-31 NOTE — Anesthesia Postprocedure Evaluation (Signed)
 Anesthesia Post Note  Patient: Jason Spencer  Procedure(s) Performed: AMPUTATION, FOOT, TRANSMETATARSAL (Left: Foot)     Patient location during evaluation: PACU Anesthesia Type: MAC Level of consciousness: awake and alert Pain management: pain level controlled Vital Signs Assessment: post-procedure vital signs reviewed and stable Respiratory status: spontaneous breathing Cardiovascular status: stable Anesthetic complications: no   No notable events documented.  Last Vitals:  Vitals:   10/31/23 1705 10/31/23 1715  BP: 109/60 105/63  Pulse: 74 80  Resp: (!) 26 (!) 25  Temp: 36.9 C   SpO2: 98% 93%    Last Pain:  Vitals:   10/31/23 1705  TempSrc:   PainSc: 0-No pain                 Gorman Laughter

## 2023-10-31 NOTE — Interval H&P Note (Signed)
 History and Physical Interval Note:  10/31/2023 9:58 AM  Jason Spencer  has presented today for surgery, with the diagnosis of Osteomyelitis left foot.  The various methods of treatment have been discussed with the patient and family. After consideration of risks, benefits and other options for treatment, the patient has consented to  Procedure(s): AMPUTATION, FOOT, TRANSMETATARSAL (Left) as a surgical intervention.  The patient's history has been reviewed, patient examined, no change in status, stable for surgery.  I have reviewed the patient's chart and labs.  Questions were answered to the patient's satisfaction.     Tanna Loeffler V Lorrain Rivers

## 2023-10-31 NOTE — Progress Notes (Signed)
 Physical Therapy Treatment Patient Details Name: Jason Spencer MRN: [id number] DOB: 16-Aug-1950 Today's Date: 10/31/2023   History of Present Illness 73 y.o. male admitted 4/13  presenting with worsening left 4th and 5th toe pain. Osteomyelitis, amputation planned for 10/24/23.   PMH: hypertension, hyperlipidemia, atrial fibrillation/flutter, CAD, COPD, QT prolongation, dCHF, alcohol use, anxiety, pleural effusion    PT Comments  Pt greeted supine in bed, pleasant and agreeable to bed level PT session after maximum encouragement and education. He completed BLE exercises in supine and seated EOB to maintain AROM and prevent deconditioning from prolonged bed rest. Pt declined all OOB mobility d/t 6/10 L toe pain at rest. Will continue to follow acutely and advance appropriately.      If plan is discharge home, recommend the following: A little help with walking and/or transfers;A little help with bathing/dressing/bathroom;Assistance with cooking/housework;Assist for transportation;Help with stairs or ramp for entrance   Can travel by private vehicle     No  Equipment Recommendations  Rolling walker (2 wheels);BSC/3in1    Recommendations for Other Services       Precautions / Restrictions Precautions Precautions: Fall Recall of Precautions/Restrictions: Intact Restrictions Weight Bearing Restrictions Per Provider Order: Yes LLE Weight Bearing Per Provider Order: Weight bearing as tolerated     Mobility  Bed Mobility Overal bed mobility: Modified Independent Bed Mobility: Supine to Sit, Sit to Supine           General bed mobility comments: Pt sat up on L side of bed and returned to supine without physical assistance or VC. He utilized bed features, HOB elevated to 61deg, and bed rail.    Transfers                   General transfer comment: Pt declined to attempt    Ambulation/Gait               General Gait Details: Pt declined to attempt   Stairs              Wheelchair Mobility     Tilt Bed    Modified Rankin (Stroke Patients Only)       Balance Overall balance assessment: Needs assistance Sitting-balance support: No upper extremity supported, Feet supported Sitting balance-Leahy Scale: Good                                      Communication Communication Communication: No apparent difficulties  Cognition Arousal: Alert Behavior During Therapy: WFL for tasks assessed/performed   PT - Cognitive impairments: No apparent impairments                         Following commands: Intact      Cueing Cueing Techniques: Verbal cues, Visual cues  Exercises General Exercises - Lower Extremity Quad Sets: Supine, Both, 10 reps, Strengthening (x2 sets) Long Arc Quad: Seated, Both, AROM, 10 reps (x2 sets) Hip ABduction/ADduction: Supine, Both, 10 reps, Strengthening (x2 sets) Straight Leg Raises: Supine, Both, 10 reps, Strengthening (x2 sets; with 3 second hold up top) Hip Flexion/Marching: Seated, Both, AROM, 15 reps (x2 sets)    General Comments        Pertinent Vitals/Pain Pain Assessment Pain Assessment: 0-10 Pain Score: 6  Pain Location: L 4-5th toes Pain Descriptors / Indicators: Discomfort, Aching, Throbbing Pain Intervention(s): Monitored during session, Limited activity within patient's tolerance, Repositioned  Home Living                          Prior Function            PT Goals (current goals can now be found in the care plan section) Acute Rehab PT Goals Patient Stated Goal: Move better once my toes are gone Progress towards PT goals: Not progressing toward goals - comment (Pt appears to be self-limiting; session remained bed level)    Frequency    Min 2X/week      PT Plan      Co-evaluation              AM-PAC PT "6 Clicks" Mobility   Outcome Measure  Help needed turning from your back to your side while in a flat bed without using  bedrails?: None Help needed moving from lying on your back to sitting on the side of a flat bed without using bedrails?: None Help needed moving to and from a bed to a chair (including a wheelchair)?: A Little Help needed standing up from a chair using your arms (e.g., wheelchair or bedside chair)?: A Little Help needed to walk in hospital room?: Total Help needed climbing 3-5 steps with a railing? : Total 6 Click Score: 16    End of Session   Activity Tolerance: Other (comment);Patient tolerated treatment well;Patient limited by pain (Treatment limited secondary to pt participation) Patient left: in bed;with call bell/phone within reach Nurse Communication: Mobility status PT Visit Diagnosis: Muscle weakness (generalized) (M62.81);Unsteadiness on feet (R26.81);Pain Pain - Right/Left: Left Pain - part of body: Ankle and joints of foot     Time: 1610-9604 PT Time Calculation (min) (ACUTE ONLY): 26 min  Charges:    $Therapeutic Exercise: 23-37 mins PT General Charges $$ ACUTE PT VISIT: 1 Visit                     Glenford Lanes, PT, DPT Acute Rehabilitation Services Office: [phone] Secure Chat Preferred  Riva Chester 10/31/2023, 9:21 AM

## 2023-11-01 ENCOUNTER — Encounter (HOSPITAL_COMMUNITY): Payer: Self-pay | Admitting: Orthopedic Surgery

## 2023-11-01 DIAGNOSIS — I739 Peripheral vascular disease, unspecified: Secondary | ICD-10-CM | POA: Diagnosis not present

## 2023-11-01 DIAGNOSIS — M869 Osteomyelitis, unspecified: Secondary | ICD-10-CM | POA: Diagnosis not present

## 2023-11-01 DIAGNOSIS — I4892 Unspecified atrial flutter: Secondary | ICD-10-CM | POA: Diagnosis not present

## 2023-11-01 DIAGNOSIS — F411 Generalized anxiety disorder: Secondary | ICD-10-CM | POA: Diagnosis not present

## 2023-11-01 LAB — CBC
HCT: 26.7 % — ABNORMAL LOW (ref 39.0–52.0)
Hemoglobin: 8.6 g/dL — ABNORMAL LOW (ref 13.0–17.0)
MCH: 30.7 pg (ref 26.0–34.0)
MCHC: 32.2 g/dL (ref 30.0–36.0)
MCV: 95.4 fL (ref 80.0–100.0)
Platelets: 258 10*3/uL (ref 150–400)
RBC: 2.8 MIL/uL — ABNORMAL LOW (ref 4.22–5.81)
RDW: 13.7 % (ref 11.5–15.5)
WBC: 10.9 10*3/uL — ABNORMAL HIGH (ref 4.0–10.5)
nRBC: 0.2 % (ref 0.0–0.2)

## 2023-11-01 LAB — RENAL FUNCTION PANEL
Albumin: 2.3 g/dL — ABNORMAL LOW (ref 3.5–5.0)
Anion gap: 13 (ref 5–15)
BUN: 21 mg/dL (ref 8–23)
CO2: 19 mmol/L — ABNORMAL LOW (ref 22–32)
Calcium: 8.6 mg/dL — ABNORMAL LOW (ref 8.9–10.3)
Chloride: 103 mmol/L (ref 98–111)
Creatinine, Ser: 1.37 mg/dL — ABNORMAL HIGH (ref 0.61–1.24)
GFR, Estimated: 55 mL/min — ABNORMAL LOW (ref >60)
Glucose, Bld: 167 mg/dL — ABNORMAL HIGH (ref 70–99)
Phosphorus: 2.9 mg/dL (ref 2.5–4.6)
Potassium: 4 mmol/L (ref 3.5–5.1)
Sodium: 135 mmol/L (ref 135–145)

## 2023-11-01 LAB — MAGNESIUM: Magnesium: 2.1 mg/dL (ref 1.7–2.4)

## 2023-11-01 LAB — PROTIME-INR
INR: 1.3 — ABNORMAL HIGH (ref 0.8–1.2)
Prothrombin Time: 15.9 s — ABNORMAL HIGH (ref 11.4–15.2)

## 2023-11-01 LAB — HEPARIN LEVEL (UNFRACTIONATED): Heparin Unfractionated: 0.35 [IU]/mL (ref 0.30–0.70)

## 2023-11-01 MED ORDER — LINEZOLID 600 MG PO TABS
600.0000 mg | ORAL_TABLET | Freq: Two times a day (BID) | ORAL | Status: AC
  Administered 2023-11-01: 600 mg via ORAL
  Filled 2023-11-01: qty 1

## 2023-11-01 MED ORDER — WARFARIN SODIUM 5 MG PO TABS
5.0000 mg | ORAL_TABLET | Freq: Once | ORAL | Status: AC
  Administered 2023-11-01: 5 mg via ORAL
  Filled 2023-11-01: qty 1

## 2023-11-01 MED ORDER — WARFARIN - PHARMACIST DOSING INPATIENT: Freq: Every day | Status: DC

## 2023-11-01 MED ORDER — HEPARIN (PORCINE) 25000 UT/250ML-% IV SOLN
1450.0000 [IU]/h | INTRAVENOUS | Status: DC
  Administered 2023-11-01: 1450 [IU]/h via INTRAVENOUS
  Filled 2023-11-01: qty 250

## 2023-11-01 NOTE — Evaluation (Signed)
 Physical Therapy Re-Evaluation Patient Details Name: Jason Spencer MRN: [id number] DOB: [birth year] Today's Date: 11/01/2023  History of Present Illness  Pt is a 73 y.o. 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 PMH: hypertension, hyperlipidemia, atrial fibrillation/flutter, CAD, COPD, QT prolongation, dCHF, alcohol use, anxiety, pleural effusion.    Clinical Impression  Pt admitted with above diagnosis. PTA, pt was modI with functional mobility and ADLs reporting it took increased time to complete. Pt was furniture walking and hopping d/t L foot pain and took sponge baths. He resides with his sister in a split-level house that has 1 STE. Pt currently with functional limitations due to the deficits listed below (see PT Problem List). He is below his baseline function secondary to his weight bearing restriction and L foot pain. Pt was able to maintain LLE NWB throughout the session, but required education on this precaution. He required minA x2 for sit-to-stand and modA x2 for bed>chair transfer using RW. Pt will benefit from acute skilled PT to increase his independence and safety with mobility to allow discharge. Recommend post-acute rehab <3 hours/day of therapy.      If plan is discharge home, recommend the following: Assistance with cooking/housework;Assist for transportation;Help with stairs or ramp for entrance;A lot of help with bathing/dressing/bathroom;Two people to help with walking and/or transfers   Can travel by private vehicle   No    Equipment Recommendations Wheelchair (measurements PT);Wheelchair cushion (measurements PT);BSC/3in1;Rolling walker (2 wheels)  Recommendations for Other Services       Functional Status Assessment Patient has had a recent decline in their functional status and demonstrates the ability to make  significant improvements in function in a reasonable and predictable amount of time.     Precautions / Restrictions Precautions Precautions: Fall Recall of Precautions/Restrictions: Impaired Precaution/Restrictions Comments: Pt required education on WBing status. He requested to use his heel when transferring for added balance. Reviewed how no weight should be placed on LLE at this time and the need to keep the dressing in place and wear post-op shoe when mobilizing. Required Braces or Orthoses: Other Brace Other Brace: L foot post-op shoe Restrictions Weight Bearing Restrictions Per Provider Order: Yes LLE Weight Bearing Per Provider Order: Non weight bearing      Mobility  Bed Mobility Overal bed mobility: Needs Assistance Bed Mobility: Supine to Sit     Supine to sit: HOB elevated, Supervision     General bed mobility comments: Pt sat up on L side of bed with increased time and HOB significantly elevated. He brought BLE off EOB and used bedrails to pull trunk upright. Pt scooted fwd til feet flat with BUE support on bed,    Transfers Overall transfer level: Needs assistance Equipment used: Rolling walker (2 wheels) Transfers: Sit to/from Stand, Bed to chair/wheelchair/BSC Sit to Stand: Mod assist, +2 physical assistance, +2 safety/equipment, From elevated surface Stand pivot transfers: Min assist, +2 physical assistance, +2 safety/equipment, From elevated surface         General transfer comment: Pt stood from raised bed height. VC for proper hand positioning using RW. He required modA x2 to power up into standing while maintaining LLE NWB. Good eccentric control with sitting. Pt transferred to recliner chair on left by using a hopping technique of increased WBing on RW grips to advance RLE, pivoting on foot with PT facilitating hips towards chair  while maintaining LLE NWB.    Ambulation/Gait               General Gait Details: Deferred d/t increased pain with  transfer.  Stairs            Wheelchair Mobility     Tilt Bed    Modified Rankin (Stroke Patients Only)       Balance Overall balance assessment: Needs assistance Sitting-balance support: No upper extremity supported, Feet supported Sitting balance-Leahy Scale: Good Sitting balance - Comments: Pt sat EOB with supervision. He donned sock on RLE and post-op shoe on LLE with intermittent support from OT and no LOB.   Standing balance support: Bilateral upper extremity supported, During functional activity, Reliant on assistive device for balance Standing balance-Leahy Scale: Poor Standing balance comment: Pt dependent on RW for stability when upright and min-modA x2 during transfers for safety.                             Pertinent Vitals/Pain Pain Assessment Pain Assessment: 0-10 Pain Score: 9  Pain Location: L foot Pain Descriptors / Indicators: Throbbing, Discomfort, Aching, Nagging, Pressure, Operative site guarding, Grimacing Pain Intervention(s): Monitored during session, Limited activity within patient's tolerance, Repositioned    Home Living Family/patient expects to be discharged to:: Private residence Living Arrangements: Other relatives (Sister) Available Help at Discharge: Family;Available PRN/intermittently Type of Home: House Home Access: Stairs to enter Entrance Stairs-Rails: None Entrance Stairs-Number of Steps: 1 Alternate Level Stairs-Number of Steps: 2 Home Layout: One level;Multi-level Home Equipment: None Additional Comments: Of note, pt's sister was being treated at [facility] for PNA.    Prior Function Prior Level of Function : Independent/Modified Independent             Mobility Comments: Ambulated via furniture walking and hopping to limit WBing on LLE secondary to pain. Pt denies falls in the last 55mo. ADLs Comments: Pt managed ADLs. He reports sponge baths at baseline.     Extremity/Trunk Assessment   Upper  Extremity Assessment Upper Extremity Assessment: Defer to OT evaluation    Lower Extremity Assessment Lower Extremity Assessment: LLE deficits/detail (RLE overall WFL for tasks assessed) LLE Deficits / Details: Pt s/p L midfoot amputation in post-op dressing. He demonstrated hip and knee AROM WFL and grossly 3/5 strength. LLE: Unable to fully assess due to pain    Cervical / Trunk Assessment Cervical / Trunk Assessment: Normal  Communication   Communication Communication: No apparent difficulties    Cognition Arousal: Alert Behavior During Therapy: WFL for tasks assessed/performed   PT - Cognitive impairments: No apparent impairments                         Following commands: Intact       Cueing Cueing Techniques: Verbal cues, Gestural cues     General Comments General comments (skin integrity, edema, etc.): VSS on RA.    Exercises     Assessment/Plan    PT Assessment Patient needs continued PT services  PT Problem List Decreased activity tolerance;Decreased balance;Decreased mobility;Decreased strength;Decreased range of motion;Decreased knowledge of use of DME;Decreased safety awareness;Decreased knowledge of precautions;Pain;Decreased skin integrity;Impaired sensation       PT Treatment Interventions DME instruction;Gait training;Functional mobility training;Therapeutic activities;Therapeutic exercise;Balance training;Patient/family education;Stair training    PT Goals (Current goals can be found in the Care Plan section)  Acute Rehab PT Goals Patient Stated Goal: Return home once  I can walk again PT Goal Formulation: With patient Time For Goal Achievement: 11/15/23 Potential to Achieve Goals: Good    Frequency Min 2X/week     Co-evaluation PT/OT/SLP Co-Evaluation/Treatment: Yes Reason for Co-Treatment: To address functional/ADL transfers PT goals addressed during session: Mobility/safety with mobility;Balance         AM-PAC PT "6 Clicks"  Mobility  Outcome Measure Help needed turning from your back to your side while in a flat bed without using bedrails?: A Little Help needed moving from lying on your back to sitting on the side of a flat bed without using bedrails?: A Little Help needed moving to and from a bed to a chair (including a wheelchair)?: Total Help needed standing up from a chair using your arms (e.g., wheelchair or bedside chair)?: Total Help needed to walk in hospital room?: Total Help needed climbing 3-5 steps with a railing? : Total 6 Click Score: 10    End of Session Equipment Utilized During Treatment: Gait belt Activity Tolerance: Patient tolerated treatment well;Patient limited by pain Patient left: in chair;with call bell/phone within reach;with chair alarm set Nurse Communication: Mobility status PT Visit Diagnosis: Muscle weakness (generalized) (M62.81);Unsteadiness on feet (R26.81);Pain;Difficulty in walking, not elsewhere classified (R26.2) Pain - Right/Left: Left Pain - part of body: Ankle and joints of foot    Time: 1442-1510 PT Time Calculation (min) (ACUTE ONLY): 28 min   Charges:   PT Evaluation $PT Re-evaluation: 1 Re-eval   PT General Charges $$ ACUTE PT VISIT: 1 Visit         Glenford Lanes, PT, DPT Acute Rehabilitation Services Office: [phone] Secure Chat Preferred  Riva Chester 11/01/2023, 3:50 PM

## 2023-11-01 NOTE — Evaluation (Signed)
 Occupational Therapy Re-Evaluation Patient Details Name: Jason Spencer MRN: [id number] DOB: June 08, 1951 Today's Date: 11/01/2023   History of Present Illness   Pt is a 73 y.o. 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 PMH: hypertension, hyperlipidemia, atrial fibrillation/flutter, CAD, COPD, QT prolongation, dCHF, alcohol use, anxiety, pleural effusion     Clinical Impressions Pt re-evaluated this day as pt is now s/p L midfoot amputation s/p revascularization to the LLE on 4/23. Pt now presents with decreased activity tolerance, decreased balance, pain affecting functional level, decreased knowledge of precautions, decreased knowledge of DME/AE, and decreased safety and independence with functional tasks. Pt currently demonstrates ability to complete UB ADLs Independent to Set up, LB ADLs with Set up-Supervision to Mod assist +2, and functional step-pivot transfers with a RW with Mod assist +2, all while adhering to L LE NWB precaution. Pt participated well in session. Pt VSS on RA. Pt will benefit from continued acute skilled OT services to address deficits outlined below and increase safety and independence with functional tasks. OT goals and discharge recommendations updated today based on pt current functional level and progress toward goals. Post acute discharge, pt will benefit from intensive inpatient skilled rehab services < 3 hours per day to maximize rehab potential.      If plan is discharge home, recommend the following:   Two people to help with walking and/or transfers;A lot of help with bathing/dressing/bathroom;Assistance with cooking/housework;Assist for transportation;Help with stairs or ramp for entrance     Functional Status Assessment   Patient has had a recent decline in their functional status and demonstrates the  ability to make significant improvements in function in a reasonable and predictable amount of time.     Equipment Recommendations   BSC/3in1;Other (comment) (RW)     Recommendations for Other Services         Precautions/Restrictions   Precautions Precautions: Fall Recall of Precautions/Restrictions: Impaired Precaution/Restrictions Comments: Pt required education on WBing status. He requested to use his heel when transferring for added balance. Reviewed how no weight should be placed on LLE at this time and the need to keep the dressing in place and wear post-op shoe when mobilizing. Required Braces or Orthoses: Other Brace Other Brace: L foot post-op shoe Restrictions Weight Bearing Restrictions Per Provider Order: Yes LLE Weight Bearing Per Provider Order: Non weight bearing     Mobility Bed Mobility Overal bed mobility: Needs Assistance Bed Mobility: Supine to Sit     Supine to sit: Supervision, HOB elevated     General bed mobility comments: Pt sat up on L side of bed with increased time and HOB significantly elevated. He brought BLE off EOB and used bedrails to pull trunk upright. Pt scooted fwd until feet flat with BUE support on bed.    Transfers Overall transfer level: Needs assistance Equipment used: Rolling walker (2 wheels) Transfers: Sit to/from Stand, Bed to chair/wheelchair/BSC Sit to Stand: Mod assist, +2 physical assistance, +2 safety/equipment, From elevated surface Stand pivot transfers: Min assist, +2 physical assistance, +2 safety/equipment, From elevated surface         General transfer comment: Pt stood from raised bed height. VC for proper hand positioning using RW. He required modA x2 to power up into standing while maintaining LLE NWB. Good eccentric control with sitting. Pt transferred to recliner chair on left by using a hopping  technique of increased WBing on RW grips to advance RLE, pivoting on foot with PT facilitating hips towards  chair while maintaining LLE NWB.      Balance Overall balance assessment: Needs assistance Sitting-balance support: No upper extremity supported, Feet supported Sitting balance-Leahy Scale: Good Sitting balance - Comments: Pt sat EOB with supervision. He donned sock on RLE and post-op shoe on LLE with intermittent support from OT and no LOB.   Standing balance support: Bilateral upper extremity supported, During functional activity, Reliant on assistive device for balance Standing balance-Leahy Scale: Poor Standing balance comment: Pt dependent on RW for stability when upright and min-modA x2 during transfers for safety.                           ADL either performed or assessed with clinical judgement   ADL Overall ADL's : Needs assistance/impaired Eating/Feeding: Independent;Sitting   Grooming: Set up;Sitting   Upper Body Bathing: Set up;Sitting   Lower Body Bathing: Supervison/ safety;Contact guard assist;Sitting/lateral leans;Cueing for compensatory techniques   Upper Body Dressing : Set up;Sitting   Lower Body Dressing: Set up;Supervision/safety;Contact guard assist;Cueing for compensatory techniques;Sitting/lateral leans (with mildly increased time) Lower Body Dressing Details (indicate cue type and reason): Largely Set up-Supervision but with intermittent CGA to donn post-op shoe Toilet Transfer: Moderate assistance;+2 for physical assistance;+2 for safety/equipment;Rolling walker (2 wheels);BSC/3in1 (step-pivot transfer) Toilet Transfer Details (indicate cue type and reason): simulated bed to chair Toileting- Clothing Manipulation and Hygiene: Moderate assistance;+2 for physical assistance;+2 for safety/equipment;Sit to/from stand;Set up;Sitting/lateral lean Toileting - Clothing Manipulation Details (indicate cue type and reason): Set up for toileting hygiene in sitting/lateral leans and Mod assist +2 for clothing management in sit/stand       General ADL  Comments: Pt requiring mildly increased assist this session as pt is now s/p L midfoot amputation s/p revascularization to the LLE on 4/23. OT expets pt will make good progress toward goals as pain managment improves. OT initiated education in strategies (belling breathing and 5-4-3-2-1 grounding meditation) for improved pain management with pt verbalizing understanding. Pt will benefit from reinforcement of education.     Vision Patient Visual Report: No change from baseline Vision Assessment?: No apparent visual deficits Additional Comments: Vision appears [facility] for tasks assessed. Not formally screened or assessed.     Perception         Praxis         Pertinent Vitals/Pain Pain Assessment Pain Assessment: 0-10 Pain Score: 9  Pain Location: L foot Pain Descriptors / Indicators: Throbbing, Discomfort, Aching, Nagging, Pressure, Operative site guarding, Grimacing Pain Intervention(s): Limited activity within patient's tolerance, Monitored during session, Premedicated before session, Repositioned     Extremity/Trunk Assessment Upper Extremity Assessment Upper Extremity Assessment: Right hand dominant;Overall [facility] for tasks assessed   Lower Extremity Assessment Lower Extremity Assessment: Defer to PT evaluation LLE Deficits / Details: Pt s/p L midfoot amputation in post-op dressing. He demonstrated hip and knee AROM WFL and grossly 3/5 strength. LLE: Unable to fully assess due to pain   Cervical / Trunk Assessment Cervical / Trunk Assessment: Normal   Communication Communication Communication: No apparent difficulties   Cognition Arousal: Alert Behavior During Therapy: WFL for tasks assessed/performed Cognition: Cognition impaired, No family/caregiver present to determine baseline     Awareness: Intellectual awareness intact, Online awareness impaired Memory impairment (select all impairments): Short-term memory, Working memory Attention impairment (select first level of  impairment): Selective attention Executive functioning impairment (select all  impairments): Organization, Problem solving, Reasoning OT - Cognition Comments: PT AAOx4 and pleasant throughout session with cognitive deficits noted above.                 Following commands: Intact       Cueing  General Comments   Cueing Techniques: Verbal cues;Gestural cues  VSS on RA throughout session.   Exercises     Shoulder Instructions      Home Living Family/patient expects to be discharged to:: Private residence Living Arrangements: Other relatives (sister) Available Help at Discharge: Family;Available PRN/intermittently Type of Home: House Home Access: Stairs to enter [employer] of Steps: 1 Entrance Stairs-Rails: None Home Layout: One level;Multi-level Alternate Level Stairs-Number of Steps: 2 Alternate Level Stairs-Rails: Can reach both;Left;Right Bathroom Shower/Tub: Tub/shower unit;Sponge bathes at baseline   Bathroom Toilet: Handicapped height     Home Equipment: None   Additional Comments: Of note, pt's sister was being treated at [employer] for PNA.      Prior Functioning/Environment Prior Level of Function : Independent/Modified Independent             Mobility Comments: Ambulated via furniture walking and hopping to limit WBing on LLE secondary to pain. Pt denies falls in the last 11mo. ADLs Comments: Pt Independent to Mod I with ADLs. He reports sponge baths at baseline.    OT Problem List: Decreased activity tolerance;Impaired balance (sitting and/or standing);Decreased knowledge of use of DME or AE;Decreased knowledge of precautions;Pain;Decreased cognition;Decreased safety awareness   OT Treatment/Interventions: Self-care/ADL training;Therapeutic exercise;DME and/or AE instruction;Therapeutic activities;Cognitive remediation/compensation;Patient/family education;Balance training      OT Goals(Current goals can be found in the care plan  section)   Acute Rehab OT Goals Patient Stated Goal: to have less pain and to return home OT Goal Formulation: With patient Time For Goal Achievement: 11/15/23 Potential to Achieve Goals: Good ADL Goals Pt Will Perform Upper Body Dressing: Independently;sitting Pt Will Perform Lower Body Dressing: with modified independence;sitting/lateral leans Pt Will Transfer to Toilet: with contact guard assist;ambulating;bedside commode (with least restrictive AD) Pt Will Perform Toileting - Clothing Manipulation and hygiene: with contact guard assist;sit to/from stand Additional ADL Goal #1: Patient will demonstrate understanding through teach back of strategies for improved pain management (i.e. belly breathing, guided meditation, sensory-based interventions) in order to increase safety and independence with functional tasks.   OT Frequency:  Min 2X/week    Co-evaluation PT/OT/SLP Co-Evaluation/Treatment: Yes Reason for Co-Treatment: To address functional/ADL transfers PT goals addressed during session: Mobility/safety with mobility;Balance OT goals addressed during session: ADL's and self-care      AM-PAC OT "6 Clicks" Daily Activity     Outcome Measure Help from another person eating meals?: None Help from another person taking care of personal grooming?: A Little Help from another person toileting, which includes using toliet, bedpan, or urinal?: A Lot Help from another person bathing (including washing, rinsing, drying)?: A Little Help from another person to put on and taking off regular upper body clothing?: A Little Help from another person to put on and taking off regular lower body clothing?: A Little 6 Click Score: 18   End of Session Equipment Utilized During Treatment: Gait belt;Rolling walker (2 wheels);Other (comment) (post-op shoe) Nurse Communication: Mobility status;Other (comment) (Pt sitting up in Chair. Pain level.)  Activity Tolerance: Patient tolerated treatment  well;Patient limited by pain Patient left: in chair;with call bell/phone within reach;with chair alarm set  OT Visit Diagnosis: Unsteadiness on feet (R26.81);Other abnormalities of gait and mobility (R26.89);Pain  Time: 1442-1510 OT Time Calculation (min): 28 min Charges:  OT General Charges $OT Visit: 1 Visit OT Evaluation $OT Re-eval: 1 Re-eval  [place] Escutia "Darral Ellis., OTR/L, MA Acute Rehab [phone]   Walt Gunner 11/01/2023, 5:20 PM

## 2023-11-01 NOTE — Progress Notes (Signed)
 Patient ID: Jason Spencer, male   DOB: 1950-08-30, 73 y.o.   MRN: [id number] Patient is postoperative day 1 midfoot amputation of left foot status post revascularization to the left lower extremity.  Patient had minimal petechial bleeding.  Discussed with patient this wound will have difficulty healing.  Discussed the importance of minimizing weightbearing.  Patient states he has no pain and the dressing is dry.  Patient may discharge when he is safe with therapy and I will follow-up in the office in 1 week.

## 2023-11-01 NOTE — Progress Notes (Signed)
 PHARMACY - ANTICOAGULATION CONSULT NOTE  Pharmacy Consult for heparin  Indication: Afib/PAD  Labs: Recent Labs    10/30/23 0623 10/31/23 0525 11/01/23 0429 11/01/23 2248  HGB 8.7* 8.6* 8.6*  --   HCT 26.6* 27.0* 26.7*  --   PLT 252 265 258  --   LABPROT 15.2 14.9 15.9*  --   INR 1.2 1.2 1.3*  --   HEPARINUNFRC  --   --   --  0.35  CREATININE 1.08 1.18 1.37*  --    Assessment/Plan:  73yo male therapeutic on heparin  with initial dosing while INR below goal. Will continue infusion at current rate of 1450 units/hr and confirm stable with am labs.  Lonnie Roberts, PharmD, BCPS 11/01/2023 11:47 PM

## 2023-11-01 NOTE — Progress Notes (Signed)
 PROGRESS NOTE  Jason Spencer BJY:[id number] DOB: [birth year]   PCP: Yevette Hem, FNP  Patient is from: Home  DOA: 10/21/2023 LOS: 11  Chief complaints Chief Complaint  Patient presents with   Circulatory Problem     Brief Narrative / Interim history: 72 year old M with PMH of COPD, CAD, atrial fibrillation/flutter on warfarin, HTN, HLD, EtOH use, anxiety, pleural effusion, psoriasis, PAD and tobacco use disorder presenting with progressive left 4th and 5th toe pain and discoloration for about 2 weeks, and admitted with critical limb ischemia with left 4th and 5th toe gangrene, and fifth metatarsal head osteomyelitis.   He had mild leukocytosis with elevated CRP to 9.9.  Has no fever.  Pro-Cal negative.  Foot x-ray showed subcutaneous gas about the head of the fifth metatarsal and proximal phalanx of left fifth toe with cortical loss medially from the fifth metatarsal head consistent with osteomyelitis.  Blood cultures obtained.  Patient was started on broad-spectrum antibiotics.  Orthopedic surgery and vascular surgery consulted.   CT angio showed severe bilateral multifocal high-grade stenosis from femoral arteries down, and aortic atherosclerosis.    INR reversed with vitamin K and he underwent LLE angiogram with shockwave lithotripsy, drug-coated balloon angioplasty and stent to left SFA and above knee left popliteal artery on 4/17.  Started on Plavix  and aspirin .  Remains on broad-spectrum antibiotics.  Plan for transmetatarsal amputation on Wednesday.  Hgb dropped about 4 g dropped without overt bleeding.  Heparin  discontinued.  GI consulted.  EGD showed esophageal plaque concerning for candidiasis (biopsied), large grade B reflux esophagitis, gastritis (biopsied) and normal duodenum.  GI recommended p.o. Protonix  40 mg twice daily, continuing antiplatelet and anticoagulation and Diflucan .  CT abdomen and pelvis demonstrated multiple small hematomas in right inguinal region with  surrounding stranding and fluid felt to be patient source of blood loss anemia.  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  Anticoagulation resumed with warfarin and heparin  bridge on 4/24.  Aspirin  discontinued after discussion with vascular surgery.   Subjective: Seen and examined earlier this morning.  No major events overnight of this morning.  No complaints.  Objective: Vitals:   10/31/23 2317 11/01/23 0621 11/01/23 0847 11/01/23 1120  BP: 125/63 125/68  120/62  Pulse: 78 75  88  Resp: 20 18  16   Temp: 97.6 F (36.4 C) 98.4 F (36.9 C)  97.8 F (36.6 C)  TempSrc: Oral Oral  Oral  SpO2: 100% 100% 99% 100%  Weight:    80.1 kg  Height:    5\' 10"  (1.778 m)    Examination:  GENERAL: No apparent distress.  Nontoxic. HEENT: MMM.  Vision and hearing grossly intact.  NECK: Supple.  No apparent JVD.  RESP:  No IWOB.  Fair aeration bilaterally. CVS:  RRR. Heart sounds normal.  ABD/GI/GU: BS+. Abd soft, NTND.  MSK/EXT:  Moves extremities.  Dressing over left TMA amputation DCI. SKIN: Bruising in right groin area.  No visible or palpable hematoma. NEURO: Awake, alert and oriented appropriately.  No apparent focal neuro deficit. PSYCH: Calm. Normal affect.   Consultants:  Orthopedic surgery Vascular surgery Gastroenterology  Procedures: 4/17-LLE angiogram with shockwave lithotripsy, drug-coated balloon angioplasty and stent to left SFA and above knee left popliteal artery.  See op note for details. 4/22-EGD showed esophageal plaque concerning for candidiasis (biopsied), large grade B reflux esophagitis, gastritis (biopsied) and normal duodenum.  Microbiology summarized: MRSA PCR screen nonreactive Blood cultures NGTD  Assessment and plan: Left lower extremity critical limb ischemia with 4th  and 5th dry gangrene: Present on arrival Severe bilateral PAD-as noted on CT angio Left foot osteomyelitis: Present on arrival -Left foot x-ray and CT angio as above. -4/17-LLE  angiogram with shockwave lithotripsy, drug-coated balloon angioplasty and stent to left SFA and above knee left popliteal artery by Dr. Fulton Job  -4/23-left TMT amputation by Dr. Julio Ohm. -Vascular: Plavix  for 1 month followed by low-dose aspirin  since patient is on anticoagulation -Antibiotics discontinued 24 hours postop per orthopedic surgery. -Per Ortho, antibiotics for 24 hours postop, NWB, dry dressing and outpatient follow-up in 1 week -Continue home Crestor .  LDL only 48. -Emphasized the importance of smoking cessation -Plan for transmetatarsal amputation on Wednesday -PT/OT.   Chronic atrial fibrillation/flutter: Rate controlled.  On warfarin at home.  INR subtherapeutic after reversal for surgery.  -Consider home metoprolol  -Resume warfarin with heparin  bridge -Optimize electrolytes - Monitor INR  AKI: Baseline Cr~1.0. Could be due to IV contrast, IV antibiotics and lisinopril .  Cr slightly up. Recent Labs    10/24/23 0350 10/25/23 0424 10/25/23 1951 10/26/23 0418 10/27/23 1610 10/28/23 0535 10/29/23 0544 10/30/23 0623 10/31/23 0525 11/01/23 0429  BUN 5* 11 13 15 13 13 8 10 12 21   CREATININE 1.36* 1.18 1.40* 1.49* 1.29* 1.22 1.10 1.08 1.18 1.37*  - Continue holding lisinopril  - Avoid nephrotoxic meds - Continue monitoring  ABLA due to right inguinal region hematoma superimposed on anemia of chronic disease: Hgb dropped about 4 g after restarting anticoagulation with heparin .  CT abdomen and pelvis showed multiple small hematomas in right inguinal region.  Also noted some dark stool on 4/19.  Anemia panel with vitamin B12 deficiency.  Hgb improved and stable after 1 unit.  EGD as above. Recent Labs    10/25/23 0424 10/25/23 1951 10/26/23 0418 10/27/23 0819 10/28/23 0535 10/28/23 1321 10/29/23 0544 10/30/23 0623 10/31/23 0525 11/01/23 0429  HGB 11.7* 11.6* 10.2* 8.7* 7.4* 7.7* 8.5* 8.7* 8.6* 8.6*  -Antiplatelet and anticoagulation as above. -Continue p.o. Protonix   40 mg twice daily per GI -Received vitamin B12 injection 1000 mcg for 2 days.  Continue p.o. -Monitor H&H.  Transfuse for Hgb <8.0 given history of CAD  Esophageal plaques/gastritis/esophagitis - GI recommended p.o. Protonix  40 mg twice daily - Follow biopsy from EGD  History of CAD: No cardiopulmonary symptoms. -Continue home metoprolol  and Crestor  - Continue Plavix  as above  Psoriasis: Did not follow-up with his dermatologist for a while -Continue clobetasol  cream twice daily   Essential hypertension: Normotensive for most part -Continue home amlodipine  and metoprolol  -Continue holding lisinopril   Chronic COPD: Stable -Continue home inhalers or hospital formulary -Encouraged smoking cessation  Alcohol abuse: No withdrawal symptoms.  CIWA 0. - Multivitamin, thiamine  and folic acid   Hypokalemia - Replenish and recheck as appropriate  Leukocytosis: Improved. -Continue monitoring -Antibiotics as above  Body mass index is 25.34 kg/m.           DVT prophylaxis:  SCD's Start: 10/31/23 1738On full dose anticoagulation  Code Status: Full code Family Communication: None at bedside Level of care: Med-Surg Status is: Inpatient Remains inpatient appropriate because: Critical limb ischemia, osteomyelitis and acute blood loss anemia   Final disposition: Likely home   55 minutes with more than 50% spent in reviewing records, counseling patient/family and coordinating care.   Sch Meds:  Scheduled Meds:  amLODipine   5 mg Oral Daily   vitamin C   1,000 mg Oral Daily   budeson-glycopyrrolate -formoterol   2 puff Inhalation BID   clobetasol  cream   Topical BID   clopidogrel   75 mg Oral Daily   vitamin B-12  1,000 mcg Oral Daily   fluconazole   100 mg Oral Daily   folic acid   1 mg Oral Daily   linezolid   600 mg Oral Q12H   metoprolol  succinate  100 mg Oral Daily   multivitamin with minerals  1 tablet Oral Daily   nutrition supplement (JUVEN)  1 packet Oral BID BM    pantoprazole   40 mg Oral BID   rosuvastatin   20 mg Oral Daily   sodium chloride  flush  3 mL Intravenous Q12H   sodium chloride  flush  3 mL Intravenous Q12H   thiamine   100 mg Oral Daily   zinc  sulfate (50mg  elemental zinc )  220 mg Oral Daily   Continuous Infusions:  sodium chloride      piperacillin -tazobactam 3.375 g (11/01/23 0609)   PRN Meds:.acetaminophen  **OR** acetaminophen , acetaminophen , hydrALAZINE , HYDROcodone -acetaminophen , labetalol , morphine  injection, polyethylene glycol, sodium chloride  flush  Antimicrobials: Anti-infectives (From admission, onward)    Start     Dose/Rate Route Frequency Ordered Stop   10/31/23 1100  ceFAZolin  (ANCEF ) IVPB 2g/100 mL premix  Status:  Discontinued        2 g 200 mL/hr over 30 Minutes Intravenous On call to O.R. 10/31/23 1003 10/31/23 1807   10/30/23 1530  fluconazole  (DIFLUCAN ) tablet 100 mg        100 mg Oral Daily 10/30/23 1432 11/13/23 0959   10/22/23 1045  linezolid  (ZYVOX ) tablet 600 mg        600 mg Oral Every 12 hours 10/22/23 0954     10/22/23 0600  vancomycin  (VANCOCIN ) IVPB 1000 mg/200 mL premix  Status:  Discontinued        1,000 mg 200 mL/hr over 60 Minutes Intravenous Every 12 hours 10/21/23 1738 10/21/23 1743   10/21/23 2200  piperacillin -tazobactam (ZOSYN ) IVPB 3.375 g       Placed in "Followed by" Linked Group   3.375 g 12.5 mL/hr over 240 Minutes Intravenous Every 8 hours 10/21/23 1504     10/21/23 1745  linezolid  (ZYVOX ) IVPB 600 mg  Status:  Discontinued        600 mg 300 mL/hr over 60 Minutes Intravenous Every 12 hours 10/21/23 1744 10/22/23 0954   10/21/23 1530  clindamycin  (CLEOCIN ) IVPB 600 mg  Status:  Discontinued        600 mg 100 mL/hr over 30 Minutes Intravenous  Once 10/21/23 1523 10/21/23 1715   10/21/23 1515  vancomycin  (VANCOREADY) IVPB 1750 mg/350 mL  Status:  Discontinued        1,750 mg 175 mL/hr over 120 Minutes Intravenous  Once 10/21/23 1504 10/21/23 1743   10/21/23 1515   piperacillin -tazobactam (ZOSYN ) IVPB 3.375 g       Placed in "Followed by" Linked Group   3.375 g 100 mL/hr over 30 Minutes Intravenous  Once 10/21/23 1504 10/21/23 1804        I have personally reviewed the following labs and images: CBC: Recent Labs  Lab 10/26/23 0418 10/27/23 0819 10/28/23 0535 10/28/23 1321 10/29/23 0544 10/30/23 0623 10/31/23 0525 11/01/23 0429  WBC 14.7* 12.9*   < > 12.1* 11.3* 10.1 10.4 10.9*  NEUTROABS 12.2* 10.3*  --   --   --   --   --   --   HGB 10.2* 8.7*   < > 7.7* 8.5* 8.7* 8.6* 8.6*  HCT 32.0* 27.5*   < > 24.3* 26.9* 26.6* 27.0* 26.7*  MCV 97.9 98.2   < > 98.8 95.1 94.3 94.7  95.4  PLT 367 311   < > 274 267 252 265 258   < > = values in this interval not displayed.   BMP &GFR Recent Labs  Lab 10/28/23 0535 10/29/23 0544 10/30/23 0623 10/31/23 0525 11/01/23 0429  NA 135 138 136 136 135  K 3.4* 4.4 4.0 4.0 4.0  CL 104 105 106 103 103  CO2 21* 23 22 22  19*  GLUCOSE 148* 85 82 88 167*  BUN 13 8 10 12 21   CREATININE 1.22 1.10 1.08 1.18 1.37*  CALCIUM  8.2* 8.7* 8.6* 8.5* 8.6*  MG 2.1 2.0 2.0 2.0 2.1  PHOS 3.0 3.2 3.3 3.7 2.9   Estimated Creatinine Clearance: 51.8 mL/min (A) (by C-G formula based on SCr of 1.37 mg/dL (H)). Liver & Pancreas: Recent Labs  Lab 10/28/23 0535 10/29/23 0544 10/30/23 0623 10/31/23 0525 11/01/23 0429  ALBUMIN 1.9* 2.1* 2.1* 2.2* 2.3*   No results for input(s): "LIPASE", "AMYLASE" in the last 168 hours. No results for input(s): "AMMONIA" in the last 168 hours. Diabetic: No results for input(s): "HGBA1C" in the last 72 hours. No results for input(s): "GLUCAP" in the last 168 hours. Cardiac Enzymes: No results for input(s): "CKTOTAL", "CKMB", "CKMBINDEX", "TROPONINI" in the last 168 hours. No results for input(s): "PROBNP" in the last 8760 hours. Coagulation Profile: Recent Labs  Lab 10/28/23 0535 10/29/23 0544 10/30/23 0623 10/31/23 0525 11/01/23 0429  INR 1.2 1.1 1.2 1.2 1.3*   Thyroid   Function Tests: No results for input(s): "TSH", "T4TOTAL", "FREET4", "T3FREE", "THYROIDAB" in the last 72 hours. Lipid Profile: No results for input(s): "CHOL", "HDL", "LDLCALC", "TRIG", "CHOLHDL", "LDLDIRECT" in the last 72 hours.  Anemia Panel: No results for input(s): "VITAMINB12", "FOLATE", "FERRITIN", "TIBC", "IRON", "RETICCTPCT" in the last 72 hours.  Urine analysis:    Component Value Date/Time   COLORURINE YELLOW 08/13/2014 1330   APPEARANCEUR CLEAR 08/13/2014 1330   LABSPEC 1.010 08/13/2014 1330   PHURINE 7.5 08/13/2014 1330   GLUCOSEU NEGATIVE 08/13/2014 1330   HGBUR NEGATIVE 08/13/2014 1330   BILIRUBINUR NEGATIVE 08/13/2014 1330   KETONESUR NEGATIVE 08/13/2014 1330   PROTEINUR NEGATIVE 08/13/2014 1330   UROBILINOGEN 0.2 08/13/2014 1330   NITRITE NEGATIVE 08/13/2014 1330   LEUKOCYTESUR NEGATIVE 08/13/2014 1330   Sepsis Labs: Invalid input(s): "PROCALCITONIN", "LACTICIDVEN"  Microbiology: No results found for this or any previous visit (from the past 240 hours).   Radiology Studies: No results found.     Javid Kemler T. Khilee Hendricksen [facility]  If 7PM-7AM, please contact night-coverage [email or web address] 11/01/2023, 12:16 PM

## 2023-11-01 NOTE — Progress Notes (Addendum)
 PHARMACY - ANTICOAGULATION CONSULT NOTE  Pharmacy Consult for Heparin  and warfarin  Indication:  Atrial Fibrillation , and PAD s/p vascularization Allergies  Allergen Reactions   Shellfish Allergy Nausea And Vomiting and Other (See Comments)    Shrimp, especially    Patient Measurements: Height: 5\' 10"  (177.8 cm) Weight: 80.1 kg (176 lb 9.4 oz) IBW/kg (Calculated) : 73 HEPARIN  DW (KG): 80.1  Vital Signs: Temp: 97.8 F (36.6 C) (04/24 1120) Temp Source: Oral (04/24 1120) BP: 120/62 (04/24 1120) Pulse Rate: 88 (04/24 1120)  Labs: Recent Labs    10/30/23 0623 10/31/23 0525 11/01/23 0429  HGB 8.7* 8.6* 8.6*  HCT 26.6* 27.0* 26.7*  PLT 252 265 258  LABPROT 15.2 14.9 15.9*  INR 1.2 1.2 1.3*  CREATININE 1.08 1.18 1.37*    Estimated Creatinine Clearance: 51.8 mL/min (A) (by C-G formula based on SCr of 1.37 mg/dL (H)).  Assessment: 11 yom with a history of HF, AFib on warfarin prior to admit, CAD, COPD, PAD . Patient presented 10/21/23 with toe pain, reported. 5th and 4th digit on his left foot gradually started becoming black proximately 2 weeks ago.  He was receiving Heparin  IV infusion for atrial fibrillation and limb ischemia  per pharmacy consult  while warfarin was on hold during this admission.  Heparin  stopped on 10/28/22.Jason Spencer  Holding warfarin. Warfarin was reversed  with Vitamin K  on 10/24/23 and he underwent LLE angiogram and  /p drug-coated balloon angioplasty/stent on 4/17. Then he was started on plavix  and aspirin .  Now  aspirin  discontinued after TRH discussion with vascular surgery.  Patient underwent left TMA amputation on 10/31/22.  On 11/01/23  POD #1,  Dr. Julio Ohm (ortho) notes patient had minimal petechial bleeding.   Pharmacy consulted to resume Warfarin and start IV heparin  bridge 11/01/23.  Goal INR 2-3  Today the INR is 1.3.  Heparin  level was previously therapeutic on rate of  heparin  1450 units/hr on 4/19 -10/28/23 before heparin  held on 10/28/23 for low hgb.   Acute blood loss anemia:  s/p transfustion of PRBCs on 10/28/23.  Today Hgb 8.6 stable , pltd 258k stable.    Currently taking fluconazole  and zyvox  which may have  drug- drug interaction with Warfarin, to increase effect of warfarin.  Will need to watch closely.  May need  warfarin dose adjustment during and after fluconazole  and zyvox  therapy.  Heparin  level was previously therapeutic on rate of 1450 units/hr on 4/19 -10/28/23 before heparin  held for low hgb.  Will restart heparin  drip at this rate.   - PTA Warfarin dose was 7.5 mg TTSS and 5 mg MWF - LD 3/13     Goal of Therapy:  Heparin  level 0.3-0.7 units/ml Monitor platelets by anticoagulation protocol: Yes   Plan:  Start IV heparin  infusion bridge at 1450 units/hr Check 8 hour heparin  level  Give Warfarin 5 mg today x1  (monitor for DDI w/fluconazole ) Daily INR, HL and CBC Monitor for s/sx of bleeding     Thank you for allowing pharmacy to participate in this patient's care.  Alisa Irish, RPh Clinical Pharmacist [phone] until 3:00PM  then check AMION for all [facility] Pharmacy phone numbers After 10:00 PM, call Main Pharmacy [phone]  11/01/2023 12:51 PM

## 2023-11-02 DIAGNOSIS — F411 Generalized anxiety disorder: Secondary | ICD-10-CM | POA: Diagnosis not present

## 2023-11-02 DIAGNOSIS — M869 Osteomyelitis, unspecified: Secondary | ICD-10-CM | POA: Diagnosis not present

## 2023-11-02 DIAGNOSIS — I4892 Unspecified atrial flutter: Secondary | ICD-10-CM | POA: Diagnosis not present

## 2023-11-02 DIAGNOSIS — I739 Peripheral vascular disease, unspecified: Secondary | ICD-10-CM | POA: Diagnosis not present

## 2023-11-02 LAB — CBC
HCT: 24.3 % — ABNORMAL LOW (ref 39.0–52.0)
Hemoglobin: 7.5 g/dL — ABNORMAL LOW (ref 13.0–17.0)
MCH: 30 pg (ref 26.0–34.0)
MCHC: 30.9 g/dL (ref 30.0–36.0)
MCV: 97.2 fL (ref 80.0–100.0)
Platelets: 257 10*3/uL (ref 150–400)
RBC: 2.5 MIL/uL — ABNORMAL LOW (ref 4.22–5.81)
RDW: 13.7 % (ref 11.5–15.5)
WBC: 16.3 10*3/uL — ABNORMAL HIGH (ref 4.0–10.5)
nRBC: 0 % (ref 0.0–0.2)

## 2023-11-02 LAB — RENAL FUNCTION PANEL
Albumin: 2.2 g/dL — ABNORMAL LOW (ref 3.5–5.0)
Anion gap: 9 (ref 5–15)
BUN: 26 mg/dL — ABNORMAL HIGH (ref 8–23)
CO2: 22 mmol/L (ref 22–32)
Calcium: 8 mg/dL — ABNORMAL LOW (ref 8.9–10.3)
Chloride: 104 mmol/L (ref 98–111)
Creatinine, Ser: 1.1 mg/dL (ref 0.61–1.24)
GFR, Estimated: >60 mL/min (ref >60)
Glucose, Bld: 102 mg/dL — ABNORMAL HIGH (ref 70–99)
Phosphorus: 3.1 mg/dL (ref 2.5–4.6)
Potassium: 3.9 mmol/L (ref 3.5–5.1)
Sodium: 135 mmol/L (ref 135–145)

## 2023-11-02 LAB — PROTIME-INR
INR: 1.2 (ref 0.8–1.2)
Prothrombin Time: 15 s (ref 11.4–15.2)

## 2023-11-02 LAB — HEPARIN LEVEL (UNFRACTIONATED): Heparin Unfractionated: 0.63 [IU]/mL (ref 0.30–0.70)

## 2023-11-02 LAB — MAGNESIUM: Magnesium: 2 mg/dL (ref 1.7–2.4)

## 2023-11-02 MED ORDER — ASPIRIN 81 MG PO TBEC
81.0000 mg | DELAYED_RELEASE_TABLET | Freq: Every day | ORAL | Status: DC
  Administered 2023-11-02 – 2023-11-06 (×5): 81 mg via ORAL
  Filled 2023-11-02 (×5): qty 1

## 2023-11-02 NOTE — Progress Notes (Signed)
 PROGRESS NOTE  Jason Spencer ZOX:[id number] DOB: 21-Nov-1950   PCP: Yevette Hem, FNP  Patient is from: Home  DOA: 10/21/2023 LOS: 12  Chief complaints Chief Complaint  Patient presents with   Circulatory Problem     Brief Narrative / Interim history: 73 year old M with PMH of COPD, CAD, atrial fibrillation/flutter on warfarin, HTN, HLD, EtOH use, anxiety, pleural effusion, psoriasis, PAD and tobacco use disorder presenting with progressive left 4th and 5th toe pain and discoloration for about 2 weeks, and admitted with critical limb ischemia with left 4th and 5th toe gangrene, and fifth metatarsal head osteomyelitis.   He had mild leukocytosis with elevated CRP to 9.9.  Has no fever.  Pro-Cal negative.  Foot x-ray showed subcutaneous gas about the head of the fifth metatarsal and proximal phalanx of left fifth toe with cortical loss medially from the fifth metatarsal head consistent with osteomyelitis.  Blood cultures obtained.  Patient was started on broad-spectrum antibiotics.  Orthopedic surgery and vascular surgery consulted.   CT angio showed severe bilateral multifocal high-grade stenosis from femoral arteries down, and aortic atherosclerosis.    INR reversed with vitamin K and he underwent LLE angiogram with shockwave lithotripsy, drug-coated balloon angioplasty and stent to left SFA and above knee left popliteal artery on 4/17.  Started on Plavix  and aspirin .  Remains on broad-spectrum antibiotics.  Plan for transmetatarsal amputation on Wednesday.  Hgb dropped about 4 g dropped without overt bleeding.  Heparin  discontinued.  GI consulted.  EGD showed esophageal plaque concerning for candidiasis (biopsied), large grade B reflux esophagitis, gastritis (biopsied) and normal duodenum.  GI recommended p.o. Protonix  40 mg twice daily, continuing antiplatelet and anticoagulation and Diflucan .  CT abdomen and pelvis demonstrated multiple small hematomas in right inguinal region with  surrounding stranding and fluid felt to be patient source of blood loss anemia.  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  Anticoagulation resumed with warfarin and heparin  bridge on 4/24 but patient had bleeding from his right TMA.  Anticoagulation discontinued on 4/25  Subjective: Seen and examined earlier this morning.  Bleeding from right TMA.  Hemoglobin dropped about 1 g.  Anticoagulation discontinued this morning  Objective: Vitals:   11/01/23 1933 11/02/23 0526 11/02/23 0728 11/02/23 0754  BP: (!) 112/97 108/75 120/71   Pulse: 78 72 82   Resp: 18 20 17    Temp: (!) 97.5 F (36.4 C) 98 F (36.7 C) 97.6 F (36.4 C)   TempSrc: Oral  Oral   SpO2: 99% 98% 98% 99%  Weight:      Height:        Examination:  GENERAL: No apparent distress.  Nontoxic. HEENT: MMM.  Vision and hearing grossly intact.  NECK: Supple.  No apparent JVD.  RESP:  No IWOB.  Fair aeration bilaterally. CVS:  RRR. Heart sounds normal.  ABD/GI/GU: BS+. Abd soft, NTND.  MSK/EXT:  Moves extremities.  Bleeding from right TMA through dressing. SKIN: Bruising in right groin area.  No visible or palpable hematoma. NEURO: Awake, alert and oriented appropriately.  No apparent focal neuro deficit. PSYCH: Calm. Normal affect.   Consultants:  Orthopedic surgery Vascular surgery Gastroenterology  Procedures: 4/17-LLE angiogram with shockwave lithotripsy, drug-coated balloon angioplasty and stent to left SFA and above knee left popliteal artery.  See op note for details. 4/22-EGD showed esophageal plaque concerning for candidiasis (biopsied), large grade B reflux esophagitis, gastritis (biopsied) and normal duodenum.  Microbiology summarized: MRSA PCR screen nonreactive Blood cultures NGTD  Assessment and plan: Left  lower extremity critical limb ischemia with 4th and 5th dry gangrene: Present on arrival Severe bilateral PAD-as noted on CT angio Left foot osteomyelitis: Present on arrival -Left foot  x-ray and CT angio as above. -4/17-LLE angiogram with shockwave lithotripsy, drug-coated balloon angioplasty and stent to left SFA and above knee left popliteal artery by Dr. Fulton Job  -4/23-left TMT amputation by Dr. Julio Ohm. -4/24 vascular: Plavix  for 1 month followed by low-dose aspirin  since patient is on anticoagulation -4/25 bleeding from TMT amputation.  Discontinued heparin  and warfarin.  Added back aspirin  -Antibiotics discontinued 24 hours postop per orthopedic surgery. -Per Ortho, antibiotics for 24 hours postop, NWB, dry dressing and outpatient follow-up in 1 week -Continue home Crestor .  LDL only 48. -Emphasized the importance of smoking cessation -Plan for transmetatarsal amputation on Wednesday -PT/OT-recommended SNF   Chronic atrial fibrillation/flutter: Rate controlled.  On warfarin at home.  INR subtherapeutic after reversal for surgery.  -Consider home metoprolol  -Resume warfarin with heparin  bridge -Optimize electrolytes - Monitor INR  AKI: Baseline Cr~1.0. Could be due to IV contrast, IV antibiotics and lisinopril .  Resolved. Recent Labs    10/25/23 0424 10/25/23 1951 10/26/23 0418 10/27/23 3474 10/28/23 0535 10/29/23 0544 10/30/23 2595 10/31/23 0525 11/01/23 0429 11/02/23 0654  BUN 11 13 15 13 13 8 10 12 21  26*  CREATININE 1.18 1.40* 1.49* 1.29* 1.22 1.10 1.08 1.18 1.37* 1.10  - Continue holding lisinopril  - Avoid nephrotoxic meds - Continue monitoring  ABLA due to right inguinal region hematoma superimposed on anemia of chronic disease: Hgb dropped about 4 g after restarting anticoagulation with heparin .  CT abdomen and pelvis showed multiple small hematomas in right inguinal region.  Also noted some dark stool on 4/19.  Anemia panel with vitamin B12 deficiency.  Transfused 1 unit on 4/20.  Hgb to be dropped again due to bleeding from TMA Recent Labs    10/25/23 1951 10/26/23 0418 10/27/23 0819 10/28/23 0535 10/28/23 1321 10/29/23 0544 10/30/23 0623  10/31/23 0525 11/01/23 0429 11/02/23 0654  HGB 11.6* 10.2* 8.7* 7.4* 7.7* 8.5* 8.7* 8.6* 8.6* 7.5*  -Hold anticoagulation. -Continue p.o. Protonix  40 mg twice daily per GI -Received vitamin B12 injection 1000 mcg for 2 days.  Continue p.o. -Monitor H&H.  Transfuse for Hgb <8.0 given history of CAD  Esophageal plaques/gastritis/esophagitis - GI recommended p.o. Protonix  40 mg twice daily - Follow biopsy from EGD  History of CAD: No cardiopulmonary symptoms. -Continue home metoprolol  and Crestor  -Continue Plavix  and aspirin  as above.  Psoriasis: Did not follow-up with his dermatologist for a while -Continue clobetasol  cream twice daily   Essential hypertension: Normotensive for most part -Continue home amlodipine  and metoprolol  -Continue holding lisinopril   Chronic COPD: Stable -Continue home inhalers or hospital formulary -Encouraged smoking cessation  Alcohol abuse: No withdrawal symptoms.  CIWA 0. - Multivitamin, thiamine  and folic acid   Hypokalemia - Replenish and recheck as appropriate  Leukocytosis: Improved. -Continue monitoring  Body mass index is 25.34 kg/m.           DVT prophylaxis:  SCD's Start: 10/31/23 1738  Code Status: Full code Family Communication: None at bedside Level of care: Med-Surg Status is: Inpatient Remains inpatient appropriate because: Critical limb ischemia, osteomyelitis and acute blood loss anemia   Final disposition: SNF   55 minutes with more than 50% spent in reviewing records, counseling patient/family and coordinating care.   Sch Meds:  Scheduled Meds:  amLODipine   5 mg Oral Daily   vitamin C   1,000 mg Oral Daily  aspirin  EC  81 mg Oral Daily   budeson-glycopyrrolate -formoterol   2 puff Inhalation BID   clobetasol  cream   Topical BID   clopidogrel   75 mg Oral Daily   vitamin B-12  1,000 mcg Oral Daily   fluconazole   100 mg Oral Daily   folic acid   1 mg Oral Daily   metoprolol  succinate  100 mg Oral Daily    multivitamin with minerals  1 tablet Oral Daily   nutrition supplement (JUVEN)  1 packet Oral BID BM   pantoprazole   40 mg Oral BID   rosuvastatin   20 mg Oral Daily   sodium chloride  flush  3 mL Intravenous Q12H   sodium chloride  flush  3 mL Intravenous Q12H   thiamine   100 mg Oral Daily   zinc  sulfate (50mg  elemental zinc )  220 mg Oral Daily   Continuous Infusions:  sodium chloride      PRN Meds:.acetaminophen  **OR** acetaminophen , acetaminophen , hydrALAZINE , HYDROcodone -acetaminophen , labetalol , morphine  injection, polyethylene glycol, sodium chloride  flush  Antimicrobials: Anti-infectives (From admission, onward)    Start     Dose/Rate Route Frequency Ordered Stop   11/01/23 2200  linezolid  (ZYVOX ) tablet 600 mg        600 mg Oral Every 12 hours 11/01/23 1223 11/01/23 2202   10/31/23 1100  ceFAZolin  (ANCEF ) IVPB 2g/100 mL premix  Status:  Discontinued        2 g 200 mL/hr over 30 Minutes Intravenous On call to O.R. 10/31/23 1003 10/31/23 1807   10/30/23 1530  fluconazole  (DIFLUCAN ) tablet 100 mg        100 mg Oral Daily 10/30/23 1432 11/13/23 0959   10/22/23 1045  linezolid  (ZYVOX ) tablet 600 mg  Status:  Discontinued        600 mg Oral Every 12 hours 10/22/23 0954 11/01/23 1223   10/22/23 0600  vancomycin  (VANCOCIN ) IVPB 1000 mg/200 mL premix  Status:  Discontinued        1,000 mg 200 mL/hr over 60 Minutes Intravenous Every 12 hours 10/21/23 1738 10/21/23 1743   10/21/23 2200  piperacillin -tazobactam (ZOSYN ) IVPB 3.375 g       Placed in "Followed by" Linked Group   3.375 g 12.5 mL/hr over 240 Minutes Intravenous Every 8 hours 10/21/23 1504 11/01/23 2359   10/21/23 1745  linezolid  (ZYVOX ) IVPB 600 mg  Status:  Discontinued        600 mg 300 mL/hr over 60 Minutes Intravenous Every 12 hours 10/21/23 1744 10/22/23 0954   10/21/23 1530  clindamycin  (CLEOCIN ) IVPB 600 mg  Status:  Discontinued        600 mg 100 mL/hr over 30 Minutes Intravenous  Once 10/21/23 1523 10/21/23 1715    10/21/23 1515  vancomycin  (VANCOREADY) IVPB 1750 mg/350 mL  Status:  Discontinued        1,750 mg 175 mL/hr over 120 Minutes Intravenous  Once 10/21/23 1504 10/21/23 1743   10/21/23 1515  piperacillin -tazobactam (ZOSYN ) IVPB 3.375 g       Placed in "Followed by" Linked Group   3.375 g 100 mL/hr over 30 Minutes Intravenous  Once 10/21/23 1504 10/21/23 1804        I have personally reviewed the following labs and images: CBC: Recent Labs  Lab 10/27/23 0819 10/28/23 0535 10/29/23 0544 10/30/23 0623 10/31/23 0525 11/01/23 0429 11/02/23 0654  WBC 12.9*   < > 11.3* 10.1 10.4 10.9* 16.3*  NEUTROABS 10.3*  --   --   --   --   --   --  HGB 8.7*   < > 8.5* 8.7* 8.6* 8.6* 7.5*  HCT 27.5*   < > 26.9* 26.6* 27.0* 26.7* 24.3*  MCV 98.2   < > 95.1 94.3 94.7 95.4 97.2  PLT 311   < > 267 252 265 258 257   < > = values in this interval not displayed.   BMP &GFR Recent Labs  Lab 10/29/23 0544 10/30/23 0623 10/31/23 0525 11/01/23 0429 11/02/23 0654  NA 138 136 136 135 135  K 4.4 4.0 4.0 4.0 3.9  CL 105 106 103 103 104  CO2 23 22 22  19* 22  GLUCOSE 85 82 88 167* 102*  BUN 8 10 12 21  26*  CREATININE 1.10 1.08 1.18 1.37* 1.10  CALCIUM  8.7* 8.6* 8.5* 8.6* 8.0*  MG 2.0 2.0 2.0 2.1 2.0  PHOS 3.2 3.3 3.7 2.9 3.1   Estimated Creatinine Clearance: 64.5 mL/min (by C-G formula based on SCr of 1.1 mg/dL). Liver & Pancreas: Recent Labs  Lab 10/29/23 0544 10/30/23 8119 10/31/23 0525 11/01/23 0429 11/02/23 0654  ALBUMIN 2.1* 2.1* 2.2* 2.3* 2.2*   No results for input(s): "LIPASE", "AMYLASE" in the last 168 hours. No results for input(s): "AMMONIA" in the last 168 hours. Diabetic: No results for input(s): "HGBA1C" in the last 72 hours. No results for input(s): "GLUCAP" in the last 168 hours. Cardiac Enzymes: No results for input(s): "CKTOTAL", "CKMB", "CKMBINDEX", "TROPONINI" in the last 168 hours. No results for input(s): "PROBNP" in the last 8760 hours. Coagulation  Profile: Recent Labs  Lab 10/29/23 0544 10/30/23 0623 10/31/23 0525 11/01/23 0429 11/02/23 0654  INR 1.1 1.2 1.2 1.3* 1.2   Thyroid  Function Tests: No results for input(s): "TSH", "T4TOTAL", "FREET4", "T3FREE", "THYROIDAB" in the last 72 hours. Lipid Profile: No results for input(s): "CHOL", "HDL", "LDLCALC", "TRIG", "CHOLHDL", "LDLDIRECT" in the last 72 hours.  Anemia Panel: No results for input(s): "VITAMINB12", "FOLATE", "FERRITIN", "TIBC", "IRON", "RETICCTPCT" in the last 72 hours.  Urine analysis:    Component Value Date/Time   COLORURINE YELLOW 08/13/2014 1330   APPEARANCEUR CLEAR 08/13/2014 1330   LABSPEC 1.010 08/13/2014 1330   PHURINE 7.5 08/13/2014 1330   GLUCOSEU NEGATIVE 08/13/2014 1330   HGBUR NEGATIVE 08/13/2014 1330   BILIRUBINUR NEGATIVE 08/13/2014 1330   KETONESUR NEGATIVE 08/13/2014 1330   PROTEINUR NEGATIVE 08/13/2014 1330   UROBILINOGEN 0.2 08/13/2014 1330   NITRITE NEGATIVE 08/13/2014 1330   LEUKOCYTESUR NEGATIVE 08/13/2014 1330   Sepsis Labs: Invalid input(s): "PROCALCITONIN", "LACTICIDVEN"  Microbiology: No results found for this or any previous visit (from the past 240 hours).   Radiology Studies: No results found.     Yoselyn Mcglade T. Abbegale Stehle [facility]  If 7PM-7AM, please contact night-coverage [email or web address] 11/02/2023, 10:49 AM

## 2023-11-02 NOTE — Plan of Care (Signed)
  Problem: Education: Goal: Knowledge of General Education information will improve Description: Including pain rating scale, medication(s)/side effects and non-pharmacologic comfort measures Outcome: Progressing   Problem: Health Behavior/Discharge Planning: Goal: Ability to manage health-related needs will improve Outcome: Progressing   Problem: Clinical Measurements: Goal: Ability to maintain clinical measurements within normal limits will improve Outcome: Progressing Goal: Will remain free from infection Outcome: Progressing Goal: Diagnostic test results will improve Outcome: Progressing Goal: Respiratory complications will improve Outcome: Progressing Goal: Cardiovascular complication will be avoided Outcome: Progressing   Problem: Activity: Goal: Risk for activity intolerance will decrease Outcome: Progressing   Problem: Nutrition: Goal: Adequate nutrition will be maintained Outcome: Progressing   Problem: Coping: Goal: Level of anxiety will decrease Outcome: Progressing   Problem: Elimination: Goal: Will not experience complications related to bowel motility Outcome: Progressing Goal: Will not experience complications related to urinary retention Outcome: Progressing   Problem: Pain Managment: Goal: General experience of comfort will improve and/or be controlled Outcome: Progressing   Problem: Safety: Goal: Ability to remain free from injury will improve Outcome: Progressing   Problem: Skin Integrity: Goal: Risk for impaired skin integrity will decrease Outcome: Progressing   Problem: Education: Goal: Understanding of CV disease, CV risk reduction, and recovery process will improve Outcome: Progressing Goal: Individualized Educational Video(s) Outcome: Progressing   Problem: Activity: Goal: Ability to return to baseline activity level will improve Outcome: Progressing   Problem: Cardiovascular: Goal: Ability to achieve and maintain adequate  cardiovascular perfusion will improve Outcome: Progressing Goal: Vascular access site(s) Level 0-1 will be maintained Outcome: Progressing   Problem: Health Behavior/Discharge Planning: Goal: Ability to safely manage health-related needs after discharge will improve Outcome: Progressing   Problem: Education: Goal: Knowledge of the prescribed therapeutic regimen will improve Outcome: Progressing Goal: Ability to verbalize activity precautions or restrictions will improve Outcome: Progressing Goal: Understanding of discharge needs will improve Outcome: Progressing   Problem: Activity: Goal: Ability to perform//tolerate increased activity and mobilize with assistive devices will improve Outcome: Progressing   Problem: Clinical Measurements: Goal: Postoperative complications will be avoided or minimized Outcome: Progressing   Problem: Self-Care: Goal: Ability to meet self-care needs will improve Outcome: Progressing   Problem: Self-Concept: Goal: Ability to maintain and perform role responsibilities to the fullest extent possible will improve Outcome: Progressing   Problem: Pain Management: Goal: Pain level will decrease with appropriate interventions Outcome: Progressing   Problem: Skin Integrity: Goal: Demonstration of wound healing without infection will improve Outcome: Progressing

## 2023-11-02 NOTE — Progress Notes (Signed)
 I was concerned about pts foot bleeding through dressing   his response was.Jason AasAaron AasThat's actually good. There was no bleeding at surgery. Please remove surgical dressing apply dry dressing and an ace wrap. Leave the adaptic in place

## 2023-11-03 DIAGNOSIS — M869 Osteomyelitis, unspecified: Secondary | ICD-10-CM | POA: Diagnosis not present

## 2023-11-03 DIAGNOSIS — E785 Hyperlipidemia, unspecified: Secondary | ICD-10-CM | POA: Diagnosis not present

## 2023-11-03 DIAGNOSIS — Z87898 Personal history of other specified conditions: Secondary | ICD-10-CM | POA: Diagnosis not present

## 2023-11-03 DIAGNOSIS — J42 Unspecified chronic bronchitis: Secondary | ICD-10-CM | POA: Diagnosis not present

## 2023-11-03 LAB — MAGNESIUM: Magnesium: 1.9 mg/dL (ref 1.7–2.4)

## 2023-11-03 LAB — RENAL FUNCTION PANEL
Albumin: 2.2 g/dL — ABNORMAL LOW (ref 3.5–5.0)
Anion gap: 7 (ref 5–15)
BUN: 27 mg/dL — ABNORMAL HIGH (ref 8–23)
CO2: 24 mmol/L (ref 22–32)
Calcium: 8.4 mg/dL — ABNORMAL LOW (ref 8.9–10.3)
Chloride: 106 mmol/L (ref 98–111)
Creatinine, Ser: 1.09 mg/dL (ref 0.61–1.24)
GFR, Estimated: >60 mL/min (ref >60)
Glucose, Bld: 87 mg/dL (ref 70–99)
Phosphorus: 2.5 mg/dL (ref 2.5–4.6)
Potassium: 3.9 mmol/L (ref 3.5–5.1)
Sodium: 137 mmol/L (ref 135–145)

## 2023-11-03 LAB — HEMOGLOBIN AND HEMATOCRIT, BLOOD
HCT: 27.8 % — ABNORMAL LOW (ref 39.0–52.0)
Hemoglobin: 9 g/dL — ABNORMAL LOW (ref 13.0–17.0)

## 2023-11-03 LAB — PREPARE RBC (CROSSMATCH)

## 2023-11-03 LAB — CBC
HCT: 23.8 % — ABNORMAL LOW (ref 39.0–52.0)
Hemoglobin: 7.5 g/dL — ABNORMAL LOW (ref 13.0–17.0)
MCH: 29.9 pg (ref 26.0–34.0)
MCHC: 31.5 g/dL (ref 30.0–36.0)
MCV: 94.8 fL (ref 80.0–100.0)
Platelets: 246 10*3/uL (ref 150–400)
RBC: 2.51 MIL/uL — ABNORMAL LOW (ref 4.22–5.81)
RDW: 13.5 % (ref 11.5–15.5)
WBC: 10.5 10*3/uL (ref 4.0–10.5)
nRBC: 0 % (ref 0.0–0.2)

## 2023-11-03 MED ORDER — SODIUM CHLORIDE 0.9% IV SOLUTION: Freq: Once | INTRAVENOUS | Status: DC

## 2023-11-03 NOTE — Plan of Care (Signed)
  Problem: Education: Goal: Knowledge of General Education information will improve Description: Including pain rating scale, medication(s)/side effects and non-pharmacologic comfort measures Outcome: Progressing   Problem: Health Behavior/Discharge Planning: Goal: Ability to manage health-related needs will improve Outcome: Progressing   Problem: Clinical Measurements: Goal: Ability to maintain clinical measurements within normal limits will improve Outcome: Progressing Goal: Will remain free from infection Outcome: Progressing Goal: Diagnostic test results will improve Outcome: Progressing Goal: Respiratory complications will improve Outcome: Progressing Goal: Cardiovascular complication will be avoided Outcome: Progressing   Problem: Activity: Goal: Risk for activity intolerance will decrease Outcome: Progressing   Problem: Nutrition: Goal: Adequate nutrition will be maintained Outcome: Progressing   Problem: Coping: Goal: Level of anxiety will decrease Outcome: Progressing   Problem: Elimination: Goal: Will not experience complications related to bowel motility Outcome: Progressing Goal: Will not experience complications related to urinary retention Outcome: Progressing   Problem: Pain Managment: Goal: General experience of comfort will improve and/or be controlled Outcome: Progressing   Problem: Safety: Goal: Ability to remain free from injury will improve Outcome: Progressing   Problem: Skin Integrity: Goal: Risk for impaired skin integrity will decrease Outcome: Progressing   Problem: Education: Goal: Understanding of CV disease, CV risk reduction, and recovery process will improve Outcome: Progressing Goal: Individualized Educational Video(s) Outcome: Progressing   Problem: Activity: Goal: Ability to return to baseline activity level will improve Outcome: Progressing   Problem: Cardiovascular: Goal: Ability to achieve and maintain adequate  cardiovascular perfusion will improve Outcome: Progressing Goal: Vascular access site(s) Level 0-1 will be maintained Outcome: Progressing   Problem: Health Behavior/Discharge Planning: Goal: Ability to safely manage health-related needs after discharge will improve Outcome: Progressing   Problem: Education: Goal: Knowledge of the prescribed therapeutic regimen will improve Outcome: Progressing Goal: Ability to verbalize activity precautions or restrictions will improve Outcome: Progressing Goal: Understanding of discharge needs will improve Outcome: Progressing   Problem: Activity: Goal: Ability to perform//tolerate increased activity and mobilize with assistive devices will improve Outcome: Progressing   Problem: Clinical Measurements: Goal: Postoperative complications will be avoided or minimized Outcome: Progressing   Problem: Self-Care: Goal: Ability to meet self-care needs will improve Outcome: Progressing   Problem: Self-Concept: Goal: Ability to maintain and perform role responsibilities to the fullest extent possible will improve Outcome: Progressing   Problem: Pain Management: Goal: Pain level will decrease with appropriate interventions Outcome: Progressing   Problem: Skin Integrity: Goal: Demonstration of wound healing without infection will improve Outcome: Progressing

## 2023-11-03 NOTE — Hospital Course (Signed)
 73 year old M with PMH of COPD, CAD, atrial fibrillation/flutter on warfarin, HTN, HLD, EtOH use, anxiety, pleural effusion, psoriasis, PAD and tobacco use disorder presenting with progressive left 4th and 5th toe pain and discoloration for about 2 weeks, and admitted with critical limb ischemia with left 4th and 5th toe gangrene, and fifth metatarsal head osteomyelitis.   He had mild leukocytosis with elevated CRP to 9.9.  Has no fever.  Pro-Cal negative.  Foot x-ray showed subcutaneous gas about the head of the fifth metatarsal and proximal phalanx of left fifth toe with cortical loss medially from the fifth metatarsal head consistent with osteomyelitis.  Blood cultures obtained.  Patient was started on broad-spectrum antibiotics.  Orthopedic surgery and vascular surgery consulted.   CT angio showed severe bilateral multifocal high-grade stenosis from femoral arteries down, and aortic atherosclerosis.    INR reversed with vitamin K and he underwent LLE angiogram with shockwave lithotripsy, drug-coated balloon angioplasty and stent to left SFA and above knee left popliteal artery on 4/17.  Started on Plavix  and aspirin .  Remains on broad-spectrum antibiotics.  Plan for transmetatarsal amputation on Wednesday.  Hgb dropped about 4 g dropped without overt bleeding.  Heparin  discontinued.  GI consulted.  EGD showed esophageal plaque concerning for candidiasis (biopsied), large grade B reflux esophagitis, gastritis (biopsied) and normal duodenum.  GI recommended p.o. Protonix  40 mg twice daily, continuing antiplatelet and anticoagulation and Diflucan .  CT abdomen and pelvis demonstrated multiple small hematomas in right inguinal region with surrounding stranding and fluid felt to be patient source of blood loss anemia.  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  Anticoagulation resumed with warfarin and heparin  bridge on 4/24 but patient had bleeding from his right TMA.  Anticoagulation discontinued on  4/25

## 2023-11-03 NOTE — Progress Notes (Signed)
 PROGRESS NOTE  Jason Spencer ZOX:[id number] DOB: [birth year]   PCP: Yevette Hem, FNP  Patient is from: Home  DOA: 10/21/2023 LOS: 13  Chief complaints Chief Complaint  Patient presents with   Circulatory Problem     Brief Narrative / Interim history: 73 year old M with PMH of COPD, CAD, atrial fibrillation/flutter on warfarin, HTN, HLD, EtOH use, anxiety, pleural effusion, psoriasis, PAD and tobacco use disorder presenting with progressive left 4th and 5th toe pain and discoloration for about 2 weeks, and admitted with critical limb ischemia with left 4th and 5th toe gangrene, and fifth metatarsal head osteomyelitis.   Patient was started on broad-spectrum antibiotics.  Underwent left lower extremity revascularization by vascular surgery on 4/17.  Underwent left foot TMA on 4/23.  Hospital course complicated by acute blood loss anemia due to hematoma from angiogram and bleeding from left AMA.  Transfused blood.  Anticoagulation held.  Currently stable.  Recommended SNF.  See hospital course for more detail   Subjective: Seen and examined earlier this morning.  Bleeding from right TMA.  Hemoglobin dropped about 1 g.  Anticoagulation discontinued this morning  Objective: Vitals:   11/02/23 1911 11/03/23 0417 11/03/23 0728 11/03/23 0747  BP: (!) 114/58 118/64  111/78  Pulse: 84 83  83  Resp: 18 18  18   Temp: 97.8 F (36.6 C) 98.2 F (36.8 C)  97.6 F (36.4 C)  TempSrc: Oral Oral  Oral  SpO2: 98% 97% 100% 100%  Weight:      Height:        Examination:  GENERAL: No apparent distress.  Nontoxic. HEENT: MMM.  Vision and hearing grossly intact.  NECK: Supple.  No apparent JVD.  RESP:  No IWOB.  Fair aeration bilaterally. CVS:  RRR. Heart sounds normal.  ABD/GI/GU: BS+. Abd soft, NTND.  MSK/EXT:  Moves extremities.  Bleeding from right TMA through dressing. SKIN: Bruising in right groin area.  No visible or palpable hematoma. NEURO: Awake, alert and oriented  appropriately.  No apparent focal neuro deficit. PSYCH: Calm. Normal affect.   Consultants:  Orthopedic surgery Vascular surgery Gastroenterology  Procedures: 4/17-LLE angiogram with shockwave lithotripsy, drug-coated balloon angioplasty and stent to left SFA and above knee left popliteal artery.  See op note for details. 4/22-EGD showed esophageal plaque concerning for candidiasis (biopsied), large grade B reflux esophagitis, gastritis (biopsied) and normal duodenum.  Microbiology summarized: MRSA PCR screen nonreactive Blood cultures NGTD  Assessment and plan: Left lower extremity critical limb ischemia with 4th and 5th dry gangrene: Present on arrival Severe bilateral PAD-as noted on CT angio Left foot osteomyelitis: Present on arrival -Left foot x-ray and CT angio as above. -4/17-LLE angiogram with shockwave lithotripsy, drug-coated balloon angioplasty and stent to left SFA and above knee left popliteal artery by Dr. Fulton Job  -4/23-left TMT amputation by Dr. Julio Ohm. -4/24 vascular: Plavix  for 1 month followed by low-dose aspirin  since patient is on anticoagulation -4/25 bleeding from TMT amputation.  Discontinued heparin  and warfarin.  Added back aspirin  on 4/25 -Antibiotics discontinued 24 hours postop per orthopedic surgery. -Per Ortho, antibiotics for 24 hours postop, NWB, dry dressing and outpatient follow-up in 1 week -Continue home Crestor .  LDL only 48. -Emphasized the importance of smoking cessation -PT/OT-recommended SNF   Chronic atrial fibrillation/flutter: Rate controlled.  On warfarin at home.  INR subtherapeutic after reversal for surgery.  -Consider home metoprolol  -May resume anticoagulation in the next 2 to 3 days if no further bleeding from TMA. -Optimize electrolytes  AKI: Baseline Cr~1.0.  Could be due to IV contrast, IV antibiotics and lisinopril .  Resolved. Recent Labs    10/25/23 1951 10/26/23 0418 10/27/23 0819 10/28/23 0535 10/29/23 0544  10/30/23 8119 10/31/23 0525 11/01/23 0429 11/02/23 0654 11/03/23 0723  BUN 13 15 13 13 8 10 12 21  26* 27*  CREATININE 1.40* 1.49* 1.29* 1.22 1.10 1.08 1.18 1.37* 1.10 1.09  - Continue holding lisinopril  - Avoid nephrotoxic meds - Continue monitoring  ABLA due to right inguinal region hematoma superimposed on anemia of chronic disease: Hgb dropped about 4 g after restarting anticoagulation with heparin .  CT abdomen and pelvis showed multiple small hematomas in right inguinal region.  Also noted some dark stool on 4/19.  Anemia panel with vitamin B12 deficiency.  Transfused 1 unit on 4/20.  Hgb to be dropped again due to bleeding from TMA Recent Labs    10/26/23 0418 10/27/23 0819 10/28/23 0535 10/28/23 1321 10/29/23 0544 10/30/23 0623 10/31/23 0525 11/01/23 0429 11/02/23 0654 11/03/23 0723  HGB 10.2* 8.7* 7.4* 7.7* 8.5* 8.7* 8.6* 8.6* 7.5* 7.5*  -Hold anticoagulation. -Transfuse 1 unit. -Continue p.o. Protonix  40 mg twice daily per GI -Received vitamin B12 injection 1000 mcg for 2 days.  Continue p.o. -Monitor H&H.  Transfuse for Hgb <8.0 given history of CAD  Esophageal plaques/gastritis/esophagitis: Pathology from EGD suggested esophagitis/gastritis. - GI recommended Diflucan  and p.o. Protonix  40 mg twice daily  History of CAD: No cardiopulmonary symptoms. -Continue home metoprolol  and Crestor  -Continue Plavix  and aspirin  as above.  Psoriasis: Did not follow-up with his dermatologist for a while -Continue clobetasol  cream twice daily   Essential hypertension: Normotensive for most part -Continue home amlodipine  and metoprolol  -Continue holding lisinopril   Chronic COPD: Stable -Continue home inhalers or hospital formulary -Encouraged smoking cessation  Alcohol abuse: No withdrawal symptoms.  CIWA 0. - Multivitamin, thiamine  and folic acid   Hypokalemia - Replenish and recheck as appropriate  Leukocytosis: Resolved.  Body mass index is 25.34 kg/m.            DVT prophylaxis:  SCD's Start: 10/31/23 1738  Code Status: Full code Family Communication: None at bedside Level of care: Med-Surg Status is: Inpatient Remains inpatient appropriate because: Needs placement.   Final disposition: SNF   35 minutes with more than 50% spent in reviewing records, counseling patient/family and coordinating care.   Sch Meds:  Scheduled Meds:  sodium chloride    Intravenous Once   amLODipine   5 mg Oral Daily   vitamin C   1,000 mg Oral Daily   aspirin  EC  81 mg Oral Daily   budeson-glycopyrrolate -formoterol   2 puff Inhalation BID   clobetasol  cream   Topical BID   clopidogrel   75 mg Oral Daily   vitamin B-12  1,000 mcg Oral Daily   fluconazole   100 mg Oral Daily   folic acid   1 mg Oral Daily   metoprolol  succinate  100 mg Oral Daily   multivitamin with minerals  1 tablet Oral Daily   nutrition supplement (JUVEN)  1 packet Oral BID BM   pantoprazole   40 mg Oral BID   rosuvastatin   20 mg Oral Daily   sodium chloride  flush  3 mL Intravenous Q12H   sodium chloride  flush  3 mL Intravenous Q12H   thiamine   100 mg Oral Daily   zinc  sulfate (50mg  elemental zinc )  220 mg Oral Daily   Continuous Infusions:  sodium chloride      PRN Meds:.acetaminophen  **OR** acetaminophen , acetaminophen , hydrALAZINE , HYDROcodone -acetaminophen , labetalol , morphine  injection, polyethylene glycol, sodium chloride  flush  Antimicrobials:  Anti-infectives (From admission, onward)    Start     Dose/Rate Route Frequency Ordered Stop   11/01/23 2200  linezolid  (ZYVOX ) tablet 600 mg        600 mg Oral Every 12 hours 11/01/23 1223 11/01/23 2202   10/31/23 1100  ceFAZolin  (ANCEF ) IVPB 2g/100 mL premix  Status:  Discontinued        2 g 200 mL/hr over 30 Minutes Intravenous On call to O.R. 10/31/23 1003 10/31/23 1807   10/30/23 1530  fluconazole  (DIFLUCAN ) tablet 100 mg        100 mg Oral Daily 10/30/23 1432 11/13/23 0959   10/22/23 1045  linezolid  (ZYVOX ) tablet 600 mg   Status:  Discontinued        600 mg Oral Every 12 hours 10/22/23 0954 11/01/23 1223   10/22/23 0600  vancomycin  (VANCOCIN ) IVPB 1000 mg/200 mL premix  Status:  Discontinued        1,000 mg 200 mL/hr over 60 Minutes Intravenous Every 12 hours 10/21/23 1738 10/21/23 1743   10/21/23 2200  piperacillin -tazobactam (ZOSYN ) IVPB 3.375 g       Placed in "Followed by" Linked Group   3.375 g 12.5 mL/hr over 240 Minutes Intravenous Every 8 hours 10/21/23 1504 11/01/23 2359   10/21/23 1745  linezolid  (ZYVOX ) IVPB 600 mg  Status:  Discontinued        600 mg 300 mL/hr over 60 Minutes Intravenous Every 12 hours 10/21/23 1744 10/22/23 0954   10/21/23 1530  clindamycin  (CLEOCIN ) IVPB 600 mg  Status:  Discontinued        600 mg 100 mL/hr over 30 Minutes Intravenous  Once 10/21/23 1523 10/21/23 1715   10/21/23 1515  vancomycin  (VANCOREADY) IVPB 1750 mg/350 mL  Status:  Discontinued        1,750 mg 175 mL/hr over 120 Minutes Intravenous  Once 10/21/23 1504 10/21/23 1743   10/21/23 1515  piperacillin -tazobactam (ZOSYN ) IVPB 3.375 g       Placed in "Followed by" Linked Group   3.375 g 100 mL/hr over 30 Minutes Intravenous  Once 10/21/23 1504 10/21/23 1804        I have personally reviewed the following labs and images: CBC: Recent Labs  Lab 10/30/23 0623 10/31/23 0525 11/01/23 0429 11/02/23 0654 11/03/23 0723  WBC 10.1 10.4 10.9* 16.3* 10.5  HGB 8.7* 8.6* 8.6* 7.5* 7.5*  HCT 26.6* 27.0* 26.7* 24.3* 23.8*  MCV 94.3 94.7 95.4 97.2 94.8  PLT 252 265 258 257 246   BMP &GFR Recent Labs  Lab 10/30/23 0623 10/31/23 0525 11/01/23 0429 11/02/23 0654 11/03/23 0723  NA 136 136 135 135 137  K 4.0 4.0 4.0 3.9 3.9  CL 106 103 103 104 106  CO2 22 22 19* 22 24  GLUCOSE 82 88 167* 102* 87  BUN 10 12 21  26* 27*  CREATININE 1.08 1.18 1.37* 1.10 1.09  CALCIUM  8.6* 8.5* 8.6* 8.0* 8.4*  MG 2.0 2.0 2.1 2.0 1.9  PHOS 3.3 3.7 2.9 3.1 2.5   Estimated Creatinine Clearance: 65.1 mL/min (by C-G formula  based on SCr of 1.09 mg/dL). Liver & Pancreas: Recent Labs  Lab 10/30/23 1610 10/31/23 0525 11/01/23 0429 11/02/23 0654 11/03/23 0723  ALBUMIN 2.1* 2.2* 2.3* 2.2* 2.2*   No results for input(s): "LIPASE", "AMYLASE" in the last 168 hours. No results for input(s): "AMMONIA" in the last 168 hours. Diabetic: No results for input(s): "HGBA1C" in the last 72 hours. No results for input(s): "GLUCAP" in the last 168 hours.  Cardiac Enzymes: No results for input(s): "CKTOTAL", "CKMB", "CKMBINDEX", "TROPONINI" in the last 168 hours. No results for input(s): "PROBNP" in the last 8760 hours. Coagulation Profile: Recent Labs  Lab 10/29/23 0544 10/30/23 0623 10/31/23 0525 11/01/23 0429 11/02/23 0654  INR 1.1 1.2 1.2 1.3* 1.2   Thyroid  Function Tests: No results for input(s): "TSH", "T4TOTAL", "FREET4", "T3FREE", "THYROIDAB" in the last 72 hours. Lipid Profile: No results for input(s): "CHOL", "HDL", "LDLCALC", "TRIG", "CHOLHDL", "LDLDIRECT" in the last 72 hours.  Anemia Panel: No results for input(s): "VITAMINB12", "FOLATE", "FERRITIN", "TIBC", "IRON", "RETICCTPCT" in the last 72 hours.  Urine analysis:    Component Value Date/Time   COLORURINE YELLOW 08/13/2014 1330   APPEARANCEUR CLEAR 08/13/2014 1330   LABSPEC 1.010 08/13/2014 1330   PHURINE 7.5 08/13/2014 1330   GLUCOSEU NEGATIVE 08/13/2014 1330   HGBUR NEGATIVE 08/13/2014 1330   BILIRUBINUR NEGATIVE 08/13/2014 1330   KETONESUR NEGATIVE 08/13/2014 1330   PROTEINUR NEGATIVE 08/13/2014 1330   UROBILINOGEN 0.2 08/13/2014 1330   NITRITE NEGATIVE 08/13/2014 1330   LEUKOCYTESUR NEGATIVE 08/13/2014 1330   Sepsis Labs: Invalid input(s): "PROCALCITONIN", "LACTICIDVEN"  Microbiology: No results found for this or any previous visit (from the past 240 hours).   Radiology Studies: No results found.     Katriona Schmierer T. Antonette Hendricks [facility]  If 7PM-7AM, please contact night-coverage [email or web address] 11/03/2023, 10:42 AM

## 2023-11-04 DIAGNOSIS — M869 Osteomyelitis, unspecified: Secondary | ICD-10-CM | POA: Diagnosis not present

## 2023-11-04 LAB — CBC
HCT: 25.6 % — ABNORMAL LOW (ref 39.0–52.0)
Hemoglobin: 8.4 g/dL — ABNORMAL LOW (ref 13.0–17.0)
MCH: 30.4 pg (ref 26.0–34.0)
MCHC: 32.8 g/dL (ref 30.0–36.0)
MCV: 92.8 fL (ref 80.0–100.0)
Platelets: 241 10*3/uL (ref 150–400)
RBC: 2.76 MIL/uL — ABNORMAL LOW (ref 4.22–5.81)
RDW: 14.1 % (ref 11.5–15.5)
WBC: 9.3 10*3/uL (ref 4.0–10.5)
nRBC: 0 % (ref 0.0–0.2)

## 2023-11-04 LAB — BPAM RBC
Blood Product Expiration Date: 202505212359
ISSUE DATE / TIME: 202504261315
Unit Type and Rh: 5100

## 2023-11-04 LAB — TYPE AND SCREEN
ABO/RH(D): O POS
Antibody Screen: NEGATIVE
Unit division: 0

## 2023-11-04 NOTE — Progress Notes (Signed)
  Progress Note   Patient: Jason Spencer XBJ:[id number] DOB: [birth year] DOA: 10/21/2023     14 DOS: the patient was seen and examined on 11/04/2023   Brief hospital course: 73 year old M with PMH of COPD, CAD, atrial fibrillation/flutter on warfarin, HTN, HLD, EtOH use, anxiety, pleural effusion, psoriasis, PAD and tobacco use disorder presenting with progressive left 4th and 5th toe pain and discoloration for about 2 weeks, and admitted with critical limb ischemia with left 4th and 5th toe gangrene, and fifth metatarsal head osteomyelitis.    Patient was started on broad-spectrum antibiotics.  Underwent left lower extremity revascularization by vascular surgery on 4/17.  Underwent left foot TMA on 4/23.   Hospital course complicated by acute blood loss anemia due to hematoma from angiogram and bleeding from left AMA.  Transfused blood.  Anticoagulation held.  Currently stable.  Recommended SNF.   See hospital course for more detail  Assessment and Plan: L lower extremity critical limb ischemia with 4th and 5th dry gangrene & L foot OM - Completed antibx - Tylenol  PRN  - Norco q4 hr PRN  - IV morphine  1 mg q4 hr PRN  - Vit C 1000 mg PO daily  - Zinc  220 mg PO daily   Chronic afib  - Toprol  XL 100 mg PO daily  - Anticoagulation stopped due to anemia (see below)  AKI  - Monitor  ABLA due to R inguinal hematoma  - Monitor   Gastritis/esophagitis  - Protonix  40 mg PO bid   CAD - ASA 81 mg PO daily - Plavix  75 mg PO daily  - Crestor  20 mg PO daily   Psoriasis  - Clobetasol  cream bid   HTN  - Norvasc  5 mg PO daily  - Toprol  XL 100 mg PO daily   COPD  - Breztri  2 puff bid   Alcohol abuse  - Folic acid /MVI/Thiamine  PO daily   Subjective: Pt seen and examined at the bedside. He awaits SNF placement. H&H stable.  Physical Exam: Vitals:   11/03/23 1936 11/04/23 0011 11/04/23 0409 11/04/23 0834  BP: 118/73 134/63 (!) 151/69 (!) 130/59  Pulse: 78 74 71 84  Resp: 19 19  18 18   Temp: 97.9 F (36.6 C) 97.7 F (36.5 C) 97.7 F (36.5 C) 97.8 F (36.6 C)  TempSrc: Oral Oral Oral Oral  SpO2: 98% 98% 100% 98%  Weight:      Height:       Physical Exam HENT:     Head: Normocephalic.     Mouth/Throat:     Mouth: Mucous membranes are moist.  Cardiovascular:     Rate and Rhythm: Normal rate and regular rhythm.  Pulmonary:     Effort: Pulmonary effort is normal.  Abdominal:     Palpations: Abdomen is soft.  Skin:    General: Skin is warm.     Comments: L foot wrapped   Neurological:     Mental Status: He is alert. Mental status is at baseline.  Psychiatric:        Mood and Affect: Mood normal.       Disposition: Status is: Inpatient Remains inpatient appropriate because: Awaiting SNF  Planned Discharge Destination: Skilled nursing facility    Time spent: 35 minutes  Author: Saif Peter , MD 11/04/2023 9:11 AM  For on call review [email or web address].

## 2023-11-04 NOTE — Plan of Care (Signed)
  Problem: Education: Goal: Knowledge of General Education information will improve Description: Including pain rating scale, medication(s)/side effects and non-pharmacologic comfort measures Outcome: Progressing   Problem: Health Behavior/Discharge Planning: Goal: Ability to manage health-related needs will improve Outcome: Progressing   Problem: Clinical Measurements: Goal: Ability to maintain clinical measurements within normal limits will improve Outcome: Progressing Goal: Will remain free from infection Outcome: Progressing Goal: Diagnostic test results will improve Outcome: Progressing Goal: Respiratory complications will improve Outcome: Progressing Goal: Cardiovascular complication will be avoided Outcome: Progressing   Problem: Activity: Goal: Risk for activity intolerance will decrease Outcome: Progressing   Problem: Nutrition: Goal: Adequate nutrition will be maintained Outcome: Progressing   Problem: Coping: Goal: Level of anxiety will decrease Outcome: Progressing   Problem: Elimination: Goal: Will not experience complications related to bowel motility Outcome: Progressing Goal: Will not experience complications related to urinary retention Outcome: Progressing   Problem: Pain Managment: Goal: General experience of comfort will improve and/or be controlled Outcome: Progressing   Problem: Safety: Goal: Ability to remain free from injury will improve Outcome: Progressing   Problem: Skin Integrity: Goal: Risk for impaired skin integrity will decrease Outcome: Progressing   Problem: Education: Goal: Understanding of CV disease, CV risk reduction, and recovery process will improve Outcome: Progressing Goal: Individualized Educational Video(s) Outcome: Progressing   Problem: Activity: Goal: Ability to return to baseline activity level will improve Outcome: Progressing   Problem: Cardiovascular: Goal: Ability to achieve and maintain adequate  cardiovascular perfusion will improve Outcome: Progressing Goal: Vascular access site(s) Level 0-1 will be maintained Outcome: Progressing   Problem: Health Behavior/Discharge Planning: Goal: Ability to safely manage health-related needs after discharge will improve Outcome: Progressing   Problem: Education: Goal: Knowledge of the prescribed therapeutic regimen will improve Outcome: Progressing Goal: Ability to verbalize activity precautions or restrictions will improve Outcome: Progressing Goal: Understanding of discharge needs will improve Outcome: Progressing   Problem: Activity: Goal: Ability to perform//tolerate increased activity and mobilize with assistive devices will improve Outcome: Progressing   Problem: Clinical Measurements: Goal: Postoperative complications will be avoided or minimized Outcome: Progressing   Problem: Self-Care: Goal: Ability to meet self-care needs will improve Outcome: Progressing   Problem: Self-Concept: Goal: Ability to maintain and perform role responsibilities to the fullest extent possible will improve Outcome: Progressing   Problem: Pain Management: Goal: Pain level will decrease with appropriate interventions Outcome: Progressing   Problem: Skin Integrity: Goal: Demonstration of wound healing without infection will improve Outcome: Progressing

## 2023-11-05 DIAGNOSIS — M869 Osteomyelitis, unspecified: Secondary | ICD-10-CM | POA: Diagnosis not present

## 2023-11-05 LAB — COMPREHENSIVE METABOLIC PANEL WITH GFR
ALT: 23 U/L (ref 0–44)
AST: 24 U/L (ref 15–41)
Albumin: 2.3 g/dL — ABNORMAL LOW (ref 3.5–5.0)
Alkaline Phosphatase: 38 U/L (ref 38–126)
Anion gap: 10 (ref 5–15)
BUN: 10 mg/dL (ref 8–23)
CO2: 24 mmol/L (ref 22–32)
Calcium: 8.2 mg/dL — ABNORMAL LOW (ref 8.9–10.3)
Chloride: 101 mmol/L (ref 98–111)
Creatinine, Ser: 0.85 mg/dL (ref 0.61–1.24)
GFR, Estimated: >60 mL/min (ref >60)
Glucose, Bld: 93 mg/dL (ref 70–99)
Potassium: 3.7 mmol/L (ref 3.5–5.1)
Sodium: 135 mmol/L (ref 135–145)
Total Bilirubin: 0.8 mg/dL (ref 0.0–1.2)
Total Protein: 6.2 g/dL — ABNORMAL LOW (ref 6.5–8.1)

## 2023-11-05 LAB — CBC
HCT: 25.8 % — ABNORMAL LOW (ref 39.0–52.0)
Hemoglobin: 8.5 g/dL — ABNORMAL LOW (ref 13.0–17.0)
MCH: 30.4 pg (ref 26.0–34.0)
MCHC: 32.9 g/dL (ref 30.0–36.0)
MCV: 92.1 fL (ref 80.0–100.0)
Platelets: 234 10*3/uL (ref 150–400)
RBC: 2.8 MIL/uL — ABNORMAL LOW (ref 4.22–5.81)
RDW: 13.7 % (ref 11.5–15.5)
WBC: 10.1 10*3/uL (ref 4.0–10.5)
nRBC: 0.2 % (ref 0.0–0.2)

## 2023-11-05 LAB — SURGICAL PATHOLOGY

## 2023-11-05 LAB — MAGNESIUM: Magnesium: 2 mg/dL (ref 1.7–2.4)

## 2023-11-05 NOTE — TOC Progression Note (Signed)
 Transition of Care [facility]) - Progression Note    Patient Details  Name: Jason Spencer MRN: [id number] Date of Birth: 28-Jun-1951  Transition of Care [facility]) CM/SW Contact  Martez Weiand A [place], LCSW Phone Number: 11/05/2023, 4:48 PM  Clinical Narrative:     CSW spoke with pt about update to DC at [facility], informed him of probable DC tmrw to facility.  Pt stated he was concerned about cost of ambulance transport, CSW stated pt could not really transport in private vehicle in his current condition. He said he still wanted to follow up with CSW on his decision for possible DC in friends vehicle. CSW will follow up.   TOC will continue to follow.   Expected Discharge Plan: Home/Self Care Barriers to Discharge: Continued Medical Work up  Expected Discharge Plan and Services       Living arrangements for the past 2 months: Single Family Home                                       Social Determinants of Health (SDOH) Interventions SDOH Screenings   Food Insecurity: No Food Insecurity (10/21/2023)  Housing: Low Risk  (10/21/2023)  Transportation Needs: No Transportation Needs (10/21/2023)  Utilities: Not At Risk (10/21/2023)  Depression (PHQ2-9): Low Risk  (07/06/2023)  Financial Resource Strain: Low Risk  (01/08/2023)  Physical Activity: Insufficiently Active (01/08/2023)  Social Connections: Moderately Integrated (10/22/2023)  Tobacco Use: High Risk (10/31/2023)    Readmission Risk Interventions     No data to display

## 2023-11-05 NOTE — Progress Notes (Signed)
  Progress Note   Patient: Jason Spencer MVH:[id number] DOB: August 02, 1950 DOA: 10/21/2023     15 DOS: the patient was seen and examined on 11/05/2023   Brief hospital course: 73 year old M with PMH of COPD, CAD, atrial fibrillation/flutter on warfarin, HTN, HLD, EtOH use, anxiety, pleural effusion, psoriasis, PAD and tobacco use disorder presenting with progressive left 4th and 5th toe pain and discoloration for about 2 weeks, and admitted with critical limb ischemia with left 4th and 5th toe gangrene, and fifth metatarsal head osteomyelitis.    Patient was started on broad-spectrum antibiotics.  Underwent left lower extremity revascularization by vascular surgery on 4/17.  Underwent left foot TMA on 4/23.   Hospital course complicated by acute blood loss anemia due to hematoma from angiogram and bleeding from left AMA.  Transfused blood.  Anticoagulation held.  Currently stable.  Recommended SNF.   See hospital course for more detail  Assessment and Plan: L lower extremity critical limb ischemia with 4th and 5th dry gangrene & L foot OM - Completed antibx - Tylenol  PRN  - Norco q4 hr PRN  - IV morphine  1 mg q4 hr PRN  - Vit C 1000 mg PO daily  - Zinc  220 mg PO daily    Chronic afib  - Toprol  XL 100 mg PO daily  - Anticoagulation stopped due to anemia (see below)   AKI  - Resolved   ABLA due to R inguinal hematoma  - Monitor    Gastritis/esophagitis  - Protonix  40 mg PO bid    CAD - ASA 81 mg PO daily - Plavix  75 mg PO daily  - Crestor  20 mg PO daily    Psoriasis  - Clobetasol  cream bid    HTN  - Norvasc  5 mg PO daily  - Toprol  XL 100 mg PO daily    COPD  - Breztri  2 puff bid    Alcohol abuse  - Folic acid /MVI/Thiamine  PO daily   Subjective: Pt seen and examined at the bedside.  Awaiting SNF placement.  Physical Exam: Vitals:   11/04/23 1605 11/04/23 1958 11/05/23 0436 11/05/23 0737  BP: 137/60 137/87 (!) 142/78 (!) 151/62  Pulse: 79 76 81 96  Resp: 18 18 16  18   Temp: (!) 97.5 F (36.4 C) 98.4 F (36.9 C) 98.3 F (36.8 C) 98.1 F (36.7 C)  TempSrc: Oral Oral  Oral  SpO2: 99% 100% 100% 99%  Weight:      Height:       HENT:     Head: Normocephalic.     Mouth/Throat:     Mouth: Mucous membranes are moist.  Cardiovascular:     Rate and Rhythm: Normal rate and regular rhythm.  Pulmonary:     Effort: Pulmonary effort is normal.  Abdominal:     Palpations: Abdomen is soft.  Skin:    General: Skin is warm.     Comments: L foot wrapped   Neurological:     Mental Status: He is alert. Mental status is at baseline.  Psychiatric:        Mood and Affect: Mood normal.     Disposition: Status is: Inpatient Remains inpatient appropriate because: Awaiting SNF  Planned Discharge Destination: Skilled nursing facility    Time spent: 35 minutes  Author: Osiris Odriscoll , MD 11/05/2023 2:12 PM  For on call review [email or web address].

## 2023-11-05 NOTE — TOC Progression Note (Signed)
 Transition of Care [facility]) - Progression Note    Patient Details  Name: Jason Spencer MRN: [id number] Date of Birth: 1950/12/13  [facility]) CM/SW Contact  Sender Rueb A [place], LCSW Phone Number: 11/05/2023, 1:49 PM  Clinical Narrative:     Pt's auth approve for insurance.   Reference ID: [id number]  Approval Dates:  11/05/2023-11/07/2023  CSW waiting to hear back from insurance regarding whether pt can DC to [facility] today.   TOC will continue to follow.   Expected Discharge Plan: Home/Self Care Barriers to Discharge: Continued Medical Work up  Expected Discharge Plan and Services       Living arrangements for the past 2 months: Single Family Home                                       Social Determinants of Health (SDOH) Interventions SDOH Screenings   Food Insecurity: No Food Insecurity (10/21/2023)  Housing: Low Risk  (10/21/2023)  Transportation Needs: No Transportation Needs (10/21/2023)  Utilities: Not At Risk (10/21/2023)  Depression (PHQ2-9): Low Risk  (07/06/2023)  Financial Resource Strain: Low Risk  (01/08/2023)  Physical Activity: Insufficiently Active (01/08/2023)  Social Connections: Moderately Integrated (10/22/2023)  Tobacco Use: High Risk (10/31/2023)    Readmission Risk Interventions     No data to display

## 2023-11-05 NOTE — Plan of Care (Signed)
  Problem: Education: Goal: Knowledge of General Education information will improve Description: Including pain rating scale, medication(s)/side effects and non-pharmacologic comfort measures Outcome: Progressing   Problem: Health Behavior/Discharge Planning: Goal: Ability to manage health-related needs will improve Outcome: Progressing   Problem: Clinical Measurements: Goal: Ability to maintain clinical measurements within normal limits will improve Outcome: Progressing Goal: Will remain free from infection Outcome: Progressing Goal: Diagnostic test results will improve Outcome: Progressing Goal: Respiratory complications will improve Outcome: Progressing Goal: Cardiovascular complication will be avoided Outcome: Progressing   Problem: Activity: Goal: Risk for activity intolerance will decrease Outcome: Progressing   Problem: Nutrition: Goal: Adequate nutrition will be maintained Outcome: Progressing   Problem: Coping: Goal: Level of anxiety will decrease Outcome: Progressing   Problem: Elimination: Goal: Will not experience complications related to bowel motility Outcome: Progressing Goal: Will not experience complications related to urinary retention Outcome: Progressing   Problem: Pain Managment: Goal: General experience of comfort will improve and/or be controlled Outcome: Progressing   Problem: Safety: Goal: Ability to remain free from injury will improve Outcome: Progressing   Problem: Skin Integrity: Goal: Risk for impaired skin integrity will decrease Outcome: Progressing   Problem: Education: Goal: Understanding of CV disease, CV risk reduction, and recovery process will improve Outcome: Progressing Goal: Individualized Educational Video(s) Outcome: Progressing   Problem: Activity: Goal: Ability to return to baseline activity level will improve Outcome: Progressing   Problem: Cardiovascular: Goal: Ability to achieve and maintain adequate  cardiovascular perfusion will improve Outcome: Progressing Goal: Vascular access site(s) Level 0-1 will be maintained Outcome: Progressing   Problem: Health Behavior/Discharge Planning: Goal: Ability to safely manage health-related needs after discharge will improve Outcome: Progressing   Problem: Education: Goal: Knowledge of the prescribed therapeutic regimen will improve Outcome: Progressing Goal: Ability to verbalize activity precautions or restrictions will improve Outcome: Progressing Goal: Understanding of discharge needs will improve Outcome: Progressing   Problem: Activity: Goal: Ability to perform//tolerate increased activity and mobilize with assistive devices will improve Outcome: Progressing   Problem: Clinical Measurements: Goal: Postoperative complications will be avoided or minimized Outcome: Progressing   Problem: Self-Care: Goal: Ability to meet self-care needs will improve Outcome: Progressing   Problem: Self-Concept: Goal: Ability to maintain and perform role responsibilities to the fullest extent possible will improve Outcome: Progressing   Problem: Pain Management: Goal: Pain level will decrease with appropriate interventions Outcome: Progressing   Problem: Skin Integrity: Goal: Demonstration of wound healing without infection will improve Outcome: Progressing

## 2023-11-05 NOTE — Progress Notes (Signed)
 Physical Therapy Treatment Patient Details Name: Jason Spencer MRN: [id number] DOB: 18-Jun-1951 Today's Date: 11/05/2023   History of Present Illness Pt is a 73 y.o. 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 PMH: hypertension, hyperlipidemia, atrial fibrillation/flutter, CAD, COPD, QT prolongation, dCHF, alcohol use, anxiety, pleural effusion    PT Comments  Pt progressing towards all goals. Pt with improved ability to maintain L LE NWB during ambulation however continues to bear weight on L LE during sit to stand transfer. Pt fatigues quickly during ambulation with c/o L LE throbbing pain and fatigue through UEs. Pt remains appropriate for inpatient rehab program > 3 hrs a day to achieve safe mod I level of function prior to return home.   If plan is discharge home, recommend the following: Assistance with cooking/housework;Assist for transportation;Help with stairs or ramp for entrance;A lot of help with bathing/dressing/bathroom;Two people to help with walking and/or transfers   Can travel by private vehicle     No  Equipment Recommendations  Wheelchair (measurements PT);Wheelchair cushion (measurements PT);BSC/3in1;Rolling walker (2 wheels)    Recommendations for Other Services       Precautions / Restrictions Precautions Precautions: Fall Recall of Precautions/Restrictions: Impaired Precaution/Restrictions Comments: Pt required education on WBing status. He requested to use his heel when transferring for added balance. Reviewed how no weight should be placed on LLE at this time and the need to keep the dressing in place and wear post-op shoe when mobilizing. Required Braces or Orthoses: Other Brace Other Brace: L foot post-op shoe Restrictions Weight Bearing Restrictions Per Provider Order: Yes LLE Weight Bearing Per Provider  Order: Non weight bearing     Mobility  Bed Mobility Overal bed mobility: Needs Assistance Bed Mobility: Supine to Sit     Supine to sit: Supervision, HOB elevated     General bed mobility comments: Pt sat up on L side of bed with increased time and HOB significantly elevated. He brought BLE off EOB and used bedrails to pull trunk upright. Pt scooted fwd until feet flat with BUE support on bed.    Transfers Overall transfer level: Needs assistance Equipment used: Rolling walker (2 wheels) Transfers: Sit to/from Stand, Bed to chair/wheelchair/BSC Sit to Stand: Mod assist, +2 physical assistance, +2 safety/equipment, From elevated surface           General transfer comment: verbal cues to maintain L LE NWB and for hand placement, pt modAx2 to power up from EOB, minAx2 to power up from recliner    Ambulation/Gait Ambulation/Gait assistance: Min assist Gait Distance (Feet): 10 Feet (x1, 15x1) Assistive device: Rolling walker (2 wheels) Gait Pattern/deviations: Step-to pattern, Decreased stride length, Decreased weight shift to left, Antalgic Gait velocity: dec Gait velocity interpretation: <1.31 ft/sec, indicative of household ambulator   General Gait Details: pt able to keep L LE NWB, standing rest break every 3-5 hops, onset of fatigue, did where shoe on R foot to provide increased support to R LE while hopping   Stairs             Wheelchair Mobility     Tilt Bed    Modified Rankin (Stroke Patients Only)       Balance Overall balance assessment: Needs assistance Sitting-balance support: No upper extremity supported, Feet supported Sitting balance-Leahy Scale: Good Sitting balance - Comments: Pt sat EOB with supervision. He donned sock and  shoe on RLE , PT assisted with post op shoe   Standing balance support: Bilateral upper extremity supported, During functional activity, Reliant on assistive device for balance Standing balance-Leahy Scale:  Poor Standing balance comment: dependent on RW due to L LE NWB                            Communication Communication Communication: Impaired Factors Affecting Communication: Hearing impaired  Cognition Arousal: Alert Behavior During Therapy: WFL for tasks assessed/performed   PT - Cognitive impairments: No apparent impairments                         Following commands: Intact      Cueing Cueing Techniques: Verbal cues, Gestural cues  Exercises      General Comments General comments (skin integrity, edema, etc.): VSS on RA      Pertinent Vitals/Pain Pain Assessment Pain Assessment: 0-10 Pain Location: L foot Pain Descriptors / Indicators: Throbbing, Discomfort, Aching, Nagging, Pressure, Operative site guarding, Grimacing    Home Living                          Prior Function            PT Goals (current goals can now be found in the care plan section) Acute Rehab PT Goals PT Goal Formulation: With patient Time For Goal Achievement: 11/15/23 Potential to Achieve Goals: Good Progress towards PT goals: Progressing toward goals    Frequency    Min 2X/week      PT Plan      Co-evaluation   Reason for Co-Treatment: To address functional/ADL transfers          AM-PAC PT "6 Clicks" Mobility   Outcome Measure  Help needed turning from your back to your side while in a flat bed without using bedrails?: A Little Help needed moving from lying on your back to sitting on the side of a flat bed without using bedrails?: A Little Help needed moving to and from a bed to a chair (including a wheelchair)?: A Little Help needed standing up from a chair using your arms (e.g., wheelchair or bedside chair)?: A Little Help needed to walk in hospital room?: A Lot Help needed climbing 3-5 steps with a railing? : Total 6 Click Score: 15    End of Session Equipment Utilized During Treatment: Gait belt Activity Tolerance: Patient  tolerated treatment well;Patient limited by pain Patient left: in chair;with call bell/phone within reach;with chair alarm set Nurse Communication: Mobility status PT Visit Diagnosis: Muscle weakness (generalized) (M62.81);Unsteadiness on feet (R26.81);Pain;Difficulty in walking, not elsewhere classified (R26.2) Pain - Right/Left: Left Pain - part of body: Ankle and joints of foot     Time: 1036-1101 PT Time Calculation (min) (ACUTE ONLY): 25 min  Charges:    $Gait Training: 8-22 mins $Therapeutic Activity: 8-22 mins PT General Charges $$ ACUTE PT VISIT: 1 Visit                     Renaee Caro, PT, DPT Acute Rehabilitation Services Secure chat preferred Office #: [phone]    Jenna Moan 11/05/2023, 1:04 PM

## 2023-11-05 NOTE — Progress Notes (Signed)
 Mobility Specialist: Progress Note   11/05/23 1504  Mobility  Activity Transferred from chair to bed  Level of Assistance Moderate assist, patient does 50-74%  Assistive Device Front wheel walker  LLE Weight Bearing Per Provider Order NWB  Activity Response Tolerated well  Mobility Referral Yes  Mobility visit 1 Mobility  Mobility Specialist Start Time (ACUTE ONLY) 1214  Mobility Specialist Stop Time (ACUTE ONLY) 1227  Mobility Specialist Time Calculation (min) (ACUTE ONLY) 13 min    Pt requesting to return to bed - received in chair. Heavy modA for STS, minA for stand pivot to bed. SV for bed mobility. C/o throbbing sensation in LLE during mobility. Left in bed with all needs met, call bell in reach.  Deloria Fetch Mobility Specialist Please contact via SecureChat or Rehab office at [phone]

## 2023-11-06 DIAGNOSIS — K2990 Gastroduodenitis, unspecified, without bleeding: Secondary | ICD-10-CM | POA: Diagnosis not present

## 2023-11-06 DIAGNOSIS — I10 Essential (primary) hypertension: Secondary | ICD-10-CM | POA: Diagnosis not present

## 2023-11-06 DIAGNOSIS — I739 Peripheral vascular disease, unspecified: Secondary | ICD-10-CM | POA: Diagnosis not present

## 2023-11-06 DIAGNOSIS — I96 Gangrene, not elsewhere classified: Secondary | ICD-10-CM | POA: Diagnosis not present

## 2023-11-06 DIAGNOSIS — R531 Weakness: Secondary | ICD-10-CM | POA: Diagnosis not present

## 2023-11-06 DIAGNOSIS — D692 Other nonthrombocytopenic purpura: Secondary | ICD-10-CM | POA: Diagnosis not present

## 2023-11-06 DIAGNOSIS — Z4781 Encounter for orthopedic aftercare following surgical amputation: Secondary | ICD-10-CM | POA: Diagnosis not present

## 2023-11-06 DIAGNOSIS — I2510 Atherosclerotic heart disease of native coronary artery without angina pectoris: Secondary | ICD-10-CM | POA: Diagnosis not present

## 2023-11-06 DIAGNOSIS — Z7189 Other specified counseling: Secondary | ICD-10-CM | POA: Diagnosis not present

## 2023-11-06 DIAGNOSIS — Z89432 Acquired absence of left foot: Secondary | ICD-10-CM | POA: Diagnosis not present

## 2023-11-06 DIAGNOSIS — I429 Cardiomyopathy, unspecified: Secondary | ICD-10-CM | POA: Diagnosis not present

## 2023-11-06 DIAGNOSIS — Z7401 Bed confinement status: Secondary | ICD-10-CM | POA: Diagnosis not present

## 2023-11-06 DIAGNOSIS — E559 Vitamin D deficiency, unspecified: Secondary | ICD-10-CM | POA: Diagnosis not present

## 2023-11-06 DIAGNOSIS — F411 Generalized anxiety disorder: Secondary | ICD-10-CM | POA: Diagnosis not present

## 2023-11-06 DIAGNOSIS — I700 Atherosclerosis of aorta: Secondary | ICD-10-CM | POA: Diagnosis not present

## 2023-11-06 DIAGNOSIS — Z5181 Encounter for therapeutic drug level monitoring: Secondary | ICD-10-CM | POA: Diagnosis not present

## 2023-11-06 DIAGNOSIS — R52 Pain, unspecified: Secondary | ICD-10-CM | POA: Diagnosis not present

## 2023-11-06 DIAGNOSIS — M86172 Other acute osteomyelitis, left ankle and foot: Secondary | ICD-10-CM | POA: Diagnosis not present

## 2023-11-06 DIAGNOSIS — W19XXXA Unspecified fall, initial encounter: Secondary | ICD-10-CM | POA: Diagnosis not present

## 2023-11-06 DIAGNOSIS — D62 Acute posthemorrhagic anemia: Secondary | ICD-10-CM | POA: Diagnosis not present

## 2023-11-06 DIAGNOSIS — L89152 Pressure ulcer of sacral region, stage 2: Secondary | ICD-10-CM | POA: Diagnosis not present

## 2023-11-06 DIAGNOSIS — E785 Hyperlipidemia, unspecified: Secondary | ICD-10-CM | POA: Diagnosis not present

## 2023-11-06 DIAGNOSIS — M869 Osteomyelitis, unspecified: Secondary | ICD-10-CM | POA: Diagnosis not present

## 2023-11-06 DIAGNOSIS — I4892 Unspecified atrial flutter: Secondary | ICD-10-CM | POA: Diagnosis not present

## 2023-11-06 DIAGNOSIS — Z79899 Other long term (current) drug therapy: Secondary | ICD-10-CM | POA: Diagnosis not present

## 2023-11-06 DIAGNOSIS — J449 Chronic obstructive pulmonary disease, unspecified: Secondary | ICD-10-CM | POA: Diagnosis not present

## 2023-11-06 DIAGNOSIS — I998 Other disorder of circulatory system: Secondary | ICD-10-CM | POA: Diagnosis not present

## 2023-11-06 LAB — CBC
HCT: 25.7 % — ABNORMAL LOW (ref 39.0–52.0)
Hemoglobin: 8.4 g/dL — ABNORMAL LOW (ref 13.0–17.0)
MCH: 30.1 pg (ref 26.0–34.0)
MCHC: 32.7 g/dL (ref 30.0–36.0)
MCV: 92.1 fL (ref 80.0–100.0)
Platelets: 256 10*3/uL (ref 150–400)
RBC: 2.79 MIL/uL — ABNORMAL LOW (ref 4.22–5.81)
RDW: 13.6 % (ref 11.5–15.5)
WBC: 13.7 10*3/uL — ABNORMAL HIGH (ref 4.0–10.5)
nRBC: 0.6 % — ABNORMAL HIGH (ref 0.0–0.2)

## 2023-11-06 LAB — MAGNESIUM: Magnesium: 1.9 mg/dL (ref 1.7–2.4)

## 2023-11-06 LAB — COMPREHENSIVE METABOLIC PANEL WITH GFR
ALT: 21 U/L (ref 0–44)
AST: 20 U/L (ref 15–41)
Albumin: 2.3 g/dL — ABNORMAL LOW (ref 3.5–5.0)
Alkaline Phosphatase: 38 U/L (ref 38–126)
Anion gap: 7 (ref 5–15)
BUN: 12 mg/dL (ref 8–23)
CO2: 25 mmol/L (ref 22–32)
Calcium: 8.2 mg/dL — ABNORMAL LOW (ref 8.9–10.3)
Chloride: 103 mmol/L (ref 98–111)
Creatinine, Ser: 0.96 mg/dL (ref 0.61–1.24)
GFR, Estimated: >60 mL/min (ref >60)
Glucose, Bld: 100 mg/dL — ABNORMAL HIGH (ref 70–99)
Potassium: 4.3 mmol/L (ref 3.5–5.1)
Sodium: 135 mmol/L (ref 135–145)
Total Bilirubin: 0.9 mg/dL (ref 0.0–1.2)
Total Protein: 6.2 g/dL — ABNORMAL LOW (ref 6.5–8.1)

## 2023-11-06 MED ORDER — ASPIRIN 81 MG PO TBEC: 81.0000 mg | DELAYED_RELEASE_TABLET | Freq: Every day | ORAL | 0 refills | Status: AC

## 2023-11-06 MED ORDER — CLOPIDOGREL BISULFATE 75 MG PO TABS: 75.0000 mg | ORAL_TABLET | Freq: Every day | ORAL | 0 refills | Status: AC

## 2023-11-06 NOTE — Progress Notes (Signed)
 Occupational Therapy Treatment Patient Details Name: Jason Spencer MRN: [id number] DOB: 1951/01/01 Today's Date: 11/06/2023   History of present illness Pt is a 73 y.o. 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 PMH: hypertension, hyperlipidemia, atrial fibrillation/flutter, CAD, COPD, QT prolongation, dCHF, alcohol use, anxiety, pleural effusion   OT comments  Pt making good progress with functional goals.He is eager to d/c to SNF for rehab. Pt is at mod/min A level with LB ADLs seated EOB using compensatory strategies, mod A sit - stand/transfers using RW with verbal cues for safety and WB status.       If plan is discharge home, recommend the following:  A lot of help with walking and/or transfers;A little help with walking and/or transfers;Assistance with cooking/housework;Assist for transportation;Help with stairs or ramp for entrance   Equipment Recommendations  BSC/3in1    Recommendations for Other Services      Precautions / Restrictions Precautions Precautions: Fall Required Braces or Orthoses: Other Brace Other Brace: L foot post-op shoe Restrictions Weight Bearing Restrictions Per Provider Order: Yes LLE Weight Bearing Per Provider Order: Non weight bearing       Mobility Bed Mobility Overal bed mobility: Needs Assistance Bed Mobility: Supine to Sit, Sit to Supine     Supine to sit: Supervision, HOB elevated Sit to supine: Supervision        Transfers Overall transfer level: Needs assistance Equipment used: Rolling walker (2 wheels) Transfers: Sit to/from Stand, Bed to chair/wheelchair/BSC Sit to Stand: Mod assist           General transfer comment: verbal cues to maintain L LE NWB and for hand placement     Balance Overall balance assessment: Needs assistance Sitting-balance support: No upper  extremity supported, Feet supported Sitting balance-Leahy Scale: Good Sitting balance - Comments: Pt sat EOB with supervision. Pt donned sock and shoe on R LE , assisted with post op shoe   Standing balance support: Bilateral upper extremity supported, During functional activity, Reliant on assistive device for balance Standing balance-Leahy Scale: Poor                             ADL either performed or assessed with clinical judgement   ADL Overall ADL's : Needs assistance/impaired                     Lower Body Dressing: Supervision/safety;Contact guard assist;Cueing for compensatory techniques;Sitting/lateral leans   Toilet Transfer: Moderate assistance;Rolling walker (2 wheels);BSC/3in1;Cueing for safety   Toileting- Clothing Manipulation and Hygiene: Moderate assistance;Sit to/from stand;Sitting/lateral lean       Functional mobility during ADLs: Moderate assistance;Cueing for safety      Extremity/Trunk Assessment Upper Extremity Assessment Upper Extremity Assessment: Overall WFL for tasks assessed;Right hand dominant   Lower Extremity Assessment Lower Extremity Assessment: Defer to PT evaluation   Cervical / Trunk Assessment Cervical / Trunk Assessment: Normal    Vision Ability to See in Adequate Light: 0 Adequate Patient Visual Report: No change from baseline     Perception     Praxis     Communication Communication Communication: Impaired Factors Affecting Communication: Hearing impaired   Cognition Arousal: Alert  Following commands: Intact        Cueing   Cueing Techniques: Verbal cues  Exercises      Shoulder Instructions       General Comments      Pertinent Vitals/ Pain       Pain Assessment Pain Assessment: Faces Faces Pain Scale: Hurts a little bit Pain Location: L foot Pain Descriptors / Indicators: Discomfort Pain Intervention(s): Monitored during session,  Repositioned  Home Living                                          Prior Functioning/Environment              Frequency  Min 2X/week        Progress Toward Goals  OT Goals(current goals can now be found in the care plan section)  Progress towards OT goals: Progressing toward goals     Plan      Co-evaluation                 AM-PAC OT "6 Clicks" Daily Activity     Outcome Measure   Help from another person eating meals?: None Help from another person taking care of personal grooming?: A Little Help from another person toileting, which includes using toliet, bedpan, or urinal?: A Lot Help from another person bathing (including washing, rinsing, drying)?: A Little Help from another person to put on and taking off regular upper body clothing?: A Little Help from another person to put on and taking off regular lower body clothing?: A Lot 6 Click Score: 17    End of Session Equipment Utilized During Treatment: Gait belt;Rolling walker (2 wheels);Other (comment) (L post op shoe)  OT Visit Diagnosis: Unsteadiness on feet (R26.81);Other abnormalities of gait and mobility (R26.89);Pain Pain - Right/Left: Left Pain - part of body: Ankle and joints of foot   Activity Tolerance Patient tolerated treatment well   Patient Left in bed;with bed alarm set;with call bell/phone within reach   Nurse Communication Mobility status        Time: 1001-1026 OT Time Calculation (min): 25 min  Charges: OT General Charges $OT Visit: 1 Visit OT Treatments $Self Care/Home Management : 8-22 mins $Therapeutic Activity: 8-22 mins    Jason Spencer 11/06/2023, 1:03 PM

## 2023-11-06 NOTE — Discharge Summary (Signed)
 Physician Discharge Summary   Patient: Jason Spencer MRN: [id number] DOB: 11-May-1951  Admit date:     10/21/2023  Discharge date: 11/06/23  Discharge Physician: Mickle Albe    PCP: Yevette Hem, FNP   Discharge Diagnoses: Principal Problem:   Osteomyelitis [facility]) Active Problems:   CAD (coronary artery disease)   History of prolonged Q-T interval on ECG   Essential hypertension, benign   Hyperlipidemia   COPD (chronic obstructive pulmonary disease) (HCC)   GAD (generalized anxiety disorder)   Atrial flutter (HCC)   Alcohol abuse   Gangrene of left foot (HCC)   Limb ischemia   PAD (peripheral artery disease) (HCC)   Acute osteomyelitis of toe of left foot (HCC)   Heme positive stool   Acute blood loss anemia   Gastritis and gastroduodenitis   Candida esophagitis (HCC)  Resolved Problems:   * No resolved hospital problems. *  Hospital Course: 73 year old M with PMH of COPD, CAD, atrial fibrillation/flutter on warfarin, HTN, HLD, EtOH use, anxiety, pleural effusion, psoriasis, PAD and tobacco use disorder presenting with progressive left 4th and 5th toe pain and discoloration for about 2 weeks, and admitted with critical limb ischemia with left 4th and 5th toe gangrene, and fifth metatarsal head osteomyelitis.   He had mild leukocytosis with elevated CRP to 9.9.  Has no fever.  Pro-Cal negative.  Foot x-ray showed subcutaneous gas about the head of the fifth metatarsal and proximal phalanx of left fifth toe with cortical loss medially from the fifth metatarsal head consistent with osteomyelitis.  Blood cultures obtained.  Patient was started on broad-spectrum antibiotics.  Orthopedic surgery and vascular surgery consulted.   CT angio showed severe bilateral multifocal high-grade stenosis from femoral arteries down, and aortic atherosclerosis.    INR reversed with vitamin K and he underwent LLE angiogram with shockwave lithotripsy, drug-coated balloon angioplasty and stent to  left SFA and above knee left popliteal artery on 4/17.  Started on Plavix  and aspirin .  Remains on broad-spectrum antibiotics.  S/p transmetatarsal amputation during this admission.  Hgb dropped about 4 g dropped without overt bleeding.  Heparin  discontinued.  GI consulted.  EGD showed esophageal plaque concerning for candidiasis (biopsied), large grade B reflux esophagitis, gastritis (biopsied) and normal duodenum.  GI recommended p.o. Protonix  40 mg twice daily, continuing antiplatelet and anticoagulation and Diflucan .  CT abdomen and pelvis demonstrated multiple small hematomas in right inguinal region with surrounding stranding and fluid felt to be patient source of blood loss anemia.  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  Anticoagulation resumed with warfarin and heparin  bridge on 4/24 but patient had bleeding from his right TMA.  Anticoagulation discontinued on 4/25.  Pt should be re-evaluated by the physician at the SNF for resumption of the pt's coumadin  (once he is further out from the surgery and has had time to recover more).   DISCHARGE MEDICATION: Allergies as of 11/06/2023       Reactions   Shellfish Allergy Nausea And Vomiting, Other (See Comments)   Shrimp, especially        Medication List     STOP taking these medications    lidocaine  4 %   lisinopril  40 MG tablet Commonly known as: ZESTRIL    triamcinolone  ointment 0.5 % Commonly known as: KENALOG    warfarin 5 MG tablet Commonly known as: COUMADIN        TAKE these medications    amLODipine  5 MG tablet Commonly known as: NORVASC  TAKE ONE TABLET ONCE DAILY  aspirin  EC 81 MG tablet Take 1 tablet (81 mg total) by mouth daily. Swallow whole.   Breztri  Aerosphere 160-9-4.8 MCG/ACT Aero inhaler Generic drug: budeson-glycopyrrolate -formoterol  Inhale 2 puffs into the lungs 2 (two) times daily. What changed:  how much to take when to take this   clopidogrel  75 MG tablet Commonly known as:  PLAVIX  Take 1 tablet (75 mg total) by mouth daily.   metoprolol  succinate 100 MG 24 hr tablet Commonly known as: TOPROL -XL TAKE 1 TABLET WITH OR IMMEDIATELY FOLLOWING A MEAL   rosuvastatin  5 MG tablet Commonly known as: Crestor  Take 1 tablet (5 mg total) by mouth daily.   Vaseline Pure Ultra White ointment Generic drug: white petrolatum Apply 1 application  topically as needed (for psoriasis- affected areas).        Follow-up Information     [facility] Follow up in 4 week(s).   Specialty: Vascular Surgery Why: Office will call to arrange your appt(s) Contact information: [address] [place]  [address] [phone]        Timothy Ford, MD Follow up in 1 week(s).   Specialty: Orthopedic Surgery Contact information: [address] [place]  [place] [address] [phone]                Discharge Exam: Filed Weights   10/22/23 2345 10/25/23 2145 11/01/23 1120  Weight: 84.1 kg 79.4 kg 80.1 kg   Physical Exam HENT:     Head: Normocephalic.     Mouth/Throat:     Mouth: Mucous membranes are moist.  Cardiovascular:     Rate and Rhythm: Normal rate.  Pulmonary:     Effort: Pulmonary effort is normal.  Abdominal:     Palpations: Abdomen is soft.  Musculoskeletal:     Cervical back: Neck supple.  Skin:    General: Skin is warm.  Neurological:     Mental Status: He is alert. Mental status is at baseline.  Psychiatric:        Mood and Affect: Mood normal.      Condition at discharge: fair  The results of significant diagnostics from this hospitalization (including imaging, microbiology, ancillary and laboratory) are listed below for reference.   Imaging Studies: CT ABDOMEN PELVIS W CONTRAST Result Date: 10/30/2023 CLINICAL DATA:  Anemia.  Groin hematoma. EXAM: CT ABDOMEN AND PELVIS WITH CONTRAST TECHNIQUE: Multidetector CT imaging of the abdomen and pelvis was performed using the standard  protocol following bolus administration of intravenous contrast. RADIATION DOSE REDUCTION: This exam was performed according to the departmental dose-optimization program which includes automated exposure control, adjustment of the mA and/or kV according to patient size and/or use of iterative reconstruction technique. CONTRAST:  75mL OMNIPAQUE  IOHEXOL  350 MG/ML SOLN COMPARISON:  CTA abdomen and pelvis 10/21/2023 FINDINGS: Lower chest: Right pleural calcifications are present. Perfect calcified density in the lateral right pleural is partially imaged which may be related to chronic loculated fluid or other chronic process. Hepatobiliary: No focal liver abnormality is seen. No gallstones, gallbladder wall thickening, or biliary dilatation. Pancreas: Unremarkable. No pancreatic ductal dilatation or surrounding inflammatory changes. Spleen: The spleen is normal in size. Rounded hypodense area in the spleen measures 14 mm, most likely a cyst. Adrenals/Urinary Tract: There is a cyst in the left kidney measuring 15 mm. Otherwise, the kidneys, adrenal glands, and bladder are within normal limits. Stomach/Bowel: Stomach is within normal limits. Appendix appears normal. No evidence of bowel wall thickening, distention, or inflammatory changes. There is sigmoid colon  diverticulosis. Vascular/Lymphatic: There is severe atherosclerosis of the aorta, iliac arteries and other more peripheral vessels. No enlarged lymph nodes are seen. Reproductive: Prostate is unremarkable. Other: There is no ascites or free air. There is a small fat containing umbilical hernia. There is no retroperitoneal hematoma. There is stranding, fluid and multiple small hematomas in the right inguinal region. The largest hematoma measures 1.5 x 1.3 by 4.5 cm. Musculoskeletal: No fracture is seen. IMPRESSION: 1. Multiple small hematomas in the right inguinal region with surrounding stranding and fluid. No retroperitoneal hematoma. 2. Right pleural  calcifications. 3. Severe atherosclerosis. 4. Sigmoid colon diverticulosis. Aortic Atherosclerosis (ICD10-I70.0). Electronically Signed   By: Tyron Gallon M.D.   On: 10/30/2023 16:06   PERIPHERAL VASCULAR CATHETERIZATION Result Date: 10/25/2023 Images from the original result were not included.   Patient name: Jason Spencer MRN: [id number]        DOB: 11/07/1950        Sex: male  10/25/2023 Pre-operative Diagnosis: Critical limb ischemia of left lower extremity with tissue loss Post-operative diagnosis:  Same Surgeon:  Young Hensen, MD Procedure Performed: 1.  Ultrasound-guided access right common femoral artery 2.  Aortogram with catheter selection of aorta 3.  Left lower extremity arteriogram with catheter selection of the left peroneal artery 4.  Shockwave lithotripsy left SFA and above-knee popliteal artery (4 mm x 80 mm shockwave x 400 pulses) 5.  Shockwave lithotripsy of the left behind the knee popliteal artery, below-knee popliteal artery, TP trunk, proximal peroneal artery (3 mm x 80 mm shockwave x 400 pulses) 6.  Drug-coated balloon angioplasty left popliteal artery (4 mm x 80 mm drug-coated Ranger) 7.  Angioplasty and stent of the left SFA and above-knee popliteal artery (6 mm x 150 mm coated Eluvia x 2 and 6 mm x 60 mm Eluvia all postdilated with a 5 mm Mustang) 8.  137 minutes monitored moderate conscious sedation  Indications: 73 year old male seen with critical limb ischemia with tissue loss in the left lower extremity.  He presents for aortogram, lower extremity arteriogram with possible intervention with a focus on the left leg.  Findings:  Ultrasound-guided access right common femoral artery.  Aortogram showed patent renal arteries bilaterally with a patent infrarenal aorta.  On the left there was calcification in the common iliac but otherwise no flow-limiting stenosis in the left common iliac or external iliac.  There was some common femoral disease that did not appear flow-limiting.   The profunda was patent.  Patient had diffusely diseased SFA with long segment significant high-grade greater than 80% flow-limiting calcified stenosis with a distal chronic total occlusion.  He reconstituted above-knee popliteal artery and then had a high-grade subtotal occlusion behind the knee in the popliteal artery.  Dominant runoff was in the peroneal artery.  The anterior tibial was patent although there was some disease proximally near the ostium.  Ultimately I was able to get through the left SFA popliteal disease antegrade and into the peroneal artery although this was very challenging.  Initially performed shockwave lithotripsy of the mid to distal SFA and above-knee popliteal artery with 4 mm shockwave lithotripsy balloon x 400 pulses throughout the segment.  I could not get this to track to the popliteal artery behind the knee due to a subtotal occlusion.  I then used a 3 mm shockwave lithotripsy balloon and treated the behind the knee popliteal artery below-knee popliteal artery as well as the TP trunk and proximal peroneal artery with 400 pulses as well.  I  then elected to use drug-coated balloon behind the knee for the subtotal occlusion as there was still significant residual flow-limiting stenosis with a 4 mm Ranger for 3 minutes.  This showed much better results.  Finally I elected to stent the entire SFA above-knee popliteal artery with 6 mm Eluvia's postdilated with a 5 mm Mustang.  Patient now has widely patent stents with no evidence of significant residual stenosis behind the knee and two-vessel runoff.             Procedure:  The patient was identified in the holding area and taken to room 8.  The patient was then placed supine on the table and prepped and draped in the usual sterile fashion.  A time out was called.  The patient received Versed  and fentanyl  for conscious moderate sedation.  Vital signs were monitored including heart rate, respiratory rate, oxygenation and blood pressure.  I  was present for all moderate sedation.  Ultrasound was used to evaluate the right common femoral artery.  It was patent .  A digital ultrasound image was acquired.  A micropuncture needle was used to access the right common femoral artery under ultrasound guidance.  An 018 wire was advanced without resistance and a micropuncture sheath was placed.  The 018 wire was removed and a benson wire was placed.  The micropuncture sheath was exchanged for a 5 french sheath.  An omniflush catheter was advanced over the wire to the level of L-1.  An abdominal angiogram was obtained.  Next, using the omniflush catheter and a benson wire, the aortic bifurcation was crossed and the catheter was placed into theleft external iliac artery and left runoff was obtained.   Ultimately elected for intervention and I used a Glidewire advantage and upsized to a long 6 [place] catapult sheath in the right groin over the aortic bifurcation into the left common femoral.  Patient was given 100 units/kg IV heparin .  Additional heparin  was given during the case.  I then used a Glidewire advantage with quick cross catheter and I was able to get through the left SFA disease including the chronic total occlusion and this was challenging.  I then met additional technical issue behind the knee as initially I could not get through the popliteal lesion and the wire wanted to go out collateral vessels.  Finally got through the subtotal occlusion behind the knee and got a wire down the peroneal artery.  I then selected a 4 mm x 80 mm shockwave lithotripsy balloon and exchanged for a [place] core wire for an 014 system.  I performed shockwave of the above-knee popliteal artery into the SFA with inflation to 4 atm and using at least 80-120 pulses in each segment until the entire 400 pulses was used.  This balloon would not track behind the knee due to the subtotal occlusion.  Ultimately I did briefly lose wire access and then spent a lot of time getting back  through the popliteal artery behind the knee and finally got my wire back down the peroneal again.  I elected to use a smaller shockwave balloon and selected a 3 mm x 80 mm shockwave balloon and I was able to get this to track through the subtotal occlusion in the popliteal artery behind the knee all the way into the proximal peroneal.  I then inflated this to 3 atm and again used 80-120 pulses for the proximal peroneal artery, TP trunk and below-knee popliteal artery and popliteal artery behind the knee.  There was still significant residual disease in the popliteal artery behind the knee so I then used a 4 mm x 80 mm drug-coated Ranger for a long 3-minute inflation.  I was much happier with these results and no significant residual stenosis.  I then elected to stent the entire SFA above-knee popliteal artery on the left with 6 mm Eluvia's.  I then placed a 6 mm x 150 mm Eluvia x 2 and a 6 mm x 60 mm Eluvia all postdilated with 5 mm Mustang's.  Patient now has widely patent stents with a patent popliteal artery behind the knee and two-vessel runoff without flow-limiting stenosis.  All of his arteries are calcified and heavily diseased.  Short 6 French sheath was placed in the right groin after removed wires and catheters.  To be taken holding for sheath removal.  Plan: Excellent results.  Optimized from vascular surgery.  Aspirin , plavix , statin.   Young Hensen, MD [facility] Office: [phone]  Young Hensen    ECHOCARDIOGRAM COMPLETE Result Date: 10/23/2023    ECHOCARDIOGRAM REPORT   Patient Name:   Jason Spencer Date of Exam: 10/23/2023 Medical Rec #:  [id number]      Height:       70.0 in Accession #:    [id number]     Weight:       185.4 lb Date of Birth:  September 19, 1950     BSA:          2.021 m Patient Age:    72 years       BP:           117/55 mmHg Patient Gender: M              HR:           70 bpm. Exam Location:  Inpatient Procedure: 2D Echo, Cardiac  Doppler and Color Doppler (Both Spectral and Color            Flow Doppler were utilized during procedure). Indications:    I50.40* Unspecified combined systolic (congestive) and diastolic                 (congestive) heart failure  History:        Patient has prior history of Echocardiogram examinations, most                 recent 08/06/2019. CHF and Cardiomyopathy, Abnormal ECG, COPD,                 Arrythmias:Atrial Flutter; Risk Factors:Current Smoker. ETOH.  Sonographer:    Raynelle Callow RDCS Referring Phys: [id number] [facility]  Sonographer Comments: Technically difficult study due to poor echo windows. Patient moving throughout exam grabbing onto foot. IMPRESSIONS  1. Left ventricular ejection fraction, by estimation, is 65 to 70%. The left ventricle has normal function. The left ventricle has no regional wall motion abnormalities. Left ventricular diastolic parameters are indeterminate.  2. Right ventricular systolic function is normal. The right ventricular size is mildly enlarged.  3. Left atrial size was moderately dilated.  4. The mitral valve is normal in structure. Trivial mitral valve regurgitation. No evidence of mitral stenosis.  5. The aortic valve is tricuspid. There is mild calcification of the aortic valve. There is mild thickening of the aortic valve. Aortic valve regurgitation is not visualized. Aortic valve sclerosis is present, with no evidence of aortic valve stenosis. Aortic valve mean gradient measures 4.7 mmHg. Aortic valve Vmax measures  1.49 m/s.  6. The inferior vena cava is normal in size with greater than 50% respiratory variability, suggesting right atrial pressure of 3 mmHg. FINDINGS  Left Ventricle: Left ventricular ejection fraction, by estimation, is 65 to 70%. The left ventricle has normal function. The left ventricle has no regional wall motion abnormalities. The left ventricular internal cavity size was normal in size. There is  no left ventricular hypertrophy. Left ventricular  diastolic parameters are indeterminate. Right Ventricle: The right ventricular size is mildly enlarged. No increase in right ventricular wall thickness. Right ventricular systolic function is normal. Left Atrium: Left atrial size was moderately dilated. Right Atrium: Right atrial size was normal in size. Pericardium: There is no evidence of pericardial effusion. Mitral Valve: The mitral valve is normal in structure. Mild mitral annular calcification. Trivial mitral valve regurgitation. No evidence of mitral valve stenosis. Tricuspid Valve: The tricuspid valve is normal in structure. Tricuspid valve regurgitation is not demonstrated. No evidence of tricuspid stenosis. Aortic Valve: The aortic valve is tricuspid. There is mild calcification of the aortic valve. There is mild thickening of the aortic valve. Aortic valve regurgitation is not visualized. Aortic valve sclerosis is present, with no evidence of aortic valve stenosis. Aortic valve mean gradient measures 4.7 mmHg. Aortic valve peak gradient measures 8.9 mmHg. Aortic valve area, by VTI measures 2.05 cm. Pulmonic Valve: The pulmonic valve was normal in structure. Pulmonic valve regurgitation is not visualized. No evidence of pulmonic stenosis. Aorta: The aortic root is normal in size and structure. Venous: The inferior vena cava is normal in size with greater than 50% respiratory variability, suggesting right atrial pressure of 3 mmHg. IAS/Shunts: No atrial level shunt detected by color flow Doppler.  LEFT VENTRICLE PLAX 2D LVIDd:         4.70 cm LVIDs:         3.20 cm LV PW:         1.60 cm LV IVS:        1.20 cm LVOT diam:     2.20 cm LV SV:         57 LV SV Index:   28 LVOT Area:     3.80 cm  LV Volumes (MOD) LV vol d, MOD A2C: 84.1 ml LV vol d, MOD A4C: 73.6 ml LV vol s, MOD A2C: 24.1 ml LV vol s, MOD A4C: 24.2 ml LV SV MOD A2C:     60.0 ml LV SV MOD A4C:     73.6 ml LV SV MOD BP:      57.6 ml RIGHT VENTRICLE            IVC RV S prime:     9.03 cm/s  IVC  diam: 1.90 cm TAPSE (M-mode): 1.3 cm LEFT ATRIUM             Index        RIGHT ATRIUM           Index LA diam:        5.00 cm 2.47 cm/m   RA Area:     21.90 cm LA Vol (A2C):   91.1 ml 45.07 ml/m  RA Volume:   63.70 ml  31.52 ml/m LA Vol (A4C):   83.0 ml 41.07 ml/m LA Biplane Vol: 90.3 ml 44.68 ml/m  AORTIC VALVE AV Area (Vmax):    2.03 cm AV Area (Vmean):   2.02 cm AV Area (VTI):     2.05 cm AV Vmax:  149.33 cm/s AV Vmean:          100.367 cm/s AV VTI:            0.278 m AV Peak Grad:      8.9 mmHg AV Mean Grad:      4.7 mmHg LVOT Vmax:         79.90 cm/s LVOT Vmean:        53.300 cm/s LVOT VTI:          0.150 m LVOT/AV VTI ratio: 0.54  AORTA Ao Root diam: 3.70 cm Ao Asc diam:  3.77 cm MITRAL VALVE MV Area (PHT): 2.91 cm    SHUNTS MV Decel Time: 261 msec    Systemic VTI:  0.15 m MV E velocity: 79.50 cm/s  Systemic Diam: 2.20 cm Dorothye Gathers MD Electronically signed by Dorothye Gathers MD Signature Date/Time: 10/23/2023/10:01:05 AM    Final    VAS US  ABI WITH/WO TBI Result Date: 10/22/2023  LOWER EXTREMITY DOPPLER STUDY Patient Name:  Jason Spencer  Date of Exam:   10/22/2023 Medical Rec #: [id number]       Accession #:    [id number] Date of Birth: 1951-05-29      Patient Gender: M Patient Age:   57 years Exam Location:  [facility] Procedure:      VAS US  ABI WITH/WO TBI Referring Phys: Authur Leghorn MELVIN --------------------------------------------------------------------------------  Indications: Rest pain, and gangrene. Osteomyelitis High Risk Factors: Hypertension, hyperlipidemia, current smoker, coronary artery                    disease. Other Factors: Atrial flutter.  Vascular Interventions: History of right GSV ablation 2019. Limitations: Today's exam was limited due to Skin texture/Psoraisis, involuntary              patient movement. Comparison Study: No prior study Performing Technologist: Carleene Chase RVS  Examination Guidelines: A complete evaluation includes at minimum, Doppler  waveform signals and systolic blood pressure reading at the level of bilateral brachial, anterior tibial, and posterior tibial arteries, when vessel segments are accessible. Bilateral testing is considered an integral part of a complete examination. Photoelectric Plethysmograph (PPG) waveforms and toe systolic pressure readings are included as required and additional duplex testing as needed. Limited examinations for reoccurring indications may be performed as noted.  ABI Findings: +---------+-----------------+-----+-----------------+-------------------------+ Right    Rt Pressure      IndexWaveform         Comment                            (mmHg)                                                           +---------+-----------------+-----+-----------------+-------------------------+ Brachial 126                   triphasic                                  +---------+-----------------+-----+-----------------+-------------------------+ PTA      254              2.02 dampened  monophasic                                 +---------+-----------------+-----+-----------------+-------------------------+ DP       80               0.63                  audibly dampened                                                          monophasic                +---------+-----------------+-----+-----------------+-------------------------+ Great Toe29               0.23 Abnormal                                   +---------+-----------------+-----+-----------------+-------------------------+ +---------+------------------+-----+-------------------+-------+ Left     Lt Pressure (mmHg)IndexWaveform           Comment +---------+------------------+-----+-------------------+-------+ Brachial 124                    triphasic                  +---------+------------------+-----+-------------------+-------+ PTA      80                 0.63 dampened monophasic        +---------+------------------+-----+-------------------+-------+ DP       254               2.02 dampened monophasic        +---------+------------------+-----+-------------------+-------+ Great Toe0                 0.00 Absent                     +---------+------------------+-----+-------------------+-------+ +-------+---------------------+-----------+------------+------------+ ABI/TBIToday's ABI          Today's TBIPrevious ABIPrevious TBI +-------+---------------------+-----------+------------+------------+ Right  2.02 non compressible0.23                                +-------+---------------------+-----------+------------+------------+ Left   2.02 non compressibleabsent                              +-------+---------------------+-----------+------------+------------+ Arterial wall calcification precludes accurate ankle pressures and ABIs.  Summary: Right: Resting right ankle-brachial index indicates noncompressible right lower extremity arteries. The right toe-brachial index is abnormal. Left: Resting left ankle-brachial index indicates noncompressible left lower extremity arteries. The left toe-brachial index is abnormal. *See table(s) above for measurements and observations.  Electronically signed by Jimmye Moulds MD on 10/22/2023 at 6:35:14 PM.    Final    DG Chest Port 1 View Result Date: 10/22/2023 CLINICAL DATA:  Shortness of breath EXAM: PORTABLE CHEST 1 VIEW COMPARISON:  08/19/2014 FINDINGS: Chronic extrapleural opacity in the right upper chest with probable pleural calcification, this was loculated pleural fluid by 2016 chest CT. The left lung is clear. Normal heart size and mediastinal contours. Artifact from EKG leads. 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 No acute superimposed finding. Electronically Signed   By: Ronnette Coke M.D.    On: 10/22/2023 06:53   CT ANGIO AO+BIFEM W & OR WO CONTRAST Result Date: 10/21/2023 CLINICAL DATA:  Soft tissue infection suspected, left lower leg. EXAM: CT ANGIOGRAPHY OF ABDOMINAL AORTA WITH ILIOFEMORAL RUNOFF TECHNIQUE: Multidetector CT imaging of the abdomen, pelvis and lower extremities was performed using the standard protocol during bolus administration of intravenous contrast. Multiplanar CT image reconstructions and MIPs were obtained to evaluate the vascular anatomy. RADIATION DOSE REDUCTION: This exam was performed according to the departmental dose-optimization program which includes automated exposure control, adjustment of the mA and/or kV according to patient size and/or use of iterative reconstruction technique. CONTRAST:  115mL OMNIPAQUE  IOHEXOL  350 MG/ML SOLN COMPARISON:  None Available. FINDINGS: VASCULAR Aorta: Normal caliber aorta without aneurysm, dissection, vasculitis or significant stenosis. Aortic atherosclerosis. Celiac: Excluded from the field of view. SMA: Excluded from the field of view. Renals: Excluded from the field of view. IMA: Patent. RIGHT Lower Extremity Inflow: There is heavy atherosclerotic calcification of the common iliac arteries bilaterally with severe stenosis bilaterally. No dissection is seen. The external iliac arteries are patent with moderate stenosis on the right. The internal iliac artery is not well evaluated due to heavy calcification. Outflow: Heavy atherosclerotic calcification with multifocal severe stenosis involving in the superficial femoral and popliteal arteries. The common femoral and profunda femoral arteries appear patent Runoff: Heavy atherosclerotic calcification. The distal peroneal, anterior tibial, and posterior tibial arteries appear patent at the level of the ankle, however there is likely severe high-grade stenosis. LEFT Lower Extremity Inflow: Heavy atherosclerotic calcification. There is severe stenosis involving the common and internal  iliac arteries. The internal iliac artery is not well evaluated due to heavy calcification. Outflow: Heavy atherosclerotic calcification with multifocal severe stenosis involving the superficial femoral and popliteal artery. Runoff: Heavy atherosclerotic calcification. The distal calf vessels at the ankle appear patent, however there is likely multifocal high-grade stenosis. Veins: No obvious venous abnormality within the limitations of this arterial phase study. Review of the MIP images confirms the above findings. NON-VASCULAR Lower chest: Excluded from the field of view. Hepatobiliary: Excluded from the field of view. Pancreas: Excluded from the field of view. Spleen: Excluded from the field of view. Adrenals/Urinary Tract: The adrenal glands are not visualized. Hypodensities are noted in the lower pole of the left kidney which are too small to further characterize. The bladder is unremarkable. Stomach/Bowel: Colonic diverticulosis without diverticulitis. No acute abnormality is seen. Lymphatic: No lymphadenopathy by size criteria. Reproductive: Prostate is unremarkable. Other: No abdominopelvic ascites. Musculoskeletal: Degenerative changes are present in the lower lumbar spine. No acute fracture or dislocation. Degenerative changes are noted at the knees bilaterally. Air is identified in the soft tissues of the left foot about the fourth and fifth digits and distal metatarsals. Subcutaneous edema is noted about the distal left lower extremity and foot. No obvious abscess is seen IMPRESSION: VASCULAR 1. Severe multifocal high-grade stenosis involving the bilateral common femoral, superficial femoral, popliteal, and anterior tibial, posterior tibial and peroneal arteries bilaterally. 2. Aortic atherosclerosis. NON-VASCULAR 1. Subcutaneous edema with subcutaneous gas involving in the lateral aspect of the left foot. No obvious abscess is seen. Evaluation for osteomyelitis is limited due to field of view. 2.  Colonic diverticulosis. Electronically Signed   By: Wyvonnia Heimlich M.D.   On: 10/21/2023 16:55   DG Foot Complete Left Result Date: 10/21/2023 CLINICAL DATA:  Black fifth toe EXAM: LEFT FOOT - COMPLETE 3+ VIEW COMPARISON:  None Available. FINDINGS: Hallux valgus deformity. Subcutaneous gas about the head of the fifth metatarsal and proximal phalanx left fifth toe. Cortical loss medially from the fifth metatarsal head. No fracture or dislocation. Mild diffuse osteopenia. Calcaneal spur. Extensive arterial calcifications. IMPRESSION: Subcutaneous gas about the head of the fifth metatarsal and proximal phalanx left fifth toe, with cortical loss medially from the fifth metatarsal head, consistent with osteomyelitis. Electronically Signed   By: Nicoletta Barrier M.D.   On: 10/21/2023 14:11    Microbiology: Results for orders placed or performed during the hospital encounter of 10/21/23  Blood culture (routine x 2)     Status: None   Collection Time: 10/21/23  1:30 PM   Specimen: BLOOD LEFT ARM  Result Value Ref Range Status   Specimen Description BLOOD LEFT ARM  Final   Special Requests   Final    BOTTLES DRAWN AEROBIC AND ANAEROBIC Blood Culture results may not be optimal due to an inadequate volume of blood received in culture bottles   Culture   Final    NO GROWTH 5 DAYS Performed at [facility], [address] [place], [place] [address]    Report Status 10/26/2023 FINAL  Final  Blood culture (routine x 2)     Status: None   Collection Time: 10/21/23  2:24 PM   Specimen: BLOOD  Result Value Ref Range Status   Specimen Description BLOOD RIGHT ANTECUBITAL  Final   Special Requests   Final    BOTTLES DRAWN AEROBIC AND ANAEROBIC Blood Culture adequate volume   Culture   Final    NO GROWTH 5 DAYS Performed at [facility], [address], [place], [place] [address]    Report Status 10/26/2023 FINAL  Final  MRSA Next Gen by PCR, Nasal     Status: None   Collection Time: 10/21/23  9:15  PM   Specimen: Nasal Mucosa; Nasal Swab  Result Value Ref Range Status   MRSA by PCR Next Gen NOT DETECTED NOT DETECTED Final    Comment: (NOTE) The GeneXpert MRSA Assay (FDA approved for NASAL specimens only), is one component of a comprehensive MRSA colonization surveillance program. It is not intended to diagnose MRSA infection nor to guide or monitor treatment for MRSA infections. Test performance is not FDA approved in patients less than 19 years old. Performed at [facility], [address], [place], [place] [address]     Labs: CBC: Recent Labs  Lab 11/02/23 0654 11/03/23 0723 11/03/23 1802 11/04/23 0517 11/05/23 0439 11/06/23 0545  WBC 16.3* 10.5  --  9.3 10.1 13.7*  HGB 7.5* 7.5* 9.0* 8.4* 8.5* 8.4*  HCT 24.3* 23.8* 27.8* 25.6* 25.8* 25.7*  MCV 97.2 94.8  --  92.8 92.1 92.1  PLT 257 246  --  241 234 256   Basic Metabolic Panel: Recent Labs  Lab 10/31/23 0525 11/01/23 0429 11/02/23 0654 11/03/23 0723 11/05/23 0439 11/06/23 0545  NA 136 135 135 137 135 135  K 4.0 4.0 3.9 3.9 3.7 4.3  CL 103 103 104 106 101 103  CO2 22 19* 22 24 24 25   GLUCOSE 88 167* 102* 87 93 100*  BUN 12 21 26* 27* 10 12  CREATININE 1.18 1.37* 1.10 1.09 0.85 0.96  CALCIUM  8.5* 8.6* 8.0* 8.4* 8.2* 8.2*  MG 2.0 2.1 2.0 1.9 2.0 1.9  PHOS 3.7 2.9 3.1 2.5  --   --  Liver Function Tests: Recent Labs  Lab 11/01/23 0429 11/02/23 0654 11/03/23 0723 11/05/23 0439 11/06/23 0545  AST  --   --   --  24 20  ALT  --   --   --  23 21  ALKPHOS  --   --   --  38 38  BILITOT  --   --   --  0.8 0.9  PROT  --   --   --  6.2* 6.2*  ALBUMIN 2.3* 2.2* 2.2* 2.3* 2.3*   CBG: No results for input(s): "GLUCAP" in the last 168 hours.  Discharge time spent: greater than 30 minutes.  Signed: Cortny Bambach , MD [facility] 11/06/2023

## 2023-11-06 NOTE — TOC Transition Note (Signed)
 Transition of Care [facility]) - Discharge Note   Patient Details  Name: Jason Spencer MRN: [id number] Date of Birth: 1950-09-25  Transition of [facility]) CM/SW Contact:  Autrey Human A [place], LCSW Phone Number: 11/06/2023, 9:06 AM   Clinical Narrative:     Patient will DC to: [facility]  Anticipated DC date: 11/06/23  Family notified: Pt declined collateral contact  Transport by: Lyna Sandhoff    Authorization Reference ID: [id number]   Approval Dates:  11/05/2023-11/07/2023   Per MD patient ready for DC to [facility]. RN, patient, patient's family, and facility notified of DC. Discharge Summary and FL2 sent to facility. RN to call report prior to discharge [phone], room 308). DC packet on chart. Ambulance transport requested for patient.     CSW will sign off for now as social work intervention is no longer needed. Please consult us  again if new needs arise.   Final next level of care: Skilled Nursing Facility Barriers to Discharge: Barriers Resolved   Patient Goals and CMS Choice            Discharge Placement              Patient chooses bed at: [facility] Patient to be transferred to facility by: [facility] Name of family member notified: Pt declined  Patient and family notified of of transfer: 11/06/23  Discharge Plan and Services Additional resources added to the After Visit Summary for                                       Social Drivers of Health (SDOH) Interventions SDOH Screenings   Food Insecurity: No Food Insecurity (10/21/2023)  Housing: Low Risk  (10/21/2023)  Transportation Needs: No Transportation Needs (10/21/2023)  Utilities: Not At Risk (10/21/2023)  Depression (PHQ2-9): Low Risk  (07/06/2023)  Financial Resource Strain: Low Risk  (01/08/2023)  Physical Activity: Insufficiently Active (01/08/2023)  Social Connections: Moderately Integrated (10/22/2023)  Tobacco Use: High Risk (10/31/2023)     Readmission Risk Interventions     No data  to display

## 2023-11-06 NOTE — Progress Notes (Signed)
 Called [facility] and spoke to [place] to give report.

## 2023-11-06 NOTE — Progress Notes (Signed)
 Mobility Specialist: Progress Note   11/06/23 1001  Mobility  Activity Ambulated with assistance in room  Level of Assistance Minimal assist, patient does 75% or more  Assistive Device Front wheel walker  Distance Ambulated (ft) 15 ft  LLE Weight Bearing Per Provider Order NWB  Activity Response Tolerated well  Mobility Referral Yes  Mobility visit 1 Mobility  Mobility Specialist Start Time (ACUTE ONLY) [id number]  Mobility Specialist Stop Time (ACUTE ONLY) 0850  Mobility Specialist Time Calculation (min) (ACUTE ONLY) 17 min    Pt was agreeable to mobility session - received in bed. Ind for bed mobility. Heavy modA for STS. MinA for ambulation with chair follow. C/o throbbing pain in LLE. Fatigued quickly. Took a seated rest and rolled back to bedside in recline. Then performed stand pivot with minA to bed. Left in bed with all needs met, call bell in reach.   Deloria Fetch Mobility Specialist Please contact via SecureChat or Rehab office at [phone]

## 2023-11-07 DIAGNOSIS — I429 Cardiomyopathy, unspecified: Secondary | ICD-10-CM | POA: Diagnosis not present

## 2023-11-07 DIAGNOSIS — I700 Atherosclerosis of aorta: Secondary | ICD-10-CM | POA: Diagnosis not present

## 2023-11-07 DIAGNOSIS — I998 Other disorder of circulatory system: Secondary | ICD-10-CM | POA: Diagnosis not present

## 2023-11-07 DIAGNOSIS — D692 Other nonthrombocytopenic purpura: Secondary | ICD-10-CM | POA: Diagnosis not present

## 2023-11-07 DIAGNOSIS — I739 Peripheral vascular disease, unspecified: Secondary | ICD-10-CM | POA: Diagnosis not present

## 2023-11-07 DIAGNOSIS — M86172 Other acute osteomyelitis, left ankle and foot: Secondary | ICD-10-CM | POA: Diagnosis not present

## 2023-11-07 DIAGNOSIS — I4892 Unspecified atrial flutter: Secondary | ICD-10-CM | POA: Diagnosis not present

## 2023-11-07 DIAGNOSIS — M869 Osteomyelitis, unspecified: Secondary | ICD-10-CM | POA: Diagnosis not present

## 2023-11-07 DIAGNOSIS — Z89432 Acquired absence of left foot: Secondary | ICD-10-CM | POA: Diagnosis not present

## 2023-11-08 DIAGNOSIS — Z79899 Other long term (current) drug therapy: Secondary | ICD-10-CM | POA: Diagnosis not present

## 2023-11-08 DIAGNOSIS — I96 Gangrene, not elsewhere classified: Secondary | ICD-10-CM | POA: Diagnosis not present

## 2023-11-08 DIAGNOSIS — K2990 Gastroduodenitis, unspecified, without bleeding: Secondary | ICD-10-CM | POA: Diagnosis not present

## 2023-11-08 DIAGNOSIS — M869 Osteomyelitis, unspecified: Secondary | ICD-10-CM | POA: Diagnosis not present

## 2023-11-08 DIAGNOSIS — I998 Other disorder of circulatory system: Secondary | ICD-10-CM | POA: Diagnosis not present

## 2023-11-08 DIAGNOSIS — R52 Pain, unspecified: Secondary | ICD-10-CM | POA: Diagnosis not present

## 2023-11-09 ENCOUNTER — Telehealth: Payer: Self-pay | Admitting: Radiology

## 2023-11-09 NOTE — Telephone Encounter (Signed)
 Chipper Council, RN from [facility] called in regards to patients dressing. 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 I advised usual protocol for all surgeries is to original dressing on until first post op visit. Unless dressing becomes overly saturated or comes off, then replace with clean dry dressing.   Patient has first post op on Wed. 5/7 with [place] [facility]

## 2023-11-11 NOTE — Addendum Note (Signed)
 Addendum  created 11/11/23 2126 by Gorman Laughter, MD   Clinical Note Signed, Intraprocedure Blocks edited

## 2023-11-12 DIAGNOSIS — I998 Other disorder of circulatory system: Secondary | ICD-10-CM | POA: Diagnosis not present

## 2023-11-12 DIAGNOSIS — I739 Peripheral vascular disease, unspecified: Secondary | ICD-10-CM | POA: Diagnosis not present

## 2023-11-12 DIAGNOSIS — Z79899 Other long term (current) drug therapy: Secondary | ICD-10-CM | POA: Diagnosis not present

## 2023-11-12 DIAGNOSIS — Z5181 Encounter for therapeutic drug level monitoring: Secondary | ICD-10-CM | POA: Diagnosis not present

## 2023-11-13 DIAGNOSIS — Z7189 Other specified counseling: Secondary | ICD-10-CM | POA: Diagnosis not present

## 2023-11-13 DIAGNOSIS — Z89432 Acquired absence of left foot: Secondary | ICD-10-CM | POA: Diagnosis not present

## 2023-11-13 DIAGNOSIS — I2510 Atherosclerotic heart disease of native coronary artery without angina pectoris: Secondary | ICD-10-CM | POA: Diagnosis not present

## 2023-11-13 DIAGNOSIS — E785 Hyperlipidemia, unspecified: Secondary | ICD-10-CM | POA: Diagnosis not present

## 2023-11-13 DIAGNOSIS — Z79899 Other long term (current) drug therapy: Secondary | ICD-10-CM | POA: Diagnosis not present

## 2023-11-13 DIAGNOSIS — I739 Peripheral vascular disease, unspecified: Secondary | ICD-10-CM | POA: Diagnosis not present

## 2023-11-13 DIAGNOSIS — I10 Essential (primary) hypertension: Secondary | ICD-10-CM | POA: Diagnosis not present

## 2023-11-14 ENCOUNTER — Ambulatory Visit (INDEPENDENT_AMBULATORY_CARE_PROVIDER_SITE_OTHER): Admitting: Family

## 2023-11-14 ENCOUNTER — Encounter: Payer: Self-pay | Admitting: Family

## 2023-11-14 DIAGNOSIS — I998 Other disorder of circulatory system: Secondary | ICD-10-CM

## 2023-11-14 DIAGNOSIS — I96 Gangrene, not elsewhere classified: Secondary | ICD-10-CM

## 2023-11-14 MED ORDER — HYDROCODONE-ACETAMINOPHEN 5-325 MG PO TABS: 1.0000 | ORAL_TABLET | ORAL | 0 refills | Status: DC | PRN

## 2023-11-14 NOTE — Progress Notes (Signed)
 Office Visit Note   Patient: Jason Spencer           Date of Birth: 1951-06-20           MRN: [id number] Visit Date: 11/14/2023              Requested by: Yevette Hem, FNP [address] [place],  [place] [address] PCP: Yevette Hem, FNP  Chief Complaint  Patient presents with   Left Foot - Routine Post Op    10/31/2023 left TMA      HPI: The patient is a 74 year old gentleman who is seen status post transmetatarsal amputation on April 23.  Assessment & Plan: Visit Diagnoses: No diagnosis found.  Plan: Begin daily dose of cleansing.  Dry dressings.  May touchdown weight-bear through the heel for transfers only  Follow-Up Instructions: No follow-ups on file.   Ortho Exam  Patient is alert, oriented, no adenopathy, well-dressed, normal affect, normal respiratory effort. On examination left foot the incisions well-approximated sutures there is no dehiscence no surrounding erythema no drainage no warmth Imaging: No results found. No images are attached to the encounter.  Labs: Lab Results  Component Value Date   HGBA1C 5.5 04/27/2016   HGBA1C 6.7 (H) 08/19/2014   ESRSEDRATE 140 (H) 10/27/2023   CRP 13.6 (H) 10/27/2023   CRP 8.8 (H) 10/24/2023   CRP 9.9 (H) 10/23/2023   REPTSTATUS 10/26/2023 FINAL 10/21/2023   GRAMSTAIN  08/14/2014    RARE WBC PRESENT,BOTH PMN AND MONONUCLEAR NO ORGANISMS SEEN Performed at [facility]    CULT  10/21/2023    NO GROWTH 5 DAYS Performed at [facility], [address] [place], [place] [address]      Lab Results  Component Value Date   ALBUMIN 2.3 (L) 11/06/2023   ALBUMIN 2.3 (L) 11/05/2023   ALBUMIN 2.2 (L) 11/03/2023    Lab Results  Component Value Date   MG 1.9 11/06/2023   MG 2.0 11/05/2023   MG 1.9 11/03/2023   No results found for: "VD25OH"  No results found for: "PREALBUMIN"    Latest Ref Rng & Units 11/06/2023    5:45 AM 11/05/2023    4:39 AM 11/04/2023    5:17 AM  CBC EXTENDED   WBC 4.0 - 10.5 K/uL 13.7  10.1  9.3   RBC 4.22 - 5.81 MIL/uL 2.79  2.80  2.76   Hemoglobin 13.0 - 17.0 g/dL 8.4  8.5  8.4   HCT 82.9 - 52.0 % 25.7  25.8  25.6   Platelets 150 - 400 K/uL 256  234  241      There is no height or weight on file to calculate BMI.  Orders:  No orders of the defined types were placed in this encounter.  No orders of the defined types were placed in this encounter.    Procedures: No procedures performed  Clinical Data: No additional findings.  ROS:  All other systems negative, except as noted in the HPI. Review of Systems  Objective: Vital Signs: There were no vitals taken for this visit.  Specialty Comments:  No specialty comments available.  PMFS History: Patient Active Problem List   Diagnosis Date Noted   Heme positive stool 10/30/2023   Acute blood loss anemia 10/30/2023   Gastritis and gastroduodenitis 10/30/2023   Candida esophagitis (HCC) 10/30/2023   Acute osteomyelitis of toe of left foot (HCC) 10/26/2023   PAD (peripheral artery disease) (HCC) 10/23/2023   Osteomyelitis (HCC) 10/21/2023  Gangrene of left foot (HCC) 10/21/2023   Limb ischemia 10/21/2023   Atherosclerosis of aorta (HCC) 03/02/2022   Purpura senilis (HCC) 01/30/2022   Atrial flutter (HCC) 07/28/2019   Cardiomyopathy (HCC) 07/28/2019   Alcohol abuse 07/28/2019   Prolonged QT interval 05/19/2019   GAD (generalized anxiety disorder) 05/01/2019   COPD (chronic obstructive pulmonary disease) (HCC) 09/26/2018   Current use of anticoagulant therapy 09/12/2018   Hyperlipidemia 01/04/2017   Pre-diabetes 05/24/2016   Essential hypertension, benign 04/27/2016   History of prolonged Q-T interval on ECG 04/01/2015   CAD (coronary artery disease) 02/17/2015   Tobacco abuse    Hypokalemia    Loculated pleural effusion 08/15/2014   S/P thoracentesis    Acute on chronic systolic CHF (congestive heart failure) (HCC)    Past Medical History:  Diagnosis Date   Acute  CHF (HCC)    Maximo Spar 08/13/2014   Atrial fibrillation with RVR (HCC) 08/13/2014   new onset/notes 08/13/2014   CAP (community acquired pneumonia) 07/2014   Cardiomyopathy (HCC)    Migraine 1970     Family History  Problem Relation Age of Onset   Heart disease Mother        VALUE REPLACEMENT   COPD Father    Aneurysm Father     Past Surgical History:  Procedure Laterality Date   BIOPSY OF SKIN SUBCUTANEOUS TISSUE AND/OR MUCOUS MEMBRANE  10/30/2023   Procedure: BIOPSY, SKIN, SUBCUTANEOUS TISSUE, OR MUCOUS MEMBRANE;  Surgeon: Sergio Dandy, MD;  Location: [facility] ENDOSCOPY;  Service: Gastroenterology;;   CARDIOVERSION N/A 08/19/2014   Procedure: CARDIOVERSION;  Surgeon: Darlis Eisenmenger, MD;  Location: [facility] ENDOSCOPY;  Service: Cardiovascular;  Laterality: N/A;   ENDOVENOUS ABLATION SAPHENOUS VEIN W/ LASER Right 05/01/2018   endovenous laser ablation right greater saphenous vein by Stacia Dynes MD    ESOPHAGOGASTRODUODENOSCOPY N/A 10/30/2023   Procedure: EGD (ESOPHAGOGASTRODUODENOSCOPY);  Surgeon: Nandigam, Kavitha V, MD;  Location: [facility] ENDOSCOPY;  Service: Gastroenterology;  Laterality: N/A;   LEFT AND RIGHT HEART CATHETERIZATION WITH CORONARY ANGIOGRAM N/A 08/17/2014   Procedure: LEFT AND RIGHT HEART CATHETERIZATION WITH CORONARY ANGIOGRAM;  Surgeon: Mickiel Albany, MD;  Location: [facility] CATH LAB;  Service: Cardiovascular;  Laterality: N/A;   LOWER EXTREMITY ANGIOGRAPHY Left 10/25/2023   Procedure: Lower Extremity Angiography;  Surgeon: Young Hensen, MD;  Location: [facility] INVASIVE CV LAB;  Service: Cardiovascular;  Laterality: Left;   LOWER EXTREMITY INTERVENTION Left 10/25/2023   Procedure: LOWER EXTREMITY INTERVENTION;  Surgeon: Young Hensen, MD;  Location: [facility] INVASIVE CV LAB;  Service: Cardiovascular;  Laterality: Left;   NO PAST SURGERIES     PERCUTANEOUS CORONARY STENT INTERVENTION (PCI-S)  08/17/2014   Procedure: PERCUTANEOUS CORONARY STENT INTERVENTION (PCI-S);  Surgeon: Mickiel Albany, MD;  Location: [facility] CATH LAB;  Service: Cardiovascular;;   TEE WITHOUT CARDIOVERSION N/A 08/19/2014   Procedure: TRANSESOPHAGEAL ECHOCARDIOGRAM (TEE);  Surgeon: Darlis Eisenmenger, MD;  Location: [facility] ENDOSCOPY;  Service: Cardiovascular;  Laterality: N/A;   TRANSMETATARSAL AMPUTATION Left 10/31/2023   Procedure: AMPUTATION, FOOT, TRANSMETATARSAL;  Surgeon: Timothy Ford, MD;  Location: [facility] OR;  Service: Orthopedics;  Laterality: Left;   Social History   Occupational History   Not on file  Tobacco Use   Smoking status: Every Day    Current packs/day: 0.50    Average packs/day: 0.5 packs/day for 44.0 years (22.0 ttl pk-yrs)    Types: Cigarettes   Smokeless tobacco: Never  Vaping Use   Vaping status: Never Used  Substance and  Sexual Activity   Alcohol use: Yes    Alcohol/week: 0.0 standard drinks of alcohol    Comment: 08/13/2014 "I haven't drank in a month or so; if I drink it would be beer on a warm day"   Drug use: No   Sexual activity: Not Currently

## 2023-11-14 NOTE — Addendum Note (Signed)
 Addended by: Alanya Vukelich R on: 11/14/2023 09:57 AM   Modules accepted: Orders

## 2023-11-15 DIAGNOSIS — R52 Pain, unspecified: Secondary | ICD-10-CM | POA: Diagnosis not present

## 2023-11-15 DIAGNOSIS — J449 Chronic obstructive pulmonary disease, unspecified: Secondary | ICD-10-CM | POA: Diagnosis not present

## 2023-11-15 DIAGNOSIS — Z79899 Other long term (current) drug therapy: Secondary | ICD-10-CM | POA: Diagnosis not present

## 2023-11-15 DIAGNOSIS — Z89432 Acquired absence of left foot: Secondary | ICD-10-CM | POA: Diagnosis not present

## 2023-11-15 DIAGNOSIS — F411 Generalized anxiety disorder: Secondary | ICD-10-CM | POA: Diagnosis not present

## 2023-11-19 DIAGNOSIS — E559 Vitamin D deficiency, unspecified: Secondary | ICD-10-CM | POA: Diagnosis not present

## 2023-11-19 DIAGNOSIS — I4892 Unspecified atrial flutter: Secondary | ICD-10-CM | POA: Diagnosis not present

## 2023-11-19 DIAGNOSIS — I2510 Atherosclerotic heart disease of native coronary artery without angina pectoris: Secondary | ICD-10-CM | POA: Diagnosis not present

## 2023-11-19 DIAGNOSIS — Z5181 Encounter for therapeutic drug level monitoring: Secondary | ICD-10-CM | POA: Diagnosis not present

## 2023-11-19 DIAGNOSIS — Z89432 Acquired absence of left foot: Secondary | ICD-10-CM | POA: Diagnosis not present

## 2023-11-20 DIAGNOSIS — R52 Pain, unspecified: Secondary | ICD-10-CM | POA: Diagnosis not present

## 2023-11-20 DIAGNOSIS — Z79899 Other long term (current) drug therapy: Secondary | ICD-10-CM | POA: Diagnosis not present

## 2023-11-20 DIAGNOSIS — Z89432 Acquired absence of left foot: Secondary | ICD-10-CM | POA: Diagnosis not present

## 2023-11-26 DIAGNOSIS — I2510 Atherosclerotic heart disease of native coronary artery without angina pectoris: Secondary | ICD-10-CM | POA: Diagnosis not present

## 2023-11-26 DIAGNOSIS — Z79899 Other long term (current) drug therapy: Secondary | ICD-10-CM | POA: Diagnosis not present

## 2023-11-26 DIAGNOSIS — Z5181 Encounter for therapeutic drug level monitoring: Secondary | ICD-10-CM | POA: Diagnosis not present

## 2023-11-27 ENCOUNTER — Other Ambulatory Visit: Payer: Self-pay | Admitting: *Deleted

## 2023-11-27 DIAGNOSIS — I739 Peripheral vascular disease, unspecified: Secondary | ICD-10-CM

## 2023-11-28 ENCOUNTER — Ambulatory Visit (INDEPENDENT_AMBULATORY_CARE_PROVIDER_SITE_OTHER): Admitting: Family

## 2023-11-28 ENCOUNTER — Encounter: Payer: Self-pay | Admitting: Family

## 2023-11-28 DIAGNOSIS — L89620 Pressure ulcer of left heel, unstageable: Secondary | ICD-10-CM

## 2023-11-28 DIAGNOSIS — Z89432 Acquired absence of left foot: Secondary | ICD-10-CM

## 2023-11-28 NOTE — Progress Notes (Signed)
 Post-Op Visit Note   Patient: Jason Spencer           Date of Birth: [birth year]           MRN: [id number] Visit Date: 11/28/2023 PCP: Yevette Hem, FNP  Chief Complaint:  Chief Complaint  Patient presents with   Left Foot - Routine Post Op    10/31/2023 Left TMA     HPI:  HPI The patient is a 73 year old gentleman who is seen status post transmetatarsal amputation on the left he is currently residing at skilled nursing.  He reports an area of yellow drainage in his incision reports has begun taking oral antibiotics as prescribed by skilled nursing for concern of infection  He is also concerned about some pain and ulceration to his posterior heel  Ortho Exam On examination left transmetatarsal amputation sutures are in place the majority of this is well-healed there is 1 remaining open area which is dime size this is filled in with fibrinous exudative tissue there is very mild surrounding erythema no sign of infection to the posterior heel he also has a deep tissue injury there is central eschar, unstageable.  There is no maceration no drainage ulceration measuring 15 mm in diameter  Visit Diagnoses: No diagnosis found.  Plan: Discussed the importance of offloading the heel he may begin touchdown weightbearing on the left continue postop shoe continue daily Dial soap cleansing and dry dressings he will complete his antibiotics as prescribed.  Plan to follow-up in 2 more weeks  Consider order for custom orthotic with a spacer at that time Follow-Up Instructions: No follow-ups on file.   Imaging: No results found.  Orders:  No orders of the defined types were placed in this encounter.  No orders of the defined types were placed in this encounter.    PMFS History: Patient Active Problem List   Diagnosis Date Noted   Heme positive stool 10/30/2023   Acute blood loss anemia 10/30/2023   Gastritis and gastroduodenitis 10/30/2023   Candida esophagitis (HCC) 10/30/2023    Acute osteomyelitis of toe of left foot (HCC) 10/26/2023   PAD (peripheral artery disease) (HCC) 10/23/2023   Osteomyelitis (HCC) 10/21/2023   Gangrene of left foot (HCC) 10/21/2023   Limb ischemia 10/21/2023   Atherosclerosis of aorta (HCC) 03/02/2022   Purpura senilis (HCC) 01/30/2022   Atrial flutter (HCC) 07/28/2019   Cardiomyopathy (HCC) 07/28/2019   Alcohol abuse 07/28/2019   Prolonged QT interval 05/19/2019   GAD (generalized anxiety disorder) 05/01/2019   COPD (chronic obstructive pulmonary disease) (HCC) 09/26/2018   Current use of anticoagulant therapy 09/12/2018   Hyperlipidemia 01/04/2017   Pre-diabetes 05/24/2016   Essential hypertension, benign 04/27/2016   History of prolonged Q-T interval on ECG 04/01/2015   CAD (coronary artery disease) 02/17/2015   Tobacco abuse    Hypokalemia    Loculated pleural effusion 08/15/2014   S/P thoracentesis    Acute on chronic systolic CHF (congestive heart failure) (HCC)    Past Medical History:  Diagnosis Date   Acute CHF (HCC)    Maximo Spar 08/13/2014   Atrial fibrillation with RVR (HCC) 08/13/2014   new onset/notes 08/13/2014   CAP (community acquired pneumonia) 07/2014   Cardiomyopathy (HCC)    Migraine 1970     Family History  Problem Relation Age of Onset   Heart disease Mother        VALUE REPLACEMENT   COPD Father    Aneurysm Father     Past Surgical History:  Procedure Laterality Date   BIOPSY OF SKIN SUBCUTANEOUS TISSUE AND/OR MUCOUS MEMBRANE  10/30/2023   Procedure: BIOPSY, SKIN, SUBCUTANEOUS TISSUE, OR MUCOUS MEMBRANE;  Surgeon: Sergio Dandy, MD;  [facility]: [facility] ENDOSCOPY;  Service: Gastroenterology;;   CARDIOVERSION N/A 08/19/2014   Procedure: CARDIOVERSION;  Surgeon: Darlis Eisenmenger, MD;  Location: [facility] ENDOSCOPY;  Service: Cardiovascular;  Laterality: N/A;   ENDOVENOUS ABLATION SAPHENOUS VEIN W/ LASER Right 05/01/2018   endovenous laser ablation right greater saphenous vein by Stacia Dynes MD     ESOPHAGOGASTRODUODENOSCOPY N/A 10/30/2023   Procedure: EGD (ESOPHAGOGASTRODUODENOSCOPY);  Surgeon: Nandigam, Kavitha V, MD;  Location: [facility] ENDOSCOPY;  Service: Gastroenterology;  Laterality: N/A;   LEFT AND RIGHT HEART CATHETERIZATION WITH CORONARY ANGIOGRAM N/A 08/17/2014   Procedure: LEFT AND RIGHT HEART CATHETERIZATION WITH CORONARY ANGIOGRAM;  Surgeon: Mickiel Albany, MD;  Location: [facility] CATH LAB;  Service: Cardiovascular;  Laterality: N/A;   LOWER EXTREMITY ANGIOGRAPHY Left 10/25/2023   Procedure: Lower Extremity Angiography;  Surgeon: Young Hensen, MD;  Location: [facility] INVASIVE CV LAB;  Service: Cardiovascular;  Laterality: Left;   LOWER EXTREMITY INTERVENTION Left 10/25/2023   Procedure: LOWER EXTREMITY INTERVENTION;  Surgeon: Young Hensen, MD;  [facility] INVASIVE CV LAB;  Service: Cardiovascular;  Laterality: Left;   NO PAST SURGERIES     PERCUTANEOUS CORONARY STENT INTERVENTION (PCI-S)  08/17/2014   Procedure: PERCUTANEOUS CORONARY STENT INTERVENTION (PCI-S);  Surgeon: Mickiel Albany, MD;  Location[facility] CATH LAB;  Service: Cardiovascular;;   TEE WITHOUT CARDIOVERSION N/A 08/19/2014   Procedure: TRANSESOPHAGEAL ECHOCARDIOGRAM (TEE);  Surgeon: Darlis Eisenmenger, MD;  Location: [facility] ENDOSCOPY;  Service: Cardiovascular;  Laterality: N/A;   TRANSMETATARSAL AMPUTATION Left 10/31/2023   Procedure: AMPUTATION, FOOT, TRANSMETATARSAL;  Surgeon: Timothy Ford, MD;  Location: [facility] OR;  Service: Orthopedics;  Laterality: Left;   Social History   Occupational History   Not on file  Tobacco Use   Smoking status: Every Day    Current packs/day: 0.50    Average packs/day: 0.5 packs/day for 44.0 years (22.0 ttl pk-yrs)    Types: Cigarettes   Smokeless tobacco: Never  Vaping Use   Vaping status: Never Used  Substance and Sexual Activity   Alcohol use: Yes    Alcohol/week: 0.0 standard drinks of alcohol    Comment: 08/13/2014 "I haven't drank in a month or so; if I drink it would be beer on a  warm day"   Drug use: No   Sexual activity: Not Currently

## 2023-12-04 DIAGNOSIS — I2510 Atherosclerotic heart disease of native coronary artery without angina pectoris: Secondary | ICD-10-CM | POA: Diagnosis not present

## 2023-12-04 DIAGNOSIS — Z79899 Other long term (current) drug therapy: Secondary | ICD-10-CM | POA: Diagnosis not present

## 2023-12-04 DIAGNOSIS — I4892 Unspecified atrial flutter: Secondary | ICD-10-CM | POA: Diagnosis not present

## 2023-12-04 DIAGNOSIS — Z5181 Encounter for therapeutic drug level monitoring: Secondary | ICD-10-CM | POA: Diagnosis not present

## 2023-12-05 DIAGNOSIS — K2990 Gastroduodenitis, unspecified, without bleeding: Secondary | ICD-10-CM | POA: Diagnosis not present

## 2023-12-05 DIAGNOSIS — I96 Gangrene, not elsewhere classified: Secondary | ICD-10-CM | POA: Diagnosis not present

## 2023-12-05 DIAGNOSIS — I4892 Unspecified atrial flutter: Secondary | ICD-10-CM | POA: Diagnosis not present

## 2023-12-05 DIAGNOSIS — Z89432 Acquired absence of left foot: Secondary | ICD-10-CM | POA: Diagnosis not present

## 2023-12-05 DIAGNOSIS — M86172 Other acute osteomyelitis, left ankle and foot: Secondary | ICD-10-CM | POA: Diagnosis not present

## 2023-12-05 DIAGNOSIS — J449 Chronic obstructive pulmonary disease, unspecified: Secondary | ICD-10-CM | POA: Diagnosis not present

## 2023-12-05 DIAGNOSIS — F411 Generalized anxiety disorder: Secondary | ICD-10-CM | POA: Diagnosis not present

## 2023-12-05 DIAGNOSIS — E785 Hyperlipidemia, unspecified: Secondary | ICD-10-CM | POA: Diagnosis not present

## 2023-12-05 DIAGNOSIS — E559 Vitamin D deficiency, unspecified: Secondary | ICD-10-CM | POA: Diagnosis not present

## 2023-12-06 ENCOUNTER — Ambulatory Visit (HOSPITAL_COMMUNITY): Admit: 2023-12-06

## 2023-12-06 ENCOUNTER — Ambulatory Visit

## 2023-12-06 DIAGNOSIS — Z79899 Other long term (current) drug therapy: Secondary | ICD-10-CM | POA: Diagnosis not present

## 2023-12-06 DIAGNOSIS — R52 Pain, unspecified: Secondary | ICD-10-CM | POA: Diagnosis not present

## 2023-12-06 DIAGNOSIS — M86172 Other acute osteomyelitis, left ankle and foot: Secondary | ICD-10-CM | POA: Diagnosis not present

## 2023-12-06 DIAGNOSIS — Z89432 Acquired absence of left foot: Secondary | ICD-10-CM | POA: Diagnosis not present

## 2023-12-06 DIAGNOSIS — I96 Gangrene, not elsewhere classified: Secondary | ICD-10-CM | POA: Diagnosis not present

## 2023-12-06 DIAGNOSIS — I998 Other disorder of circulatory system: Secondary | ICD-10-CM | POA: Diagnosis not present

## 2023-12-11 DIAGNOSIS — I4892 Unspecified atrial flutter: Secondary | ICD-10-CM | POA: Diagnosis not present

## 2023-12-11 DIAGNOSIS — Z5181 Encounter for therapeutic drug level monitoring: Secondary | ICD-10-CM | POA: Diagnosis not present

## 2023-12-11 DIAGNOSIS — I2510 Atherosclerotic heart disease of native coronary artery without angina pectoris: Secondary | ICD-10-CM | POA: Diagnosis not present

## 2023-12-12 ENCOUNTER — Encounter: Admitting: Family

## 2023-12-14 DIAGNOSIS — Z79899 Other long term (current) drug therapy: Secondary | ICD-10-CM | POA: Diagnosis not present

## 2023-12-14 DIAGNOSIS — Z89432 Acquired absence of left foot: Secondary | ICD-10-CM | POA: Diagnosis not present

## 2023-12-14 DIAGNOSIS — L089 Local infection of the skin and subcutaneous tissue, unspecified: Secondary | ICD-10-CM | POA: Diagnosis not present

## 2023-12-14 DIAGNOSIS — T8149XA Infection following a procedure, other surgical site, initial encounter: Secondary | ICD-10-CM | POA: Diagnosis not present

## 2023-12-17 DIAGNOSIS — Z79899 Other long term (current) drug therapy: Secondary | ICD-10-CM | POA: Diagnosis not present

## 2023-12-17 DIAGNOSIS — L089 Local infection of the skin and subcutaneous tissue, unspecified: Secondary | ICD-10-CM | POA: Diagnosis not present

## 2023-12-18 ENCOUNTER — Ambulatory Visit (INDEPENDENT_AMBULATORY_CARE_PROVIDER_SITE_OTHER): Admitting: Physician Assistant

## 2023-12-18 ENCOUNTER — Encounter: Payer: Self-pay | Admitting: Physician Assistant

## 2023-12-18 DIAGNOSIS — M8600 Acute hematogenous osteomyelitis, unspecified site: Secondary | ICD-10-CM

## 2023-12-18 DIAGNOSIS — I2510 Atherosclerotic heart disease of native coronary artery without angina pectoris: Secondary | ICD-10-CM | POA: Diagnosis not present

## 2023-12-18 DIAGNOSIS — Z5181 Encounter for therapeutic drug level monitoring: Secondary | ICD-10-CM | POA: Diagnosis not present

## 2023-12-18 DIAGNOSIS — Z89432 Acquired absence of left foot: Secondary | ICD-10-CM | POA: Diagnosis not present

## 2023-12-18 DIAGNOSIS — Z79899 Other long term (current) drug therapy: Secondary | ICD-10-CM | POA: Diagnosis not present

## 2023-12-18 DIAGNOSIS — R52 Pain, unspecified: Secondary | ICD-10-CM | POA: Diagnosis not present

## 2023-12-18 NOTE — Progress Notes (Signed)
 Office Visit Note   Patient: Jason Spencer           Date of Birth: [birth year]           MRN: [id number] Visit Date: 12/18/2023              Requested by: Yevette Hem, FNP [address] [place],  [place] [address] PCP: Yevette Hem, FNP      HPI: Patient is status post transmetatarsal amputation approximately 6 to 7 weeks ago by Dr. Julio Ohm.  Here for follow-up.  Assessment & Plan: Visit Diagnoses:  1. Acute hematogenous osteomyelitis, unspecified site [facility])     Plan: Patient has improved from previous exam he has 1 very small spot of approximately 1 cm and 1 mm deep of healthy exudative tissue on the incision.  No evidence of infection.  She continue with daily cleansing we will follow-up with Erin in 3 weeks could consider custom shoe at that time  Follow-Up Instructions: No follow-ups on file.   Ortho Exam  Patient is alert, oriented, no adenopathy, well-dressed, normal affect, normal respiratory effort. Emanation of his left foot he is neurovascular intact foot is warm no ascending cellulitis incision is actually healing quite well just small area of healing exudative tissue laterally but healthy exudative tissue in the small area of dehiscence minimal drainage no foul odor    Imaging: No results found. No images are attached to the encounter.  Labs: Lab Results  Component Value Date   HGBA1C 5.5 04/27/2016   HGBA1C 6.7 (H) 08/19/2014   ESRSEDRATE 140 (H) 10/27/2023   CRP 13.6 (H) 10/27/2023   CRP 8.8 (H) 10/24/2023   CRP 9.9 (H) 10/23/2023   REPTSTATUS 10/26/2023 FINAL 10/21/2023   GRAMSTAIN  08/14/2014    RARE WBC PRESENT,BOTH PMN AND MONONUCLEAR NO ORGANISMS SEEN Performed at [facility]    CULT  10/21/2023    NO GROWTH 5 DAYS Performed at [facility], [address] [place], [place] [address]      Lab Results  Component Value Date   ALBUMIN 2.3 (L) 11/06/2023   ALBUMIN 2.3 (L) 11/05/2023   ALBUMIN 2.2 (L) 11/03/2023     Lab Results  Component Value Date   MG 1.9 11/06/2023   MG 2.0 11/05/2023   MG 1.9 11/03/2023   No results found for: "VD25OH"  No results found for: "PREALBUMIN"    Latest Ref Rng & Units 11/06/2023    5:45 AM 11/05/2023    4:39 AM 11/04/2023    5:17 AM  CBC EXTENDED  WBC 4.0 - 10.5 K/uL 13.7  10.1  9.3   RBC 4.22 - 5.81 MIL/uL 2.79  2.80  2.76   Hemoglobin 13.0 - 17.0 g/dL 8.4  8.5  8.4   HCT 28.4 - 52.0 % 25.7  25.8  25.6   Platelets 150 - 400 K/uL 256  234  241      There is no height or weight on file to calculate BMI.  Orders:  No orders of the defined types were placed in this encounter.  No orders of the defined types were placed in this encounter.    Procedures: No procedures performed  Clinical Data: No additional findings.  ROS:  All other systems negative, except as noted in the HPI. Review of Systems  Objective: Vital Signs: There were no vitals taken for this visit.  Specialty Comments:  No specialty comments available.  PMFS History: Patient Active Problem List  Diagnosis Date Noted   Heme positive stool 10/30/2023   Acute blood loss anemia 10/30/2023   Gastritis and gastroduodenitis 10/30/2023   Candida esophagitis (HCC) 10/30/2023   Acute osteomyelitis of toe of left foot (HCC) 10/26/2023   PAD (peripheral artery disease) (HCC) 10/23/2023   Osteomyelitis (HCC) 10/21/2023   Gangrene of left foot (HCC) 10/21/2023   Limb ischemia 10/21/2023   Atherosclerosis of aorta (HCC) 03/02/2022   Purpura senilis (HCC) 01/30/2022   Atrial flutter (HCC) 07/28/2019   Cardiomyopathy (HCC) 07/28/2019   Alcohol abuse 07/28/2019   Prolonged QT interval 05/19/2019   GAD (generalized anxiety disorder) 05/01/2019   COPD (chronic obstructive pulmonary disease) (HCC) 09/26/2018   Current use of anticoagulant therapy 09/12/2018   Hyperlipidemia 01/04/2017   Pre-diabetes 05/24/2016   Essential hypertension, benign 04/27/2016   History of prolonged Q-T  interval on ECG 04/01/2015   CAD (coronary artery disease) 02/17/2015   Tobacco abuse    Hypokalemia    Loculated pleural effusion 08/15/2014   S/P thoracentesis    Acute on chronic systolic CHF (congestive heart failure) (HCC)    Past Medical History:  Diagnosis Date   Acute CHF (HCC)    Maximo Spar 08/13/2014   Atrial fibrillation with RVR (HCC) 08/13/2014   new onset/notes 08/13/2014   CAP (community acquired pneumonia) 07/2014   Cardiomyopathy (HCC)    Migraine 1970     Family History  Problem Relation Age of Onset   Heart disease Mother        VALUE REPLACEMENT   COPD Father    Aneurysm Father     Past Surgical History:  Procedure Laterality Date   BIOPSY OF SKIN SUBCUTANEOUS TISSUE AND/OR MUCOUS MEMBRANE  10/30/2023   Procedure: BIOPSY, SKIN, SUBCUTANEOUS TISSUE, OR MUCOUS MEMBRANE;  Surgeon: Sergio Dandy, MD;  Location: [facility] ENDOSCOPY;  Service: Gastroenterology;;   CARDIOVERSION N/A 08/19/2014   Procedure: CARDIOVERSION;  Surgeon: Darlis Eisenmenger, MD;  Location: [facility] ENDOSCOPY;  Service: Cardiovascular;  Laterality: N/A;   ENDOVENOUS ABLATION SAPHENOUS VEIN W/ LASER Right 05/01/2018   endovenous laser ablation right greater saphenous vein by Stacia Dynes MD    ESOPHAGOGASTRODUODENOSCOPY N/A 10/30/2023   Procedure: EGD (ESOPHAGOGASTRODUODENOSCOPY);  Surgeon: Nandigam, Kavitha V, MD;  Location: [facility] ENDOSCOPY;  Service: Gastroenterology;  Laterality: N/A;   LEFT AND RIGHT HEART CATHETERIZATION WITH CORONARY ANGIOGRAM N/A 08/17/2014   Procedure: LEFT AND RIGHT HEART CATHETERIZATION WITH CORONARY ANGIOGRAM;  Surgeon: Mickiel Albany, MD;  Location: [facility] CATH LAB;  Service: Cardiovascular;  Laterality: N/A;   LOWER EXTREMITY ANGIOGRAPHY Left 10/25/2023   Procedure: Lower Extremity Angiography;  Surgeon: Young Hensen, MD;  Location: [facility] INVASIVE CV LAB;  Service: Cardiovascular;  Laterality: Left;   LOWER EXTREMITY INTERVENTION Left 10/25/2023   Procedure: LOWER EXTREMITY  INTERVENTION;  Surgeon: Young Hensen, MD;  Location: [facility] INVASIVE CV LAB;  Service: Cardiovascular;  Laterality: Left;   NO PAST SURGERIES     PERCUTANEOUS CORONARY STENT INTERVENTION (PCI-S)  08/17/2014   Procedure: PERCUTANEOUS CORONARY STENT INTERVENTION (PCI-S);  Surgeon: Mickiel Albany, MD;  Location: [facility] CATH LAB;  Service: Cardiovascular;;   TEE WITHOUT CARDIOVERSION N/A 08/19/2014   Procedure: TRANSESOPHAGEAL ECHOCARDIOGRAM (TEE);  Surgeon: Darlis Eisenmenger, MD;  Location: [facility] ENDOSCOPY;  Service: Cardiovascular;  Laterality: N/A;   TRANSMETATARSAL AMPUTATION Left 10/31/2023   Procedure: AMPUTATION, FOOT, TRANSMETATARSAL;  Surgeon: Timothy Ford, MD;  Location: [facility] OR;  Service: Orthopedics;  Laterality: Left;   Social History   Occupational History  Not on file  Tobacco Use   Smoking status: Every Day    Current packs/day: 0.50    Average packs/day: 0.5 packs/day for 44.0 years (22.0 ttl pk-yrs)    Types: Cigarettes   Smokeless tobacco: Never  Vaping Use   Vaping status: Never Used  Substance and Sexual Activity   Alcohol use: Yes    Alcohol/week: 0.0 standard drinks of alcohol    Comment: 08/13/2014 "I haven't drank in a month or so; if I drink it would be beer on a warm day"   Drug use: No   Sexual activity: Not Currently

## 2023-12-25 DIAGNOSIS — Z5181 Encounter for therapeutic drug level monitoring: Secondary | ICD-10-CM | POA: Diagnosis not present

## 2023-12-25 DIAGNOSIS — Z79899 Other long term (current) drug therapy: Secondary | ICD-10-CM | POA: Diagnosis not present

## 2023-12-25 DIAGNOSIS — I2510 Atherosclerotic heart disease of native coronary artery without angina pectoris: Secondary | ICD-10-CM | POA: Diagnosis not present

## 2024-01-01 DIAGNOSIS — Z79899 Other long term (current) drug therapy: Secondary | ICD-10-CM | POA: Diagnosis not present

## 2024-01-01 DIAGNOSIS — I2510 Atherosclerotic heart disease of native coronary artery without angina pectoris: Secondary | ICD-10-CM | POA: Diagnosis not present

## 2024-01-01 DIAGNOSIS — Z5181 Encounter for therapeutic drug level monitoring: Secondary | ICD-10-CM | POA: Diagnosis not present

## 2024-01-02 DIAGNOSIS — F411 Generalized anxiety disorder: Secondary | ICD-10-CM | POA: Diagnosis not present

## 2024-01-02 DIAGNOSIS — Z89432 Acquired absence of left foot: Secondary | ICD-10-CM | POA: Diagnosis not present

## 2024-01-02 DIAGNOSIS — J449 Chronic obstructive pulmonary disease, unspecified: Secondary | ICD-10-CM | POA: Diagnosis not present

## 2024-01-02 DIAGNOSIS — I739 Peripheral vascular disease, unspecified: Secondary | ICD-10-CM | POA: Diagnosis not present

## 2024-01-02 DIAGNOSIS — L089 Local infection of the skin and subcutaneous tissue, unspecified: Secondary | ICD-10-CM | POA: Diagnosis not present

## 2024-01-02 DIAGNOSIS — M86172 Other acute osteomyelitis, left ankle and foot: Secondary | ICD-10-CM | POA: Diagnosis not present

## 2024-01-02 DIAGNOSIS — E785 Hyperlipidemia, unspecified: Secondary | ICD-10-CM | POA: Diagnosis not present

## 2024-01-02 DIAGNOSIS — K2990 Gastroduodenitis, unspecified, without bleeding: Secondary | ICD-10-CM | POA: Diagnosis not present

## 2024-01-02 DIAGNOSIS — E559 Vitamin D deficiency, unspecified: Secondary | ICD-10-CM | POA: Diagnosis not present

## 2024-01-04 DIAGNOSIS — L089 Local infection of the skin and subcutaneous tissue, unspecified: Secondary | ICD-10-CM | POA: Diagnosis not present

## 2024-01-08 ENCOUNTER — Ambulatory Visit (INDEPENDENT_AMBULATORY_CARE_PROVIDER_SITE_OTHER): Admitting: Family

## 2024-01-08 DIAGNOSIS — Z89432 Acquired absence of left foot: Secondary | ICD-10-CM

## 2024-01-08 DIAGNOSIS — Z79899 Other long term (current) drug therapy: Secondary | ICD-10-CM | POA: Diagnosis not present

## 2024-01-08 DIAGNOSIS — I2510 Atherosclerotic heart disease of native coronary artery without angina pectoris: Secondary | ICD-10-CM | POA: Diagnosis not present

## 2024-01-09 ENCOUNTER — Encounter: Payer: Self-pay | Admitting: Family

## 2024-01-09 DIAGNOSIS — E559 Vitamin D deficiency, unspecified: Secondary | ICD-10-CM | POA: Diagnosis not present

## 2024-01-09 DIAGNOSIS — I4892 Unspecified atrial flutter: Secondary | ICD-10-CM | POA: Diagnosis not present

## 2024-01-09 DIAGNOSIS — M86172 Other acute osteomyelitis, left ankle and foot: Secondary | ICD-10-CM | POA: Diagnosis not present

## 2024-01-09 DIAGNOSIS — I429 Cardiomyopathy, unspecified: Secondary | ICD-10-CM | POA: Diagnosis not present

## 2024-01-09 DIAGNOSIS — Z89432 Acquired absence of left foot: Secondary | ICD-10-CM | POA: Diagnosis not present

## 2024-01-09 DIAGNOSIS — J449 Chronic obstructive pulmonary disease, unspecified: Secondary | ICD-10-CM | POA: Diagnosis not present

## 2024-01-09 DIAGNOSIS — I739 Peripheral vascular disease, unspecified: Secondary | ICD-10-CM | POA: Diagnosis not present

## 2024-01-09 DIAGNOSIS — L089 Local infection of the skin and subcutaneous tissue, unspecified: Secondary | ICD-10-CM | POA: Diagnosis not present

## 2024-01-09 DIAGNOSIS — E785 Hyperlipidemia, unspecified: Secondary | ICD-10-CM | POA: Diagnosis not present

## 2024-01-09 NOTE — Progress Notes (Signed)
 Post-Op Visit Note   Patient: Jason Spencer           Date of Birth: September 07, 1950           MRN: [id number] Visit Date: 01/08/2024 PCP: Lavell Bari LABOR, FNP  Chief Complaint:  Chief Complaint  Patient presents with   Left Foot - Routine Post Op    10/31/2023 left TMA     HPI:  HPI The patient is a 73 year old gentleman seen status post left transmetatarsal amputation April 23 which has been slow to heal he has 1 remaining open area laterally he reports the skilled nursing staff was concerned that this had some yellow drainage and have sent a swab for wound culture.  He is currently not on antibiotics no fever no chills Ortho Exam On examination left foot the transmetatarsal amputation overall is well healed there is 1 open area laterally this is about 5 mm in diameter with 3 mm of depth this is filled in with fibrinous exudative tissue there is no surrounding erythema weeping no foul odor Visit Diagnoses: No diagnosis found.  Plan: Iodosorb dressing applied today they will continue daily Dial soap cleansing may use silver cell or dry dressings.  He may advance weightbearing as tolerated in regular shoewear given an order for a custom orthotic with a carbon fiber plate and spacer today.  Follow-Up Instructions: No follow-ups on file.   Imaging: No results found.  Orders:  No orders of the defined types were placed in this encounter.  No orders of the defined types were placed in this encounter.    PMFS History: Patient Active Problem List   Diagnosis Date Noted   Heme positive stool 10/30/2023   Acute blood loss anemia 10/30/2023   Gastritis and gastroduodenitis 10/30/2023   Candida esophagitis (HCC) 10/30/2023   Acute osteomyelitis of toe of left foot (HCC) 10/26/2023   PAD (peripheral artery disease) (HCC) 10/23/2023   Osteomyelitis (HCC) 10/21/2023   Gangrene of left foot (HCC) 10/21/2023   Limb ischemia 10/21/2023   Atherosclerosis of aorta (HCC) 03/02/2022    Purpura senilis (HCC) 01/30/2022   Atrial flutter (HCC) 07/28/2019   Cardiomyopathy (HCC) 07/28/2019   Alcohol abuse 07/28/2019   Prolonged QT interval 05/19/2019   GAD (generalized anxiety disorder) 05/01/2019   COPD (chronic obstructive pulmonary disease) (HCC) 09/26/2018   Current use of anticoagulant therapy 09/12/2018   Hyperlipidemia 01/04/2017   Pre-diabetes 05/24/2016   Essential hypertension, benign 04/27/2016   History of prolonged Q-T interval on ECG 04/01/2015   CAD (coronary artery disease) 02/17/2015   Tobacco abuse    Hypokalemia    Loculated pleural effusion 08/15/2014   S/P thoracentesis    Acute on chronic systolic CHF (congestive heart failure) (HCC)    Past Medical History:  Diagnosis Date   Acute CHF (HCC)    thelbert 08/13/2014   Atrial fibrillation with RVR (HCC) 08/13/2014   new onset/notes 08/13/2014   CAP (community acquired pneumonia) 07/2014   Cardiomyopathy (HCC)    Migraine 1970     Family History  Problem Relation Age of Onset   Heart disease Mother        VALUE REPLACEMENT   COPD Father    Aneurysm Father     Past Surgical History:  Procedure Laterality Date   BIOPSY OF SKIN SUBCUTANEOUS TISSUE AND/OR MUCOUS MEMBRANE  10/30/2023   Procedure: BIOPSY, SKIN, SUBCUTANEOUS TISSUE, OR MUCOUS MEMBRANE;  Surgeon: Shila Gustav GAILS, MD;  Location: [facility] ENDOSCOPY;  Service: Gastroenterology;;  CARDIOVERSION N/A 08/19/2014   Procedure: CARDIOVERSION;  Surgeon: Ezra GORMAN Shuck, MD;  Location: [facility] ENDOSCOPY;  Service: Cardiovascular;  Laterality: N/A;   ENDOVENOUS ABLATION SAPHENOUS VEIN W/ LASER Right 05/01/2018   endovenous laser ablation right greater saphenous vein by Carlin Haddock MD    ESOPHAGOGASTRODUODENOSCOPY N/A 10/30/2023   Procedure: EGD (ESOPHAGOGASTRODUODENOSCOPY);  Surgeon: Nandigam, Kavitha V, MD;  Location: [facility] ENDOSCOPY;  Service: Gastroenterology;  Laterality: N/A;   LEFT AND RIGHT HEART CATHETERIZATION WITH CORONARY ANGIOGRAM N/A 08/17/2014    Procedure: LEFT AND RIGHT HEART CATHETERIZATION WITH CORONARY ANGIOGRAM;  Surgeon: Victory LELON Claudene DOUGLAS, MD;  Location: [facility] CATH LAB;  Service: Cardiovascular;  Laterality: N/A;   LOWER EXTREMITY ANGIOGRAPHY Left 10/25/2023   Procedure: Lower Extremity Angiography;  Surgeon: Gretta Lonni PARAS, MD;  Location: [facility] INVASIVE CV LAB;  Service: Cardiovascular;  Laterality: Left;   LOWER EXTREMITY INTERVENTION Left 10/25/2023   Procedure: LOWER EXTREMITY INTERVENTION;  Surgeon: Gretta Lonni PARAS, MD;  Location: [facility] INVASIVE CV LAB;  Service: Cardiovascular;  Laterality: Left;   NO PAST SURGERIES     PERCUTANEOUS CORONARY STENT INTERVENTION (PCI-S)  08/17/2014   Procedure: PERCUTANEOUS CORONARY STENT INTERVENTION (PCI-S);  Surgeon: Victory LELON Claudene DOUGLAS, MD;  Location: [facility] CATH LAB;  Service: Cardiovascular;;   TEE WITHOUT CARDIOVERSION N/A 08/19/2014   Procedure: TRANSESOPHAGEAL ECHOCARDIOGRAM (TEE);  Surgeon: Ezra GORMAN Shuck, MD;  Location: [facility] ENDOSCOPY;  Service: Cardiovascular;  Laterality: N/A;   TRANSMETATARSAL AMPUTATION Left 10/31/2023   Procedure: AMPUTATION, FOOT, TRANSMETATARSAL;  Surgeon: Harden Jerona GAILS, MD;  Location: [facility] OR;  Service: Orthopedics;  Laterality: Left;   Social History   Occupational History   Not on file  Tobacco Use   Smoking status: Every Day    Current packs/day: 0.50    Average packs/day: 0.5 packs/day for 44.0 years (22.0 ttl pk-yrs)    Types: Cigarettes   Smokeless tobacco: Never  Vaping Use   Vaping status: Never Used  Substance and Sexual Activity   Alcohol use: Yes    Alcohol/week: 0.0 standard drinks of alcohol    Comment: 08/13/2014 I haven't drank in a month or so; if I drink it would be beer on a warm day   Drug use: No   Sexual activity: Not Currently

## 2024-01-10 DIAGNOSIS — Z79899 Other long term (current) drug therapy: Secondary | ICD-10-CM | POA: Diagnosis not present

## 2024-01-10 DIAGNOSIS — J449 Chronic obstructive pulmonary disease, unspecified: Secondary | ICD-10-CM | POA: Diagnosis not present

## 2024-01-10 DIAGNOSIS — E785 Hyperlipidemia, unspecified: Secondary | ICD-10-CM | POA: Diagnosis not present

## 2024-01-10 DIAGNOSIS — I739 Peripheral vascular disease, unspecified: Secondary | ICD-10-CM | POA: Diagnosis not present

## 2024-01-10 DIAGNOSIS — Z89432 Acquired absence of left foot: Secondary | ICD-10-CM | POA: Diagnosis not present

## 2024-01-15 DIAGNOSIS — Z79899 Other long term (current) drug therapy: Secondary | ICD-10-CM | POA: Diagnosis not present

## 2024-01-15 DIAGNOSIS — I2510 Atherosclerotic heart disease of native coronary artery without angina pectoris: Secondary | ICD-10-CM | POA: Diagnosis not present

## 2024-01-15 DIAGNOSIS — Z5181 Encounter for therapeutic drug level monitoring: Secondary | ICD-10-CM | POA: Diagnosis not present

## 2024-01-17 ENCOUNTER — Telehealth: Payer: Self-pay | Admitting: Family

## 2024-01-17 NOTE — Telephone Encounter (Signed)
 [name] with [name] states that she called [facility] to make an appt for Jason Spencer and was told that they do not have the order and they will need it before they can schedule an appointment. [name] request if the order could be faxed to [facility] also.    Lucie Fax: [phone]  [phone]

## 2024-01-18 NOTE — Telephone Encounter (Signed)
 Faxed Rx to [facility] and to [employer] [facility]

## 2024-01-24 ENCOUNTER — Ambulatory Visit: Attending: Vascular Surgery | Admitting: Physician Assistant

## 2024-01-24 ENCOUNTER — Ambulatory Visit (HOSPITAL_COMMUNITY)
Admission: RE | Admit: 2024-01-24 | Discharge: 2024-01-24 | Disposition: A | Discharge status: Home / Self Care | Source: Ambulatory Visit | Attending: Vascular Surgery | Admitting: Vascular Surgery

## 2024-01-24 ENCOUNTER — Ambulatory Visit (HOSPITAL_COMMUNITY)
Admission: RE | Admit: 2024-01-24 | Discharge: 2024-01-24 | Disposition: A | Discharge status: Home / Self Care | Source: Ambulatory Visit | Attending: Vascular Surgery

## 2024-01-24 VITALS — BP 144/78 | HR 77 | Temp 97.7°F | Wt 176.0 lb

## 2024-01-24 DIAGNOSIS — I739 Peripheral vascular disease, unspecified: Secondary | ICD-10-CM

## 2024-01-24 DIAGNOSIS — I96 Gangrene, not elsewhere classified: Secondary | ICD-10-CM

## 2024-01-24 LAB — VAS US ABI WITH/WO TBI

## 2024-01-24 NOTE — Progress Notes (Signed)
 Office Note     CC:  follow up Requesting Provider:  Lavell Bari LABOR, FNP  HPI: Jason Spencer is a 73 y.o. (Apr 11, 1951) male who presents status post aortogram with left SFA and popliteal shockwave lithotripsy, angioplasty and stenting by Dr. Gretta on 10/25/2023 due to gangrenous toes and lateral foot.  He subsequently underwent left TMA by Dr. Harden on 10/31/2023.  He is following regularly with Dr. Harden for wound care.  He believes the TMA is healing well.  He says he is taking his aspirin , Plavix , statin daily.  He is an everyday smoker.  He has been smoking more recently due to the recent passing of his sister.   Past Medical History:  Diagnosis Date   Acute CHF (HCC)    thelbert 08/13/2014   Atrial fibrillation with RVR (HCC) 08/13/2014   new onset/notes 08/13/2014   CAP (community acquired pneumonia) 07/2014   Cardiomyopathy (HCC)    Migraine 1970     Past Surgical History:  Procedure Laterality Date   BIOPSY OF SKIN SUBCUTANEOUS TISSUE AND/OR MUCOUS MEMBRANE  10/30/2023   Procedure: BIOPSY, SKIN, SUBCUTANEOUS TISSUE, OR MUCOUS MEMBRANE;  Surgeon: Shila Gustav GAILS, MD;  Location: [facility] ENDOSCOPY;  Service: Gastroenterology;;   CARDIOVERSION N/A 08/19/2014   Procedure: CARDIOVERSION;  Surgeon: Ezra GORMAN Shuck, MD;  Location: [facility] ENDOSCOPY;  Service: Cardiovascular;  Laterality: N/A;   ENDOVENOUS ABLATION SAPHENOUS VEIN W/ LASER Right 05/01/2018   endovenous laser ablation right greater saphenous vein by Carlin Haddock MD    ESOPHAGOGASTRODUODENOSCOPY N/A 10/30/2023   Procedure: EGD (ESOPHAGOGASTRODUODENOSCOPY);  Surgeon: Nandigam, Kavitha V, MD;  Location: [facility] ENDOSCOPY;  Service: Gastroenterology;  Laterality: N/A;   LEFT AND RIGHT HEART CATHETERIZATION WITH CORONARY ANGIOGRAM N/A 08/17/2014   Procedure: LEFT AND RIGHT HEART CATHETERIZATION WITH CORONARY ANGIOGRAM;  Surgeon: Victory LELON Claudene DOUGLAS, MD;  Location: [facility] CATH LAB;  Service: Cardiovascular;  Laterality: N/A;   LOWER EXTREMITY  ANGIOGRAPHY Left 10/25/2023   Procedure: Lower Extremity Angiography;  Surgeon: Gretta Lonni PARAS, MD;  Location: [facility] INVASIVE CV LAB;  Service: Cardiovascular;  Laterality: Left;   LOWER EXTREMITY INTERVENTION Left 10/25/2023   Procedure: LOWER EXTREMITY INTERVENTION;  Surgeon: Gretta Lonni PARAS, MD;  Location: [facility] INVASIVE CV LAB;  Service: Cardiovascular;  Laterality: Left;   NO PAST SURGERIES     PERCUTANEOUS CORONARY STENT INTERVENTION (PCI-S)  08/17/2014   Procedure: PERCUTANEOUS CORONARY STENT INTERVENTION (PCI-S);  Surgeon: Victory LELON Claudene DOUGLAS, MD;  Location: [facility] CATH LAB;  Service: Cardiovascular;;   TEE WITHOUT CARDIOVERSION N/A 08/19/2014   Procedure: TRANSESOPHAGEAL ECHOCARDIOGRAM (TEE);  Surgeon: Ezra GORMAN Shuck, MD;  Location: [facility] ENDOSCOPY;  Service: Cardiovascular;  Laterality: N/A;   TRANSMETATARSAL AMPUTATION Left 10/31/2023   Procedure: AMPUTATION, FOOT, TRANSMETATARSAL;  Surgeon: Harden Jerona GAILS, MD;  Location: [facility] OR;  Service: Orthopedics;  Laterality: Left;    Social History   Socioeconomic History   Marital status: Single    Spouse name: Not on file   Number of children: Not on file   Years of education: Not on file   Highest education level: Not on file  Occupational History   Not on file  Tobacco Use   Smoking status: Every Day    Current packs/day: 0.50    Average packs/day: 0.5 packs/day for 44.0 years (22.0 ttl pk-yrs)    Types: Cigarettes   Smokeless tobacco: Never  Vaping Use   Vaping status: Never Used  Substance and Sexual Activity   Alcohol use: Yes    Alcohol/week: 0.0  standard drinks of alcohol    Comment: 08/13/2014 I haven't drank in a month or so; if I drink it would be beer on a warm day   Drug use: No   Sexual activity: Not Currently  Other Topics Concern   Not on file  Social History Narrative   Not on file   Social Drivers of Health   Financial Resource Strain: Low Risk  (01/08/2023)   Overall Financial Resource Strain (CARDIA)     Difficulty of Paying Living Expenses: Not hard at all  Food Insecurity: No Food Insecurity (10/21/2023)   Hunger Vital Sign    Worried About Running Out of Food in the Last Year: Never true    Ran Out of Food in the Last Year: Never true  Transportation Needs: No Transportation Needs (10/21/2023)   PRAPARE - Administrator, Civil Service (Medical): No    Lack of Transportation (Non-Medical): No  Physical Activity: Insufficiently Active (01/08/2023)   Exercise Vital Sign    Days of Exercise per Week: 2 days    Minutes of Exercise per Session: 20 min  Stress: Not on file  Social Connections: Moderately Integrated (10/22/2023)   Social Connection and Isolation Panel    Frequency of Communication with Friends and Family: Three times a week    Frequency of Social Gatherings with Friends and Family: Three times a week    Attends Religious Services: 1 to 4 times per year    Active Member of Clubs or Organizations: No    Attends Banker Meetings: 1 to 4 times per year    Marital Status: Never married  Intimate Partner Violence: Not At Risk (10/21/2023)   Humiliation, Afraid, Rape, and Kick questionnaire    Fear of Current or Ex-Partner: No    Emotionally Abused: No    Physically Abused: No    Sexually Abused: No    Family History  Problem Relation Age of Onset   Heart disease Mother        VALUE REPLACEMENT   COPD Father    Aneurysm Father     Current Outpatient Medications  Medication Sig Dispense Refill   amLODipine  (NORVASC ) 5 MG tablet TAKE ONE TABLET ONCE DAILY 90 tablet 0   Budeson-Glycopyrrol-Formoterol  (BREZTRI  AEROSPHERE) 160-9-4.8 MCG/ACT AERO Inhale 2 puffs into the lungs 2 (two) times daily. (Patient taking differently: Inhale 1 puff into the lungs daily.) 10.7 g 11   HYDROcodone -acetaminophen  (NORCO/VICODIN) 5-325 MG tablet Take 1 tablet by mouth every 4 (four) hours as needed for moderate pain (pain score 4-6). 30 tablet 0   metoprolol  succinate  (TOPROL -XL) 100 MG 24 hr tablet TAKE 1 TABLET WITH OR IMMEDIATELY FOLLOWING A MEAL 90 tablet 0   rosuvastatin  (CRESTOR ) 5 MG tablet Take 1 tablet (5 mg total) by mouth daily. (Patient not taking: Reported on 10/21/2023) 90 tablet 3   VASELINE PURE ULTRA WHITE ointment Apply 1 application  topically as needed (for psoriasis- affected areas).     No current facility-administered medications for this visit.    Allergies  Allergen Reactions   Shellfish Allergy Nausea And Vomiting and Other (See Comments)    Shrimp, especially     REVIEW OF SYSTEMS:   [X]  denotes positive finding, [ ]  denotes negative finding Cardiac  Comments:  Chest pain or chest pressure:    Shortness of breath upon exertion:    Short of breath when lying flat:    Irregular heart rhythm:  Vascular    Pain in calf, thigh, or hip brought on by ambulation:    Pain in feet at night that wakes you up from your sleep:     Blood clot in your veins:    Leg swelling:         Pulmonary    Oxygen at home:    Productive cough:     Wheezing:         Neurologic    Sudden weakness in arms or legs:     Sudden numbness in arms or legs:     Sudden onset of difficulty speaking or slurred speech:    Temporary loss of vision in one eye:     Problems with dizziness:         Gastrointestinal    Blood in stool:     Vomited blood:         Genitourinary    Burning when urinating:     Blood in urine:        Psychiatric    Major depression:         Hematologic    Bleeding problems:    Problems with blood clotting too easily:        Skin    Rashes or ulcers:        Constitutional    Fever or chills:      PHYSICAL EXAMINATION:  Vitals:   01/24/24 1400  BP: (!) 144/78  Pulse: 77  Temp: 97.7 F (36.5 C)  TempSrc: Temporal  Weight: 176 lb (79.8 kg)    General:  WDWN in NAD; vital signs documented above Gait: Not observed HENT: WNL, normocephalic Pulmonary: normal non-labored breathing Cardiac: regular  HR Abdomen: soft, NT, no masses Skin: without rashes Vascular Exam/Pulses: L PT brisk by doppler; R groin without hematoma Extremities: Left TMA exam deferred Musculoskeletal: no muscle wasting or atrophy  Neurologic: A&O X 3 Psychiatric:  The pt has Normal affect.   Non-Invasive Vascular Imaging:   Stents of left SFA in popliteal widely patent  Left    Lt Pressure (mmHg)IndexWaveform  Comment  +--------+------------------+-----+----------+-------+  [id number]                                      +--------+------------------+-----+----------+-------+  PTA    234               1.51 biphasic           +--------+------------------+-----+----------+-------+  DP     254               1.64 monophasic         +--------+------------------+-----+----------+-------+   ASSESSMENT/PLAN:: 73 y.o. male status post left SFA and popliteal shockwave lithotripsy, angioplasty, and stenting with subsequent TMA  Duplex demonstrates widely patent left SFA and popliteal stents.  He believes his TMA is healing well.  He has regular follow-up with Dr. Harden.  He will continue his aspirin , Plavix , statin daily.  We discussed smoking cessation to help with wound healing.  We will repeat left lower extremity arterial duplex and ABI in 6 months.   He knows to notify our office sooner with any questions or concerns.   Donnice Sender, PA-C [facility] [phone]  Clinic MD:   [place]

## 2024-01-25 DIAGNOSIS — E7849 Other hyperlipidemia: Secondary | ICD-10-CM | POA: Diagnosis not present

## 2024-01-28 ENCOUNTER — Other Ambulatory Visit: Payer: Self-pay

## 2024-01-28 DIAGNOSIS — I739 Peripheral vascular disease, unspecified: Secondary | ICD-10-CM

## 2024-02-04 DIAGNOSIS — Z5181 Encounter for therapeutic drug level monitoring: Secondary | ICD-10-CM | POA: Diagnosis not present

## 2024-02-04 DIAGNOSIS — I2510 Atherosclerotic heart disease of native coronary artery without angina pectoris: Secondary | ICD-10-CM | POA: Diagnosis not present

## 2024-02-04 DIAGNOSIS — E785 Hyperlipidemia, unspecified: Secondary | ICD-10-CM | POA: Diagnosis not present

## 2024-02-04 DIAGNOSIS — Z79899 Other long term (current) drug therapy: Secondary | ICD-10-CM | POA: Diagnosis not present

## 2024-02-05 ENCOUNTER — Encounter: Admitting: Family

## 2024-02-05 DIAGNOSIS — M6281 Muscle weakness (generalized): Secondary | ICD-10-CM | POA: Diagnosis not present

## 2024-02-05 DIAGNOSIS — R2689 Other abnormalities of gait and mobility: Secondary | ICD-10-CM | POA: Diagnosis not present

## 2024-02-05 DIAGNOSIS — Z4781 Encounter for orthopedic aftercare following surgical amputation: Secondary | ICD-10-CM | POA: Diagnosis not present

## 2024-02-06 DIAGNOSIS — R2689 Other abnormalities of gait and mobility: Secondary | ICD-10-CM | POA: Diagnosis not present

## 2024-02-06 DIAGNOSIS — Z4781 Encounter for orthopedic aftercare following surgical amputation: Secondary | ICD-10-CM | POA: Diagnosis not present

## 2024-02-06 DIAGNOSIS — M6281 Muscle weakness (generalized): Secondary | ICD-10-CM | POA: Diagnosis not present

## 2024-02-07 DIAGNOSIS — R2689 Other abnormalities of gait and mobility: Secondary | ICD-10-CM | POA: Diagnosis not present

## 2024-02-07 DIAGNOSIS — M6281 Muscle weakness (generalized): Secondary | ICD-10-CM | POA: Diagnosis not present

## 2024-02-07 DIAGNOSIS — Z4781 Encounter for orthopedic aftercare following surgical amputation: Secondary | ICD-10-CM | POA: Diagnosis not present

## 2024-02-08 DIAGNOSIS — M6281 Muscle weakness (generalized): Secondary | ICD-10-CM | POA: Diagnosis not present

## 2024-02-08 DIAGNOSIS — Z4781 Encounter for orthopedic aftercare following surgical amputation: Secondary | ICD-10-CM | POA: Diagnosis not present

## 2024-02-08 DIAGNOSIS — R2689 Other abnormalities of gait and mobility: Secondary | ICD-10-CM | POA: Diagnosis not present

## 2024-02-11 DIAGNOSIS — R2689 Other abnormalities of gait and mobility: Secondary | ICD-10-CM | POA: Diagnosis not present

## 2024-02-11 DIAGNOSIS — Z5181 Encounter for therapeutic drug level monitoring: Secondary | ICD-10-CM | POA: Diagnosis not present

## 2024-02-11 DIAGNOSIS — Z4781 Encounter for orthopedic aftercare following surgical amputation: Secondary | ICD-10-CM | POA: Diagnosis not present

## 2024-02-11 DIAGNOSIS — I2510 Atherosclerotic heart disease of native coronary artery without angina pectoris: Secondary | ICD-10-CM | POA: Diagnosis not present

## 2024-02-11 DIAGNOSIS — M6281 Muscle weakness (generalized): Secondary | ICD-10-CM | POA: Diagnosis not present

## 2024-02-12 ENCOUNTER — Encounter: Payer: Self-pay | Admitting: Family

## 2024-02-12 ENCOUNTER — Ambulatory Visit (INDEPENDENT_AMBULATORY_CARE_PROVIDER_SITE_OTHER): Admitting: Family

## 2024-02-12 ENCOUNTER — Other Ambulatory Visit (INDEPENDENT_AMBULATORY_CARE_PROVIDER_SITE_OTHER)

## 2024-02-12 DIAGNOSIS — I739 Peripheral vascular disease, unspecified: Secondary | ICD-10-CM | POA: Diagnosis not present

## 2024-02-12 DIAGNOSIS — G8929 Other chronic pain: Secondary | ICD-10-CM

## 2024-02-12 DIAGNOSIS — Q682 Congenital deformity of knee: Secondary | ICD-10-CM

## 2024-02-12 DIAGNOSIS — Z4781 Encounter for orthopedic aftercare following surgical amputation: Secondary | ICD-10-CM | POA: Diagnosis not present

## 2024-02-12 DIAGNOSIS — M25562 Pain in left knee: Secondary | ICD-10-CM

## 2024-02-12 DIAGNOSIS — R52 Pain, unspecified: Secondary | ICD-10-CM | POA: Diagnosis not present

## 2024-02-12 DIAGNOSIS — Z89432 Acquired absence of left foot: Secondary | ICD-10-CM

## 2024-02-12 DIAGNOSIS — M25561 Pain in right knee: Secondary | ICD-10-CM

## 2024-02-12 DIAGNOSIS — Z79899 Other long term (current) drug therapy: Secondary | ICD-10-CM | POA: Diagnosis not present

## 2024-02-12 DIAGNOSIS — R2689 Other abnormalities of gait and mobility: Secondary | ICD-10-CM | POA: Diagnosis not present

## 2024-02-12 DIAGNOSIS — M6281 Muscle weakness (generalized): Secondary | ICD-10-CM | POA: Diagnosis not present

## 2024-02-12 MED ORDER — METHYLPREDNISOLONE ACETATE 40 MG/ML IJ SUSP
40.0000 mg | INTRAMUSCULAR | Status: AC | PRN
  Administered 2024-02-12: 40 mg via INTRA_ARTICULAR

## 2024-02-12 MED ORDER — LIDOCAINE HCL 1 % IJ SOLN
5.0000 mL | INTRAMUSCULAR | Status: AC | PRN
  Administered 2024-02-12: 5 mL

## 2024-02-12 MED ORDER — LIDOCAINE HCL 1 % IJ SOLN
5.0000 mL | INTRAMUSCULAR | Status: AC | PRN
Start: 2024-02-12 — End: 2024-02-12
  Administered 2024-02-12: 5 mL

## 2024-02-12 NOTE — Progress Notes (Signed)
 Post-Op Visit Note   Patient: Jason Spencer           Date of Birth: 12-Sep-1950           MRN: [id number] Visit Date: 02/12/2024 PCP: Lavell Bari LABOR, FNP  Chief Complaint:  Chief Complaint  Patient presents with   Left Foot - Follow-up    10/31/2023 left TMA    HPI:  HPI The patient is a 73 year old gentleman who presents today status post left transmetatarsal amputation April 23 this has been slow to heal he has been doing Iodosorb dressing changes at skilled nursing  Reports has had custom orthotic with carbon fiber plate and spacer fabricated he has been molded but this has not yet been delivered  He has been having difficulty bearing weight due to left foot pain he feels this is due to lack of arch support.  He has also had difficulty with therapy due to his bilateral knee pain.  He reports aching pain no stabbing fears falling has a history of giving way has had Depo-Medrol  injections in his bilateral knees in the past Right Knee Exam   Muscle Strength  The patient has normal right knee strength.  Range of Motion  The patient has normal right knee ROM.  Tests  Varus: negative Valgus: negative  Other  Effusion: no effusion present   Left Knee Exam   Muscle Strength  The patient has normal left knee strength.  Tests  Varus: negative Valgus: negative  Other  Effusion: no effusion present     On examination left foot the transmetatarsal amputation is healing well there is 1 remaining open area this is about 5 mm in diameter over the lateral border this is 3 mm deep there is granulation in the wound bed there is no tunneling no active drainage no surrounding erythema or warmth  Visit Diagnoses: No diagnosis found.  Plan: Continue with daily Dial soap cleansing.  Dressing changes daily.  May advance weightbearing as tolerated in regular shoewear.    Patellofemoral pain, bilateral knees. Patella alta. Depo-Medrol  injections bilateral knees.  Patient  tolerated well.      Procedure Note  Patient: Jason Spencer             Date of Birth: 03-15-1951           MRN: [id number]             Visit Date: 02/12/2024  Procedures: Visit Diagnoses:  1. Chronic pain of both knees     Large Joint Inj: bilateral knee on 02/12/2024 8:30 AM Indications: pain Details: 18 G 1.5 in needle, anteromedial approach Medications (Right): 5 mL lidocaine  1 %; 40 mg methylPREDNISolone  acetate 40 MG/ML Medications (Left): 5 mL lidocaine  1 %; 40 mg methylPREDNISolone  acetate 40 MG/ML Consent was given by the patient.         Follow-Up Instructions: No follow-ups on file.   Imaging: No results found.  Orders:  No orders of the defined types were placed in this encounter.  No orders of the defined types were placed in this encounter.    PMFS History: Patient Active Problem List   Diagnosis Date Noted   Heme positive stool 10/30/2023   Acute blood loss anemia 10/30/2023   Gastritis and gastroduodenitis 10/30/2023   Candida esophagitis (HCC) 10/30/2023   Acute osteomyelitis of toe of left foot (HCC) 10/26/2023   PAD (peripheral artery disease) (HCC) 10/23/2023   Osteomyelitis (HCC) 10/21/2023   Gangrene of left foot (HCC) 10/21/2023  Limb ischemia 10/21/2023   Atherosclerosis of aorta (HCC) 03/02/2022   Purpura senilis (HCC) 01/30/2022   Atrial flutter (HCC) 07/28/2019   Cardiomyopathy (HCC) 07/28/2019   Alcohol abuse 07/28/2019   Prolonged QT interval 05/19/2019   GAD (generalized anxiety disorder) 05/01/2019   COPD (chronic obstructive pulmonary disease) (HCC) 09/26/2018   Current use of anticoagulant therapy 09/12/2018   Hyperlipidemia 01/04/2017   Pre-diabetes 05/24/2016   Essential hypertension, benign 04/27/2016   History of prolonged Q-T interval on ECG 04/01/2015   CAD (coronary artery disease) 02/17/2015   Tobacco abuse    Hypokalemia    Loculated pleural effusion 08/15/2014   S/P thoracentesis    Acute on chronic  systolic CHF (congestive heart failure) (HCC)    Past Medical History:  Diagnosis Date   Acute CHF (HCC)    thelbert 08/13/2014   Atrial fibrillation with RVR (HCC) 08/13/2014   new onset/notes 08/13/2014   CAP (community acquired pneumonia) 07/2014   Cardiomyopathy (HCC)    Migraine 1970     Family History  Problem Relation Age of Onset   Heart disease Mother        VALUE REPLACEMENT   COPD Father    Aneurysm Father     Past Surgical History:  Procedure Laterality Date   BIOPSY OF SKIN SUBCUTANEOUS TISSUE AND/OR MUCOUS MEMBRANE  10/30/2023   Procedure: BIOPSY, SKIN, SUBCUTANEOUS TISSUE, OR MUCOUS MEMBRANE;  Surgeon: Shila Gustav GAILS, MD;  Location: [facility] ENDOSCOPY;  Service: Gastroenterology;;   CARDIOVERSION N/A 08/19/2014   Procedure: CARDIOVERSION;  Surgeon: Ezra GORMAN Shuck, MD;  Location: [facility] ENDOSCOPY;  Service: Cardiovascular;  Laterality: N/A;   ENDOVENOUS ABLATION SAPHENOUS VEIN W/ LASER Right 05/01/2018   endovenous laser ablation right greater saphenous vein by Carlin Haddock MD    ESOPHAGOGASTRODUODENOSCOPY N/A 10/30/2023   Procedure: EGD (ESOPHAGOGASTRODUODENOSCOPY);  Surgeon: Nandigam, Kavitha V, MD;  Location: [facility] ENDOSCOPY;  Service: Gastroenterology;  Laterality: N/A;   LEFT AND RIGHT HEART CATHETERIZATION WITH CORONARY ANGIOGRAM N/A 08/17/2014   Procedure: LEFT AND RIGHT HEART CATHETERIZATION WITH CORONARY ANGIOGRAM;  Surgeon: Victory LELON Claudene DOUGLAS, MD;  Location: [facility] CATH LAB;  Service: Cardiovascular;  Laterality: N/A;   LOWER EXTREMITY ANGIOGRAPHY Left 10/25/2023   Procedure: Lower Extremity Angiography;  Surgeon: Gretta Lonni PARAS, MD;  Location: [facility] INVASIVE CV LAB;  Service: Cardiovascular;  Laterality: Left;   LOWER EXTREMITY INTERVENTION Left 10/25/2023   Procedure: LOWER EXTREMITY INTERVENTION;  Surgeon: Gretta Lonni PARAS, MD;  Location: [facility] INVASIVE CV LAB;  Service: Cardiovascular;  Laterality: Left;   NO PAST SURGERIES     PERCUTANEOUS CORONARY STENT INTERVENTION  (PCI-S)  08/17/2014   Procedure: PERCUTANEOUS CORONARY STENT INTERVENTION (PCI-S);  Surgeon: Victory LELON Claudene DOUGLAS, MD;  Location: [facility] CATH LAB;  Service: Cardiovascular;;   TEE WITHOUT CARDIOVERSION N/A 08/19/2014   Procedure: TRANSESOPHAGEAL ECHOCARDIOGRAM (TEE);  Surgeon: Ezra GORMAN Shuck, MD;  Location: [facility] ENDOSCOPY;  Service: Cardiovascular;  Laterality: N/A;   TRANSMETATARSAL AMPUTATION Left 10/31/2023   Procedure: AMPUTATION, FOOT, TRANSMETATARSAL;  Surgeon: Harden Jerona GAILS, MD;  Location[facility] OR;  Service: Orthopedics;  Laterality: Left;   Social History   Occupational History   Not on file  Tobacco Use   Smoking status: Every Day    Current packs/day: 0.50    Average packs/day: 0.5 packs/day for 44.0 years (22.0 ttl pk-yrs)    Types: Cigarettes   Smokeless tobacco: Never  Vaping Use   Vaping status: Never Used  Substance and Sexual Activity   Alcohol use: Yes  Alcohol/week: 0.0 standard drinks of alcohol    Comment: 08/13/2014 I haven't drank in a month or so; if I drink it would be beer on a warm day   Drug use: No   Sexual activity: Not Currently

## 2024-02-13 DIAGNOSIS — Z4781 Encounter for orthopedic aftercare following surgical amputation: Secondary | ICD-10-CM | POA: Diagnosis not present

## 2024-02-13 DIAGNOSIS — M6281 Muscle weakness (generalized): Secondary | ICD-10-CM | POA: Diagnosis not present

## 2024-02-13 DIAGNOSIS — R2689 Other abnormalities of gait and mobility: Secondary | ICD-10-CM | POA: Diagnosis not present

## 2024-02-14 DIAGNOSIS — Z4781 Encounter for orthopedic aftercare following surgical amputation: Secondary | ICD-10-CM | POA: Diagnosis not present

## 2024-02-14 DIAGNOSIS — R2689 Other abnormalities of gait and mobility: Secondary | ICD-10-CM | POA: Diagnosis not present

## 2024-02-14 DIAGNOSIS — M6281 Muscle weakness (generalized): Secondary | ICD-10-CM | POA: Diagnosis not present

## 2024-02-15 DIAGNOSIS — Z4781 Encounter for orthopedic aftercare following surgical amputation: Secondary | ICD-10-CM | POA: Diagnosis not present

## 2024-02-15 DIAGNOSIS — M6281 Muscle weakness (generalized): Secondary | ICD-10-CM | POA: Diagnosis not present

## 2024-02-15 DIAGNOSIS — R2689 Other abnormalities of gait and mobility: Secondary | ICD-10-CM | POA: Diagnosis not present

## 2024-02-18 DIAGNOSIS — Z4781 Encounter for orthopedic aftercare following surgical amputation: Secondary | ICD-10-CM | POA: Diagnosis not present

## 2024-02-18 DIAGNOSIS — M6281 Muscle weakness (generalized): Secondary | ICD-10-CM | POA: Diagnosis not present

## 2024-02-18 DIAGNOSIS — R2689 Other abnormalities of gait and mobility: Secondary | ICD-10-CM | POA: Diagnosis not present

## 2024-02-19 DIAGNOSIS — Z4781 Encounter for orthopedic aftercare following surgical amputation: Secondary | ICD-10-CM | POA: Diagnosis not present

## 2024-02-19 DIAGNOSIS — M6281 Muscle weakness (generalized): Secondary | ICD-10-CM | POA: Diagnosis not present

## 2024-02-19 DIAGNOSIS — R2689 Other abnormalities of gait and mobility: Secondary | ICD-10-CM | POA: Diagnosis not present

## 2024-02-20 DIAGNOSIS — M6281 Muscle weakness (generalized): Secondary | ICD-10-CM | POA: Diagnosis not present

## 2024-02-20 DIAGNOSIS — R2689 Other abnormalities of gait and mobility: Secondary | ICD-10-CM | POA: Diagnosis not present

## 2024-02-20 DIAGNOSIS — Z4781 Encounter for orthopedic aftercare following surgical amputation: Secondary | ICD-10-CM | POA: Diagnosis not present

## 2024-02-21 DIAGNOSIS — R2689 Other abnormalities of gait and mobility: Secondary | ICD-10-CM | POA: Diagnosis not present

## 2024-02-21 DIAGNOSIS — Z4781 Encounter for orthopedic aftercare following surgical amputation: Secondary | ICD-10-CM | POA: Diagnosis not present

## 2024-02-21 DIAGNOSIS — M6281 Muscle weakness (generalized): Secondary | ICD-10-CM | POA: Diagnosis not present

## 2024-02-22 DIAGNOSIS — Z4781 Encounter for orthopedic aftercare following surgical amputation: Secondary | ICD-10-CM | POA: Diagnosis not present

## 2024-02-22 DIAGNOSIS — R2689 Other abnormalities of gait and mobility: Secondary | ICD-10-CM | POA: Diagnosis not present

## 2024-02-22 DIAGNOSIS — M6281 Muscle weakness (generalized): Secondary | ICD-10-CM | POA: Diagnosis not present

## 2024-02-25 DIAGNOSIS — J449 Chronic obstructive pulmonary disease, unspecified: Secondary | ICD-10-CM | POA: Diagnosis not present

## 2024-02-25 DIAGNOSIS — Z79899 Other long term (current) drug therapy: Secondary | ICD-10-CM | POA: Diagnosis not present

## 2024-02-25 DIAGNOSIS — I739 Peripheral vascular disease, unspecified: Secondary | ICD-10-CM | POA: Diagnosis not present

## 2024-02-25 DIAGNOSIS — Z4781 Encounter for orthopedic aftercare following surgical amputation: Secondary | ICD-10-CM | POA: Diagnosis not present

## 2024-02-25 DIAGNOSIS — M6281 Muscle weakness (generalized): Secondary | ICD-10-CM | POA: Diagnosis not present

## 2024-02-25 DIAGNOSIS — R2689 Other abnormalities of gait and mobility: Secondary | ICD-10-CM | POA: Diagnosis not present

## 2024-02-25 DIAGNOSIS — F411 Generalized anxiety disorder: Secondary | ICD-10-CM | POA: Diagnosis not present

## 2024-02-26 DIAGNOSIS — M6281 Muscle weakness (generalized): Secondary | ICD-10-CM | POA: Diagnosis not present

## 2024-02-26 DIAGNOSIS — Z4781 Encounter for orthopedic aftercare following surgical amputation: Secondary | ICD-10-CM | POA: Diagnosis not present

## 2024-02-26 DIAGNOSIS — R2689 Other abnormalities of gait and mobility: Secondary | ICD-10-CM | POA: Diagnosis not present

## 2024-02-27 DIAGNOSIS — M6281 Muscle weakness (generalized): Secondary | ICD-10-CM | POA: Diagnosis not present

## 2024-02-27 DIAGNOSIS — Z4781 Encounter for orthopedic aftercare following surgical amputation: Secondary | ICD-10-CM | POA: Diagnosis not present

## 2024-02-27 DIAGNOSIS — R2689 Other abnormalities of gait and mobility: Secondary | ICD-10-CM | POA: Diagnosis not present

## 2024-02-28 DIAGNOSIS — R2689 Other abnormalities of gait and mobility: Secondary | ICD-10-CM | POA: Diagnosis not present

## 2024-02-28 DIAGNOSIS — R52 Pain, unspecified: Secondary | ICD-10-CM | POA: Diagnosis not present

## 2024-02-28 DIAGNOSIS — I739 Peripheral vascular disease, unspecified: Secondary | ICD-10-CM | POA: Diagnosis not present

## 2024-02-28 DIAGNOSIS — Z89432 Acquired absence of left foot: Secondary | ICD-10-CM | POA: Diagnosis not present

## 2024-02-28 DIAGNOSIS — Z79899 Other long term (current) drug therapy: Secondary | ICD-10-CM | POA: Diagnosis not present

## 2024-02-28 DIAGNOSIS — M6281 Muscle weakness (generalized): Secondary | ICD-10-CM | POA: Diagnosis not present

## 2024-02-28 DIAGNOSIS — Z4781 Encounter for orthopedic aftercare following surgical amputation: Secondary | ICD-10-CM | POA: Diagnosis not present

## 2024-02-29 DIAGNOSIS — M6281 Muscle weakness (generalized): Secondary | ICD-10-CM | POA: Diagnosis not present

## 2024-02-29 DIAGNOSIS — Z4781 Encounter for orthopedic aftercare following surgical amputation: Secondary | ICD-10-CM | POA: Diagnosis not present

## 2024-02-29 DIAGNOSIS — R2689 Other abnormalities of gait and mobility: Secondary | ICD-10-CM | POA: Diagnosis not present

## 2024-03-03 DIAGNOSIS — Z79899 Other long term (current) drug therapy: Secondary | ICD-10-CM | POA: Diagnosis not present

## 2024-03-03 DIAGNOSIS — I2510 Atherosclerotic heart disease of native coronary artery without angina pectoris: Secondary | ICD-10-CM | POA: Diagnosis not present

## 2024-03-03 DIAGNOSIS — Z5181 Encounter for therapeutic drug level monitoring: Secondary | ICD-10-CM | POA: Diagnosis not present

## 2024-03-04 DIAGNOSIS — R2689 Other abnormalities of gait and mobility: Secondary | ICD-10-CM | POA: Diagnosis not present

## 2024-03-04 DIAGNOSIS — Z4781 Encounter for orthopedic aftercare following surgical amputation: Secondary | ICD-10-CM | POA: Diagnosis not present

## 2024-03-04 DIAGNOSIS — M6281 Muscle weakness (generalized): Secondary | ICD-10-CM | POA: Diagnosis not present

## 2024-03-05 DIAGNOSIS — Z4781 Encounter for orthopedic aftercare following surgical amputation: Secondary | ICD-10-CM | POA: Diagnosis not present

## 2024-03-05 DIAGNOSIS — M6281 Muscle weakness (generalized): Secondary | ICD-10-CM | POA: Diagnosis not present

## 2024-03-05 DIAGNOSIS — R2689 Other abnormalities of gait and mobility: Secondary | ICD-10-CM | POA: Diagnosis not present

## 2024-03-06 DIAGNOSIS — M6281 Muscle weakness (generalized): Secondary | ICD-10-CM | POA: Diagnosis not present

## 2024-03-06 DIAGNOSIS — Z4781 Encounter for orthopedic aftercare following surgical amputation: Secondary | ICD-10-CM | POA: Diagnosis not present

## 2024-03-06 DIAGNOSIS — R2689 Other abnormalities of gait and mobility: Secondary | ICD-10-CM | POA: Diagnosis not present

## 2024-03-07 DIAGNOSIS — M6281 Muscle weakness (generalized): Secondary | ICD-10-CM | POA: Diagnosis not present

## 2024-03-07 DIAGNOSIS — Z4781 Encounter for orthopedic aftercare following surgical amputation: Secondary | ICD-10-CM | POA: Diagnosis not present

## 2024-03-07 DIAGNOSIS — R2689 Other abnormalities of gait and mobility: Secondary | ICD-10-CM | POA: Diagnosis not present

## 2024-03-08 DIAGNOSIS — Z4781 Encounter for orthopedic aftercare following surgical amputation: Secondary | ICD-10-CM | POA: Diagnosis not present

## 2024-03-08 DIAGNOSIS — R2689 Other abnormalities of gait and mobility: Secondary | ICD-10-CM | POA: Diagnosis not present

## 2024-03-08 DIAGNOSIS — M6281 Muscle weakness (generalized): Secondary | ICD-10-CM | POA: Diagnosis not present

## 2024-03-09 DIAGNOSIS — Z4781 Encounter for orthopedic aftercare following surgical amputation: Secondary | ICD-10-CM | POA: Diagnosis not present

## 2024-03-09 DIAGNOSIS — M6281 Muscle weakness (generalized): Secondary | ICD-10-CM | POA: Diagnosis not present

## 2024-03-09 DIAGNOSIS — R2689 Other abnormalities of gait and mobility: Secondary | ICD-10-CM | POA: Diagnosis not present

## 2024-03-11 ENCOUNTER — Ambulatory Visit (INDEPENDENT_AMBULATORY_CARE_PROVIDER_SITE_OTHER): Admitting: Family

## 2024-03-11 ENCOUNTER — Encounter: Payer: Self-pay | Admitting: Family

## 2024-03-11 DIAGNOSIS — I96 Gangrene, not elsewhere classified: Secondary | ICD-10-CM | POA: Diagnosis not present

## 2024-03-11 NOTE — Progress Notes (Signed)
 Post-Op Visit Note   Patient: Jason Spencer           Date of Birth: 30-Jul-1950           MRN: [id number] Visit Date: 03/11/2024 PCP: Lavell Bari LABOR, FNP  Chief Complaint:  Chief Complaint  Patient presents with   Left Foot - Follow-up    Hx left TMA    HPI:  HPI The patient is a 73 year old gentleman who is seen in follow-up status post transmetatarsal amputation on the left April 23 of this year this was quite slow to heal he is currently residing at skilled nursing.  He reports he is due to pick up his custom orthotic for the left tomorrow Ortho Exam On examination left foot the transmetatarsal amputation is well-healed there is no edema no erythema no open areas  Visit Diagnoses: No diagnosis found.  Plan: Advance activities as tolerated advance to regular shoewear he will follow-up as needed  Follow-Up Instructions: No follow-ups on file.   Imaging: No results found.  Orders:  No orders of the defined types were placed in this encounter.  No orders of the defined types were placed in this encounter.    PMFS History: Patient Active Problem List   Diagnosis Date Noted   Heme positive stool 10/30/2023   Acute blood loss anemia 10/30/2023   Gastritis and gastroduodenitis 10/30/2023   Candida esophagitis (HCC) 10/30/2023   Acute osteomyelitis of toe of left foot (HCC) 10/26/2023   PAD (peripheral artery disease) (HCC) 10/23/2023   Osteomyelitis (HCC) 10/21/2023   Gangrene of left foot (HCC) 10/21/2023   Limb ischemia 10/21/2023   Atherosclerosis of aorta (HCC) 03/02/2022   Purpura senilis (HCC) 01/30/2022   Atrial flutter (HCC) 07/28/2019   Cardiomyopathy (HCC) 07/28/2019   Alcohol abuse 07/28/2019   Prolonged QT interval 05/19/2019   GAD (generalized anxiety disorder) 05/01/2019   COPD (chronic obstructive pulmonary disease) (HCC) 09/26/2018   Current use of anticoagulant therapy 09/12/2018   Hyperlipidemia 01/04/2017   Pre-diabetes 05/24/2016    Essential hypertension, benign 04/27/2016   History of prolonged Q-T interval on ECG 04/01/2015   CAD (coronary artery disease) 02/17/2015   Tobacco abuse    Hypokalemia    Loculated pleural effusion 08/15/2014   S/P thoracentesis    Acute on chronic systolic CHF (congestive heart failure) (HCC)    Past Medical History:  Diagnosis Date   Acute CHF (HCC)    thelbert 08/13/2014   Atrial fibrillation with RVR (HCC) 08/13/2014   new onset/notes 08/13/2014   CAP (community acquired pneumonia) 07/2014   Cardiomyopathy (HCC)    Migraine 1970     Family History  Problem Relation Age of Onset   Heart disease Mother        VALUE REPLACEMENT   COPD Father    Aneurysm Father     Past Surgical History:  Procedure Laterality Date   BIOPSY OF SKIN SUBCUTANEOUS TISSUE AND/OR MUCOUS MEMBRANE  10/30/2023   Procedure: BIOPSY, SKIN, SUBCUTANEOUS TISSUE, OR MUCOUS MEMBRANE;  Surgeon: Shila Gustav GAILS, MD;  Location: [facility] ENDOSCOPY;  Service: Gastroenterology;;   CARDIOVERSION N/A 08/19/2014   Procedure: CARDIOVERSION;  Surgeon: Ezra GORMAN Shuck, MD;  Location: [facility] ENDOSCOPY;  Service: Cardiovascular;  Laterality: N/A;   ENDOVENOUS ABLATION SAPHENOUS VEIN W/ LASER Right 05/01/2018   endovenous laser ablation right greater saphenous vein by Carlin Haddock MD    ESOPHAGOGASTRODUODENOSCOPY N/A 10/30/2023   Procedure: EGD (ESOPHAGOGASTRODUODENOSCOPY);  Surgeon: Nandigam, Kavitha V, MD;  Location: [facility] ENDOSCOPY;  Service: Gastroenterology;  Laterality: N/A;   LEFT AND RIGHT HEART CATHETERIZATION WITH CORONARY ANGIOGRAM N/A 08/17/2014   Procedure: LEFT AND RIGHT HEART CATHETERIZATION WITH CORONARY ANGIOGRAM;  Surgeon: Victory LELON Claudene DOUGLAS, MD;  Location: [facility] CATH LAB;  Service: Cardiovascular;  Laterality: N/A;   LOWER EXTREMITY ANGIOGRAPHY Left 10/25/2023   Procedure: Lower Extremity Angiography;  Surgeon: Gretta Lonni PARAS, MD;  Location: [facility] INVASIVE CV LAB;  Service: Cardiovascular;  Laterality: Left;   LOWER EXTREMITY  INTERVENTION Left 10/25/2023   Procedure: LOWER EXTREMITY INTERVENTION;  Surgeon: Gretta Lonni PARAS, MD;  Location: [facility] INVASIVE CV LAB;  Service: Cardiovascular;  Laterality: Left;   NO PAST SURGERIES     PERCUTANEOUS CORONARY STENT INTERVENTION (PCI-S)  08/17/2014   Procedure: PERCUTANEOUS CORONARY STENT INTERVENTION (PCI-S);  Surgeon: Victory LELON Claudene DOUGLAS, MD;  Location: [facility] CATH LAB;  Service: Cardiovascular;;   TEE WITHOUT CARDIOVERSION N/A 08/19/2014   Procedure: TRANSESOPHAGEAL ECHOCARDIOGRAM (TEE);  Surgeon: Ezra GORMAN Shuck, MD;  Location: [facility] ENDOSCOPY;  Service: Cardiovascular;  Laterality: N/A;   TRANSMETATARSAL AMPUTATION Left 10/31/2023   Procedure: AMPUTATION, FOOT, TRANSMETATARSAL;  Surgeon: Harden Jerona GAILS, MD;  Location: [facility] OR;  Service: Orthopedics;  Laterality: Left;   Social History   Occupational History   Not on file  Tobacco Use   Smoking status: Every Day    Current packs/day: 0.50    Average packs/day: 0.5 packs/day for 44.0 years (22.0 ttl pk-yrs)    Types: Cigarettes   Smokeless tobacco: Never  Vaping Use   Vaping status: Never Used  Substance and Sexual Activity   Alcohol use: Yes    Alcohol/week: 0.0 standard drinks of alcohol    Comment: 08/13/2014 I haven't drank in a month or so; if I drink it would be beer on a warm day   Drug use: No   Sexual activity: Not Currently

## 2024-03-12 DIAGNOSIS — M6281 Muscle weakness (generalized): Secondary | ICD-10-CM | POA: Diagnosis not present

## 2024-03-12 DIAGNOSIS — R2689 Other abnormalities of gait and mobility: Secondary | ICD-10-CM | POA: Diagnosis not present

## 2024-03-13 DIAGNOSIS — R2689 Other abnormalities of gait and mobility: Secondary | ICD-10-CM | POA: Diagnosis not present

## 2024-03-13 DIAGNOSIS — M6281 Muscle weakness (generalized): Secondary | ICD-10-CM | POA: Diagnosis not present

## 2024-03-14 DIAGNOSIS — R2689 Other abnormalities of gait and mobility: Secondary | ICD-10-CM | POA: Diagnosis not present

## 2024-03-14 DIAGNOSIS — M6281 Muscle weakness (generalized): Secondary | ICD-10-CM | POA: Diagnosis not present

## 2024-03-14 DIAGNOSIS — Z4781 Encounter for orthopedic aftercare following surgical amputation: Secondary | ICD-10-CM | POA: Diagnosis not present

## 2024-03-17 DIAGNOSIS — Z4781 Encounter for orthopedic aftercare following surgical amputation: Secondary | ICD-10-CM | POA: Diagnosis not present

## 2024-03-17 DIAGNOSIS — R2689 Other abnormalities of gait and mobility: Secondary | ICD-10-CM | POA: Diagnosis not present

## 2024-03-18 DIAGNOSIS — M6281 Muscle weakness (generalized): Secondary | ICD-10-CM | POA: Diagnosis not present

## 2024-03-18 DIAGNOSIS — R2689 Other abnormalities of gait and mobility: Secondary | ICD-10-CM | POA: Diagnosis not present

## 2024-03-19 DIAGNOSIS — Z4781 Encounter for orthopedic aftercare following surgical amputation: Secondary | ICD-10-CM | POA: Diagnosis not present

## 2024-03-19 DIAGNOSIS — R2689 Other abnormalities of gait and mobility: Secondary | ICD-10-CM | POA: Diagnosis not present

## 2024-03-19 DIAGNOSIS — M6281 Muscle weakness (generalized): Secondary | ICD-10-CM | POA: Diagnosis not present

## 2024-03-20 DIAGNOSIS — R52 Pain, unspecified: Secondary | ICD-10-CM | POA: Diagnosis not present

## 2024-03-20 DIAGNOSIS — I739 Peripheral vascular disease, unspecified: Secondary | ICD-10-CM | POA: Diagnosis not present

## 2024-03-20 DIAGNOSIS — R2689 Other abnormalities of gait and mobility: Secondary | ICD-10-CM | POA: Diagnosis not present

## 2024-03-20 DIAGNOSIS — Z79899 Other long term (current) drug therapy: Secondary | ICD-10-CM | POA: Diagnosis not present

## 2024-03-20 DIAGNOSIS — Z89432 Acquired absence of left foot: Secondary | ICD-10-CM | POA: Diagnosis not present

## 2024-03-20 DIAGNOSIS — M6281 Muscle weakness (generalized): Secondary | ICD-10-CM | POA: Diagnosis not present

## 2024-03-21 DIAGNOSIS — R2689 Other abnormalities of gait and mobility: Secondary | ICD-10-CM | POA: Diagnosis not present

## 2024-03-21 DIAGNOSIS — Z4781 Encounter for orthopedic aftercare following surgical amputation: Secondary | ICD-10-CM | POA: Diagnosis not present

## 2024-03-21 DIAGNOSIS — M6281 Muscle weakness (generalized): Secondary | ICD-10-CM | POA: Diagnosis not present

## 2024-03-24 DIAGNOSIS — R2689 Other abnormalities of gait and mobility: Secondary | ICD-10-CM | POA: Diagnosis not present

## 2024-03-24 DIAGNOSIS — Z5181 Encounter for therapeutic drug level monitoring: Secondary | ICD-10-CM | POA: Diagnosis not present

## 2024-03-24 DIAGNOSIS — I2510 Atherosclerotic heart disease of native coronary artery without angina pectoris: Secondary | ICD-10-CM | POA: Diagnosis not present

## 2024-03-24 DIAGNOSIS — Z79899 Other long term (current) drug therapy: Secondary | ICD-10-CM | POA: Diagnosis not present

## 2024-03-24 DIAGNOSIS — E7849 Other hyperlipidemia: Secondary | ICD-10-CM | POA: Diagnosis not present

## 2024-03-24 DIAGNOSIS — Z4781 Encounter for orthopedic aftercare following surgical amputation: Secondary | ICD-10-CM | POA: Diagnosis not present

## 2024-03-25 DIAGNOSIS — R2689 Other abnormalities of gait and mobility: Secondary | ICD-10-CM | POA: Diagnosis not present

## 2024-03-25 DIAGNOSIS — M6281 Muscle weakness (generalized): Secondary | ICD-10-CM | POA: Diagnosis not present

## 2024-03-26 DIAGNOSIS — M6281 Muscle weakness (generalized): Secondary | ICD-10-CM | POA: Diagnosis not present

## 2024-03-26 DIAGNOSIS — R2689 Other abnormalities of gait and mobility: Secondary | ICD-10-CM | POA: Diagnosis not present

## 2024-03-27 DIAGNOSIS — Z4781 Encounter for orthopedic aftercare following surgical amputation: Secondary | ICD-10-CM | POA: Diagnosis not present

## 2024-03-27 DIAGNOSIS — M6281 Muscle weakness (generalized): Secondary | ICD-10-CM | POA: Diagnosis not present

## 2024-03-27 DIAGNOSIS — I10 Essential (primary) hypertension: Secondary | ICD-10-CM | POA: Diagnosis not present

## 2024-03-27 DIAGNOSIS — R2689 Other abnormalities of gait and mobility: Secondary | ICD-10-CM | POA: Diagnosis not present

## 2024-03-27 DIAGNOSIS — I739 Peripheral vascular disease, unspecified: Secondary | ICD-10-CM | POA: Diagnosis not present

## 2024-03-27 DIAGNOSIS — Z79899 Other long term (current) drug therapy: Secondary | ICD-10-CM | POA: Diagnosis not present

## 2024-03-28 DIAGNOSIS — Z4781 Encounter for orthopedic aftercare following surgical amputation: Secondary | ICD-10-CM | POA: Diagnosis not present

## 2024-03-28 DIAGNOSIS — R2689 Other abnormalities of gait and mobility: Secondary | ICD-10-CM | POA: Diagnosis not present

## 2024-03-28 DIAGNOSIS — M6281 Muscle weakness (generalized): Secondary | ICD-10-CM | POA: Diagnosis not present

## 2024-03-28 DIAGNOSIS — Z89422 Acquired absence of other left toe(s): Secondary | ICD-10-CM | POA: Diagnosis not present

## 2024-03-31 DIAGNOSIS — M6281 Muscle weakness (generalized): Secondary | ICD-10-CM | POA: Diagnosis not present

## 2024-03-31 DIAGNOSIS — Z4781 Encounter for orthopedic aftercare following surgical amputation: Secondary | ICD-10-CM | POA: Diagnosis not present

## 2024-03-31 DIAGNOSIS — E785 Hyperlipidemia, unspecified: Secondary | ICD-10-CM | POA: Diagnosis not present

## 2024-03-31 DIAGNOSIS — I10 Essential (primary) hypertension: Secondary | ICD-10-CM | POA: Diagnosis not present

## 2024-03-31 DIAGNOSIS — Z79899 Other long term (current) drug therapy: Secondary | ICD-10-CM | POA: Diagnosis not present

## 2024-03-31 DIAGNOSIS — J449 Chronic obstructive pulmonary disease, unspecified: Secondary | ICD-10-CM | POA: Diagnosis not present

## 2024-03-31 DIAGNOSIS — R2689 Other abnormalities of gait and mobility: Secondary | ICD-10-CM | POA: Diagnosis not present

## 2024-03-31 DIAGNOSIS — K2990 Gastroduodenitis, unspecified, without bleeding: Secondary | ICD-10-CM | POA: Diagnosis not present

## 2024-04-01 DIAGNOSIS — Z79899 Other long term (current) drug therapy: Secondary | ICD-10-CM | POA: Diagnosis not present

## 2024-04-01 DIAGNOSIS — Z4781 Encounter for orthopedic aftercare following surgical amputation: Secondary | ICD-10-CM | POA: Diagnosis not present

## 2024-04-01 DIAGNOSIS — M6281 Muscle weakness (generalized): Secondary | ICD-10-CM | POA: Diagnosis not present

## 2024-04-01 DIAGNOSIS — Z5181 Encounter for therapeutic drug level monitoring: Secondary | ICD-10-CM | POA: Diagnosis not present

## 2024-04-01 DIAGNOSIS — I2510 Atherosclerotic heart disease of native coronary artery without angina pectoris: Secondary | ICD-10-CM | POA: Diagnosis not present

## 2024-04-01 DIAGNOSIS — R2689 Other abnormalities of gait and mobility: Secondary | ICD-10-CM | POA: Diagnosis not present

## 2024-04-02 DIAGNOSIS — R2689 Other abnormalities of gait and mobility: Secondary | ICD-10-CM | POA: Diagnosis not present

## 2024-04-02 DIAGNOSIS — M6281 Muscle weakness (generalized): Secondary | ICD-10-CM | POA: Diagnosis not present

## 2024-04-02 DIAGNOSIS — Z4781 Encounter for orthopedic aftercare following surgical amputation: Secondary | ICD-10-CM | POA: Diagnosis not present

## 2024-04-03 DIAGNOSIS — M6281 Muscle weakness (generalized): Secondary | ICD-10-CM | POA: Diagnosis not present

## 2024-04-03 DIAGNOSIS — Z4781 Encounter for orthopedic aftercare following surgical amputation: Secondary | ICD-10-CM | POA: Diagnosis not present

## 2024-04-03 DIAGNOSIS — R2689 Other abnormalities of gait and mobility: Secondary | ICD-10-CM | POA: Diagnosis not present

## 2024-04-04 DIAGNOSIS — M6281 Muscle weakness (generalized): Secondary | ICD-10-CM | POA: Diagnosis not present

## 2024-04-04 DIAGNOSIS — R2689 Other abnormalities of gait and mobility: Secondary | ICD-10-CM | POA: Diagnosis not present

## 2024-04-07 DIAGNOSIS — M6281 Muscle weakness (generalized): Secondary | ICD-10-CM | POA: Diagnosis not present

## 2024-04-07 DIAGNOSIS — R2689 Other abnormalities of gait and mobility: Secondary | ICD-10-CM | POA: Diagnosis not present

## 2024-04-09 DIAGNOSIS — R2689 Other abnormalities of gait and mobility: Secondary | ICD-10-CM | POA: Diagnosis not present

## 2024-04-09 DIAGNOSIS — Z5181 Encounter for therapeutic drug level monitoring: Secondary | ICD-10-CM | POA: Diagnosis not present

## 2024-04-09 DIAGNOSIS — I2510 Atherosclerotic heart disease of native coronary artery without angina pectoris: Secondary | ICD-10-CM | POA: Diagnosis not present

## 2024-04-09 DIAGNOSIS — Z4781 Encounter for orthopedic aftercare following surgical amputation: Secondary | ICD-10-CM | POA: Diagnosis not present

## 2024-04-09 DIAGNOSIS — M6281 Muscle weakness (generalized): Secondary | ICD-10-CM | POA: Diagnosis not present

## 2024-04-10 DIAGNOSIS — R2689 Other abnormalities of gait and mobility: Secondary | ICD-10-CM | POA: Diagnosis not present

## 2024-04-10 DIAGNOSIS — M6281 Muscle weakness (generalized): Secondary | ICD-10-CM | POA: Diagnosis not present

## 2024-04-10 DIAGNOSIS — Z4781 Encounter for orthopedic aftercare following surgical amputation: Secondary | ICD-10-CM | POA: Diagnosis not present

## 2024-04-11 DIAGNOSIS — M6281 Muscle weakness (generalized): Secondary | ICD-10-CM | POA: Diagnosis not present

## 2024-04-11 DIAGNOSIS — R2689 Other abnormalities of gait and mobility: Secondary | ICD-10-CM | POA: Diagnosis not present

## 2024-04-11 DIAGNOSIS — Z4781 Encounter for orthopedic aftercare following surgical amputation: Secondary | ICD-10-CM | POA: Diagnosis not present

## 2024-04-16 DIAGNOSIS — I739 Peripheral vascular disease, unspecified: Secondary | ICD-10-CM | POA: Diagnosis not present

## 2024-04-16 DIAGNOSIS — J449 Chronic obstructive pulmonary disease, unspecified: Secondary | ICD-10-CM | POA: Diagnosis not present

## 2024-04-16 DIAGNOSIS — M86172 Other acute osteomyelitis, left ankle and foot: Secondary | ICD-10-CM | POA: Diagnosis not present

## 2024-04-16 DIAGNOSIS — Z87891 Personal history of nicotine dependence: Secondary | ICD-10-CM | POA: Diagnosis not present

## 2024-04-16 DIAGNOSIS — E785 Hyperlipidemia, unspecified: Secondary | ICD-10-CM | POA: Diagnosis not present

## 2024-04-16 DIAGNOSIS — I429 Cardiomyopathy, unspecified: Secondary | ICD-10-CM | POA: Diagnosis not present

## 2024-04-16 DIAGNOSIS — I10 Essential (primary) hypertension: Secondary | ICD-10-CM | POA: Diagnosis not present

## 2024-04-16 DIAGNOSIS — E559 Vitamin D deficiency, unspecified: Secondary | ICD-10-CM | POA: Diagnosis not present

## 2024-04-16 DIAGNOSIS — Z89432 Acquired absence of left foot: Secondary | ICD-10-CM | POA: Diagnosis not present

## 2024-06-02 DIAGNOSIS — M86172 Other acute osteomyelitis, left ankle and foot: Secondary | ICD-10-CM | POA: Diagnosis not present

## 2024-06-02 DIAGNOSIS — E559 Vitamin D deficiency, unspecified: Secondary | ICD-10-CM | POA: Diagnosis not present

## 2024-06-02 DIAGNOSIS — I2510 Atherosclerotic heart disease of native coronary artery without angina pectoris: Secondary | ICD-10-CM | POA: Diagnosis not present

## 2024-06-02 DIAGNOSIS — F1011 Alcohol abuse, in remission: Secondary | ICD-10-CM | POA: Diagnosis not present

## 2024-06-02 DIAGNOSIS — J449 Chronic obstructive pulmonary disease, unspecified: Secondary | ICD-10-CM | POA: Diagnosis not present

## 2024-06-02 DIAGNOSIS — E785 Hyperlipidemia, unspecified: Secondary | ICD-10-CM | POA: Diagnosis not present

## 2024-06-02 DIAGNOSIS — I429 Cardiomyopathy, unspecified: Secondary | ICD-10-CM | POA: Diagnosis not present

## 2024-06-02 DIAGNOSIS — I739 Peripheral vascular disease, unspecified: Secondary | ICD-10-CM | POA: Diagnosis not present

## 2024-06-02 DIAGNOSIS — I10 Essential (primary) hypertension: Secondary | ICD-10-CM | POA: Diagnosis not present

## 2024-06-09 ENCOUNTER — Telehealth: Payer: Self-pay

## 2024-06-09 DIAGNOSIS — E785 Hyperlipidemia, unspecified: Secondary | ICD-10-CM

## 2024-06-09 DIAGNOSIS — I700 Atherosclerosis of aorta: Secondary | ICD-10-CM

## 2024-06-09 NOTE — Transitions of Care (Post Inpatient/ED Visit) (Signed)
   06/09/2024  Name: Jason Spencer MRN: [id number] DOB: 1950/08/15  Today's TOC FU Call Status: Today's TOC FU Call Status:: Unsuccessful Call (1st Attempt) Unsuccessful Call (1st Attempt) Date: 06/09/24  Attempted to reach the patient regarding the most recent Inpatient/ED visit.  Follow Up Plan: Additional outreach attempts will be made to reach the patient to complete the Transitions of Care (Post Inpatient/ED visit) call.   Signature Avelina Essex, CMA (AAMA)  [facility] [phone]

## 2024-06-09 NOTE — Transitions of Care (Post Inpatient/ED Visit) (Signed)
   06/09/2024  Name: Jason Spencer MRN: [id number] DOB: 1951-03-15  Today's TOC FU Call Status: Today's TOC FU Call Status:: Successful TOC FU Call Completed Unsuccessful Call (1st Attempt) Date: 06/09/24 [facility] FU Call Complete Date: 06/09/24  Patient's Name and Date of Birth confirmed. Name, DOB  Transition Care Management Follow-up Telephone Call Date of Discharge: 06/07/24 Discharge Facility: Other [facility]) Name of Other (Non[facility]) Discharge Facility: [facility] Type of Discharge: Inpatient Admission Primary Inpatient Discharge Diagnosis:: orthopedic aftercare following surgical amputation How have you been since you were released from the hospital?: Better Any questions or concerns?: No  Items Reviewed: Did you receive and understand the discharge instructions provided?: Yes Medications obtained,verified, and reconciled?: Yes (Medications Reviewed) Any new allergies since your discharge?: No Dietary orders reviewed?: NA Do you have support at home?: Yes People in Home [RPT]: other relative(s) Name of Support/Comfort Primary Source: Jackie-cousin  Medications Reviewed Today: Medications Reviewed Today     Reviewed by Lang Avelina PARAS, CMA (Certified Medical Assistant) on 06/09/24 at 1323  Med List Status: <None>   Medication Order Taking? Sig Documenting Provider Last Dose Status Informant  amLODipine  (NORVASC ) 5 MG tablet [id number] Yes TAKE ONE TABLET ONCE DAILY Lavell Lye A, FNP  Active Self  Budeson-Glycopyrrol-Formoterol  (BREZTRI  AEROSPHERE) 160-9-4.8 MCG/ACT AERO [id number] Yes Inhale 2 puffs into the lungs 2 (two) times daily. Lavell Lye A, FNP  Active Self  ELIQUIS 5 MG TABS tablet [id number] Yes Take 5 mg by mouth 2 (two) times daily. [provider]  Active   HYDROcodone -acetaminophen  (NORCO/VICODIN) 5-325 MG tablet [id number]  Take 1 tablet by mouth every 4 (four) hours as needed for moderate pain (pain score 4-6).   Patient not taking: Reported on 06/09/2024   Zamora, Erin R, NP  Active   metoprolol  succinate (TOPROL -XL) 100 MG 24 hr tablet [id number] Yes TAKE 1 TABLET WITH OR IMMEDIATELY FOLLOWING A MEAL Hawks, Christy A, FNP  Active Self  rosuvastatin  (CRESTOR ) 20 MG tablet [id number] Yes Take 20 mg by mouth at bedtime. [provider]  Active    Patient not taking:   Discontinued 06/09/24 1322 (Change in therapy) VASELINE PURE ULTRA WHITE ointment [id number] Yes Apply 1 application  topically as needed (for psoriasis- affected areas). [provider]  Active Self            Home Care and Equipment/Supplies: Were Home Health Services Ordered?: NA Any new equipment or medical supplies ordered?: NA  Functional Questionnaire: Do you need assistance with bathing/showering or dressing?: No Do you need assistance with meal preparation?: No Do you need assistance with eating?: No Do you have difficulty maintaining continence: No Do you need assistance with getting out of bed/getting out of a chair/moving?: No Do you have difficulty managing or taking your medications?: No  Follow up appointments reviewed: PCP Follow-up appointment confirmed?: Yes Date of PCP follow-up appointment?: 06/20/24 Follow-up Provider: Lye Lavell, FNP Do you need transportation to your follow-up appointment?: No Do you understand care options if your condition(s) worsen?: Yes-patient verbalized understanding    SIGNATURE Avelina Lang, CMA (AAMA)  [facility] [phone]

## 2024-06-10 ENCOUNTER — Telehealth: Payer: Self-pay | Admitting: Family

## 2024-06-10 NOTE — Telephone Encounter (Signed)
 FYI for PCP   Copied from CRM #[id number]. Topic: Clinical - Home Health Verbal Orders >> Jun 10, 2024  8:55 AM Carrielelia G wrote: Caller/Agency: Mitzie Gaba [facility] Callback Number: [phone] Service Requested: Physical Therapy   Any new concerns about the patient? Yes Patient has refused home health services, patient states he is doing fine.  Patient states he is walking with a cane.  Mitzie stated that she informed patient Carmickle that if he needs any future services to contact his PCP first.

## 2024-06-10 NOTE — Telephone Encounter (Signed)
 Ok, please schedule this patient a follow up with me. He is overdue for his INR and chronic follow up.

## 2024-06-10 NOTE — Telephone Encounter (Signed)
Patient has appt made

## 2024-06-13 ENCOUNTER — Ambulatory Visit: Payer: Self-pay | Admitting: Family

## 2024-06-13 MED ORDER — ELIQUIS 5 MG PO TABS: 5.0000 mg | ORAL_TABLET | Freq: Two times a day (BID) | ORAL | 0 refills | Status: DC

## 2024-06-13 NOTE — Telephone Encounter (Signed)
 Patient states that he sees his PCP Bari Learn on Friday Patient states just one pill left for tonight & maybe one for in the morning and then he will be out of medication Patient has no other further concerns or symptoms to report and he states he feels fine Patient would like a call back on if a refill will be sent in and if it is sent in he would like it to go to: [facility]. [address] [place], [place]   Patient is advised to call us  back with any further questions/concerns He verbalized understanding and would like a call back from the office   FYI Only or Action Required?: Action required by provider: requesting refill of Eliquis .  Patient was last seen in primary care on 09/21/2023 by Learn Bari LABOR, FNP.  Called Nurse Triage reporting Medication Refill.  Triage Disposition: Call PCP Now  Patient/caregiver understands and will follow disposition?: Yes           Message from Deaijah H sent at 06/13/2024  1:24 PM EST  Reason for Triage: Patients friend Adrien called stating patient is in need of Eliquis . Stated he has one more dose, will be out after tonight, called pharmacy and stated they said they sent something over to provider but no response. [phone] Rae) or [phone] prefer to call patient since she's not on DPR   Reason for Disposition  [1] Prescription refill request for ESSENTIAL medicine (i.e., likelihood of harm to patient if not taken) AND [2] triager unable to refill per department policy  Answer Assessment - Initial Assessment Questions Patient states that he sees his PCP Bari Learn on Friday Patient states just one pill left for tonight & maybe one for in the morning and then he will be out of medication Patient has no other further concerns or symptoms to report and he states he feels fine Patient would like a call back on if a refill will be sent in and if it is sent in he would like it to go to: [facility]. [address] [place], [place]   Patient is advised to call us  back with any further questions/concerns He verbalized understanding and would like a call back from the office  Protocols used: Medication Refill and Renewal Call-A-AH

## 2024-06-13 NOTE — Telephone Encounter (Signed)
 Called [facility] and spoke with Lisa--advised her that the patient called requesting a refill of Eliquis  and only had two left----one for tonight and one for tomorrow

## 2024-06-13 NOTE — Telephone Encounter (Signed)
 Sent to covering provider since last rf was historical

## 2024-06-16 ENCOUNTER — Other Ambulatory Visit: Payer: Self-pay | Admitting: Family

## 2024-06-16 DIAGNOSIS — I4891 Unspecified atrial fibrillation: Secondary | ICD-10-CM

## 2024-06-16 DIAGNOSIS — J42 Unspecified chronic bronchitis: Secondary | ICD-10-CM

## 2024-06-16 DIAGNOSIS — I10 Essential (primary) hypertension: Secondary | ICD-10-CM

## 2024-06-16 DIAGNOSIS — I2510 Atherosclerotic heart disease of native coronary artery without angina pectoris: Secondary | ICD-10-CM

## 2024-06-16 NOTE — Telephone Encounter (Signed)
 Asprin not on current list.

## 2024-06-16 NOTE — Telephone Encounter (Unsigned)
 Copied from CRM [id number]. Topic: Clinical - Medication Refill >> Jun 16, 2024  9:13 AM Rosaria A wrote: Medication:  ELIQUIS  5 MG TABS tablet  amLODipine  (NORVASC ) 5 MG tablet  rosuvastatin  (CRESTOR ) 20 MG tablet  metoprolol  succinate (TOPROL -XL) 100 MG 24 hr tablet  Budeson-Glycopyrrol-Formoterol  (BREZTRI  AEROSPHERE) 160-9-4.8 MCG/ACT AERO asprin ec tablet release 81 mg  Has the patient contacted their pharmacy? No   This is the patient's preferred pharmacy:  [facility] [place], [place] - [address] [place] [address] Phone: [phone] Fax: [phone]   Is this the correct pharmacy for this prescription? Yes If no, delete pharmacy and type the correct one.   Has the prescription been filled recently? No  Is the patient out of the medication? Yes  Has the patient been seen for an appointment in the last year OR does the patient have an upcoming appointment? Yes  Can we respond through MyChart? No  Agent: Please be advised that Rx refills may take up to 3 business days. We ask that you follow-up with your pharmacy.

## 2024-06-17 MED ORDER — METOPROLOL SUCCINATE ER 100 MG PO TB24: ORAL_TABLET | ORAL | 0 refills | Status: DC

## 2024-06-17 MED ORDER — ROSUVASTATIN CALCIUM 20 MG PO TABS: 20.0000 mg | ORAL_TABLET | Freq: Every day | ORAL | 0 refills | Status: DC

## 2024-06-17 MED ORDER — AMLODIPINE BESYLATE 5 MG PO TABS: 5.0000 mg | ORAL_TABLET | Freq: Every day | ORAL | 0 refills | Status: DC

## 2024-06-17 MED ORDER — BREZTRI AEROSPHERE 160-9-4.8 MCG/ACT IN AERO: 2.0000 | INHALATION_SPRAY | Freq: Two times a day (BID) | RESPIRATORY_TRACT | 0 refills | Status: DC

## 2024-06-17 MED ORDER — ELIQUIS 5 MG PO TABS: 5.0000 mg | ORAL_TABLET | Freq: Two times a day (BID) | ORAL | 0 refills | Status: DC

## 2024-06-17 NOTE — Telephone Encounter (Signed)
 Eliquis  refilled. Needs follow up to see me.

## 2024-06-17 NOTE — Telephone Encounter (Signed)
 Refills sent has appt

## 2024-06-17 NOTE — Telephone Encounter (Signed)
 Was done on 06/13/24 by covering provider but was set on phone in, will send in other encounter that came in yesterday.

## 2024-06-20 ENCOUNTER — Ambulatory Visit: Admitting: Family

## 2024-07-14 DIAGNOSIS — J42 Unspecified chronic bronchitis: Secondary | ICD-10-CM

## 2024-07-17 ENCOUNTER — Ambulatory Visit (HOSPITAL_COMMUNITY)

## 2024-07-17 ENCOUNTER — Ambulatory Visit

## 2024-07-21 ENCOUNTER — Ambulatory Visit: Admitting: Family

## 2024-07-21 ENCOUNTER — Encounter: Payer: Self-pay | Admitting: Family

## 2024-07-21 VITALS — BP 147/69 | HR 65 | Temp 97.4°F | Ht 70.0 in | Wt 216.0 lb

## 2024-07-21 DIAGNOSIS — Z0000 Encounter for general adult medical examination without abnormal findings: Secondary | ICD-10-CM

## 2024-07-21 DIAGNOSIS — Z0001 Encounter for general adult medical examination with abnormal findings: Secondary | ICD-10-CM

## 2024-07-21 DIAGNOSIS — Z89432 Acquired absence of left foot: Secondary | ICD-10-CM

## 2024-07-21 DIAGNOSIS — I2510 Atherosclerotic heart disease of native coronary artery without angina pectoris: Secondary | ICD-10-CM | POA: Diagnosis not present

## 2024-07-21 DIAGNOSIS — J42 Unspecified chronic bronchitis: Secondary | ICD-10-CM

## 2024-07-21 DIAGNOSIS — I5023 Acute on chronic systolic (congestive) heart failure: Secondary | ICD-10-CM

## 2024-07-21 DIAGNOSIS — I429 Cardiomyopathy, unspecified: Secondary | ICD-10-CM | POA: Diagnosis not present

## 2024-07-21 DIAGNOSIS — I10 Essential (primary) hypertension: Secondary | ICD-10-CM | POA: Diagnosis not present

## 2024-07-21 DIAGNOSIS — E1169 Type 2 diabetes mellitus with other specified complication: Secondary | ICD-10-CM | POA: Diagnosis not present

## 2024-07-21 DIAGNOSIS — J410 Simple chronic bronchitis: Secondary | ICD-10-CM

## 2024-07-21 DIAGNOSIS — E785 Hyperlipidemia, unspecified: Secondary | ICD-10-CM

## 2024-07-21 DIAGNOSIS — I700 Atherosclerosis of aorta: Secondary | ICD-10-CM

## 2024-07-21 DIAGNOSIS — F1011 Alcohol abuse, in remission: Secondary | ICD-10-CM | POA: Diagnosis not present

## 2024-07-21 DIAGNOSIS — I4891 Unspecified atrial fibrillation: Secondary | ICD-10-CM

## 2024-07-21 DIAGNOSIS — M170 Bilateral primary osteoarthritis of knee: Secondary | ICD-10-CM

## 2024-07-21 DIAGNOSIS — I482 Chronic atrial fibrillation, unspecified: Secondary | ICD-10-CM | POA: Diagnosis not present

## 2024-07-21 DIAGNOSIS — F411 Generalized anxiety disorder: Secondary | ICD-10-CM

## 2024-07-21 DIAGNOSIS — Z720 Tobacco use: Secondary | ICD-10-CM

## 2024-07-21 LAB — BAYER DCA HB A1C WAIVED: HB A1C (BAYER DCA - WAIVED): 5.4 % (ref 4.8–5.6)

## 2024-07-21 LAB — LIPID PANEL

## 2024-07-21 MED ORDER — ROSUVASTATIN CALCIUM 20 MG PO TABS: 20.0000 mg | ORAL_TABLET | Freq: Every day | ORAL | 1 refills | Status: AC

## 2024-07-21 MED ORDER — ELIQUIS 5 MG PO TABS: 5.0000 mg | ORAL_TABLET | Freq: Two times a day (BID) | ORAL | 0 refills | Status: AC

## 2024-07-21 MED ORDER — AMLODIPINE BESYLATE 5 MG PO TABS: 5.0000 mg | ORAL_TABLET | Freq: Every day | ORAL | 0 refills | Status: AC

## 2024-07-21 MED ORDER — BREZTRI AEROSPHERE 160-9-4.8 MCG/ACT IN AERO: 2.0000 | INHALATION_SPRAY | Freq: Two times a day (BID) | RESPIRATORY_TRACT | 3 refills | Status: AC

## 2024-07-21 MED ORDER — METOPROLOL SUCCINATE ER 100 MG PO TB24: ORAL_TABLET | ORAL | 0 refills | Status: AC

## 2024-07-21 MED ORDER — HYDROCODONE-ACETAMINOPHEN 5-325 MG PO TABS: 1.0000 | ORAL_TABLET | ORAL | 0 refills | Status: AC | PRN

## 2024-07-21 NOTE — Progress Notes (Signed)
 "  Subjective:    Patient ID: Jason Spencer, male    DOB: [birth year], 74 y.o.   MRN: [id number]  Chief Complaint  Patient presents with   Hospitalization Follow-up   Pt presents to the office today for CPE and hospital follow up.   He went to the ED on 10/21/23 with osteomyelitis left of 4th and 5th toes. He had a left TMA on 10/31/23. He was discharged from the hospital to [facility] on 11/06/23. He was discharged from [facility] 06/06/24. His warfarin was changed to Eliquis  for his A Fib.    He does have a hx of alcohol abuse and states he has not drank since May 23, 2023.   He is followed by Cardiologists for A Fib, CAD, and Cardiomyopathy.    He has COPD and smokes a 1/4 pack. He has intermittent SOB and wheezing. He is using in Breztril BID.    He has atherosclerosis of aorta and takes Crestor  5 mg daily.    Hypertension This is a chronic problem. The current episode started more than 1 year ago. The problem has been waxing and waning since onset. The problem is uncontrolled. Associated symptoms include anxiety. Pertinent negatives include no blurred vision, malaise/fatigue, peripheral edema or shortness of breath. Risk factors for coronary artery disease include dyslipidemia, male gender, sedentary lifestyle and smoking/tobacco exposure. The current treatment provides moderate improvement. Hypertensive end-organ damage includes heart failure.  Congestive Heart Failure Presents for follow-up visit. Associated symptoms include edema. Pertinent negatives include no fatigue or shortness of breath. The symptoms have been stable.  Hyperlipidemia This is a chronic problem. The current episode started more than 1 year ago. The problem is controlled. Recent lipid tests were reviewed and are normal. Exacerbating diseases include obesity. Factors aggravating his hyperlipidemia include smoking. Pertinent negatives include no shortness of breath. Current antihyperlipidemic treatment includes diet  change and statins. The current treatment provides moderate improvement of lipids. Risk factors for coronary artery disease include dyslipidemia, hypertension, male sex and a sedentary lifestyle.  Anxiety Presents for follow-up visit. Symptoms include excessive worry and nervous/anxious behavior. Patient reports no shortness of breath. Symptoms occur occasionally. The severity of symptoms is mild.    Diabetes He presents for his follow-up diabetic visit. He has type 2 diabetes mellitus. Hypoglycemia symptoms include nervousness/anxiousness. Pertinent negatives for diabetes include no blurred vision, no fatigue and no foot paresthesias. Symptoms are stable. Risk factors for coronary artery disease include diabetes mellitus, dyslipidemia, male sex and hypertension. (Does not check glucose at this time)  Arthritis Presents for follow-up visit. He complains of pain. He reports no stiffness. Affected locations include the left knee and right knee. His pain is at a severity of 6/10. Pertinent negatives include no fatigue.      Review of Systems  Constitutional:  Negative for fatigue and malaise/fatigue.  Eyes:  Negative for blurred vision.  Respiratory:  Negative for shortness of breath.   Musculoskeletal:  Positive for arthritis. Negative for stiffness.  Psychiatric/Behavioral:  The patient is nervous/anxious.   All other systems reviewed and are negative.  Family History  Problem Relation Age of Onset   Heart disease Mother        VALUE REPLACEMENT   COPD Father    Aneurysm Father    Social History   Socioeconomic History   Marital status: Single    Spouse name: Not on file   Number of children: Not on file   Years of education: Not on file  Highest education level: Not on file  Occupational History   Not on file  Tobacco Use   Smoking status: Every Day    Current packs/day: 0.50    Average packs/day: 0.5 packs/day for 44.0 years (22.0 ttl pk-yrs)    Types: Cigarettes    Smokeless tobacco: Never  Vaping Use   Vaping status: Never Used  Substance and Sexual Activity   Alcohol use: Yes    Alcohol/week: 0.0 standard drinks of alcohol    Comment: 08/13/2014 I haven't drank in a month or so; if I drink it would be beer on a warm day   Drug use: No   Sexual activity: Not Currently  Other Topics Concern   Not on file  Social History Narrative   Not on file   Social Drivers of Health   Tobacco Use: High Risk (07/21/2024)   Patient History    Smoking Tobacco Use: Every Day    Smokeless Tobacco Use: Never    Passive Exposure: Not on file  Financial Resource Strain: Low Risk (01/08/2023)   Overall Financial Resource Strain (CARDIA)    Difficulty of Paying Living Expenses: Not hard at all  Food Insecurity: No Food Insecurity (10/21/2023)   Hunger Vital Sign    Worried About Running Out of Food in the Last Year: Never true    Ran Out of Food in the Last Year: Never true  Transportation Needs: No Transportation Needs (10/21/2023)   PRAPARE - Administrator, Civil Service (Medical): No    Lack of Transportation (Non-Medical): No  Physical Activity: Insufficiently Active (01/08/2023)   Exercise Vital Sign    Days of Exercise per Week: 2 days    Minutes of Exercise per Session: 20 min  Stress: Not on file  Social Connections: Moderately Integrated (10/22/2023)   Social Connection and Isolation Panel    Frequency of Communication with Friends and Family: Three times a week    Frequency of Social Gatherings with Friends and Family: Three times a week    Attends Religious Services: 1 to 4 times per year    Active Member of Clubs or Organizations: No    Attends Banker Meetings: 1 to 4 times per year    Marital Status: Never married  Depression (PHQ2-9): Low Risk (07/21/2024)   Depression (PHQ2-9)    PHQ-2 Score: 0  Alcohol Screen: Not on file  Housing: Low Risk (10/21/2023)   Housing Stability Vital Sign    Unable to Pay for Housing in  the Last Year: No    Number of Times Moved in the Last Year: 0    Homeless in the Last Year: No  Utilities: Not At Risk (10/21/2023)   AHC Utilities    Threatened with loss of utilities: No  Health Literacy: Not on file       Objective:   Physical Exam Vitals reviewed.  Constitutional:      General: He is not in acute distress.    Appearance: He is well-developed.  HENT:     Head: Normocephalic.     Right Ear: There is impacted cerumen.     Left Ear: Tympanic membrane normal.  Eyes:     General:        Right eye: No discharge.        Left eye: No discharge.     Pupils: Pupils are equal, round, and reactive to light.  Neck:     Thyroid : No thyromegaly.  Cardiovascular:  Rate and Rhythm: Normal rate and regular rhythm.     Heart sounds: Normal heart sounds. No murmur heard. Pulmonary:     Effort: Pulmonary effort is normal. No respiratory distress.     Breath sounds: Wheezing and rhonchi present.  Abdominal:     General: Bowel sounds are normal. There is no distension.     Palpations: Abdomen is soft.     Tenderness: There is no abdominal tenderness.  Musculoskeletal:        General: No tenderness. Normal range of motion.     Cervical back: Normal range of motion and neck supple.       Feet:     Left Lower Extremity: Left leg is amputated below ankle.  Feet:     Comments: Amputation of left toes Skin:    General: Skin is warm and dry.     Findings: No erythema or rash.  Neurological:     Mental Status: He is alert and oriented to person, place, and time.     Cranial Nerves: No cranial nerve deficit.     Motor: Weakness (generalized weakness, in wheelchair) present.     Deep Tendon Reflexes: Reflexes are normal and symmetric.  Psychiatric:        Behavior: Behavior normal.        Thought Content: Thought content normal.        Judgment: Judgment normal.      BP (!) 150/75   Pulse 65   Temp (!) 97.4 F (36.3 C) (Temporal)   Ht 5' 10 (1.778 m)   Wt 216  lb (98 kg)   SpO2 94%   BMI 30.99 kg/m       Assessment & Plan:  Chrstopher Malenfant comes in today with chief complaint of Hospitalization Follow-up   Diagnosis and orders addressed:  1. Annual physical exam (Primary) - Bayer DCA Hb A1c Waived - CMP14+EGFR - CBC with Differential/Platelet - Lipid panel - TSH - Vitamin B12 - Microalbumin / creatinine urine ratio  2. History of transmetatarsal amputation of left foot (HCC) - CMP14+EGFR - CBC with Differential/Platelet  3. Type 2 diabetes mellitus with other specified complication, without long-term current use of insulin (HCC) - Bayer DCA Hb A1c Waived - CMP14+EGFR - CBC with Differential/Platelet - TSH - Vitamin B12 - Microalbumin / creatinine urine ratio  4. Acute on chronic systolic CHF (congestive heart failure) (HCC) - CMP14+EGFR - CBC with Differential/Platelet  5. Simple chronic bronchitis (HCC) - CMP14+EGFR - CBC with Differential/Platelet  6. Chronic atrial fibrillation, unspecified (HCC) - CMP14+EGFR - CBC with Differential/Platelet  7. Essential hypertension, benign - amLODipine  (NORVASC ) 5 MG tablet; Take 1 tablet (5 mg total) by mouth daily.  Dispense: 90 tablet; Refill: 0 - metoprolol  succinate (TOPROL -XL) 100 MG 24 hr tablet; TAKE 1 TABLET WITH OR IMMEDIATELY FOLLOWING A MEAL  Dispense: 90 tablet; Refill: 0 - CMP14+EGFR - CBC with Differential/Platelet  8. Coronary artery disease involving native coronary artery of native heart without angina pectoris - amLODipine  (NORVASC ) 5 MG tablet; Take 1 tablet (5 mg total) by mouth daily.  Dispense: 90 tablet; Refill: 0 - CMP14+EGFR - CBC with Differential/Platelet  9. Atrial fibrillation, new onset w/ RVR - metoprolol  succinate (TOPROL -XL) 100 MG 24 hr tablet; TAKE 1 TABLET WITH OR IMMEDIATELY FOLLOWING A MEAL  Dispense: 90 tablet; Refill: 0 - CMP14+EGFR - CBC with Differential/Platelet  10. Chronic bronchitis, unspecified chronic bronchitis type (HCC) -  budesonide -glycopyrrolate -formoterol  (BREZTRI  AEROSPHERE) 160-9-4.8 MCG/ACT AERO inhaler; Inhale 2 puffs into  the lungs in the morning and at bedtime.  Dispense: 10.7 g; Refill: 3 - CMP14+EGFR - CBC with Differential/Platelet  11. Alcohol abuse, in remission - CMP14+EGFR - CBC with Differential/Platelet  12. Atherosclerosis of aorta - CMP14+EGFR - CBC with Differential/Platelet  13. Cardiomyopathy, unspecified type (HCC) - CMP14+EGFR - CBC with Differential/Platelet  14. GAD (generalized anxiety disorder) - CMP14+EGFR - CBC with Differential/Platelet  15. Hyperlipidemia, unspecified hyperlipidemia type - CMP14+EGFR - CBC with Differential/Platelet - Lipid panel  16. Tobacco abuse - CMP14+EGFR - CBC with Differential/Platelet  17. Primary osteoarthritis of both knees - HYDROcodone -acetaminophen  (NORCO/VICODIN) 5-325 MG tablet; Take 1 tablet by mouth every 4 (four) hours as needed for moderate pain (pain score 4-6).  Dispense: 30 tablet; Refill: 0 - CMP14+EGFR  Labs pending Continue current medications  Will give one more rx of Norco, use only when necessary  Health Maintenance reviewed Diet and exercise encouraged  Follow up plan: 1 month for HTN   Bari Learn, FNP  "

## 2024-07-21 NOTE — Patient Instructions (Signed)

## 2024-07-22 ENCOUNTER — Ambulatory Visit: Payer: Self-pay | Admitting: Family

## 2024-07-22 LAB — CMP14+EGFR
ALT: 10 IU/L (ref 0–44)
AST: 19 IU/L (ref 0–40)
Albumin: 3.9 g/dL (ref 3.8–4.8)
Alkaline Phosphatase: 75 IU/L (ref 47–123)
BUN/Creatinine Ratio: 7 — AB (ref 10–24)
BUN: 8 mg/dL (ref 8–27)
Bilirubin Total: 0.6 mg/dL (ref 0.0–1.2)
CO2: 22 mmol/L (ref 20–29)
Calcium: 9.3 mg/dL (ref 8.6–10.2)
Chloride: 101 mmol/L (ref 96–106)
Creatinine, Ser: 1.12 mg/dL (ref 0.76–1.27)
Globulin, Total: 3.6 g/dL (ref 1.5–4.5)
Glucose: 93 mg/dL (ref 70–99)
Potassium: 4.3 mmol/L (ref 3.5–5.2)
Sodium: 136 mmol/L (ref 134–144)
Total Protein: 7.5 g/dL (ref 6.0–8.5)
eGFR: 70 mL/min/1.73

## 2024-07-22 LAB — CBC WITH DIFFERENTIAL/PLATELET
Basophils Absolute: 0 x10E3/uL (ref 0.0–0.2)
Basos: 0 %
EOS (ABSOLUTE): 0.2 x10E3/uL (ref 0.0–0.4)
Eos: 3 %
Hematocrit: 44.3 % (ref 37.5–51.0)
Hemoglobin: 14.5 g/dL (ref 13.0–17.7)
Immature Grans (Abs): 0.1 x10E3/uL (ref 0.0–0.1)
Immature Granulocytes: 1 %
Lymphocytes Absolute: 1.1 x10E3/uL (ref 0.7–3.1)
Lymphs: 14 %
MCH: 30.8 pg (ref 26.6–33.0)
MCHC: 32.7 g/dL (ref 31.5–35.7)
MCV: 94 fL (ref 79–97)
Monocytes Absolute: 1.1 x10E3/uL — ABNORMAL HIGH (ref 0.1–0.9)
Monocytes: 14 %
Neutrophils Absolute: 5.4 x10E3/uL (ref 1.4–7.0)
Neutrophils: 68 %
Platelets: 231 x10E3/uL (ref 150–450)
RBC: 4.71 x10E6/uL (ref 4.14–5.80)
RDW: 12.8 % (ref 11.6–15.4)
WBC: 7.9 x10E3/uL (ref 3.4–10.8)

## 2024-07-22 LAB — LIPID PANEL
Cholesterol, Total: 129 mg/dL (ref 100–199)
HDL: 42 mg/dL
LDL CALC COMMENT:: 3.1 ratio (ref 0.0–5.0)
LDL Chol Calc (NIH): 54 mg/dL (ref 0–99)
Triglycerides: 206 mg/dL — AB (ref 0–149)
VLDL Cholesterol Cal: 33 mg/dL (ref 5–40)

## 2024-07-22 LAB — TSH: TSH: 3.56 u[IU]/mL (ref 0.450–4.500)

## 2024-07-22 LAB — MICROALBUMIN / CREATININE URINE RATIO
Creatinine, Urine: 102.7 mg/dL
Microalb/Creat Ratio: 16 mg/g{creat} (ref 0–29)
Microalbumin, Urine: 16.1 ug/mL

## 2024-07-22 LAB — VITAMIN B12: Vitamin B-12: 275 pg/mL (ref 232–1245)

## 2024-07-31 ENCOUNTER — Ambulatory Visit (HOSPITAL_COMMUNITY)
Admission: RE | Admit: 2024-07-31 | Discharge: 2024-07-31 | Disposition: A | Discharge status: Home / Self Care | Source: Ambulatory Visit | Attending: Vascular Surgery | Admitting: Vascular Surgery

## 2024-07-31 ENCOUNTER — Ambulatory Visit: Admitting: Physician Assistant

## 2024-07-31 ENCOUNTER — Ambulatory Visit (HOSPITAL_BASED_OUTPATIENT_CLINIC_OR_DEPARTMENT_OTHER)
Admission: RE | Admit: 2024-07-31 | Discharge: 2024-07-31 | Disposition: A | Discharge status: Home / Self Care | Source: Ambulatory Visit | Attending: Vascular Surgery | Admitting: Vascular Surgery

## 2024-07-31 VITALS — BP 145/80 | HR 81 | Temp 98.1°F | Resp 24 | Ht 70.0 in | Wt 216.0 lb

## 2024-07-31 DIAGNOSIS — I739 Peripheral vascular disease, unspecified: Secondary | ICD-10-CM

## 2024-07-31 LAB — VAS US ABI WITH/WO TBI

## 2024-07-31 NOTE — Progress Notes (Signed)
 "   Office Note   History of Present Illness   Jason Spencer is a 74 y.o. (11/04/1950) male who presents for follow-up.  He has a history of left lower extremity angiogram with left SFA and above-knee popliteal artery shockwave lithotripsy, left popliteal artery drug-coated balloon angioplasty, and angioplasty and stenting of the left SFA and above-knee popliteal artery on 10/25/2023 by Dr. Gretta.  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  He subsequently underwent left TMA by Dr. Harden on 10/31/2023.  He returns today for follow-up.  He has no complaints at today's office visit.  His left TMA is completely healed.  He does report a small area of callusing at the lateral aspect of his TMA, without open wounds.  He says that this callused area has been present for a couple of months.  He denies any other tissue loss.  He denies any rest pain or claudication.  Current Outpatient Medications  Medication Sig Dispense Refill   amLODipine  (NORVASC ) 5 MG tablet Take 1 tablet (5 mg total) by mouth daily. 90 tablet 0   budesonide -glycopyrrolate -formoterol  (BREZTRI  AEROSPHERE) 160-9-4.8 MCG/ACT AERO inhaler Inhale 2 puffs into the lungs in the morning and at bedtime. 10.7 g 3   ELIQUIS  5 MG TABS tablet Take 1 tablet (5 mg total) by mouth 2 (two) times daily. 180 tablet 0   HYDROcodone -acetaminophen  (NORCO/VICODIN) 5-325 MG tablet Take 1 tablet by mouth every 4 (four) hours as needed for moderate pain (pain score 4-6). 30 tablet 0   metoprolol  succinate (TOPROL -XL) 100 MG 24 hr tablet TAKE 1 TABLET WITH OR IMMEDIATELY FOLLOWING A MEAL 90 tablet 0   rosuvastatin  (CRESTOR ) 20 MG tablet Take 1 tablet (20 mg total) by mouth at bedtime. 90 tablet 1   VASELINE PURE ULTRA WHITE ointment Apply 1 application  topically as needed (for psoriasis- affected areas).     No current facility-administered medications for this visit.    REVIEW OF SYSTEMS (negative unless checked):   Cardiac:  []  Chest  pain or chest pressure? []  Shortness of breath upon activity? []  Shortness of breath when lying flat? []  Irregular heart rhythm?  Vascular:  []  Pain in calf, thigh, or hip brought on by walking? []  Pain in feet at night that wakes you up from your sleep? []  Blood clot in your veins? []  Leg swelling?  Pulmonary:  []  Oxygen at home? []  Productive cough? []  Wheezing?  Neurologic:  []  Sudden weakness in arms or legs? []  Sudden numbness in arms or legs? []  Sudden onset of difficult speaking or slurred speech? []  Temporary loss of vision in one eye? []  Problems with dizziness?  Gastrointestinal:  []  Blood in stool? []  Vomited blood?  Genitourinary:  []  Burning when urinating? []  Blood in urine?  Psychiatric:  []  Major depression  Hematologic:  []  Bleeding problems? []  Problems with blood clotting?  Dermatologic:  []  Rashes or ulcers?  Constitutional:  []  Fever or chills?  Ear/Nose/Throat:  []  Change in hearing? []  Nose bleeds? []  Sore throat?  Musculoskeletal:  []  Back pain? []  Joint pain? []  Muscle pain?   Physical Examination   Vitals:   07/31/24 1426  BP: (!) 145/80  Pulse: 81  Resp: (!) 24  Temp: 98.1 F (36.7 C)  TempSrc: Temporal  SpO2: 94%  Weight: 216 lb (98 kg)  Height: 5' 10 (1.778 m)   Body mass index is 30.99 kg/m.  General:  WDWN in NAD; vital signs documented above Gait: Not observed HENT:  WNL, normocephalic Pulmonary: normal non-labored breathing , Cardiac: regular Abdomen: soft, NT, no masses Skin: without rashes Vascular Exam/Pulses: Monophasic right PT Doppler signal.  Brisk triphasic left DP Doppler signal Extremities: Well-healed left TMA with small area of callus at the lateral aspect Musculoskeletal: no muscle wasting or atrophy  Neurologic: A&O X 3;  No focal weakness or paresthesias are detected Psychiatric:  The pt has normal affect  Non-Invasive Vascular imaging   ABI (07/31/2024) R:  ABI: [place] ([place]),  PT:  mono DP: none TBI:  73 L:  ABI: [place] ([place]),  PT: none DP: tri TBI: amp   BLE Arterial Duplex (07/31/2024) 30 to 49% stenosis of the proximal right EIA.  50 to 74% stenosis of the distal right SFA.  No visualized stenosis in the left lower extremity.  Patent left proximal SFA to above-knee popliteal artery stent without stenosis  Medical Decision Making   Jason Spencer is a 74 y.o. male who presents for surveillance of PAD  Based on the patient's vascular studies, his ABIs remain noncompressible bilaterally.  He has triphasic flow in his left DP Arterial duplex demonstrates 30 to 49% stenosis of the proximal right EIA.  There is also 50 to 74% stenosis of the distal right SFA.  He has no visualized stenosis in the left lower extremity and a patent left SFA to above-knee popliteal artery stent without stenosis He denies any rest pain, tissue loss, or claudication.  His left TMA is well-healed.  There is an area of callusing, which he states has been present for several months.  There are no open wounds to this area.  He denies any drainage from this area. On exam he has a monophasic right PT Doppler signal and brisk left triphasic DP Doppler signal I have encouraged the patient to continue taking his medications.  He can follow-up with our office in 6 months with repeat bilateral lower extremity arterial duplex and ABIs  Ahmed Holster PA-C [facility] [place] Office: [phone]  Call MD: Sheree "

## 2024-08-01 ENCOUNTER — Other Ambulatory Visit: Payer: Self-pay

## 2024-08-01 DIAGNOSIS — I739 Peripheral vascular disease, unspecified: Secondary | ICD-10-CM

## 2024-08-21 ENCOUNTER — Ambulatory Visit: Admitting: Family

## 2025-01-22 ENCOUNTER — Ambulatory Visit (HOSPITAL_COMMUNITY)

## 2025-01-22 ENCOUNTER — Ambulatory Visit
